# Patient Record
Sex: Female | Born: 1955
Health system: Southern US, Community
[De-identification: ages and names within clinical notes are randomized; demographics above are authoritative.]

## PROBLEM LIST (undated history)

## (undated) DIAGNOSIS — I5042 Chronic combined systolic (congestive) and diastolic (congestive) heart failure: Secondary | ICD-10-CM

## (undated) DIAGNOSIS — Z9109 Other allergy status, other than to drugs and biological substances: Secondary | ICD-10-CM

## (undated) DIAGNOSIS — J309 Allergic rhinitis, unspecified: Secondary | ICD-10-CM

## (undated) DIAGNOSIS — F419 Anxiety disorder, unspecified: Secondary | ICD-10-CM

## (undated) DIAGNOSIS — E785 Hyperlipidemia, unspecified: Secondary | ICD-10-CM

## (undated) DIAGNOSIS — Z95810 Presence of automatic (implantable) cardiac defibrillator: Secondary | ICD-10-CM

## (undated) DIAGNOSIS — F32A Depression, unspecified: Secondary | ICD-10-CM

## (undated) DIAGNOSIS — I447 Left bundle-branch block, unspecified: Secondary | ICD-10-CM

## (undated) DIAGNOSIS — F338 Other recurrent depressive disorders: Secondary | ICD-10-CM

## (undated) DIAGNOSIS — I428 Other cardiomyopathies: Secondary | ICD-10-CM

## (undated) DIAGNOSIS — G4733 Obstructive sleep apnea (adult) (pediatric): Secondary | ICD-10-CM

## (undated) DIAGNOSIS — F329 Major depressive disorder, single episode, unspecified: Secondary | ICD-10-CM

## (undated) DIAGNOSIS — B019 Varicella without complication: Secondary | ICD-10-CM

## (undated) DIAGNOSIS — F988 Other specified behavioral and emotional disorders with onset usually occurring in childhood and adolescence: Secondary | ICD-10-CM

## (undated) DIAGNOSIS — I509 Heart failure, unspecified: Secondary | ICD-10-CM

## (undated) DIAGNOSIS — Z8639 Personal history of other endocrine, nutritional and metabolic disease: Secondary | ICD-10-CM

## (undated) HISTORY — DX: Major depressive disorder, single episode, unspecified: F32.9

## (undated) HISTORY — DX: Other cardiomyopathies: I42.8

## (undated) HISTORY — DX: Obstructive sleep apnea (adult) (pediatric): G47.33

## (undated) HISTORY — DX: Varicella without complication: B01.9

## (undated) HISTORY — DX: Other allergy status, other than to drugs and biological substances: Z91.09

## (undated) HISTORY — DX: Allergic rhinitis, unspecified: J30.9

## (undated) HISTORY — PX: CARDIAC CATHETERIZATION: SHX172

## (undated) HISTORY — DX: Left bundle-branch block, unspecified: I44.7

## (undated) HISTORY — PX: TUBAL LIGATION: SHX77

## (undated) HISTORY — DX: Other recurrent depressive disorders: F33.8

## (undated) HISTORY — DX: Chronic combined systolic (congestive) and diastolic (congestive) heart failure: I50.42

## (undated) HISTORY — DX: Personal history of other endocrine, nutritional and metabolic disease: Z86.39

## (undated) HISTORY — DX: Other specified behavioral and emotional disorders with onset usually occurring in childhood and adolescence: F98.8

## (undated) HISTORY — DX: Depression, unspecified: F32.A

## (undated) HISTORY — DX: Hyperlipidemia, unspecified: E78.5

---

## 1984-10-02 HISTORY — PX: WISDOM TOOTH EXTRACTION: SHX21

## 1998-07-26 ENCOUNTER — Emergency Department (HOSPITAL_COMMUNITY): Admission: EM | Admit: 1998-07-26 | Discharge: 1998-07-26 | Payer: Self-pay

## 1998-07-27 ENCOUNTER — Inpatient Hospital Stay (HOSPITAL_COMMUNITY): Admission: AD | Admit: 1998-07-27 | Discharge: 1998-08-02 | Payer: Self-pay | Admitting: *Deleted

## 1998-08-03 ENCOUNTER — Encounter (HOSPITAL_COMMUNITY): Admission: RE | Admit: 1998-08-03 | Discharge: 1998-11-01 | Payer: Self-pay | Admitting: Internal Medicine

## 2001-10-02 HISTORY — PX: DILATION AND CURETTAGE OF UTERUS: SHX78

## 2002-10-02 HISTORY — PX: TOTAL ABDOMINAL HYSTERECTOMY: SHX209

## 2013-09-05 ENCOUNTER — Encounter: Payer: Self-pay | Admitting: Physician Assistant

## 2013-09-05 ENCOUNTER — Ambulatory Visit (INDEPENDENT_AMBULATORY_CARE_PROVIDER_SITE_OTHER): Payer: Self-pay | Admitting: Physician Assistant

## 2013-09-05 VITALS — BP 108/78 | HR 79 | Temp 98.5°F | Resp 16 | Ht 65.5 in | Wt 206.8 lb

## 2013-09-05 DIAGNOSIS — Z0000 Encounter for general adult medical examination without abnormal findings: Secondary | ICD-10-CM

## 2013-09-05 DIAGNOSIS — F988 Other specified behavioral and emotional disorders with onset usually occurring in childhood and adolescence: Secondary | ICD-10-CM

## 2013-09-05 DIAGNOSIS — Z9109 Other allergy status, other than to drugs and biological substances: Secondary | ICD-10-CM

## 2013-09-05 MED ORDER — LISDEXAMFETAMINE DIMESYLATE 40 MG PO CAPS: 40.0000 mg | ORAL_CAPSULE | ORAL | Status: DC

## 2013-09-05 MED ORDER — MOMETASONE FUROATE 50 MCG/ACT NA SUSP: 2.0000 | Freq: Every day | NASAL | Status: DC

## 2013-09-05 MED ORDER — AMPHETAMINE-DEXTROAMPHETAMINE 10 MG PO TABS: 10.0000 mg | ORAL_TABLET | Freq: Every day | ORAL | Status: DC

## 2013-09-05 NOTE — Progress Notes (Signed)
Pre visit review using our clinic review tool, if applicable. No additional management support is needed unless otherwise documented below in the visit note/SLS  

## 2013-09-05 NOTE — Patient Instructions (Signed)
Please return for lab work.  I will call you with all of your results.  You will be contacted to set up a mammogram.  I would encourage you to reconsider having a colonoscopy.  It is a painless procedure and is a great screening tool to help detect precancerous lesions in the colon.  Colonoscopy A colonoscopy is an exam to evaluate your entire colon. In this exam, your colon is cleansed. A long fiberoptic tube is inserted through your rectum and into your colon. The fiberoptic scope (endoscope) is a long bundle of enclosed and very flexible fibers. These fibers transmit light to the area examined and send images from that area to your caregiver. Discomfort is usually minimal. You may be given a drug to help you sleep (sedative) during or prior to the procedure. This exam helps to detect lumps (tumors), polyps, inflammation, and areas of bleeding. Your caregiver may also take a small piece of tissue (biopsy) that will be examined under a microscope. LET YOUR CAREGIVER KNOW ABOUT:   Allergies to food or medicine.  Medicines taken, including vitamins, herbs, eyedrops, over-the-counter medicines, and creams.  Use of steroids (by mouth or creams).  Previous problems with anesthetics or numbing medicines.  History of bleeding problems or blood clots.  Previous surgery.  Other health problems, including diabetes and kidney problems.  Possibility of pregnancy, if this applies. BEFORE THE PROCEDURE   A clear liquid diet may be required for 2 days before the exam.  Ask your caregiver about changing or stopping your regular medications.  Liquid injections (enemas) or laxatives may be required.  A large amount of electrolyte solution may be given to you to drink over a short period of time. This solution is used to clean out your colon.  You should be present 60 minutes prior to your procedure or as directed by your caregiver. AFTER THE PROCEDURE   If you received a sedative or pain relieving  medication, you will need to arrange for someone to drive you home.  Occasionally, there is a little blood passed with the first bowel movement. Do not be concerned. FINDING OUT THE RESULTS OF YOUR TEST Not all test results are available during your visit. If your test results are not back during the visit, make an appointment with your caregiver to find out the results. Do not assume everything is normal if you have not heard from your caregiver or the medical facility. It is important for you to follow up on all of your test results. HOME CARE INSTRUCTIONS   It is not unusual to pass moderate amounts of gas and experience mild abdominal cramping following the procedure. This is due to air being used to inflate your colon during the exam. Walking or a warm pack on your belly (abdomen) may help.  You may resume all normal meals and activities after sedatives and medicines have worn off.  Only take over-the-counter or prescription medicines for pain, discomfort, or fever as directed by your caregiver. Do not use aspirin or blood thinners if a biopsy was taken. Consult your caregiver for medicine usage if biopsies were taken. SEEK IMMEDIATE MEDICAL CARE IF:   You have a fever.  You pass large blood clots or fill a toilet with blood following the procedure. This may also occur 10 to 14 days following the procedure. This is more likely if a biopsy was taken.  You develop abdominal pain that keeps getting worse and cannot be relieved with medicine. Document Released: 09/15/2000 Document  Revised: 12/11/2011 Document Reviewed: 04/21/2013 [facility] Patient Information 2014 [place], [place].

## 2013-09-05 NOTE — Progress Notes (Signed)
Patient ID: Debra Holloway, female   DOB: 10/01/1957, 57 y.o.   MRN: [id number]  Patient presents to clinic today to establish care.  Acute Concerns: Patient requesting medication refill.  Chronic Issues: (1) ADD --  patient endorses symptoms are controlled with Diovan 40 mg QAM and Adderall 10 mg QPM.  States she only takes medication during the work week. Takes holiday on weekends. Patient denies palpitations, chest pain or shortness of breath. Denies decrease in appetite while medications.  (2) Environmental Allergies -- patient also endorses history of environmental allergies for which she takes Nasonex and Singulair daily.  Endorses good relief of symptoms.  Health Maintenance: Dental -- UTD Vision -- UTD Immunizations -- Declines flu shot.  UTD tetanus.  Mammogram -- overdue.  Needs mammogram scheduled PAP -- TAH. No history of abnormal PAP smears Colonoscopy -- overdue.  Discussed flexible sigmoidoscopy.  Past Medical History  Diagnosis Date  . ADD (attention deficit disorder)   . Environmental allergies   . Chicken pox     No current outpatient prescriptions on file prior to visit.   No current facility-administered medications on file prior to visit.    Allergies  Allergen Reactions  . Bee Venom Anaphylaxis  . Erythromycin Itching and Rash  . Penicillins Hives, Itching and Rash    Family History  Problem Relation Age of Onset  . COPD Mother     Living  . Pancreatic cancer Father 23    Deceased  . Lung cancer Paternal Grandfather   . Heart disease Paternal Grandmother   . Hypertension Paternal Grandmother   . Alzheimer's disease Maternal Grandfather   . Arthritis/Rheumatoid Maternal Grandmother   . Heart disease Maternal Grandmother     Tachycardia  . Cancer Paternal Uncle   . Breast cancer Sister 41  . Healthy Brother     x3  . Healthy Daughter     x3    History   Social History  . Marital Status: Legally Separated    Spouse Name: N/A    Number  of Children: N/A  . Years of Education: N/A   Social History Main Topics  . Smoking status: Never Smoker   . Smokeless tobacco: Never Used  . Alcohol Use: 2.4 oz/week    4 Glasses of wine per week  . Drug Use: No  . Sexual Activity: None   Other Topics Concern  . None   Social History Narrative  . None   Review of Systems  Constitutional: Negative for fever and weight loss.  HENT: Negative for ear discharge, ear pain, hearing loss and tinnitus.   Eyes: Negative for blurred vision, double vision, photophobia and pain.  Respiratory: Negative for cough and shortness of breath.   Cardiovascular: Negative for chest pain.  Gastrointestinal: Negative for heartburn, nausea, vomiting, abdominal pain, diarrhea, constipation, blood in stool and melena.  Genitourinary: Negative for dysuria, urgency, frequency, hematuria and flank pain.  Musculoskeletal: Negative for joint pain and myalgias.  Neurological: Negative for dizziness, seizures, loss of consciousness and headaches.  Endo/Heme/Allergies: Negative for environmental allergies.  Psychiatric/Behavioral: Negative for depression, suicidal ideas, hallucinations and substance abuse. The patient is not nervous/anxious and does not have insomnia.        Situational depression -- 1998   Filed Vitals:   09/05/13 0835  BP: 108/78  Pulse: 79  Temp: 98.5 F (36.9 C)  Resp: 16    Physical Exam  Vitals reviewed. Constitutional: She is oriented to person, place, and time and well-developed, well-nourished, and  in no distress.  HENT:  Head: Normocephalic and atraumatic.  Right Ear: External ear normal.  Left Ear: External ear normal.  Nose: Nose normal.  Mouth/Throat: Oropharynx is clear and moist. No oropharyngeal exudate.  Tympanic membranes within normal limits bilaterally.  Eyes: Conjunctivae and EOM are normal. Pupils are equal, round, and reactive to light.  Neck: Normal range of motion. Neck supple. No thyromegaly present.   Cardiovascular: Normal rate, regular rhythm, normal heart sounds and intact distal pulses.   Pulmonary/Chest: Effort normal and breath sounds normal. No respiratory distress. She has no wheezes. She has no rales. She exhibits no tenderness.  Abdominal: Soft. Bowel sounds are normal. She exhibits no distension and no mass. There is no tenderness. There is no rebound and no guarding.  Lymphadenopathy:    She has no cervical adenopathy.  Neurological: She is alert and oriented to person, place, and time. No cranial nerve deficit.  Skin: Skin is warm and dry. No rash noted.  Psychiatric: Affect normal.   Assessment/Plan: Visit for preventive health examination Medical history reviewed personally. Patient declines flu shot. Up-to-date on tetanus.  Discussed need for colonoscopy. Patient refuses. Patient given handout on colonoscopy to review.  We'll readdress need for screening at next visit. Patient due for a screening mammogram. Positive family history of breast cancer.  Patient with history of abdominal hysterectomy including cervix. No history of cervical cancer. No need for Pap smear at today's visit.  Patient to return for fasting labs.  Environmental allergies Refill medications.  ADD (attention deficit disorder) Medications refill. Patient signed controlled substance contract.

## 2013-09-06 DIAGNOSIS — Z9109 Other allergy status, other than to drugs and biological substances: Secondary | ICD-10-CM | POA: Insufficient documentation

## 2013-09-06 DIAGNOSIS — F988 Other specified behavioral and emotional disorders with onset usually occurring in childhood and adolescence: Secondary | ICD-10-CM | POA: Insufficient documentation

## 2013-09-06 DIAGNOSIS — F909 Attention-deficit hyperactivity disorder, unspecified type: Secondary | ICD-10-CM | POA: Insufficient documentation

## 2013-09-06 DIAGNOSIS — Z0000 Encounter for general adult medical examination without abnormal findings: Secondary | ICD-10-CM | POA: Insufficient documentation

## 2013-09-06 NOTE — Assessment & Plan Note (Signed)
Refill medications

## 2013-09-06 NOTE — Assessment & Plan Note (Signed)
Medications refill. Patient signed controlled substance contract.

## 2013-09-06 NOTE — Assessment & Plan Note (Addendum)
Medical history reviewed personally. Patient declines flu shot. Up-to-date on tetanus.  Discussed need for colonoscopy. Patient refuses. Patient given handout on colonoscopy to review.  We'll readdress need for screening at next visit. Patient due for a screening mammogram. Positive family history of breast cancer.  Patient with history of abdominal hysterectomy including cervix. No history of cervical cancer. No need for Pap smear at today's visit.  Patient to return for fasting labs.

## 2013-09-10 ENCOUNTER — Ambulatory Visit (HOSPITAL_BASED_OUTPATIENT_CLINIC_OR_DEPARTMENT_OTHER): Payer: Self-pay

## 2013-09-22 ENCOUNTER — Telehealth: Payer: Self-pay | Admitting: Physician Assistant

## 2013-09-22 NOTE — Telephone Encounter (Signed)
Received medical records from [facility]. [place]  P: [phone] F: [phone]

## 2013-10-06 ENCOUNTER — Telehealth: Payer: Self-pay | Admitting: Physician Assistant

## 2013-10-06 DIAGNOSIS — Z0000 Encounter for general adult medical examination without abnormal findings: Secondary | ICD-10-CM

## 2013-10-06 MED ORDER — AMPHETAMINE-DEXTROAMPHETAMINE 10 MG PO TABS: 10.0000 mg | ORAL_TABLET | Freq: Every day | ORAL | Status: DC

## 2013-10-06 NOTE — Telephone Encounter (Signed)
Please advise Adderall refill?  Last RX was done on 09-05-13 quantity 30 with 0 refills

## 2013-10-06 NOTE — Telephone Encounter (Signed)
LMOM with contact name and number RE: Requested Rx ready for pick-up/SLS

## 2013-10-22 ENCOUNTER — Telehealth: Payer: Self-pay | Admitting: Physician Assistant

## 2013-10-22 NOTE — Telephone Encounter (Signed)
PA form received for Vyvanse 40mg , form forward to nurse

## 2013-10-27 NOTE — Telephone Encounter (Signed)
Patient called in wanting to know about this.

## 2013-10-27 NOTE — Telephone Encounter (Signed)
Per provider this form was completed and faxed for approval last week/SLS

## 2013-10-30 NOTE — Telephone Encounter (Signed)
Received Approval for pt's Vyvanse; forward fax to [facility] [phone]; Patient Unavailable, LM with mother/SLS

## 2013-10-30 NOTE — Telephone Encounter (Signed)
Pharmacy called for status on PA-No response from Insurance on PA request from 01.21.15; resubmitted 'Second Request' fax for PA approval ; LMOM to inform pt/pharmacy of status/SLS

## 2013-11-06 ENCOUNTER — Other Ambulatory Visit: Payer: Self-pay | Admitting: Physician Assistant

## 2013-11-06 DIAGNOSIS — Z0000 Encounter for general adult medical examination without abnormal findings: Secondary | ICD-10-CM

## 2013-11-06 MED ORDER — AMPHETAMINE-DEXTROAMPHETAMINE 10 MG PO TABS: 10.0000 mg | ORAL_TABLET | Freq: Every day | ORAL | Status: DC

## 2013-11-13 ENCOUNTER — Telehealth: Payer: Self-pay | Admitting: Physician Assistant

## 2013-11-13 NOTE — Telephone Encounter (Signed)
Received PA for D-Amphetamine Salt Combo, form forward to nurse

## 2013-11-21 NOTE — Telephone Encounter (Signed)
Received Approval from [employer]; fax to [employer]

## 2013-11-21 NOTE — Telephone Encounter (Signed)
PA Form faxed to [facility] for Approval/SLS

## 2013-12-01 ENCOUNTER — Telehealth: Payer: Self-pay | Admitting: Physician Assistant

## 2013-12-01 ENCOUNTER — Telehealth: Payer: Self-pay

## 2013-12-01 DIAGNOSIS — Z0000 Encounter for general adult medical examination without abnormal findings: Secondary | ICD-10-CM

## 2013-12-01 MED ORDER — LISDEXAMFETAMINE DIMESYLATE 40 MG PO CAPS: 40.0000 mg | ORAL_CAPSULE | ORAL | Status: DC

## 2013-12-01 NOTE — Telephone Encounter (Signed)
Last Vyvanse refill in EMR 12.05.14, #30x0/SLS Please Advise.

## 2013-12-01 NOTE — Telephone Encounter (Signed)
Medication refill granted.  Rx printed, signed and given to nursing staff.  Ivin Booty and I will not be at [facility] until Wednesday.

## 2013-12-01 NOTE — Telephone Encounter (Signed)
LMOM with contact name and number [for return call, if needed] RE: Rx requested ready for p/u on Tuesday, 03.03.15 at [facility] office between 7a-7p/SLS

## 2013-12-01 NOTE — Telephone Encounter (Signed)
Patient left a message on my vm stating that she needs a refill on her Vyvanse. Pt stated you can reach her at work at [phone]

## 2013-12-18 ENCOUNTER — Telehealth: Payer: Self-pay | Admitting: Physician Assistant

## 2013-12-18 DIAGNOSIS — Z0000 Encounter for general adult medical examination without abnormal findings: Secondary | ICD-10-CM

## 2013-12-19 ENCOUNTER — Telehealth: Payer: Self-pay

## 2013-12-19 NOTE — Telephone Encounter (Signed)
Medication Detail      Disp Refills Start End     lisdexamfetamine (VYVANSE) 40 MG capsule 30 capsule 0 12/01/2013     Sig - Route: Take 1 capsule (40 mg total) by mouth every morning. - Oral    Class: Print       Patient picked up & signed for Vyvanse 40 mg Rx at front desk on 03.03.15; lmom with contact name & number to inform patient of this information/SLS

## 2013-12-19 NOTE — Telephone Encounter (Signed)
Pt left a message stating that she needs a refill on her vyvanse. Pt stated that she doesn't know why the mychart says march that she has an rx because she doesn't. Pt stated that another medication was mixed up awhile ago and maybe this is were the confusion is?  Please advise?

## 2013-12-19 NOTE — Telephone Encounter (Signed)
Pt left another (not nice) message on my vm stating that she doesn't want her vyvanse refilled she wants her Adderall refilled? Please advise?

## 2013-12-22 NOTE — Telephone Encounter (Signed)
Pt left another message stating that this is the 3rd time she has called and would appreciate a call back.  Per Sharon's phone call earlier to me, she is going to call pt back this afternoon.

## 2013-12-22 NOTE — Telephone Encounter (Signed)
LMOM with contact name and number for return call RE: Adderall scripts for February & March given together in February 05.2015; these were placed in one envelope and our Medication Log shows that you did sign for & p/u this envelope. We cannot give controlled medications early, and this is stated in the Controlled Substance Contract. Pt was informed to call back with any further information regarding this matter that we are unaware of; also, informed that I am out of office this week, but will be checking my notes and provider is out of office until Wed, 03.25.15/SLS  

## 2013-12-22 NOTE — Telephone Encounter (Signed)
I will call patient and explain that she was given Feb & March Rx scripts on her Adderall previously at same time & they were signed for/SLS

## 2013-12-22 NOTE — Telephone Encounter (Signed)
LMOM with contact name and number for return call RE: Adderall scripts for February & March given together in February 05.2015; these were placed in one envelope and our Medication Log shows that you did sign for & p/u this envelope. We cannot give controlled medications early, and this is stated in the Controlled Substance Contract. Pt was informed to call back with any further information regarding this matter that we are unaware of; also, informed that I am out of office this week, but will be checking my notes and provider is out of office until Wed, 03.25.15/SLS

## 2014-01-01 ENCOUNTER — Telehealth: Payer: Self-pay

## 2014-01-01 NOTE — Telephone Encounter (Signed)
Pt would like a refill on her vyvanse?

## 2014-01-05 ENCOUNTER — Other Ambulatory Visit: Payer: Self-pay | Admitting: Physician Assistant

## 2014-01-05 DIAGNOSIS — Z0000 Encounter for general adult medical examination without abnormal findings: Secondary | ICD-10-CM

## 2014-01-05 MED ORDER — LISDEXAMFETAMINE DIMESYLATE 40 MG PO CAPS: 40.0000 mg | ORAL_CAPSULE | ORAL | Status: DC

## 2014-01-05 NOTE — Telephone Encounter (Signed)
Last rx printed 12/01/13, #30.  Rx printed and forwarded to Provider for signature.

## 2014-01-05 NOTE — Telephone Encounter (Signed)
Rx sent to front desk for pick up. Left detailed message on pt's cell# that rx is ready for pick up and to call if any questions.

## 2014-01-08 NOTE — Telephone Encounter (Signed)
Medication Detail      Disp Refills Start End     lisdexamfetamine (VYVANSE) 40 MG capsule 30 capsule 0 01/05/2014     Sig - Route: Take 1 capsule (40 mg total) by mouth every morning. - Oral    Class: Print

## 2014-01-19 ENCOUNTER — Telehealth: Payer: Self-pay | Admitting: Physician Assistant

## 2014-01-19 MED ORDER — AMPHETAMINE-DEXTROAMPHETAMINE 10 MG PO TABS: 10.0000 mg | ORAL_TABLET | Freq: Every day | ORAL | Status: DC

## 2014-01-19 NOTE — Telephone Encounter (Signed)
Patient request for refill on Adderall 10 mg Last filled - 03.05.15, #30x0 Last AEX - 12.05.15 Next AEX - Needs OV prior to future refills per provider Refill printed  For 30-day only/sls Sent reply message to pt via My Chart to inform pt Rx ready for pick-up/SLS.

## 2014-01-21 ENCOUNTER — Ambulatory Visit (INDEPENDENT_AMBULATORY_CARE_PROVIDER_SITE_OTHER): Payer: 59 | Admitting: Physician Assistant

## 2014-01-21 ENCOUNTER — Telehealth: Payer: Self-pay | Admitting: Physician Assistant

## 2014-01-21 ENCOUNTER — Ambulatory Visit (HOSPITAL_BASED_OUTPATIENT_CLINIC_OR_DEPARTMENT_OTHER)
Admission: RE | Admit: 2014-01-21 | Discharge: 2014-01-21 | Disposition: A | Discharge status: Home / Self Care | Payer: 59 | Source: Ambulatory Visit | Attending: Physician Assistant | Admitting: Physician Assistant

## 2014-01-21 ENCOUNTER — Encounter: Payer: Self-pay | Admitting: Physician Assistant

## 2014-01-21 VITALS — BP 110/86 | HR 76 | Temp 98.5°F | Resp 16 | Ht 65.5 in | Wt 203.2 lb

## 2014-01-21 DIAGNOSIS — J069 Acute upper respiratory infection, unspecified: Secondary | ICD-10-CM | POA: Insufficient documentation

## 2014-01-21 DIAGNOSIS — B9789 Other viral agents as the cause of diseases classified elsewhere: Secondary | ICD-10-CM

## 2014-01-21 DIAGNOSIS — R0781 Pleurodynia: Secondary | ICD-10-CM | POA: Insufficient documentation

## 2014-01-21 DIAGNOSIS — R079 Chest pain, unspecified: Secondary | ICD-10-CM | POA: Insufficient documentation

## 2014-01-21 DIAGNOSIS — W19XXXA Unspecified fall, initial encounter: Secondary | ICD-10-CM | POA: Insufficient documentation

## 2014-01-21 DIAGNOSIS — S2239XA Fracture of one rib, unspecified side, initial encounter for closed fracture: Secondary | ICD-10-CM | POA: Insufficient documentation

## 2014-01-21 DIAGNOSIS — Z9109 Other allergy status, other than to drugs and biological substances: Secondary | ICD-10-CM

## 2014-01-21 MED ORDER — BENZONATATE 100 MG PO CAPS: 100.0000 mg | ORAL_CAPSULE | Freq: Two times a day (BID) | ORAL | Status: DC | PRN

## 2014-01-21 MED ORDER — MOMETASONE FUROATE 50 MCG/ACT NA SUSP: 2.0000 | Freq: Every day | NASAL | Status: DC

## 2014-01-21 MED ORDER — MONTELUKAST SODIUM 10 MG PO TABS: 10.0000 mg | ORAL_TABLET | Freq: Every day | ORAL | Status: DC

## 2014-01-21 NOTE — Assessment & Plan Note (Signed)
Medications refilled.  Take as directed.

## 2014-01-21 NOTE — Telephone Encounter (Signed)
PA Form completed & faxed for Approval to [facility] at [phone]/SLS

## 2014-01-21 NOTE — Progress Notes (Signed)
Patient presents to clinic today c/o sinus pressure and nasal congestion with nonproductive cough x 2 days.  Endorses some fatigue.  Denies sinus pain, ear pain, tooth pain, fever.  Has history of significant seasonal allergies and has been out of Nasonex and Singulair.  Patient also endorses having a fall in [employer] ~ 2 weeks ago, hitting her left side onto the leg of a turned-over chair. Denies trauma to head or other portion of body.  Denies LOC.  Denies LH, dizziness prior to fall.  Denies hx of seizures.  Has had residual left-sided rib pain that has worsened over the past 2 days due to her non-productive cough.  Past Medical History  Diagnosis Date  . ADD (attention deficit disorder)   . Environmental allergies   . Chicken pox     Current Outpatient Prescriptions on File Prior to Visit  Medication Sig Dispense Refill  . amphetamine-dextroamphetamine (ADDERALL) 10 MG tablet Take 1 tablet (10 mg total) by mouth daily. OFFICE VISIT NEEDED PRIOR TO FUTURE REFILLS  30 tablet  0  . lisdexamfetamine (VYVANSE) 40 MG capsule Take 1 capsule (40 mg total) by mouth every morning.  30 capsule  0   No current facility-administered medications on file prior to visit.    Allergies  Allergen Reactions  . Bee Venom Anaphylaxis  . Erythromycin Itching and Rash  . Penicillins Hives, Itching and Rash    Family History  Problem Relation Age of Onset  . COPD Mother     Living  . Pancreatic cancer Father 59    Deceased  . Lung cancer Paternal Grandfather   . Heart disease Paternal Grandmother   . Hypertension Paternal Grandmother   . Alzheimer's disease Maternal Grandfather   . Arthritis/Rheumatoid Maternal Grandmother   . Heart disease Maternal Grandmother     Tachycardia  . Cancer Paternal Uncle   . Breast cancer Sister 25  . Healthy Brother     x3  . Healthy Daughter     x3    History   Social History  . Marital Status: Legally Separated    Spouse Name: N/A    Number of  Children: N/A  . Years of Education: N/A   Social History Main Topics  . Smoking status: Never Smoker   . Smokeless tobacco: Never Used  . Alcohol Use: 2.4 oz/week    4 Glasses of wine per week  . Drug Use: No  . Sexual Activity: None   Other Topics Concern  . None   Social History Narrative  . None   Review of Systems - See HPI.  All other ROS are negative.  BP 110/86  Pulse 76  Temp(Src) 98.5 F (36.9 C) (Oral)  Resp 16  Ht 5' 5.5" (1.664 m)  Wt 203 lb 4 oz (92.194 kg)  BMI 33.30 kg/m2  SpO2 97%  Physical Exam  Vitals reviewed. Constitutional: She is oriented to person, place, and time and well-developed, well-nourished, and in no distress.  HENT:  Head: Normocephalic and atraumatic.  Right Ear: External ear normal.  Left Ear: External ear normal.  Nose: Nose normal.  Mouth/Throat: Oropharynx is clear and moist. No oropharyngeal exudate.  TM within normal limits bilaterally.  No TTP of sinuses noted on examination.  Eyes: Conjunctivae are normal. Pupils are equal, round, and reactive to light.  Neck: Neck supple.  Cardiovascular: Normal rate, regular rhythm, normal heart sounds and intact distal pulses.   Pulmonary/Chest: Effort normal and breath sounds normal. No respiratory distress.  She has no wheezes. She has no rales. She exhibits tenderness.  Lymphadenopathy:    She has no cervical adenopathy.  Neurological: She is alert and oriented to person, place, and time.  Skin: Skin is warm and dry. No rash noted.  Psychiatric: Affect normal.   No results found for this or any previous visit (from the past 2160 hour(s)).  Assessment/Plan: Environmental allergies Medications refilled.  Take as directed.  Viral URI with cough   Increase fluid intake.  Rest.  Saline nasal spray. Mucinex. Humidifier in bedroom. Resume allergy medications.  Rx Tessalon perles for cough  Please call or return to clinic if symptoms are not improving.   Rib pain on left side Will  obtain x-ray due to hx of fall and chest wall tenderness.  No palpable gross abnormality on exam.  Alternate tylenol and ibuprofen.  Topical Aspercreme.  Heating pad to affected area.

## 2014-01-21 NOTE — Telephone Encounter (Signed)
Patient seen in clinic today for viral URI and left-sided rib pain after sustaining fall 2 weeks ago.  X-ray reveals no cardiopulmonary disease, but noted mild fractures in 2 of her lower left ribs.  She should continue care as directed at today's visit.  See is she thinks she needs a stronger pain medications --I believed she mentioned codeine makes her nauseas when we were discussing cough syrups.  If she thinks she can tolerate oral pain pills, I can wirte her an Rx.  IF not, I can try to send in some lidocaine patches for her to apply topically.

## 2014-01-21 NOTE — Progress Notes (Signed)
Pre visit review using our clinic review tool, if applicable. No additional management support is needed unless otherwise documented below in the visit note/SLS  

## 2014-01-21 NOTE — Assessment & Plan Note (Signed)
Increase fluid intake.  Rest.  Saline nasal spray. Mucinex. Humidifier in bedroom. Resume allergy medications.  Rx Tessalon perles for cough  Please call or return to clinic if symptoms are not improving.

## 2014-01-21 NOTE — Patient Instructions (Signed)
For Rib Pain -- Please go downstairs to the 1st floor for Imaging.  I will call you with your results.  Alternate tylenol and ibuprofen if needed for pain.  Apply heat to area for 15 minutes, a few times per day.  You can also apply a topical Aspercreme or Icy Hot to the affected area  For Viral Upper Respiratory symptoms -- Increase fluid intake.  Resume your allergy medications as directed.  Take Tessalon Perles for cough.  Place a humidifier in the bedroom.  Symptoms should continue to improve daily.

## 2014-01-21 NOTE — Telephone Encounter (Signed)
Received form for PA of Nasonex, form forward to nurse

## 2014-01-21 NOTE — Telephone Encounter (Signed)
Requesting results of xray and needs refill on adderall

## 2014-01-21 NOTE — Assessment & Plan Note (Signed)
Will obtain x-ray due to hx of fall and chest wall tenderness.  No palpable gross abnormality on exam.  Alternate tylenol and ibuprofen.  Topical Aspercreme.  Heating pad to affected area.

## 2014-01-21 NOTE — Telephone Encounter (Signed)
Patient received Rx at OV this morning/SLS

## 2014-01-22 NOTE — Telephone Encounter (Signed)
Patient informed, understood & agreed, informed Ok to use Mucinex for expectorant; pt declined pain medication, provider informed/SLS

## 2014-01-22 NOTE — Telephone Encounter (Signed)
LMOM with contact name and number for return call RE: results & medication refill and further provider instructions/SLS

## 2014-01-22 NOTE — Telephone Encounter (Signed)
X-ray reveals fractures of lower left ribs.  These are nondisplaced and do not have a risk of harming her lungs.  This will heal over time.  Continue care as discussed at visit.  I know she mentioned a possible intolerance to codeine when we were discussing her cough.  My options for pain medication are limited.  I can prescribe her a lidocaine patch to help with the pain.  Or we can attempt something like Tramadol.

## 2014-01-22 NOTE — Telephone Encounter (Signed)
Please advise on Xray results/SLS

## 2014-02-02 ENCOUNTER — Telehealth: Payer: Self-pay

## 2014-02-02 NOTE — Telephone Encounter (Signed)
Rx was completed on January 19, 2014 & Office Visit was completed on January 21, 2014.

## 2014-02-02 NOTE — Telephone Encounter (Signed)
Patient left a message stating that she would like to have her RX for her Vyvanse and when she picked up her adderall RX it stated that she would need an appt for additional refills? Pt stated in message that she was told at her April appt that she wouldn't need another appt before the adderall could be refilled?  Please advise?

## 2014-02-04 ENCOUNTER — Telehealth: Payer: Self-pay | Admitting: Family Medicine

## 2014-02-04 DIAGNOSIS — Z0000 Encounter for general adult medical examination without abnormal findings: Secondary | ICD-10-CM

## 2014-02-04 MED ORDER — LISDEXAMFETAMINE DIMESYLATE 40 MG PO CAPS: 40.0000 mg | ORAL_CAPSULE | ORAL | Status: DC

## 2014-02-04 NOTE — Telephone Encounter (Signed)
Last Vyvanse rx printed 01/05/14. Printed Rx and forwarded to Provider for signature as pt would like to pick up rx today.

## 2014-02-09 ENCOUNTER — Telehealth: Payer: Self-pay | Admitting: Physician Assistant

## 2014-02-09 NOTE — Telephone Encounter (Signed)
This Rx was px on 04.22.2015/SLS Please Advise.

## 2014-02-09 NOTE — Telephone Encounter (Signed)
Refill.  No additional refill without follow-up visit.

## 2014-02-09 NOTE — Telephone Encounter (Signed)
Refill- benzonatate  [facility] at [address], [address] from pharmacy: This is currently on backorder. Patient received the first 10 tablets. Please call or fax in alternative.

## 2014-02-09 NOTE — Telephone Encounter (Signed)
LMOM with contact name and number for return call RE: Appointment needed per provider instructions/SLS

## 2014-02-09 NOTE — Telephone Encounter (Signed)
Patient was given 44-months Rx scripts for May, June & July 2015/SLS

## 2014-02-10 NOTE — Telephone Encounter (Signed)
Received message from pt that she has not requested refill on any medications. States the pharmacy was only able to give her a partial fill of the benzonatate from 01/21/14 as it was on backorder. Pt states she paid for the full Rx and had contacted pharmacy to get the remainder of it as she feels she has already paid for the full Rx.  Received message from Sam @ [facility] at 11:33am stating we should disregard previous fax re: benzonatate substitute. He states pt is not asking for refill or alternative at this time.

## 2014-02-13 ENCOUNTER — Encounter: Payer: Self-pay | Admitting: Physician Assistant

## 2014-02-18 ENCOUNTER — Telehealth: Payer: Self-pay | Admitting: *Deleted

## 2014-02-18 NOTE — Telephone Encounter (Signed)
Pt left message requesting refill of adderall.  Please advise.

## 2014-02-19 MED ORDER — AMPHETAMINE-DEXTROAMPHETAMINE 10 MG PO TABS: 10.0000 mg | ORAL_TABLET | Freq: Every day | ORAL | Status: DC

## 2014-02-19 NOTE — Telephone Encounter (Signed)
Three months of Rx for Adderall print, signed, & ready for p/u at front desk; patient informed, understood & agreed; reiterated office hours/SLS

## 2014-03-05 ENCOUNTER — Encounter: Payer: Self-pay | Admitting: Physician Assistant

## 2014-05-03 ENCOUNTER — Other Ambulatory Visit: Payer: Self-pay | Admitting: Physician Assistant

## 2014-05-03 DIAGNOSIS — Z0000 Encounter for general adult medical examination without abnormal findings: Secondary | ICD-10-CM

## 2014-05-04 MED ORDER — LISDEXAMFETAMINE DIMESYLATE 40 MG PO CAPS: 40.0000 mg | ORAL_CAPSULE | ORAL | Status: DC

## 2014-05-04 NOTE — Telephone Encounter (Signed)
lisdexamfetamine (VYVANSE) 40 MG capsule [[id number]]    Order Details     Dose: 40 mg Route: Oral Frequency: BH-each morning    Order Comments:       FILL ON OR AFTER April 06, 2014       Dispense Quantity: 30 capsule Refills: 0 Fills Remaining: 0            Sig: Take 1 capsule (40 mg total) by mouth every morning.           Written Date: 02/04/14 Expiration Date: 04/05/14      Start Date: 02/04/14 End Date: --      Ordering Provider: Leeanne Rio, [place] Authorizing Provider: Leeanne Rio, PA-C Ordering User: Rockwell Germany, CMA            Diagnosis Association: Visit for preventive health examination (V70.0)    Per VO provider, Ok to print Rx for patient; can fill On or After 08.08.15, per pharmacy patient last filled script on 07.09.15/SLS

## 2014-05-08 ENCOUNTER — Other Ambulatory Visit: Payer: Self-pay | Admitting: Physician Assistant

## 2014-05-08 DIAGNOSIS — Z0000 Encounter for general adult medical examination without abnormal findings: Secondary | ICD-10-CM

## 2014-05-08 MED ORDER — LISDEXAMFETAMINE DIMESYLATE 40 MG PO CAPS: 40.0000 mg | ORAL_CAPSULE | ORAL | Status: DC

## 2014-05-08 MED ORDER — LISDEXAMFETAMINE DIMESYLATE 40 MG PO CAPS
40.0000 mg | ORAL_CAPSULE | ORAL | Status: DC
Start: 2014-05-08 — End: 2014-05-08

## 2014-06-10 ENCOUNTER — Telehealth: Payer: Self-pay | Admitting: Physician Assistant

## 2014-06-10 NOTE — Telephone Encounter (Signed)
Patient called in stating that her brother was going to pick up her handwritten rx.

## 2014-06-11 ENCOUNTER — Other Ambulatory Visit: Payer: Self-pay | Admitting: Physician Assistant

## 2014-06-12 MED ORDER — AMPHETAMINE-DEXTROAMPHETAMINE 10 MG PO TABS: 10.0000 mg | ORAL_TABLET | Freq: Every day | ORAL | Status: DC

## 2014-06-19 ENCOUNTER — Encounter: Payer: Self-pay | Admitting: Physician Assistant

## 2014-06-19 ENCOUNTER — Ambulatory Visit (INDEPENDENT_AMBULATORY_CARE_PROVIDER_SITE_OTHER): Payer: 59 | Admitting: Physician Assistant

## 2014-06-19 VITALS — BP 120/84 | HR 64 | Temp 98.6°F | Ht 65.5 in | Wt 203.0 lb

## 2014-06-19 DIAGNOSIS — F988 Other specified behavioral and emotional disorders with onset usually occurring in childhood and adolescence: Secondary | ICD-10-CM

## 2014-06-19 NOTE — Patient Instructions (Signed)
Please continue your medications as directed.  You are due for a annual exam and fasting labs in December of this year.  Please read information below on mammograms and colonoscopies.  I would highly encourage you to let us set you up for these.  Colonoscopy A colonoscopy is an exam to look at the entire large intestine (colon). This exam can help find problems such as tumors, polyps, inflammation, and areas of bleeding. The exam takes about 1 hour.  LET [facility] CARE PROVIDER KNOW ABOUT:   Any allergies you have.  All medicines you are taking, including vitamins, herbs, eye drops, creams, and over-the-counter medicines.  Previous problems you or members of your family have had with the use of anesthetics.  Any blood disorders you have.  Previous surgeries you have had.  Medical conditions you have. RISKS AND COMPLICATIONS  Generally, this is a safe procedure. However, as with any procedure, complications can occur. Possible complications include:  Bleeding.  Tearing or rupture of the colon wall.  Reaction to medicines given during the exam.  Infection (rare). BEFORE THE PROCEDURE   Ask your health care provider about changing or stopping your regular medicines.  You may be prescribed an oral bowel prep. This involves drinking a large amount of medicated liquid, starting the day before your procedure. The liquid will cause you to have multiple loose stools until your stool is almost clear or light green. This cleans out your colon in preparation for the procedure.  Do not eat or drink anything else once you have started the bowel prep, unless your health care provider tells you it is safe to do so.  Arrange for someone to drive you home after the procedure. PROCEDURE   You will be given medicine to help you relax (sedative).  You will lie on your side with your knees bent.  A long, flexible tube with a light and camera on the end (colonoscope) will be inserted through the  rectum and into the colon. The camera sends video back to a computer screen as it moves through the colon. The colonoscope also releases carbon dioxide gas to inflate the colon. This helps your health care provider see the area better.  During the exam, your health care provider may take a small tissue sample (biopsy) to be examined under a microscope if any abnormalities are found.  The exam is finished when the entire colon has been viewed. AFTER THE PROCEDURE   Do not drive for 24 hours after the exam.  You may have a small amount of blood in your stool.  You may pass moderate amounts of gas and have mild abdominal cramping or bloating. This is caused by the gas used to inflate your colon during the exam.  Ask when your test results will be ready and how you will get your results. Make sure you get your test results. Document Released: 09/15/2000 Document Revised: 07/09/2013 Document Reviewed: 05/26/2013 [facility] Patient Information 2015 [place], [place]. This information is not intended to replace advice given to you by your health care provider. Make sure you discuss any questions you have with your health care provider.  Mammography  Mammography is an X-ray of the breasts. The X-ray image of the breast is called a mammogram. This test can:  Find changes in the breast that are not normal.  Look for early signs of cancer.  Find cancer.  Diagnose cancer. BEFORE THE PROCEDURE  Make your test about 7 days after your period (menses).  If you have a new doctor or clinic, send any past mammogram images to your new doctor's office.  Wash your breasts and under your arms the day of the test.  Do not wear deodorant, perfume, or powder on your body.  Wear clothes that you can change in and out of easily. PROCEDURE  Try to relax during the test.   You will get undressed from the waist up. You will put on a gown.  You will stand in front of the X-ray machine.  Each breast will be  placed between 2 plastic or glass plates. The plates will press down on your breast.  X-rays will be taken of the breast from different angles. The test should take less than 30 minutes. AFTER THE PROCEDURE  The X-ray image will be looked at carefully.  You may need to do certain parts of the test again if the images are unclear.  Ask when your test results will be ready. Get your test results.  You may go back to your normal activities. Document Released: 12/11/2011 Document Reviewed: 12/11/2011 [facility] Patient Information 2015 [place]. This information is not intended to replace advice given to you by your health care provider. Make sure you discuss any questions you have with your health care provider.

## 2014-06-19 NOTE — Progress Notes (Signed)
Patient presents to clinic today for medication management.  Patient currently on Adderall and Vyvanse for ADD.  Patient has been well controlled on current regimen.  Denies palpitations, chest pain, insomnia or anorexia with medication.  Has no other concerns today.  Patient also due for mammogram, colonoscopy and flu shot.  Patient declines all three. Also declines taking IFOB home in lieu of colonoscopy.  She is aware this is against medical advice  Past Medical History  Diagnosis Date  . ADD (attention deficit disorder)   . Environmental allergies   . Chicken pox   . H/O vitamin D deficiency   . Seasonal affective disorder   . LBBB (left bundle branch block)     W/1st degree AV Block, Avoid Beta Blockers  . AR (allergic rhinitis)   . Depression   . Dyslipidemia     Current Outpatient Prescriptions on File Prior to Visit  Medication Sig Dispense Refill  . amphetamine-dextroamphetamine (ADDERALL) 10 MG tablet Take 1 tablet (10 mg total) by mouth daily. Fill on or after 06/12/2014  30 tablet  0  . fexofenadine-pseudoephedrine (ALLEGRA-D 24) 180-240 MG per 24 hr tablet Take 1 tablet by mouth daily.      Marland Kitchen lisdexamfetamine (VYVANSE) 40 MG capsule Take 1 capsule (40 mg total) by mouth every morning.  30 capsule  0  . mometasone (NASONEX) 50 MCG/ACT nasal spray Place 2 sprays into the nose daily.  17 g  3  . montelukast (SINGULAIR) 10 MG tablet Take 1 tablet (10 mg total) by mouth at bedtime.  90 tablet  1   No current facility-administered medications on file prior to visit.    Allergies  Allergen Reactions  . Bee Venom Anaphylaxis  . Erythromycin Itching and Rash  . Penicillins Hives, Itching and Rash    Family History  Problem Relation Age of Onset  . COPD Mother     Living  . Pancreatic cancer Father 65    Deceased  . Lung cancer Paternal Grandfather   . Heart disease Paternal Grandmother   . Hypertension Paternal Grandmother   . Alzheimer's disease Maternal  Grandfather   . Arthritis/Rheumatoid Maternal Grandmother   . Heart disease Maternal Grandmother     Tachycardia  . Cancer Paternal Uncle   . Breast cancer Sister 51  . Healthy Brother     x3  . Healthy Daughter     x3    History   Social History  . Marital Status: Legally Separated    Spouse Name: N/A    Number of Children: N/A  . Years of Education: N/A   Social History Main Topics  . Smoking status: Never Smoker   . Smokeless tobacco: Never Used  . Alcohol Use: 2.4 oz/week    4 Glasses of wine per week  . Drug Use: No  . Sexual Activity: None   Other Topics Concern  . None   Social History Narrative  . None   Review of Systems - See HPI.  All other ROS are negative.  BP 120/84  Pulse 64  Temp(Src) 98.6 F (37 C) (Oral)  Ht 5' 5.5" (1.664 m)  Wt 203 lb (92.08 kg)  BMI 33.26 kg/m2  SpO2 100%  Physical Exam  Vitals reviewed. Constitutional: She is oriented to person, place, and time and well-developed, well-nourished, and in no distress.  HENT:  Head: Normocephalic and atraumatic.  Eyes: Conjunctivae are normal.  Neck: Neck supple. No thyromegaly present.  Cardiovascular: Normal rate, regular rhythm, normal  heart sounds and intact distal pulses.   Pulmonary/Chest: Effort normal. No respiratory distress. She has no wheezes. She has no rales. She exhibits no tenderness.  Lymphadenopathy:    She has no cervical adenopathy.  Neurological: She is alert and oriented to person, place, and time.  Skin: Skin is warm and dry. No rash noted.  Psychiatric: Affect normal.   No results found for this or any previous visit (from the past 2160 hour(s)).  Assessment/Plan: ADD (attention deficit disorder) Doing well.  Physical exam unremarkable.  Vital signs look good.  Continue current regimen.  Patient to return in 3 months for Annual Examination and fasting labs.  Will readdress Colonoscopy and Mammography at that time.

## 2014-06-19 NOTE — Assessment & Plan Note (Signed)
Doing well.  Physical exam unremarkable.  Vital signs look good.  Continue current regimen.  Patient to return in 3 months for Annual Examination and fasting labs.  Will readdress Colonoscopy and Mammography at that time.

## 2014-07-23 ENCOUNTER — Ambulatory Visit (INDEPENDENT_AMBULATORY_CARE_PROVIDER_SITE_OTHER): Payer: 59 | Admitting: Medical

## 2014-07-23 ENCOUNTER — Encounter: Payer: Self-pay | Admitting: Medical

## 2014-07-23 VITALS — BP 130/75 | HR 80 | Temp 98.6°F | Ht 65.5 in | Wt 198.0 lb

## 2014-07-23 DIAGNOSIS — H10023 Other mucopurulent conjunctivitis, bilateral: Secondary | ICD-10-CM

## 2014-07-23 DIAGNOSIS — L089 Local infection of the skin and subcutaneous tissue, unspecified: Secondary | ICD-10-CM

## 2014-07-23 DIAGNOSIS — H1033 Unspecified acute conjunctivitis, bilateral: Secondary | ICD-10-CM | POA: Insufficient documentation

## 2014-07-23 MED ORDER — DOXYCYCLINE HYCLATE 100 MG PO TABS: 100.0000 mg | ORAL_TABLET | Freq: Two times a day (BID) | ORAL | Status: DC

## 2014-07-23 NOTE — Progress Notes (Signed)
Pre visit review using our clinic review tool, if applicable. No additional management support is needed unless otherwise documented below in the visit note. 

## 2014-07-23 NOTE — Progress Notes (Signed)
Subjective:    Patient ID: Debra Holloway, female    DOB: 1956-07-10, 58 y.o.   MRN: [id number]  HPI  Pt in with redness around both of her eyes. She states eye lids feel swollen. Mild irritation but not itching. No eye drops of any sorts used do to reported severe reaction to variuos components in eye drops. Pt states in past she had eye surgery and had staph infection to both eye regions. Pt states around that time she had allergic reaction to eye drops preservative in drops. Eye irritation yesterday. This morning she had yellowish crusting to both eye lids and lids were sticking together.  Pt is on nasonex, allegra, and montelukast. Pt is chronically nasal congested.  Past Medical History  Diagnosis Date  . ADD (attention deficit disorder)   . Environmental allergies   . Chicken pox   . H/O vitamin D deficiency   . Seasonal affective disorder   . LBBB (left bundle branch block)     W/1st degree AV Block, Avoid Beta Blockers  . AR (allergic rhinitis)   . Depression   . Dyslipidemia     History   Social History  . Marital Status: Legally Separated    Spouse Name: N/A    Number of Children: N/A  . Years of Education: N/A   Occupational History  . Not on file.   Social History Main Topics  . Smoking status: Never Smoker   . Smokeless tobacco: Never Used  . Alcohol Use: 2.4 oz/week    4 Glasses of wine per week  . Drug Use: No  . Sexual Activity: Not on file   Other Topics Concern  . Not on file   Social History Narrative  . No narrative on file    Past Surgical History  Procedure Laterality Date  . Dilation and curettage of uterus  2003  . Abdominal hysterectomy  2004  . Wisdom tooth extraction  1986    Family History  Problem Relation Age of Onset  . COPD Mother     Living  . Pancreatic cancer Father 11    Deceased  . Lung cancer Paternal Grandfather   . Heart disease Paternal Grandmother   . Hypertension Paternal Grandmother   . Alzheimer's disease  Maternal Grandfather   . Arthritis/Rheumatoid Maternal Grandmother   . Heart disease Maternal Grandmother     Tachycardia  . Cancer Paternal Uncle   . Breast cancer Sister 53  . Healthy Brother     x3  . Healthy Daughter     x3    Allergies  Allergen Reactions  . Bee Venom Anaphylaxis  . Erythromycin Itching and Rash  . Penicillins Hives, Itching and Rash    Current Outpatient Prescriptions on File Prior to Visit  Medication Sig Dispense Refill  . fexofenadine-pseudoephedrine (ALLEGRA-D 24) 180-240 MG per 24 hr tablet Take 1 tablet by mouth daily.      Marland Kitchen lisdexamfetamine (VYVANSE) 40 MG capsule Take 1 capsule (40 mg total) by mouth every morning.  30 capsule  0  . mometasone (NASONEX) 50 MCG/ACT nasal spray Place 2 sprays into the nose daily.  17 g  3  . montelukast (SINGULAIR) 10 MG tablet Take 1 tablet (10 mg total) by mouth at bedtime.  90 tablet  1  . amphetamine-dextroamphetamine (ADDERALL) 10 MG tablet Take 1 tablet (10 mg total) by mouth daily. Fill on or after 06/12/2014  30 tablet  0   No current facility-administered medications on file prior  to visit.    BP 130/75  Pulse 80  Temp(Src) 98.6 F (37 C) (Oral)  Ht 5' 5.5" (1.664 m)  Wt 198 lb (89.812 kg)  BMI 32.44 kg/m2  SpO2 97%      Review of Systems  Constitutional: Negative for fever, chills and fatigue.  HENT: Negative for congestion, ear pain, nosebleeds, postnasal drip, rhinorrhea, sinus pressure, sneezing, sore throat and tinnitus.   Eyes: Positive for redness and itching. Negative for photophobia, pain and visual disturbance.       Faint itching but mostly redness around eys. Mild redness to eyes and matting discharge with both eye lids stuck together this am.  Respiratory: Negative for cough, chest tightness, shortness of breath and wheezing.   Musculoskeletal: Negative.   Neurological: Negative for dizziness, syncope, facial asymmetry, speech difficulty, weakness, light-headedness, numbness and  headaches.  Hematological: Negative for adenopathy. Does not bruise/bleed easily.  Psychiatric/Behavioral: Negative.        Objective:   Physical Exam Genral - No actute distress. Lungs- CTA Heart- RRR Neurologic- CNIII- XII grossly intact Eyes-Redness around both eyes. Both lids and approaching temporal areas Not tender to touch. Mild injected conjuntiva. No vesicles around her eyes. No dc presently.No vesicles seen      Assessment & Plan:

## 2014-07-23 NOTE — Assessment & Plan Note (Signed)
Possible periorbital and she describes staph infection in the past to orbital regions so decided on doxycycline.

## 2014-07-23 NOTE — Patient Instructions (Signed)
By your recent description of matting in am and exam today, I will prescribe oral antibiotic for bacterial conjunctivitis. You have the  history of severe staph infection and reaction to eye drops in past. I want to give oral antibiotics in order not to delay treatment. Since the prior compound pharmacy was out of town and if we use local compounding pharmacy to make special drops this could delay treatment.  If your symptoms worsen over then weekend then please go to UC.  Update [place] on Monday am. If your eye appears the same would consider giving low dose course prednisone as you may have allergic component presently. Continue your allergy meds.  Follow up Monday or as needed.

## 2014-07-23 NOTE — Assessment & Plan Note (Signed)
By her matting description this likey the case but sever reactions to eye drops in the past. Do not want to delay treatment trying to get drops compounded. Unsure what component of drops she had reaction to. I feel compounding route would delay tx so went with oral antibiotic.

## 2014-07-27 ENCOUNTER — Encounter: Payer: Self-pay | Admitting: Physician Assistant

## 2014-07-29 NOTE — Telephone Encounter (Signed)
Please Advise

## 2014-09-07 ENCOUNTER — Ambulatory Visit (INDEPENDENT_AMBULATORY_CARE_PROVIDER_SITE_OTHER): Payer: 59 | Admitting: Physician Assistant

## 2014-09-07 ENCOUNTER — Ambulatory Visit: Payer: 59 | Admitting: Physician Assistant

## 2014-09-07 ENCOUNTER — Encounter: Payer: Self-pay | Admitting: Physician Assistant

## 2014-09-07 ENCOUNTER — Telehealth: Payer: Self-pay | Admitting: Physician Assistant

## 2014-09-07 ENCOUNTER — Ambulatory Visit (INDEPENDENT_AMBULATORY_CARE_PROVIDER_SITE_OTHER): Payer: 59 | Admitting: *Deleted

## 2014-09-07 VITALS — BP 133/83 | HR 79 | Temp 98.2°F | Ht 66.0 in | Wt 201.0 lb

## 2014-09-07 DIAGNOSIS — Z23 Encounter for immunization: Secondary | ICD-10-CM

## 2014-09-07 DIAGNOSIS — E559 Vitamin D deficiency, unspecified: Secondary | ICD-10-CM

## 2014-09-07 DIAGNOSIS — Z13820 Encounter for screening for osteoporosis: Secondary | ICD-10-CM

## 2014-09-07 DIAGNOSIS — F988 Other specified behavioral and emotional disorders with onset usually occurring in childhood and adolescence: Secondary | ICD-10-CM

## 2014-09-07 DIAGNOSIS — Z136 Encounter for screening for cardiovascular disorders: Secondary | ICD-10-CM

## 2014-09-07 DIAGNOSIS — Z0000 Encounter for general adult medical examination without abnormal findings: Secondary | ICD-10-CM

## 2014-09-07 DIAGNOSIS — F909 Attention-deficit hyperactivity disorder, unspecified type: Secondary | ICD-10-CM

## 2014-09-07 LAB — CBC
HCT: 38.3 % (ref 36.0–46.0)
Hemoglobin: 12.6 g/dL (ref 12.0–15.0)
MCHC: 32.9 g/dL (ref 30.0–36.0)
MCV: 92.2 fl (ref 78.0–100.0)
Platelets: 229 10*3/uL (ref 150.0–400.0)
RBC: 4.16 Mil/uL (ref 3.87–5.11)
RDW: 14.6 % (ref 11.5–15.5)
WBC: 7.3 10*3/uL (ref 4.0–10.5)

## 2014-09-07 LAB — HEPATIC FUNCTION PANEL
ALT: 14 U/L (ref 0–35)
AST: 17 U/L (ref 0–37)
Albumin: 4.1 g/dL (ref 3.5–5.2)
Alkaline Phosphatase: 50 U/L (ref 39–117)
BILIRUBIN DIRECT: 0.1 mg/dL (ref 0.0–0.3)
BILIRUBIN TOTAL: 0.8 mg/dL (ref 0.2–1.2)
Total Protein: 7.1 g/dL (ref 6.0–8.3)

## 2014-09-07 LAB — BASIC METABOLIC PANEL
BUN: 14 mg/dL (ref 6–23)
CO2: 26 mEq/L (ref 19–32)
Calcium: 9.3 mg/dL (ref 8.4–10.5)
Chloride: 110 mEq/L (ref 96–112)
Creatinine, Ser: 0.8 mg/dL (ref 0.4–1.2)
GFR: 74.29 mL/min (ref >60.00)
Glucose, Bld: 100 mg/dL — ABNORMAL HIGH (ref 70–99)
POTASSIUM: 5.1 meq/L (ref 3.5–5.1)
SODIUM: 142 meq/L (ref 135–145)

## 2014-09-07 LAB — URINALYSIS, ROUTINE W REFLEX MICROSCOPIC
Bilirubin Urine: NEGATIVE
Hgb urine dipstick: NEGATIVE
Ketones, ur: NEGATIVE
Leukocytes, UA: NEGATIVE
Nitrite: NEGATIVE
PH: 5 (ref 5.0–8.0)
RBC / HPF: NONE SEEN (ref 0–?)
TOTAL PROTEIN, URINE-UPE24: NEGATIVE
URINE GLUCOSE: NEGATIVE
UROBILINOGEN UA: 0.2 (ref 0.0–1.0)

## 2014-09-07 LAB — TSH: TSH: 3.75 u[IU]/mL (ref 0.35–4.50)

## 2014-09-07 LAB — LIPID PANEL
CHOLESTEROL: 225 mg/dL — AB (ref 0–200)
HDL: 69.6 mg/dL (ref >39.00)
LDL Cholesterol: 136 mg/dL — ABNORMAL HIGH (ref 0–99)
NonHDL: 155.4
TRIGLYCERIDES: 95 mg/dL (ref 0.0–149.0)
Total CHOL/HDL Ratio: 3
VLDL: 19 mg/dL (ref 0.0–40.0)

## 2014-09-07 LAB — VITAMIN D 25 HYDROXY (VIT D DEFICIENCY, FRACTURES): VITD: 11.97 ng/mL — ABNORMAL LOW (ref 30.00–100.00)

## 2014-09-07 LAB — HEMOGLOBIN A1C: Hgb A1c MFr Bld: 5.6 % (ref 4.6–6.5)

## 2014-09-07 MED ORDER — AMPHETAMINE-DEXTROAMPHETAMINE 10 MG PO TABS
10.0000 mg | ORAL_TABLET | Freq: Every day | ORAL | Status: DC
Start: 2014-09-07 — End: 2014-09-07

## 2014-09-07 MED ORDER — LISDEXAMFETAMINE DIMESYLATE 40 MG PO CAPS: 40.0000 mg | ORAL_CAPSULE | ORAL | Status: DC

## 2014-09-07 MED ORDER — VITAMIN D (ERGOCALCIFEROL) 1.25 MG (50000 UNIT) PO CAPS: 50000.0000 [IU] | ORAL_CAPSULE | ORAL | Status: DC

## 2014-09-07 MED ORDER — AMPHETAMINE-DEXTROAMPHETAMINE 10 MG PO TABS: 10.0000 mg | ORAL_TABLET | Freq: Every day | ORAL | Status: DC

## 2014-09-07 NOTE — Progress Notes (Signed)
Pre visit review using our clinic review tool, if applicable. No additional management support is needed unless otherwise documented below in the visit note. 

## 2014-09-07 NOTE — Progress Notes (Signed)
Patient presents to clinic today for annual exam.  Patient is fasting for labs.  Acute Concerns: No acute concerns at today's visit.  Chronic Issues: Patient continues her Vyvanse and Adderall as directed. Denies adverse reaction. Endorses good focus still with current regimen.  Health Maintenance: Dental -- up-to-date Vision -- up-to-date Immunizations -- Up-to-date on Tetanus.  Will be getting flu shot. Colonoscopy -- Patient refuses screening AMA. Mammogram -- Patient refuses screening Mammogram. PAP -- s/p TAH. No history of abnormal PAP. Bone Density -- Overdue. Patient amenable to this.  Will place order.   Past Medical History  Diagnosis Date  . ADD (attention deficit disorder)   . Environmental allergies   . Chicken pox   . H/O vitamin D deficiency   . Seasonal affective disorder   . LBBB (left bundle branch block)     W/1st degree AV Block, Avoid Beta Blockers  . AR (allergic rhinitis)   . Depression   . Dyslipidemia     Past Surgical History  Procedure Laterality Date  . Dilation and curettage of uterus  2003  . Abdominal hysterectomy  2004  . Wisdom tooth extraction  1986    Current Outpatient Prescriptions on File Prior to Visit  Medication Sig Dispense Refill  . fexofenadine-pseudoephedrine (ALLEGRA-D 24) 180-240 MG per 24 hr tablet Take 1 tablet by mouth daily.    . montelukast (SINGULAIR) 10 MG tablet Take 1 tablet (10 mg total) by mouth at bedtime. 90 tablet 1   No current facility-administered medications on file prior to visit.    Allergies  Allergen Reactions  . Bee Venom Anaphylaxis  . Erythromycin Itching and Rash  . Penicillins Hives, Itching and Rash    Family History  Problem Relation Age of Onset  . COPD Mother     Living  . Pancreatic cancer Father 15    Deceased  . Lung cancer Paternal Grandfather   . Heart disease Paternal Grandmother   . Hypertension Paternal Grandmother   . Alzheimer's disease Maternal Grandfather   .  Arthritis/Rheumatoid Maternal Grandmother   . Heart disease Maternal Grandmother     Tachycardia  . Cancer Paternal Uncle   . Breast cancer Sister 61  . Healthy Brother     x3  . Healthy Daughter     x3    History   Social History  . Marital Status: Legally Separated    Spouse Name: N/A    Number of Children: N/A  . Years of Education: N/A   Occupational History  . Not on file.   Social History Main Topics  . Smoking status: Never Smoker   . Smokeless tobacco: Never Used  . Alcohol Use: 2.4 oz/week    4 Glasses of wine per week  . Drug Use: No  . Sexual Activity: Not on file   Other Topics Concern  . Not on file   Social History Narrative   Review of Systems  Constitutional: Negative for fever and weight loss.  HENT: Negative for ear discharge, ear pain, hearing loss and tinnitus.   Eyes: Negative for blurred vision, double vision, photophobia, pain, discharge and redness.  Respiratory: Negative for cough, shortness of breath and wheezing.   Cardiovascular: Negative for chest pain and palpitations.  Gastrointestinal: Negative for heartburn, nausea, vomiting, abdominal pain, diarrhea, constipation, blood in stool and melena.  Genitourinary: Negative for dysuria, urgency, frequency, hematuria and flank pain.  Neurological: Negative for dizziness, loss of consciousness and headaches.  Endo/Heme/Allergies: Negative for environmental  allergies.  Psychiatric/Behavioral: Negative for depression, suicidal ideas, hallucinations and substance abuse. The patient is not nervous/anxious and does not have insomnia.    BP 133/83 mmHg  Pulse 79  Temp(Src) 98.2 F (36.8 C)  Ht 5\' 6"  (1.676 m)  Wt 201 lb (91.173 kg)  BMI 32.46 kg/m2  SpO2 99%  Physical Exam  Constitutional: She is oriented to person, place, and time and well-developed, well-nourished, and in no distress.  HENT:  Head: Normocephalic and atraumatic.  Right Ear: External ear normal.  Left Ear: External ear  normal.  Nose: Nose normal.  Mouth/Throat: Oropharynx is clear and moist. No oropharyngeal exudate.  TM within normal limits bilaterally.  Eyes: Conjunctivae are normal. Pupils are equal, round, and reactive to light.  Neck: Neck supple. No thyromegaly present.  Cardiovascular: Normal rate, regular rhythm, normal heart sounds and intact distal pulses.   Pulmonary/Chest: Effort normal and breath sounds normal. No respiratory distress. She has no wheezes. She has no rales. She exhibits no tenderness.  Abdominal: Soft. Bowel sounds are normal. She exhibits no distension and no mass. There is no tenderness. There is no rebound and no guarding.  Lymphadenopathy:    She has no cervical adenopathy.  Neurological: She is alert and oriented to person, place, and time. No cranial nerve deficit.  Skin: Skin is warm and dry. No rash noted.  Psychiatric: Affect normal.  Vitals reviewed.  Assessment/Plan: Visit for preventive health examination I have reviewed the patient's medical history in detail and updated the computerized patient record. Patient declines Tetanus.  Flu shot given by nursing staff.  Patient refuses Mammogram or Colonoscopy despite lengthy discussed about benefits of screening.  Patient also refuses IFOB.  Patient is agreeable to bone density scan so this will be ordered today.  Will obtain fasting labs at today's visit.   ADD (attention deficit disorder) Well-controlled. Continue current regimen.  Screening for ischemic heart disease EKG reveals chronic LBBB but no new abnormalities.  Patient with previous extensive Cardiology workup. Asymptomatic. No further intervention necessary at present.  81 mg ASA daily.

## 2014-09-07 NOTE — Patient Instructions (Signed)
Please stop by the lab for blood work.  I will call you with your results.  Continue current medication regimen. Please reconsider mammogram and colonoscopy.  You will be contacted for a scan to screen for osteoporosis.  Preventive Care for Adults A healthy lifestyle and preventive care can promote health and wellness. Preventive health guidelines for women include the following key practices.  A routine yearly physical is a good way to check with your health care provider about your health and preventive screening. It is a chance to share any concerns and updates on your health and to receive a thorough exam.  Visit your dentist for a routine exam and preventive care every 6 months. Brush your teeth twice a day and floss once a day. Good oral hygiene prevents tooth decay and gum disease.  The frequency of eye exams is based on your age, health, family medical history, use of contact lenses, and other factors. Follow your health care provider's recommendations for frequency of eye exams.  Eat a healthy diet. Foods like vegetables, fruits, whole grains, low-fat dairy products, and lean protein foods contain the nutrients you need without too many calories. Decrease your intake of foods high in solid fats, added sugars, and salt. Eat the right amount of calories for you.Get information about a proper diet from your health care provider, if necessary.  Regular physical exercise is one of the most important things you can do for your health. Most adults should get at least 150 minutes of moderate-intensity exercise (any activity that increases your heart rate and causes you to sweat) each week. In addition, most adults need muscle-strengthening exercises on 2 or more days a week.  Maintain a healthy weight. The body mass index (BMI) is a screening tool to identify possible weight problems. It provides an estimate of body fat based on height and weight. Your health care provider can find your BMI and  can help you achieve or maintain a healthy weight.For adults 20 years and older:  A BMI below 18.5 is considered underweight.  A BMI of 18.5 to 24.9 is normal.  A BMI of 25 to 29.9 is considered overweight.  A BMI of 30 and above is considered obese.  Maintain normal blood lipids and cholesterol levels by exercising and minimizing your intake of saturated fat. Eat a balanced diet with plenty of fruit and vegetables. Blood tests for lipids and cholesterol should begin at age 67 and be repeated every 5 years. If your lipid or cholesterol levels are high, you are over 50, or you are at high risk for heart disease, you may need your cholesterol levels checked more frequently.Ongoing high lipid and cholesterol levels should be treated with medicines if diet and exercise are not working.  If you smoke, find out from your health care provider how to quit. If you do not use tobacco, do not start.  Lung cancer screening is recommended for adults aged 75-80 years who are at high risk for developing lung cancer because of a history of smoking. A yearly low-dose CT scan of the lungs is recommended for people who have at least a 30-pack-year history of smoking and are a current smoker or have quit within the past 15 years. A pack year of smoking is smoking an average of 1 pack of cigarettes a day for 1 year (for example: 1 pack a day for 30 years or 2 packs a day for 15 years). Yearly screening should continue until the smoker has stopped smoking  for at least 15 years. Yearly screening should be stopped for people who develop a health problem that would prevent them from having lung cancer treatment.  If you are pregnant, do not drink alcohol. If you are breastfeeding, be very cautious about drinking alcohol. If you are not pregnant and choose to drink alcohol, do not have more than 1 drink per day. One drink is considered to be 12 ounces (355 mL) of beer, 5 ounces (148 mL) of wine, or 1.5 ounces (44 mL) of  liquor.  Avoid use of street drugs. Do not share needles with anyone. Ask for help if you need support or instructions about stopping the use of drugs.  High blood pressure causes heart disease and increases the risk of stroke. Your blood pressure should be checked at least every 1 to 2 years. Ongoing high blood pressure should be treated with medicines if weight loss and exercise do not work.  If you are 6-11 years old, ask your health care provider if you should take aspirin to prevent strokes.  Diabetes screening involves taking a blood sample to check your fasting blood sugar level. This should be done once every 3 years, after age 8, if you are within normal weight and without risk factors for diabetes. Testing should be considered at a younger age or be carried out more frequently if you are overweight and have at least 1 risk factor for diabetes.  Breast cancer screening is essential preventive care for women. You should practice "breast self-awareness." This means understanding the normal appearance and feel of your breasts and may include breast self-examination. Any changes detected, no matter how small, should be reported to a health care provider. Women in their 79s and 30s should have a clinical breast exam (CBE) by a health care provider as part of a regular health exam every 1 to 3 years. After age 78, women should have a CBE every year. Starting at age 83, women should consider having a mammogram (breast X-ray test) every year. Women who have a family history of breast cancer should talk to their health care provider about genetic screening. Women at a high risk of breast cancer should talk to their health care providers about having an MRI and a mammogram every year.  Breast cancer gene (BRCA)-related cancer risk assessment is recommended for women who have family members with BRCA-related cancers. BRCA-related cancers include breast, ovarian, tubal, and peritoneal cancers. Having  family members with these cancers may be associated with an increased risk for harmful changes (mutations) in the breast cancer genes BRCA1 and BRCA2. Results of the assessment will determine the need for genetic counseling and BRCA1 and BRCA2 testing.  Routine pelvic exams to screen for cancer are no longer recommended for nonpregnant women who are considered low risk for cancer of the pelvic organs (ovaries, uterus, and vagina) and who do not have symptoms. Ask your health care provider if a screening pelvic exam is right for you.  If you have had past treatment for cervical cancer or a condition that could lead to cancer, you need Pap tests and screening for cancer for at least 20 years after your treatment. If Pap tests have been discontinued, your risk factors (such as having a new sexual partner) need to be reassessed to determine if screening should be resumed. Some women have medical problems that increase the chance of getting cervical cancer. In these cases, your health care provider may recommend more frequent screening and Pap tests.  The  HPV test is an additional test that may be used for cervical cancer screening. The HPV test looks for the virus that can cause the cell changes on the cervix. The cells collected during the Pap test can be tested for HPV. The HPV test could be used to screen women aged 51 years and older, and should be used in women of any age who have unclear Pap test results. After the age of 45, women should have HPV testing at the same frequency as a Pap test.  Colorectal cancer can be detected and often prevented. Most routine colorectal cancer screening begins at the age of 50 years and continues through age 9 years. However, your health care provider may recommend screening at an earlier age if you have risk factors for colon cancer. On a yearly basis, your health care provider may provide home test kits to check for hidden blood in the stool. Use of a small camera at  the end of a tube, to directly examine the colon (sigmoidoscopy or colonoscopy), can detect the earliest forms of colorectal cancer. Talk to your health care provider about this at age 58, when routine screening begins. Direct exam of the colon should be repeated every 5-10 years through age 6 years, unless early forms of pre-cancerous polyps or small growths are found.  People who are at an increased risk for hepatitis B should be screened for this virus. You are considered at high risk for hepatitis B if:  You were born in a country where hepatitis B occurs often. Talk with your health care provider about which countries are considered high risk.  Your parents were born in a high-risk country and you have not received a shot to protect against hepatitis B (hepatitis B vaccine).  You have HIV or AIDS.  You use needles to inject street drugs.  You live with, or have sex with, someone who has hepatitis B.  You get hemodialysis treatment.  You take certain medicines for conditions like cancer, organ transplantation, and autoimmune conditions.  Hepatitis C blood testing is recommended for all people born from [birth year] through 1964 and any individual with known risks for hepatitis C.  Practice safe sex. Use condoms and avoid high-risk sexual practices to reduce the spread of sexually transmitted infections (STIs). STIs include gonorrhea, chlamydia, syphilis, trichomonas, herpes, HPV, and human immunodeficiency virus (HIV). Herpes, HIV, and HPV are viral illnesses that have no cure. They can result in disability, cancer, and death.  You should be screened for sexually transmitted illnesses (STIs) including gonorrhea and chlamydia if:  You are sexually active and are younger than 24 years.  You are older than 24 years and your health care provider tells you that you are at risk for this type of infection.  Your sexual activity has changed since you were last screened and you are at an increased  risk for chlamydia or gonorrhea. Ask your health care provider if you are at risk.  If you are at risk of being infected with HIV, it is recommended that you take a prescription medicine daily to prevent HIV infection. This is called preexposure prophylaxis (PrEP). You are considered at risk if:  You are a heterosexual woman, are sexually active, and are at increased risk for HIV infection.  You take drugs by injection.  You are sexually active with a partner who has HIV.  Talk with your health care provider about whether you are at high risk of being infected with HIV. If you choose  to begin PrEP, you should first be tested for HIV. You should then be tested every 3 months for as long as you are taking PrEP.  Osteoporosis is a disease in which the bones lose minerals and strength with aging. This can result in serious bone fractures or breaks. The risk of osteoporosis can be identified using a bone density scan. Women ages 24 years and over and women at risk for fractures or osteoporosis should discuss screening with their health care providers. Ask your health care provider whether you should take a calcium supplement or vitamin D to reduce the rate of osteoporosis.  Menopause can be associated with physical symptoms and risks. Hormone replacement therapy is available to decrease symptoms and risks. You should talk to your health care provider about whether hormone replacement therapy is right for you.  Use sunscreen. Apply sunscreen liberally and repeatedly throughout the day. You should seek shade when your shadow is shorter than you. Protect yourself by wearing long sleeves, pants, a wide-brimmed hat, and sunglasses year round, whenever you are outdoors.  Once a month, do a whole body skin exam, using a mirror to look at the skin on your back. Tell your health care provider of new moles, moles that have irregular borders, moles that are larger than a pencil eraser, or moles that have changed  in shape or color.  Stay current with required vaccines (immunizations).  Influenza vaccine. All adults should be immunized every year.  Tetanus, diphtheria, and acellular pertussis (Td, Tdap) vaccine. Pregnant women should receive 1 dose of Tdap vaccine during each pregnancy. The dose should be obtained regardless of the length of time since the last dose. Immunization is preferred during the 27th-36th week of gestation. An adult who has not previously received Tdap or who does not know her vaccine status should receive 1 dose of Tdap. This initial dose should be followed by tetanus and diphtheria toxoids (Td) booster doses every 10 years. Adults with an unknown or incomplete history of completing a 3-dose immunization series with Td-containing vaccines should begin or complete a primary immunization series including a Tdap dose. Adults should receive a Td booster every 10 years.  Varicella vaccine. An adult without evidence of immunity to varicella should receive 2 doses or a second dose if she has previously received 1 dose. Pregnant females who do not have evidence of immunity should receive the first dose after pregnancy. This first dose should be obtained before leaving the health care facility. The second dose should be obtained 4-8 weeks after the first dose.  Human papillomavirus (HPV) vaccine. Females aged 13-26 years who have not received the vaccine previously should obtain the 3-dose series. The vaccine is not recommended for use in pregnant females. However, pregnancy testing is not needed before receiving a dose. If a female is found to be pregnant after receiving a dose, no treatment is needed. In that case, the remaining doses should be delayed until after the pregnancy. Immunization is recommended for any person with an immunocompromised condition through the age of 61 years if she did not get any or all doses earlier. During the 3-dose series, the second dose should be obtained 4-8 weeks  after the first dose. The third dose should be obtained 24 weeks after the first dose and 16 weeks after the second dose.  Zoster vaccine. One dose is recommended for adults aged 26 years or older unless certain conditions are present.  Measles, mumps, and rubella (MMR) vaccine. Adults born before [birth year]  generally are considered immune to measles and mumps. Adults born in [birth year] or later should have 1 or more doses of MMR vaccine unless there is a contraindication to the vaccine or there is laboratory evidence of immunity to each of the three diseases. A routine second dose of MMR vaccine should be obtained at least 28 days after the first dose for students attending postsecondary schools, health care workers, or international travelers. People who received inactivated measles vaccine or an unknown type of measles vaccine during 1963-1967 should receive 2 doses of MMR vaccine. People who received inactivated mumps vaccine or an unknown type of mumps vaccine before 1979 and are at high risk for mumps infection should consider immunization with 2 doses of MMR vaccine. For females of childbearing age, rubella immunity should be determined. If there is no evidence of immunity, females who are not pregnant should be vaccinated. If there is no evidence of immunity, females who are pregnant should delay immunization until after pregnancy. Unvaccinated health care workers born before [birth year] who lack laboratory evidence of measles, mumps, or rubella immunity or laboratory confirmation of disease should consider measles and mumps immunization with 2 doses of MMR vaccine or rubella immunization with 1 dose of MMR vaccine.  Pneumococcal 13-valent conjugate (PCV13) vaccine. When indicated, a person who is uncertain of her immunization history and has no record of immunization should receive the PCV13 vaccine. An adult aged 32 years or older who has certain medical conditions and has not been previously immunized should receive 1  dose of PCV13 vaccine. This PCV13 should be followed with a dose of pneumococcal polysaccharide (PPSV23) vaccine. The PPSV23 vaccine dose should be obtained at least 8 weeks after the dose of PCV13 vaccine. An adult aged 58 years or older who has certain medical conditions and previously received 1 or more doses of PPSV23 vaccine should receive 1 dose of PCV13. The PCV13 vaccine dose should be obtained 1 or more years after the last PPSV23 vaccine dose.  Pneumococcal polysaccharide (PPSV23) vaccine. When PCV13 is also indicated, PCV13 should be obtained first. All adults aged 46 years and older should be immunized. An adult younger than age 65 years who has certain medical conditions should be immunized. Any person who resides in a nursing home or long-term care facility should be immunized. An adult smoker should be immunized. People with an immunocompromised condition and certain other conditions should receive both PCV13 and PPSV23 vaccines. People with human immunodeficiency virus (HIV) infection should be immunized as soon as possible after diagnosis. Immunization during chemotherapy or radiation therapy should be avoided. Routine use of PPSV23 vaccine is not recommended for American Indians, [place] Natives, or people younger than 65 years unless there are medical conditions that require PPSV23 vaccine. When indicated, people who have unknown immunization and have no record of immunization should receive PPSV23 vaccine. One-time revaccination 5 years after the first dose of PPSV23 is recommended for people aged 19-64 years who have chronic kidney failure, nephrotic syndrome, asplenia, or immunocompromised conditions. People who received 1-2 doses of PPSV23 before age 53 years should receive another dose of PPSV23 vaccine at age 37 years or later if at least 5 years have passed since the previous dose. Doses of PPSV23 are not needed for people immunized with PPSV23 at or after age 17 years.  Meningococcal  vaccine. Adults with asplenia or persistent complement component deficiencies should receive 2 doses of quadrivalent meningococcal conjugate (MenACWY-D) vaccine. The doses should be obtained at least 2 months apart.  Microbiologists working with certain meningococcal bacteria, [employer] recruits, people at risk during an outbreak, and people who travel to or live in countries with a high rate of meningitis should be immunized. A first-year college student up through age 51 years who is living in a residence hall should receive a dose if she did not receive a dose on or after her 16th birthday. Adults who have certain high-risk conditions should receive one or more doses of vaccine.  Hepatitis A vaccine. Adults who wish to be protected from this disease, have certain high-risk conditions, work with hepatitis A-infected animals, work in hepatitis A research labs, or travel to or work in countries with a high rate of hepatitis A should be immunized. Adults who were previously unvaccinated and who anticipate close contact with an international adoptee during the first 60 days after arrival in the [place] from a country with a high rate of hepatitis A should be immunized.  Hepatitis B vaccine. Adults who wish to be protected from this disease, have certain high-risk conditions, may be exposed to blood or other infectious body fluids, are household contacts or sex partners of hepatitis B positive people, are clients or workers in certain care facilities, or travel to or work in countries with a high rate of hepatitis B should be immunized.  Haemophilus influenzae type b (Hib) vaccine. A previously unvaccinated person with asplenia or sickle cell disease or having a scheduled splenectomy should receive 1 dose of Hib vaccine. Regardless of previous immunization, a recipient of a hematopoietic stem cell transplant should receive a 3-dose series 6-12 months after her successful transplant. Hib vaccine is not  recommended for adults with HIV infection. Preventive Services / Frequency Ages 15 to 102 years  Blood pressure check.** / Every 1 to 2 years.  Lipid and cholesterol check.** / Every 5 years beginning at age 81.  Clinical breast exam.** / Every 3 years for women in their 41s and 65s.  BRCA-related cancer risk assessment.** / For women who have family members with a BRCA-related cancer (breast, ovarian, tubal, or peritoneal cancers).  Pap test.** / Every 2 years from ages 11 through 35. Every 3 years starting at age 73 through age 28 or 28 with a history of 3 consecutive normal Pap tests.  HPV screening.** / Every 3 years from ages 71 through ages 74 to 59 with a history of 3 consecutive normal Pap tests.  Hepatitis C blood test.** / For any individual with known risks for hepatitis C.  Skin self-exam. / Monthly.  Influenza vaccine. / Every year.  Tetanus, diphtheria, and acellular pertussis (Tdap, Td) vaccine.** / Consult your health care provider. Pregnant women should receive 1 dose of Tdap vaccine during each pregnancy. 1 dose of Td every 10 years.  Varicella vaccine.** / Consult your health care provider. Pregnant females who do not have evidence of immunity should receive the first dose after pregnancy.  HPV vaccine. / 3 doses over 6 months, if 55 and younger. The vaccine is not recommended for use in pregnant females. However, pregnancy testing is not needed before receiving a dose.  Measles, mumps, rubella (MMR) vaccine.** / You need at least 1 dose of MMR if you were born in 1957 or later. You may also need a 2nd dose. For females of childbearing age, rubella immunity should be determined. If there is no evidence of immunity, females who are not pregnant should be vaccinated. If there is no evidence of immunity, females who are pregnant should delay  immunization until after pregnancy.  Pneumococcal 13-valent conjugate (PCV13) vaccine.** / Consult your health care  provider.  Pneumococcal polysaccharide (PPSV23) vaccine.** / 1 to 2 doses if you smoke cigarettes or if you have certain conditions.  Meningococcal vaccine.** / 1 dose if you are age 49 to 18 years and a Market researcher living in a residence hall, or have one of several medical conditions, you need to get vaccinated against meningococcal disease. You may also need additional booster doses.  Hepatitis A vaccine.** / Consult your health care provider.  Hepatitis B vaccine.** / Consult your health care provider.  Haemophilus influenzae type b (Hib) vaccine.** / Consult your health care provider. Ages 54 to 61 years  Blood pressure check.** / Every 1 to 2 years.  Lipid and cholesterol check.** / Every 5 years beginning at age 1 years.  Lung cancer screening. / Every year if you are aged 66-80 years and have a 30-pack-year history of smoking and currently smoke or have quit within the past 15 years. Yearly screening is stopped once you have quit smoking for at least 15 years or develop a health problem that would prevent you from having lung cancer treatment.  Clinical breast exam.** / Every year after age 60 years.  BRCA-related cancer risk assessment.** / For women who have family members with a BRCA-related cancer (breast, ovarian, tubal, or peritoneal cancers).  Mammogram.** / Every year beginning at age 55 years and continuing for as long as you are in good health. Consult with your health care provider.  Pap test.** / Every 3 years starting at age 73 years through age 58 or 67 years with a history of 3 consecutive normal Pap tests.  HPV screening.** / Every 3 years from ages 15 years through ages 72 to 69 years with a history of 3 consecutive normal Pap tests.  Fecal occult blood test (FOBT) of stool. / Every year beginning at age 77 years and continuing until age 81 years. You may not need to do this test if you get a colonoscopy every 10 years.  Flexible sigmoidoscopy  or colonoscopy.** / Every 5 years for a flexible sigmoidoscopy or every 10 years for a colonoscopy beginning at age 69 years and continuing until age 102 years.  Hepatitis C blood test.** / For all people born from [birth year] through 1964 and any individual with known risks for hepatitis C.  Skin self-exam. / Monthly.  Influenza vaccine. / Every year.  Tetanus, diphtheria, and acellular pertussis (Tdap/Td) vaccine.** / Consult your health care provider. Pregnant women should receive 1 dose of Tdap vaccine during each pregnancy. 1 dose of Td every 10 years.  Varicella vaccine.** / Consult your health care provider. Pregnant females who do not have evidence of immunity should receive the first dose after pregnancy.  Zoster vaccine.** / 1 dose for adults aged 20 years or older.  Measles, mumps, rubella (MMR) vaccine.** / You need at least 1 dose of MMR if you were born in 1956 or later. You may also need a 2nd dose. For females of childbearing age, rubella immunity should be determined. If there is no evidence of immunity, females who are not pregnant should be vaccinated. If there is no evidence of immunity, females who are pregnant should delay immunization until after pregnancy.  Pneumococcal 13-valent conjugate (PCV13) vaccine.** / Consult your health care provider.  Pneumococcal polysaccharide (PPSV23) vaccine.** / 1 to 2 doses if you smoke cigarettes or if you have certain conditions.  Meningococcal vaccine.** /  Consult your health care provider.  Hepatitis A vaccine.** / Consult your health care provider.  Hepatitis B vaccine.** / Consult your health care provider.  Haemophilus influenzae type b (Hib) vaccine.** / Consult your health care provider. Ages 40 years and over  Blood pressure check.** / Every 1 to 2 years.  Lipid and cholesterol check.** / Every 5 years beginning at age 17 years.  Lung cancer screening. / Every year if you are aged 83-80 years and have a 30-pack-year history  of smoking and currently smoke or have quit within the past 15 years. Yearly screening is stopped once you have quit smoking for at least 15 years or develop a health problem that would prevent you from having lung cancer treatment.  Clinical breast exam.** / Every year after age 62 years.  BRCA-related cancer risk assessment.** / For women who have family members with a BRCA-related cancer (breast, ovarian, tubal, or peritoneal cancers).  Mammogram.** / Every year beginning at age 31 years and continuing for as long as you are in good health. Consult with your health care provider.  Pap test.** / Every 3 years starting at age 53 years through age 27 or 59 years with 3 consecutive normal Pap tests. Testing can be stopped between 65 and 70 years with 3 consecutive normal Pap tests and no abnormal Pap or HPV tests in the past 10 years.  HPV screening.** / Every 3 years from ages 61 years through ages 101 or 9 years with a history of 3 consecutive normal Pap tests. Testing can be stopped between 65 and 70 years with 3 consecutive normal Pap tests and no abnormal Pap or HPV tests in the past 10 years.  Fecal occult blood test (FOBT) of stool. / Every year beginning at age 32 years and continuing until age 28 years. You may not need to do this test if you get a colonoscopy every 10 years.  Flexible sigmoidoscopy or colonoscopy.** / Every 5 years for a flexible sigmoidoscopy or every 10 years for a colonoscopy beginning at age 71 years and continuing until age 84 years.  Hepatitis C blood test.** / For all people born from [birth year] through 1964 and any individual with known risks for hepatitis C.  Osteoporosis screening.** / A one-time screening for women ages 33 years and over and women at risk for fractures or osteoporosis.  Skin self-exam. / Monthly.  Influenza vaccine. / Every year.  Tetanus, diphtheria, and acellular pertussis (Tdap/Td) vaccine.** / 1 dose of Td every 10 years.  Varicella  vaccine.** / Consult your health care provider.  Zoster vaccine.** / 1 dose for adults aged 73 years or older.  Pneumococcal 13-valent conjugate (PCV13) vaccine.** / Consult your health care provider.  Pneumococcal polysaccharide (PPSV23) vaccine.** / 1 dose for all adults aged 45 years and older.  Meningococcal vaccine.** / Consult your health care provider.  Hepatitis A vaccine.** / Consult your health care provider.  Hepatitis B vaccine.** / Consult your health care provider.  Haemophilus influenzae type b (Hib) vaccine.** / Consult your health care provider. ** Family history and personal history of risk and conditions may change your health care provider's recommendations. Document Released: 11/14/2001 Document Revised: 02/02/2014 Document Reviewed: 02/13/2011 [facility] Patient Information 2015 [place], [place]. This information is not intended to replace advice given to you by your health care provider. Make sure you discuss any questions you have with your health care provider.

## 2014-09-07 NOTE — Telephone Encounter (Signed)
Labs look great overall.  Patient's cholesterol mildly elevated.  She needs to limit intake of foods high in saturated fats and cholesterol.  Her Vitamin D level is pretty low.  I have sent in an Rx for Ergocalciferol 50,000 Units per capsule.  Patient is to take 1 pill every 7 days for the next 2 months.  Follow-up in 2 months.

## 2014-09-08 NOTE — Telephone Encounter (Signed)
Pt notified, pt verbalized understanding. Pt to call back to schedule her f/u appt .

## 2014-09-09 DIAGNOSIS — Z136 Encounter for screening for cardiovascular disorders: Secondary | ICD-10-CM | POA: Insufficient documentation

## 2014-09-09 NOTE — Assessment & Plan Note (Signed)
I have reviewed the patient's medical history in detail and updated the computerized patient record. Patient declines Tetanus.  Flu shot given by nursing staff.  Patient refuses Mammogram or Colonoscopy despite lengthy discussed about benefits of screening.  Patient also refuses IFOB.  Patient is agreeable to bone density scan so this will be ordered today.  Will obtain fasting labs at today's visit.

## 2014-09-09 NOTE — Assessment & Plan Note (Signed)
EKG reveals chronic LBBB but no new abnormalities.  Patient with previous extensive Cardiology workup. Asymptomatic. No further intervention necessary at present.  81 mg ASA daily.

## 2014-09-09 NOTE — Assessment & Plan Note (Signed)
Well-controlled.  Continue current regimen. 

## 2014-09-18 ENCOUNTER — Ambulatory Visit (INDEPENDENT_AMBULATORY_CARE_PROVIDER_SITE_OTHER): Payer: 59 | Admitting: Physician Assistant

## 2014-09-18 ENCOUNTER — Encounter: Payer: Self-pay | Admitting: Physician Assistant

## 2014-09-18 VITALS — BP 127/74 | HR 85 | Temp 97.7°F | Wt 204.2 lb

## 2014-09-18 DIAGNOSIS — N951 Menopausal and female climacteric states: Secondary | ICD-10-CM

## 2014-09-18 MED ORDER — ESTRADIOL 0.025 MG/24HR TD PTTW: 1.0000 | MEDICATED_PATCH | TRANSDERMAL | Status: DC

## 2014-09-18 NOTE — Patient Instructions (Signed)
Please use patch as directed.  Do not begin smoking while on the patch.  Follow-up with me in 1 month. I will call you with your lab results.  We will only continue therapy while it is absolutely needed.  We can try to wean off the patch in 6 months or so.  Hormone Therapy At menopause, your body begins making less estrogen and progesterone hormones. This causes the body to stop having menstrual periods. This is because estrogen and progesterone hormones control your periods and menstrual cycle. A lack of estrogen may cause symptoms such as:  Hot flushes (or hot flashes).  Vaginal dryness.  Dry skin.  Loss of sex drive.  Risk of bone loss (osteoporosis). When this happens, you may choose to take hormone therapy to get back the estrogen lost during menopause. When the hormone estrogen is given alone, it is usually referred to as ET (Estrogen Therapy). When the hormone progestin is combined with estrogen, it is generally called HT (Hormone Therapy). This was formerly known as hormone replacement therapy (HRT). Your caregiver can help you make a decision on what will be best for you. The decision to use HT seems to change often as new studies are done. Many studies do not agree on the benefits of hormone replacement therapy. LIKELY BENEFITS OF HT INCLUDE PROTECTION FROM:  Hot Flushes (also called hot flashes) - A hot flush is a sudden feeling of heat that spreads over the face and body. The skin may redden like a blush. It is connected with sweats and sleep disturbance. Women going through menopause may have hot flushes a few times a month or several times per day depending on the woman.  Osteoporosis (bone loss)- Estrogen helps guard against bone loss. After menopause, a woman's bones slowly lose calcium and become weak and brittle. As a result, bones are more likely to break. The hip, wrist, and spine are affected most often. Hormone therapy can help slow bone loss after menopause. Weight bearing  exercise and taking calcium with vitamin D also can help prevent bone loss. There are also medications that your caregiver can prescribe that can help prevent osteoporosis.  Vaginal Dryness - Loss of estrogen causes changes in the vagina. Its lining may become thin and dry. These changes can cause pain and bleeding during sexual intercourse. Dryness can also lead to infections. This can cause burning and itching. (Vaginal estrogen treatment can help relieve pain, itching, and dryness.)  Urinary Tract Infections are more common after menopause because of lack of estrogen. Some women also develop urinary incontinence because of low estrogen levels in the vagina and bladder.  Possible other benefits of estrogen include a positive effect on mood and short-term memory in women. RISKS AND COMPLICATIONS  Using estrogen alone without progesterone causes the lining of the uterus to grow. This increases the risk of lining of the uterus (endometrial) cancer. Your caregiver should give another hormone called progestin if you have a uterus.  Women who take combined (estrogen and progestin) HT appear to have an increased risk of breast cancer. The risk appears to be small, but increases throughout the time that HT is taken.  Combined therapy also makes the breast tissue slightly denser which makes it harder to read mammograms (breast X-rays).  Combined, estrogen and progesterone therapy can be taken together every day, in which case there may be spotting of blood. HT therapy can be taken cyclically in which case you will have menstrual periods. Cyclically means HT is taken  for a set amount of days, then not taken, then this process is repeated.  HT may increase the risk of stroke, heart attack, breast cancer and forming blood clots in your leg.  Transdermal estrogen (estrogen that is absorbed through the skin with a patch or a cream) may have more positive results with:  Cholesterol.  Blood  pressure.  Blood clots. Having the following conditions may indicate you should not have HT:  Endometrial cancer.  Liver disease.  Breast cancer.  Heart disease.  History of blood clots.  Stroke. TREATMENT   If you choose to take HT and have a uterus, usually estrogen and progestin are prescribed.  Your caregiver will help you decide the best way to take the medications.  Possible ways to take estrogen include:  Pills.  Patches.  Gels.  Sprays.  Vaginal estrogen cream, rings and tablets.  It is best to take the lowest dose possible that will help your symptoms and take them for the shortest period of time that you can.  Hormone therapy can help relieve some of the problems (symptoms) that affect women at menopause. Before making a decision about HT, talk to your caregiver about what is best for you. Be well informed and comfortable with your decisions. HOME CARE INSTRUCTIONS   Follow your caregivers advice when taking the medications.  A Pap test is done to screen for cervical cancer.  The first Pap test should be done at age 58.  Between ages 36 and 33, Pap tests are repeated every 2 years.  Beginning at age 31, you are advised to have a Pap test every 3 years as long as your past 3 Pap tests have been normal.  Some women have medical problems that increase the chance of getting cervical cancer. Talk to your caregiver about these problems. It is especially important to talk to your caregiver if a new problem develops soon after your last Pap test. In these cases, your caregiver may recommend more frequent screening and Pap tests.  The above recommendations are the same for women who have or have not gotten the vaccine for HPV (Human Papillomavirus).  If you had a hysterectomy for a problem that was not a cancer or a condition that could lead to cancer, then you no longer need Pap tests. However, even if you no longer need a Pap test, a regular exam is a good idea  to make sure no other problems are starting.   If you are between ages 60 and 97, and you have had normal Pap tests going back 10 years, you no longer need Pap tests. However, even if you no longer need a Pap test, a regular exam is a good idea to make sure no other problems are starting.   If you have had past treatment for cervical cancer or a condition that could lead to cancer, you need Pap tests and screening for cancer for at least 20 years after your treatment.  If Pap tests have been discontinued, risk factors (such as a new sexual partner) need to be re-assessed to determine if screening should be resumed.  Some women may need screenings more often if they are at high risk for cervical cancer.  Get mammograms done as per the advice of your caregiver. SEEK IMMEDIATE MEDICAL CARE IF:  You develop abnormal vaginal bleeding.  You have pain or swelling in your legs, shortness of breath, or chest pain.  You develop dizziness or headaches.  You have lumps or changes  in your breasts or armpits.  You have slurred speech.  You develop weakness or numbness of your arms or legs.  You have pain, burning, or bleeding when urinating.  You develop abdominal pain. Document Released: 06/17/2003 Document Revised: 12/11/2011 Document Reviewed: 10/05/2010 [facility] Patient Information 2015 [place], [place]. This information is not intended to replace advice given to you by your health care provider. Make sure you discuss any questions you have with your health care provider.

## 2014-09-18 NOTE — Progress Notes (Signed)
Patient presents to clinic today c/o hot flashes, more severe over the past few weeks.  Patient s/p hysterectomy in 2004. Has tried soy isoflavones before without improvement in symptoms.  Patient endorses being treated with HRT for a few months due to surgical menopausal symptoms at time of hysterectomy.  Denies change in diet or medications recently.  Patient is not a smoker.  Patient with family history of breast cancer, but no personal history of breast cancer.  Patient has previously refused mammogram previously. No FSH on file.  Past Medical History  Diagnosis Date  . ADD (attention deficit disorder)   . Environmental allergies   . Chicken pox   . H/O vitamin D deficiency   . Seasonal affective disorder   . LBBB (left bundle branch block)     W/1st degree AV Block, Avoid Beta Blockers  . AR (allergic rhinitis)   . Depression   . Dyslipidemia     Current Outpatient Prescriptions on File Prior to Visit  Medication Sig Dispense Refill  . amphetamine-dextroamphetamine (ADDERALL) 10 MG tablet Take 1 tablet (10 mg total) by mouth daily. Fill on or after 06/12/2014 30 tablet 0  . fexofenadine-pseudoephedrine (ALLEGRA-D 24) 180-240 MG per 24 hr tablet Take 1 tablet by mouth daily.    . fluticasone (FLONASE) 50 MCG/ACT nasal spray Place 1 spray into both nostrils daily.    Marland Kitchen lisdexamfetamine (VYVANSE) 40 MG capsule Take 1 capsule (40 mg total) by mouth every morning. 30 capsule 0  . montelukast (SINGULAIR) 10 MG tablet Take 1 tablet (10 mg total) by mouth at bedtime. 90 tablet 1  . Vitamin D, Ergocalciferol, (DRISDOL) 50000 UNITS CAPS capsule Take 1 capsule (50,000 Units total) by mouth every 7 (seven) days. 8 capsule 0   No current facility-administered medications on file prior to visit.    Allergies  Allergen Reactions  . Bee Venom Anaphylaxis  . Erythromycin Itching and Rash  . Penicillins Hives, Itching and Rash    Family History  Problem Relation Age of Onset  . COPD Mother      Living  . Pancreatic cancer Father 45    Deceased  . Lung cancer Paternal Grandfather   . Heart disease Paternal Grandmother   . Hypertension Paternal Grandmother   . Alzheimer's disease Maternal Grandfather   . Arthritis/Rheumatoid Maternal Grandmother   . Heart disease Maternal Grandmother     Tachycardia  . Cancer Paternal Uncle   . Breast cancer Sister 75  . Healthy Brother     x3  . Healthy Daughter     x3    History   Social History  . Marital Status: Legally Separated    Spouse Name: N/A    Number of Children: N/A  . Years of Education: N/A   Social History Main Topics  . Smoking status: Never Smoker   . Smokeless tobacco: Never Used  . Alcohol Use: 2.4 oz/week    4 Glasses of wine per week  . Drug Use: No  . Sexual Activity: None   Other Topics Concern  . None   Social History Narrative   Review of Systems - See HPI.  All other ROS are negative.  BP 127/74 mmHg  Pulse 85  Temp(Src) 97.7 F (36.5 C) (Oral)  Wt 204 lb 3.2 oz (92.625 kg)  SpO2 98%  Physical Exam  Constitutional: She is oriented to person, place, and time and well-developed, well-nourished, and in no distress.  HENT:  Head: Normocephalic and atraumatic.  Eyes:  Conjunctivae are normal.  Neck: Neck supple.  Cardiovascular: Normal rate, regular rhythm, normal heart sounds and intact distal pulses.   Pulmonary/Chest: Effort normal and breath sounds normal. No respiratory distress. She has no wheezes. She has no rales. She exhibits no tenderness.  Lymphadenopathy:    She has no cervical adenopathy.  Neurological: She is alert and oriented to person, place, and time.  Skin: Skin is warm and dry. No rash noted.  Psychiatric: Affect normal.  Vitals reviewed.  Recent Results (from the past 2160 hour(s))  CBC     Status: None   Collection Time: 09/07/14  8:53 AM  Result Value Ref Range   WBC 7.3 4.0 - 10.5 K/uL   RBC 4.16 3.87 - 5.11 Mil/uL   Platelets 229.0 150.0 - 400.0 K/uL    Hemoglobin 12.6 12.0 - 15.0 g/dL   HCT 38.3 36.0 - 46.0 %   MCV 92.2 78.0 - 100.0 fl   MCHC 32.9 30.0 - 36.0 g/dL   RDW 14.6 11.5 - 32.3 %  Basic Metabolic Panel (BMET)     Status: Abnormal   Collection Time: 09/07/14  8:53 AM  Result Value Ref Range   Sodium 142 135 - 145 mEq/L   Potassium 5.1 3.5 - 5.1 mEq/L   Chloride 110 96 - 112 mEq/L   CO2 26 19 - 32 mEq/L   Glucose, Bld 100 (H) 70 - 99 mg/dL   BUN 14 6 - 23 mg/dL   Creatinine, Ser 0.8 0.4 - 1.2 mg/dL   Calcium 9.3 8.4 - 10.5 mg/dL   GFR 74.29 >60.00 mL/min  Hepatic function panel     Status: None   Collection Time: 09/07/14  8:53 AM  Result Value Ref Range   Total Bilirubin 0.8 0.2 - 1.2 mg/dL   Bilirubin, Direct 0.1 0.0 - 0.3 mg/dL   Alkaline Phosphatase 50 39 - 117 U/L   AST 17 0 - 37 U/L   ALT 14 0 - 35 U/L   Total Protein 7.1 6.0 - 8.3 g/dL   Albumin 4.1 3.5 - 5.2 g/dL  TSH     Status: None   Collection Time: 09/07/14  8:53 AM  Result Value Ref Range   TSH 3.75 0.35 - 4.50 uIU/mL  Hemoglobin A1c     Status: None   Collection Time: 09/07/14  8:53 AM  Result Value Ref Range   Hgb A1c MFr Bld 5.6 4.6 - 6.5 %    Comment: Glycemic Control Guidelines for People with Diabetes:Non Diabetic:  <6%Goal of Therapy: <7%Additional Action Suggested:  >8%   Urinalysis, Routine w reflex microscopic     Status: Abnormal   Collection Time: 09/07/14  8:53 AM  Result Value Ref Range   Color, Urine YELLOW Yellow;Lt. Yellow   APPearance CLEAR Clear   Specific Gravity, Urine >=1.030 (A) 1.000 - 1.030   pH 5.0 5.0 - 8.0   Total Protein, Urine NEGATIVE Negative   Urine Glucose NEGATIVE Negative   Ketones, ur NEGATIVE Negative   Bilirubin Urine NEGATIVE Negative   Hgb urine dipstick NEGATIVE Negative   Urobilinogen, UA 0.2 0.0 - 1.0   Leukocytes, UA NEGATIVE Negative   Nitrite NEGATIVE Negative   WBC, UA 0-2/hpf 0-2/hpf   RBC / HPF none seen 0-2/hpf   Squamous Epithelial / LPF Rare(0-4/hpf) Rare(0-4/hpf)  Lipid Profile      Status: Abnormal   Collection Time: 09/07/14  8:53 AM  Result Value Ref Range   Cholesterol 225 (H) 0 - 200 mg/dL  Comment: ATP III Classification       Desirable:  < 200 mg/dL               Borderline High:  200 - 239 mg/dL          High:  > = 240 mg/dL   Triglycerides 95.0 0.0 - 149.0 mg/dL    Comment: Normal:  <150 mg/dLBorderline High:  150 - 199 mg/dL   HDL 69.60 >39.00 mg/dL   VLDL 19.0 0.0 - 40.0 mg/dL   LDL Cholesterol 136 (H) 0 - 99 mg/dL   Total CHOL/HDL Ratio 3     Comment:                Men          Women1/2 Average Risk     3.4          3.3Average Risk          5.0          4.42X Average Risk          9.6          7.13X Average Risk          15.0          11.0                       NonHDL 155.40     Comment: NOTE:  Non-HDL goal should be 30 mg/dL higher than patient's LDL goal (i.e. LDL goal of < 70 mg/dL, would have non-HDL goal of < 100 mg/dL)  Vitamin D (25 hydroxy)     Status: Abnormal   Collection Time: 09/07/14  8:53 AM  Result Value Ref Range   VITD 11.97 (L) 30.00 - 100.00 ng/mL    Assessment/Plan: Hot flashes, menopausal Will obtain FSH levels but due to surgical history, menopause is the most likely reason for symptoms. After discussing risks and benefits or HRT with patient, will begin lowest dose of [place].  Will follow-up in 1 month.  Will only continue HRT for shortest duration needed.

## 2014-09-18 NOTE — Assessment & Plan Note (Signed)
Will obtain [place] levels but due to surgical history, menopause is the most likely reason for symptoms. After discussing risks and benefits or HRT with patient, will begin lowest dose of [place].  Will follow-up in 1 month.  Will only continue HRT for shortest duration needed.

## 2014-09-18 NOTE — Progress Notes (Signed)
Pre visit review using our clinic review tool, if applicable. No additional management support is needed unless otherwise documented below in the visit note. 

## 2014-09-21 ENCOUNTER — Other Ambulatory Visit: Payer: 59

## 2014-09-21 DIAGNOSIS — E349 Endocrine disorder, unspecified: Secondary | ICD-10-CM

## 2014-09-22 LAB — FOLLICLE STIMULATING HORMONE: FSH: 43.3 m[IU]/mL

## 2014-10-05 ENCOUNTER — Telehealth: Payer: Self-pay | Admitting: Physician Assistant

## 2014-10-05 NOTE — Telephone Encounter (Signed)
Caller name: Johnsie Relation to pt: self Call back number: [phone] Pharmacy: [facility] [address] [place]  Reason for call:   Patient states that she had an eye infection in left eye and was given doxycicline. Now, the infection is in right eye and would like to know if more medication could be called in. Saw Percell Miller

## 2014-10-06 NOTE — Telephone Encounter (Signed)
Left message on patients answering machine that she will need to be seen in office for further medication.

## 2014-10-21 ENCOUNTER — Encounter: Payer: Self-pay | Admitting: Physician Assistant

## 2014-10-28 ENCOUNTER — Ambulatory Visit: Payer: 59 | Admitting: Physician Assistant

## 2014-11-02 ENCOUNTER — Telehealth: Payer: Self-pay | Admitting: *Deleted

## 2014-11-02 ENCOUNTER — Ambulatory Visit: Payer: 59 | Admitting: Physician Assistant

## 2014-11-02 NOTE — Telephone Encounter (Signed)
Pt did not show for appointment 11/02/14 at 7:00am for "1 month f/u--tf,cma/ss--lm to confirm on 10/30/14".  Charge no show fee?

## 2014-11-02 NOTE — Telephone Encounter (Signed)
Do not charge patient.

## 2014-11-02 NOTE — Telephone Encounter (Signed)
See note below

## 2014-11-13 ENCOUNTER — Emergency Department (HOSPITAL_BASED_OUTPATIENT_CLINIC_OR_DEPARTMENT_OTHER): Payer: 59

## 2014-11-13 ENCOUNTER — Encounter (HOSPITAL_BASED_OUTPATIENT_CLINIC_OR_DEPARTMENT_OTHER): Payer: Self-pay | Admitting: *Deleted

## 2014-11-13 ENCOUNTER — Inpatient Hospital Stay (HOSPITAL_BASED_OUTPATIENT_CLINIC_OR_DEPARTMENT_OTHER)
Admission: EM | Admit: 2014-11-13 | Discharge: 2014-11-19 | DRG: 287 | Disposition: A | Discharge status: Home / Self Care | Payer: 59 | Attending: Internal Medicine | Admitting: Internal Medicine

## 2014-11-13 DIAGNOSIS — R55 Syncope and collapse: Secondary | ICD-10-CM | POA: Diagnosis present

## 2014-11-13 DIAGNOSIS — R0602 Shortness of breath: Secondary | ICD-10-CM | POA: Insufficient documentation

## 2014-11-13 DIAGNOSIS — J90 Pleural effusion, not elsewhere classified: Secondary | ICD-10-CM | POA: Diagnosis present

## 2014-11-13 DIAGNOSIS — I420 Dilated cardiomyopathy: Secondary | ICD-10-CM | POA: Diagnosis present

## 2014-11-13 DIAGNOSIS — F329 Major depressive disorder, single episode, unspecified: Secondary | ICD-10-CM | POA: Diagnosis present

## 2014-11-13 DIAGNOSIS — Z79899 Other long term (current) drug therapy: Secondary | ICD-10-CM | POA: Diagnosis not present

## 2014-11-13 DIAGNOSIS — Z880 Allergy status to penicillin: Secondary | ICD-10-CM

## 2014-11-13 DIAGNOSIS — F909 Attention-deficit hyperactivity disorder, unspecified type: Secondary | ICD-10-CM | POA: Diagnosis present

## 2014-11-13 DIAGNOSIS — I447 Left bundle-branch block, unspecified: Secondary | ICD-10-CM | POA: Diagnosis present

## 2014-11-13 DIAGNOSIS — I471 Supraventricular tachycardia: Secondary | ICD-10-CM | POA: Diagnosis not present

## 2014-11-13 DIAGNOSIS — I440 Atrioventricular block, first degree: Secondary | ICD-10-CM | POA: Diagnosis present

## 2014-11-13 DIAGNOSIS — Z91030 Bee allergy status: Secondary | ICD-10-CM

## 2014-11-13 DIAGNOSIS — R0789 Other chest pain: Secondary | ICD-10-CM | POA: Diagnosis present

## 2014-11-13 DIAGNOSIS — Z881 Allergy status to other antibiotic agents status: Secondary | ICD-10-CM

## 2014-11-13 DIAGNOSIS — F410 Panic disorder [episodic paroxysmal anxiety] without agoraphobia: Secondary | ICD-10-CM | POA: Diagnosis present

## 2014-11-13 DIAGNOSIS — J811 Chronic pulmonary edema: Secondary | ICD-10-CM | POA: Diagnosis present

## 2014-11-13 DIAGNOSIS — Z90710 Acquired absence of both cervix and uterus: Secondary | ICD-10-CM | POA: Diagnosis not present

## 2014-11-13 DIAGNOSIS — Z8249 Family history of ischemic heart disease and other diseases of the circulatory system: Secondary | ICD-10-CM

## 2014-11-13 DIAGNOSIS — F988 Other specified behavioral and emotional disorders with onset usually occurring in childhood and adolescence: Secondary | ICD-10-CM | POA: Diagnosis present

## 2014-11-13 DIAGNOSIS — R209 Unspecified disturbances of skin sensation: Secondary | ICD-10-CM | POA: Insufficient documentation

## 2014-11-13 DIAGNOSIS — I517 Cardiomegaly: Secondary | ICD-10-CM | POA: Diagnosis present

## 2014-11-13 DIAGNOSIS — I5021 Acute systolic (congestive) heart failure: Principal | ICD-10-CM | POA: Diagnosis present

## 2014-11-13 DIAGNOSIS — I959 Hypotension, unspecified: Secondary | ICD-10-CM | POA: Diagnosis present

## 2014-11-13 DIAGNOSIS — N951 Menopausal and female climacteric states: Secondary | ICD-10-CM | POA: Diagnosis present

## 2014-11-13 DIAGNOSIS — E785 Hyperlipidemia, unspecified: Secondary | ICD-10-CM | POA: Diagnosis present

## 2014-11-13 DIAGNOSIS — I272 Other secondary pulmonary hypertension: Secondary | ICD-10-CM | POA: Diagnosis present

## 2014-11-13 DIAGNOSIS — I429 Cardiomyopathy, unspecified: Secondary | ICD-10-CM

## 2014-11-13 DIAGNOSIS — I428 Other cardiomyopathies: Secondary | ICD-10-CM | POA: Insufficient documentation

## 2014-11-13 DIAGNOSIS — I509 Heart failure, unspecified: Secondary | ICD-10-CM | POA: Insufficient documentation

## 2014-11-13 DIAGNOSIS — J069 Acute upper respiratory infection, unspecified: Secondary | ICD-10-CM | POA: Diagnosis present

## 2014-11-13 LAB — TROPONIN I

## 2014-11-13 LAB — BASIC METABOLIC PANEL
ANION GAP: 4 — AB (ref 5–15)
BUN: 19 mg/dL (ref 6–23)
CALCIUM: 8.7 mg/dL (ref 8.4–10.5)
CO2: 21 mmol/L (ref 19–32)
CREATININE: 0.93 mg/dL (ref 0.50–1.10)
Chloride: 112 mmol/L (ref 96–112)
GFR calc Af Amer: 78 mL/min — ABNORMAL LOW (ref >90)
GFR, EST NON AFRICAN AMERICAN: 67 mL/min — AB (ref >90)
GLUCOSE: 101 mg/dL — AB (ref 70–99)
POTASSIUM: 3.9 mmol/L (ref 3.5–5.1)
Sodium: 137 mmol/L (ref 135–145)

## 2014-11-13 LAB — CBC WITH DIFFERENTIAL/PLATELET
Basophils Absolute: 0.1 10*3/uL (ref 0.0–0.1)
Basophils Relative: 1 % (ref 0–1)
EOS PCT: 2 % (ref 0–5)
Eosinophils Absolute: 0.1 10*3/uL (ref 0.0–0.7)
HCT: 35.5 % — ABNORMAL LOW (ref 36.0–46.0)
HEMOGLOBIN: 11.6 g/dL — AB (ref 12.0–15.0)
Lymphocytes Relative: 27 % (ref 12–46)
Lymphs Abs: 1.9 10*3/uL (ref 0.7–4.0)
MCH: 30.3 pg (ref 26.0–34.0)
MCHC: 32.7 g/dL (ref 30.0–36.0)
MCV: 92.7 fL (ref 78.0–100.0)
MONO ABS: 0.4 10*3/uL (ref 0.1–1.0)
MONOS PCT: 6 % (ref 3–12)
NEUTROS ABS: 4.5 10*3/uL (ref 1.7–7.7)
Neutrophils Relative %: 64 % (ref 43–77)
Platelets: 235 10*3/uL (ref 150–400)
RBC: 3.83 MIL/uL — AB (ref 3.87–5.11)
RDW: 13 % (ref 11.5–15.5)
WBC: 7 10*3/uL (ref 4.0–10.5)

## 2014-11-13 LAB — D-DIMER, QUANTITATIVE: D-Dimer, Quant: 0.61 ug/mL-FEU — ABNORMAL HIGH (ref 0.00–0.48)

## 2014-11-13 LAB — BRAIN NATRIURETIC PEPTIDE: B NATRIURETIC PEPTIDE 5: 655.6 pg/mL — AB (ref 0.0–100.0)

## 2014-11-13 MED ORDER — IOHEXOL 350 MG/ML SOLN
100.0000 mL | Freq: Once | INTRAVENOUS | Status: AC | PRN
  Administered 2014-11-13: 100 mL via INTRAVENOUS

## 2014-11-13 MED ORDER — ALBUTEROL SULFATE HFA 108 (90 BASE) MCG/ACT IN AERS
1.0000 | INHALATION_SPRAY | Freq: Once | RESPIRATORY_TRACT | Status: AC
  Administered 2014-11-13: 1 via RESPIRATORY_TRACT
  Filled 2014-11-13: qty 6.7

## 2014-11-13 MED ORDER — FUROSEMIDE 10 MG/ML IJ SOLN
40.0000 mg | Freq: Once | INTRAMUSCULAR | Status: AC
  Administered 2014-11-13: 40 mg via INTRAVENOUS
  Filled 2014-11-13: qty 4

## 2014-11-13 NOTE — ED Notes (Signed)
States she feels like she can breath better since the inhaler was given.

## 2014-11-13 NOTE — ED Notes (Signed)
I took a new ECG with portable monitor. I verified that Muse shows correct patient and ECG. I gave corrected ECG to Dr. Maryan Rued.

## 2014-11-13 NOTE — ED Provider Notes (Addendum)
CSN: [id number]     Arrival date & time 11/13/14  1801 History  This chart was scribed for Debra Dessert, MD by [name], ED Scribe. This patient was seen in room MH10/MH10 and the patient's care was started at 6:04 PM.    No chief complaint on file.  Patient is a 59 y.o. female presenting with chest pain. The history is provided by the patient. No language interpreter was used.  Chest Pain    HPI Comments: Debra Holloway is a 59 y.o. female with past medical history of LBBB who presents to the Emergency Department complaining of SOB, dizziness, lightheadedness beginning this afternoon. She describes the sensation as a "tightness" in her chest. Patient report she had URI symptoms last week (otalgia, sore throat, slight nonproductive cough) which have "moved down into her chest earlier this week" and acutely worsened today. Patient explains she is seated most of the day at work and that her symptoms are manageable when seated and resting, but worsen with activity or exertion. She has been eating and drinking without issue. She denies nausea, recent travel, pain or swelling in one leg. She denies history of asthma.   She is a nonsmoker. She is not on any hormone therapy at this time. Last cardiac cath 10 years PTA; no blockages at that time.   Past Medical History  Diagnosis Date  . ADD (attention deficit disorder)   . Environmental allergies   . Chicken pox   . H/O vitamin D deficiency   . Seasonal affective disorder   . LBBB (left bundle branch block)     W/1st degree AV Block, Avoid Beta Blockers  . AR (allergic rhinitis)   . Depression   . Dyslipidemia    Past Surgical History  Procedure Laterality Date  . Dilation and curettage of uterus  2003  . Abdominal hysterectomy  2004  . Wisdom tooth extraction  1986   Family History  Problem Relation Age of Onset  . COPD Mother     Living  . Pancreatic cancer Father 5    Deceased  . Lung cancer Paternal Grandfather   . Heart  disease Paternal Grandmother   . Hypertension Paternal Grandmother   . Alzheimer's disease Maternal Grandfather   . Arthritis/Rheumatoid Maternal Grandmother   . Heart disease Maternal Grandmother     Tachycardia  . Cancer Paternal Uncle   . Breast cancer Sister 4  . Healthy Brother     x3  . Healthy Daughter     x3   History  Substance Use Topics  . Smoking status: Never Smoker   . Smokeless tobacco: Never Used  . Alcohol Use: 2.4 oz/week    4 Glasses of wine per week   OB History    No data available     Review of Systems  All other systems reviewed and are negative.  A complete 10 system review of systems was obtained and all systems are negative except as noted in the HPI and PMH.   Allergies  Bee venom; Erythromycin; and Penicillins  Home Medications   Prior to Admission medications   Medication Sig Start Date End Date Taking? Authorizing Provider  amphetamine-dextroamphetamine (ADDERALL) 10 MG tablet Take 1 tablet (10 mg total) by mouth daily. Fill on or after 06/12/2014 09/07/14 10/08/14  Brunetta Jeans, PA-C  estradiol (MINIVELLE) 0.025 MG/24HR Place 1 patch onto the skin 2 (two) times a week. 09/18/14   Brunetta Jeans, PA-C  fexofenadine-pseudoephedrine (ALLEGRA-D 24) 180-240 MG per  24 hr tablet Take 1 tablet by mouth daily.    Historical Provider, MD  fluticasone (FLONASE) 50 MCG/ACT nasal spray Place 1 spray into both nostrils daily.    Historical Provider, MD  lisdexamfetamine (VYVANSE) 40 MG capsule Take 1 capsule (40 mg total) by mouth every morning. 09/07/14   Brunetta Jeans, PA-C  montelukast (SINGULAIR) 10 MG tablet Take 1 tablet (10 mg total) by mouth at bedtime. 01/21/14   Brunetta Jeans, PA-C  Vitamin D, Ergocalciferol, (DRISDOL) 50000 UNITS CAPS capsule Take 1 capsule (50,000 Units total) by mouth every 7 (seven) days. 09/07/14   Brunetta Jeans, PA-C   There were no vitals taken for this visit. Physical Exam  Constitutional: She is oriented to  person, place, and time. She appears well-developed and well-nourished. She appears distressed.  Pale, looks uncomfortable   HENT:  Head: Normocephalic and atraumatic.  Mouth/Throat: Oropharynx is clear and moist. No oropharyngeal exudate.  Eyes: EOM are normal. Pupils are equal, round, and reactive to light.  Neck: Normal range of motion. Neck supple.  Cardiovascular: Regular rhythm and normal heart sounds.  Exam reveals no gallop and no friction rub.   No murmur heard. Tachycardic  Pulmonary/Chest: She has no wheezes. She has no rales.  Tachypneic  Abdominal: Soft. There is no tenderness. There is no rebound and no guarding.  Musculoskeletal: Normal range of motion. She exhibits no edema.  No calf pain or swelling  Neurological: She is alert and oriented to person, place, and time.  Skin: Skin is warm and dry. No rash noted.  Psychiatric: She has a normal mood and affect. Her behavior is normal.  Nursing note and vitals reviewed.   ED Course  Procedures   DIAGNOSTIC STUDIES: Oxygen Saturation is 100% on RA, normal by my interpretation.    COORDINATION OF CARE: 6:10 PM Discussed treatment plan with pt at bedside and pt agreed to plan.   Labs Review Labs Reviewed  CBC WITH DIFFERENTIAL/PLATELET - Abnormal; Notable for the following:    RBC 3.83 (*)    Hemoglobin 11.6 (*)    HCT 35.5 (*)    All other components within normal limits  BASIC METABOLIC PANEL - Abnormal; Notable for the following:    Glucose, Bld 101 (*)    GFR calc non Af Amer 67 (*)    GFR calc Af Amer 78 (*)    Anion gap 4 (*)    All other components within normal limits  D-DIMER, QUANTITATIVE - Abnormal; Notable for the following:    D-Dimer, Quant 0.61 (*)    All other components within normal limits  TROPONIN I    Imaging Review Dg Chest 2 View  11/13/2014   CLINICAL DATA:  Chest pain and pressure since this morning. Shortness of breath with ambulation. Cough last week. Low-grade fever.  EXAM:  CHEST  2 VIEW  COMPARISON:  Rib series 4 11/21/2013  FINDINGS: Heart enlarged. There is prominence of interstitial markings. Kerley B-lines are present consistent with mild edema. There are no focal consolidations. No pleural effusions. Visualized osseous structures have a normal appearance.  IMPRESSION: Cardiomegaly and mild interstitial edema.   Electronically Signed   By: Nolon Nations M.D.   On: 11/13/2014 19:11   Ct Angio Chest Pe W/cm &/or Wo Cm  11/13/2014   CLINICAL DATA:  Shortness of breath. Chest discomfort. Clinical suspicion for pulmonary embolism.  EXAM: CT ANGIOGRAPHY CHEST WITH CONTRAST  TECHNIQUE: Multidetector CT imaging of the chest was performed using  the standard protocol during bolus administration of intravenous contrast. Multiplanar CT image reconstructions and MIPs were obtained to evaluate the vascular anatomy.  CONTRAST:  181mL OMNIPAQUE IOHEXOL 350 MG/ML SOLN  COMPARISON:  None.  FINDINGS: Mediastinum/Lymph Nodes: Satisfactory opacification of pulmonary arteries is noted, and no pulmonary embolism identified. Moderate cardiomegaly noted. No masses or pathologically enlarged lymph nodes identified.  Lungs/Pleura: Tiny pleural effusions present bilaterally. Diffuse thickening of pulmonary interstitial markings is seen which is most pronounced at the lung bases and most likely due to mild interstitial edema. No evidence of pulmonary consolidation or mass.  Musculoskeletal/Soft Tissues: No suspicious bone lesions or other significant chest wall abnormality.  Upper Abdomen:  Unremarkable.  Review of the MIP images confirms the above findings.  IMPRESSION: No evidence of pulmonary embolism.  Cardiomegaly, diffuse interstitial edema pattern, and tiny bilateral pleural effusions, consistent with mild congestive heart failure.   Electronically Signed   By: Earle Gell M.D.   On: 11/13/2014 20:39     Date: 11/13/2014  Rate: 111  Rhythm: sinus tachycardia  QRS Axis: right  Intervals:  normal  ST/T Wave abnormalities: nonspecific ST/T changes  Conduction Disutrbances:nonspecific intraventricular conduction delay  Narrative Interpretation:   Old EKG Reviewed: unchanged    MDM   Final diagnoses:  SOB (shortness of breath)  Acute congestive heart failure, unspecified congestive heart failure type  Cardiomegaly   Patient presents with a complaint of shortness of breath, near-syncope and tightness in her chest with any exertion that started today. She's had URI symptoms over the last 2 weeks but everything worsened today. She has a significant history for a left bundle branch block with a complete cardiac workup approximately 10 years ago with cardiac cath that was normal. She denies any lung disease or cardiac disease. She was on estrogen for hot flashes in [place] the patch a few weeks ago.  EKG today is unchanged from an EKG in December.  Concern for cardiac cause of her symptoms versus PE versus pneumonia.  Low suspicion for dissection or abdominal pathology.  CBC, BMP, troponin, d-dimer, chest x-ray pending  8:18 PM Labs are normal except for an elevated d-dimer. Chest x-ray with unchanged cardiomegaly. CTA to rule out PE pending. Patient given an inhaler due to her shortness of breath and on reevaluation feel that there is mild decreased breath sounds on the left side.  9:19 PM CT shows evidence of pleural effusion and pulmonary edema consistent with new CHF which is most likely a result of patient's cardiomegaly. BMP 655.  Will treat patient with IV Lasix and admit for CHF workup.  Cardiology requesting hospitalist admission and they will follow  I personally performed the services described in this documentation, which was scribed in my presence.  The recorded information has been reviewed and considered.      Debra Dessert, MD 11/13/14 2120  Debra Dessert, MD 11/13/14 2144

## 2014-11-13 NOTE — ED Notes (Signed)
Patient transported to X-ray 

## 2014-11-13 NOTE — ED Notes (Signed)
Placed on oxygen 2l/m [place].

## 2014-11-13 NOTE — Progress Notes (Signed)
Patient states that she is able to take a deeper . breath after taking albuterol MDI.

## 2014-11-13 NOTE — ED Notes (Signed)
Report called to [phone]. [facility] is on their way to transport pt to [facility].

## 2014-11-13 NOTE — ED Notes (Signed)
Chest pain. 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 Since this am she has had pressure in her chest. She feels SOB with ambulation. Pale on arrival. Clutching her chest. Drove herself from work. She lives in [place]. Hx of a productive cough last week with a low grade fever. Hx of left BBB. Normal heart cath 10 years ago.

## 2014-11-13 NOTE — ED Notes (Signed)
Her brother brought her food

## 2014-11-14 DIAGNOSIS — R0789 Other chest pain: Secondary | ICD-10-CM | POA: Diagnosis present

## 2014-11-14 DIAGNOSIS — I509 Heart failure, unspecified: Secondary | ICD-10-CM

## 2014-11-14 DIAGNOSIS — F909 Attention-deficit hyperactivity disorder, unspecified type: Secondary | ICD-10-CM

## 2014-11-14 DIAGNOSIS — N951 Menopausal and female climacteric states: Secondary | ICD-10-CM

## 2014-11-14 DIAGNOSIS — R0602 Shortness of breath: Secondary | ICD-10-CM

## 2014-11-14 DIAGNOSIS — I447 Left bundle-branch block, unspecified: Secondary | ICD-10-CM | POA: Diagnosis present

## 2014-11-14 DIAGNOSIS — I5021 Acute systolic (congestive) heart failure: Secondary | ICD-10-CM | POA: Diagnosis present

## 2014-11-14 DIAGNOSIS — E785 Hyperlipidemia, unspecified: Secondary | ICD-10-CM | POA: Diagnosis present

## 2014-11-14 LAB — CBC WITH DIFFERENTIAL/PLATELET
BASOS ABS: 0.1 10*3/uL (ref 0.0–0.1)
Basophils Relative: 1 % (ref 0–1)
EOS ABS: 0.1 10*3/uL (ref 0.0–0.7)
Eosinophils Relative: 3 % (ref 0–5)
HCT: 33.2 % — ABNORMAL LOW (ref 36.0–46.0)
HEMOGLOBIN: 10.9 g/dL — AB (ref 12.0–15.0)
LYMPHS ABS: 1.5 10*3/uL (ref 0.7–4.0)
LYMPHS PCT: 30 % (ref 12–46)
MCH: 29.9 pg (ref 26.0–34.0)
MCHC: 32.8 g/dL (ref 30.0–36.0)
MCV: 91.2 fL (ref 78.0–100.0)
Monocytes Absolute: 0.4 10*3/uL (ref 0.1–1.0)
Monocytes Relative: 8 % (ref 3–12)
NEUTROS PCT: 58 % (ref 43–77)
Neutro Abs: 3 10*3/uL (ref 1.7–7.7)
Platelets: 241 10*3/uL (ref 150–400)
RBC: 3.64 MIL/uL — ABNORMAL LOW (ref 3.87–5.11)
RDW: 13.4 % (ref 11.5–15.5)
WBC: 5 10*3/uL (ref 4.0–10.5)

## 2014-11-14 LAB — INFLUENZA PANEL BY PCR (TYPE A & B)
H1N1 flu by pcr: NOT DETECTED
INFLBPCR: NEGATIVE
Influenza A By PCR: NEGATIVE

## 2014-11-14 LAB — COMPREHENSIVE METABOLIC PANEL
ALK PHOS: 57 U/L (ref 39–117)
ALT: 31 U/L (ref 0–35)
ANION GAP: 7 (ref 5–15)
AST: 30 U/L (ref 0–37)
Albumin: 3.7 g/dL (ref 3.5–5.2)
BUN: 12 mg/dL (ref 6–23)
CHLORIDE: 107 mmol/L (ref 96–112)
CO2: 26 mmol/L (ref 19–32)
CREATININE: 0.98 mg/dL (ref 0.50–1.10)
Calcium: 8.8 mg/dL (ref 8.4–10.5)
GFR calc non Af Amer: 63 mL/min — ABNORMAL LOW (ref >90)
GFR, EST AFRICAN AMERICAN: 73 mL/min — AB (ref >90)
GLUCOSE: 101 mg/dL — AB (ref 70–99)
Potassium: 3.8 mmol/L (ref 3.5–5.1)
Sodium: 140 mmol/L (ref 135–145)
Total Bilirubin: 1.2 mg/dL (ref 0.3–1.2)
Total Protein: 6.5 g/dL (ref 6.0–8.3)

## 2014-11-14 LAB — TROPONIN I
Troponin I: <0.03 ng/mL (ref <0.031)
Troponin I: <0.03 ng/mL (ref <0.031)

## 2014-11-14 LAB — RAPID STREP SCREEN (MED CTR MEBANE ONLY): Streptococcus, Group A Screen (Direct): NEGATIVE

## 2014-11-14 LAB — TSH: TSH: 2.861 u[IU]/mL (ref 0.350–4.500)

## 2014-11-14 LAB — STREP PNEUMONIAE URINARY ANTIGEN: STREP PNEUMO URINARY ANTIGEN: NEGATIVE

## 2014-11-14 MED ORDER — METOPROLOL SUCCINATE 12.5 MG HALF TABLET
12.5000 mg | ORAL_TABLET | Freq: Every day | ORAL | Status: DC
  Administered 2014-11-14 – 2014-11-19 (×4): 12.5 mg via ORAL
  Filled 2014-11-14 (×6): qty 1

## 2014-11-14 MED ORDER — ASPIRIN EC 325 MG PO TBEC
325.0000 mg | DELAYED_RELEASE_TABLET | Freq: Every day | ORAL | Status: DC
  Administered 2014-11-14 – 2014-11-16 (×3): 325 mg via ORAL
  Filled 2014-11-14 (×4): qty 1

## 2014-11-14 MED ORDER — ONDANSETRON HCL 4 MG/2ML IJ SOLN: 4.0000 mg | Freq: Four times a day (QID) | INTRAMUSCULAR | Status: DC | PRN

## 2014-11-14 MED ORDER — AMPHETAMINE-DEXTROAMPHETAMINE 10 MG PO TABS
10.0000 mg | ORAL_TABLET | Freq: Every day | ORAL | Status: DC
  Administered 2014-11-14: 10 mg via ORAL
  Filled 2014-11-14 (×3): qty 1

## 2014-11-14 MED ORDER — ACETAMINOPHEN 325 MG PO TABS
650.0000 mg | ORAL_TABLET | ORAL | Status: DC | PRN
  Filled 2014-11-14: qty 2

## 2014-11-14 MED ORDER — SODIUM CHLORIDE 0.9 % IJ SOLN
3.0000 mL | Freq: Two times a day (BID) | INTRAMUSCULAR | Status: DC
  Administered 2014-11-14 – 2014-11-19 (×9): 3 mL via INTRAVENOUS

## 2014-11-14 MED ORDER — LORATADINE 10 MG PO TABS
10.0000 mg | ORAL_TABLET | Freq: Every day | ORAL | Status: DC
  Administered 2014-11-14 – 2014-11-19 (×6): 10 mg via ORAL
  Filled 2014-11-14 (×6): qty 1

## 2014-11-14 MED ORDER — ALBUTEROL SULFATE (2.5 MG/3ML) 0.083% IN NEBU: 3.0000 mL | INHALATION_SOLUTION | RESPIRATORY_TRACT | Status: DC | PRN

## 2014-11-14 MED ORDER — FUROSEMIDE 40 MG PO TABS
40.0000 mg | ORAL_TABLET | Freq: Every day | ORAL | Status: DC
  Administered 2014-11-14 – 2014-11-19 (×6): 40 mg via ORAL
  Filled 2014-11-14 (×6): qty 1

## 2014-11-14 MED ORDER — SODIUM CHLORIDE 0.9 % IV SOLN
250.0000 mL | INTRAVENOUS | Status: DC | PRN
Start: 2014-11-14 — End: 2014-11-19

## 2014-11-14 MED ORDER — MONTELUKAST SODIUM 10 MG PO TABS
10.0000 mg | ORAL_TABLET | Freq: Every day | ORAL | Status: DC
  Administered 2014-11-14 – 2014-11-18 (×6): 10 mg via ORAL
  Filled 2014-11-14 (×7): qty 1

## 2014-11-14 MED ORDER — DM-GUAIFENESIN ER 30-600 MG PO TB12
1.0000 | ORAL_TABLET | Freq: Two times a day (BID) | ORAL | Status: DC
Start: 2014-11-14 — End: 2014-11-19
  Administered 2014-11-14 – 2014-11-19 (×11): 1 via ORAL
  Filled 2014-11-14 (×14): qty 1

## 2014-11-14 MED ORDER — ALPRAZOLAM 0.25 MG PO TABS: 0.2500 mg | ORAL_TABLET | Freq: Two times a day (BID) | ORAL | Status: DC | PRN

## 2014-11-14 MED ORDER — LISDEXAMFETAMINE DIMESYLATE 20 MG PO CAPS
40.0000 mg | ORAL_CAPSULE | Freq: Every day | ORAL | Status: DC
  Administered 2014-11-14: 40 mg via ORAL
  Filled 2014-11-14 (×2): qty 2

## 2014-11-14 MED ORDER — MENTHOL 3 MG MT LOZG
1.0000 | LOZENGE | OROMUCOSAL | Status: DC | PRN
  Administered 2014-11-14: 3 mg via ORAL
  Filled 2014-11-14: qty 9

## 2014-11-14 MED ORDER — ENOXAPARIN SODIUM 40 MG/0.4ML ~~LOC~~ SOLN
40.0000 mg | SUBCUTANEOUS | Status: DC
  Administered 2014-11-14 – 2014-11-16 (×3): 40 mg via SUBCUTANEOUS
  Filled 2014-11-14 (×3): qty 0.4

## 2014-11-14 MED ORDER — SODIUM CHLORIDE 0.9 % IJ SOLN: 3.0000 mL | INTRAMUSCULAR | Status: DC | PRN

## 2014-11-14 MED ORDER — OXYMETAZOLINE HCL 0.05 % NA SOLN
1.0000 | Freq: Two times a day (BID) | NASAL | Status: DC | PRN
  Filled 2014-11-14: qty 15

## 2014-11-14 MED ORDER — FLUTICASONE PROPIONATE 50 MCG/ACT NA SUSP
1.0000 | Freq: Every day | NASAL | Status: DC
  Administered 2014-11-14 – 2014-11-17 (×4): 1 via NASAL
  Filled 2014-11-14: qty 16

## 2014-11-14 NOTE — H&P (Signed)
[facility] History and Physical  Patient: Debra Holloway  MRN: [id number]  DOB: Jun 27, 1956  DOS: the patient was seen and examined on 11/14/2014 PCP: Leeanne Rio, PA-C  Chief Complaint: Shortness of breath  HPI: Debra Holloway is a 59 y.o. female with Past medical history of ADD, anxiety disorder, left bundle branch block with normal cath, depression. The patient presented with complaints of shortness of breath. She mentions since last one week she has been having upper respiratory symptoms. She initially started with headache with sinusitis later on had a runny nose and cough with changes in her voice. Later on the chest started hurting in the lower chest when she was trying to take a deep breath along with some cough. She also started having complains of orthopnea and has been sleeping with a problem of bed. She started having PND over last 2 days. Yesterday when she woke up she was having significantly hard time walking around with shortness of breath and doing her regular activity. And therefore she went to the ER. She has also noted some swelling of her legs. Denies any nausea or vomiting denies any sick contact denies any travel outside of the state's. She mentions she had history of panic attacks and was seen at [facility] where she was found to have left bundle branch block with chest tightness and had undergone a cath which was normal. She is not taking any aspirin on a daily basis.   The patient is coming from home. And at her baseline independent for most of her ADL.  Review of Systems: as mentioned in the history of present illness.  A Comprehensive review of the other systems is negative.  Past Medical History  Diagnosis Date  . ADD (attention deficit disorder)   . Environmental allergies   . Chicken pox   . H/O vitamin D deficiency   . Seasonal affective disorder   . LBBB (left bundle branch block)     W/1st degree AV Block, Avoid Beta Blockers  . AR  (allergic rhinitis)   . Depression   . Dyslipidemia    Past Surgical History  Procedure Laterality Date  . Dilation and curettage of uterus  2003  . Abdominal hysterectomy  2004  . Wisdom tooth extraction  1986   Social History:  reports that she has never smoked. She has never used smokeless tobacco. She reports that she drinks about 2.4 oz of alcohol per week. She reports that she does not use illicit drugs.  Allergies  Allergen Reactions  . Bee Venom Anaphylaxis  . Erythromycin Itching and Rash  . Penicillins Hives, Itching and Rash    Family History  Problem Relation Age of Onset  . COPD Mother     Living  . Pancreatic cancer Father 21    Deceased  . Lung cancer Paternal Grandfather   . Heart disease Paternal Grandmother   . Hypertension Paternal Grandmother   . Alzheimer's disease Maternal Grandfather   . Arthritis/Rheumatoid Maternal Grandmother   . Heart disease Maternal Grandmother     Tachycardia  . Cancer Paternal Uncle   . Breast cancer Sister 29  . Healthy Brother     x3  . Healthy Daughter     x3    Prior to Admission medications   Medication Sig Start Date End Date Taking? Authorizing Provider  amphetamine-dextroamphetamine (ADDERALL) 10 MG tablet Take 1 tablet (10 mg total) by mouth daily. Fill on or after 06/12/2014 09/07/14 10/08/14  Brunetta Jeans, PA-C  estradiol (MINIVELLE) 0.025 MG/24HR Place 1 patch onto the skin 2 (two) times a week. 09/18/14   Brunetta Jeans, PA-C  fexofenadine-pseudoephedrine (ALLEGRA-D 24) 180-240 MG per 24 hr tablet Take 1 tablet by mouth daily.    Historical Provider, MD  fluticasone (FLONASE) 50 MCG/ACT nasal spray Place 1 spray into both nostrils daily.    Historical Provider, MD  lisdexamfetamine (VYVANSE) 40 MG capsule Take 1 capsule (40 mg total) by mouth every morning. 09/07/14   Brunetta Jeans, PA-C  montelukast (SINGULAIR) 10 MG tablet Take 1 tablet (10 mg total) by mouth at bedtime. 01/21/14   Brunetta Jeans, PA-C   Vitamin D, Ergocalciferol, (DRISDOL) 50000 UNITS CAPS capsule Take 1 capsule (50,000 Units total) by mouth every 7 (seven) days. 09/07/14   Brunetta Jeans, PA-C    Physical Exam: Filed Vitals:   11/13/14 2100 11/13/14 2204 11/13/14 2235 11/13/14 2326  BP: 123/75 136/72 132/81   Pulse: 98 99 103   Temp:  98.9 F (37.2 C)    TempSrc:  Oral    Resp: 21 20 21    Height:    5\' 6"  (1.676 m)  Weight:    90.7 kg (199 lb 15.3 oz)  SpO2: 100% 98% 99%     General: Alert, Awake and Oriented to Time, Place and Person. Appear in mild distress Eyes: PERRL ENT: Oral Mucosa clear moist. Neck: no JVD Cardiovascular: S1 and S2 Present, no Murmur, Peripheral Pulses Present Respiratory: Bilateral Air entry equal and Decreased, Clear to Auscultation, noCrackles, no wheezes Abdomen: Bowel Sound present, Soft and non tender Skin: no Rash Extremities: no Pedal edema, no calf tenderness Neurologic: Grossly no focal neuro deficit.  Labs on Admission:  CBC:  Recent Labs Lab 11/13/14 1805  WBC 7.0  NEUTROABS 4.5  HGB 11.6*  HCT 35.5*  MCV 92.7  PLT 235    CMP     Component Value Date/Time   NA 137 11/13/2014 1805   K 3.9 11/13/2014 1805   CL 112 11/13/2014 1805   CO2 21 11/13/2014 1805   GLUCOSE 101* 11/13/2014 1805   BUN 19 11/13/2014 1805   CREATININE 0.93 11/13/2014 1805   CALCIUM 8.7 11/13/2014 1805   PROT 7.1 09/07/2014 0853   ALBUMIN 4.1 09/07/2014 0853   AST 17 09/07/2014 0853   ALT 14 09/07/2014 0853   ALKPHOS 50 09/07/2014 0853   BILITOT 0.8 09/07/2014 0853   GFRNONAA 67* 11/13/2014 1805   GFRAA 78* 11/13/2014 1805    No results for input(s): LIPASE, AMYLASE in the last 168 hours.   Recent Labs Lab 11/13/14 1805  TROPONINI <0.03   BNP (last 3 results)  Recent Labs  11/13/14 1810  BNP 655.6*    ProBNP (last 3 results) No results for input(s): PROBNP in the last 8760 hours.   Radiological Exams on Admission: Dg Chest 2 View  11/13/2014   CLINICAL DATA:   Chest pain and pressure since this morning. Shortness of breath with ambulation. Cough last week. Low-grade fever.  EXAM: CHEST  2 VIEW  COMPARISON:  Rib series 4 11/21/2013  FINDINGS: Heart enlarged. There is prominence of interstitial markings. Kerley B-lines are present consistent with mild edema. There are no focal consolidations. No pleural effusions. Visualized osseous structures have a normal appearance.  IMPRESSION: Cardiomegaly and mild interstitial edema.   Electronically Signed   By: Nolon Nations M.D.   On: 11/13/2014 19:11   Ct Angio Chest Pe W/cm &/or Wo Cm  11/13/2014  CLINICAL DATA:  Shortness of breath. Chest discomfort. Clinical suspicion for pulmonary embolism.  EXAM: CT ANGIOGRAPHY CHEST WITH CONTRAST  TECHNIQUE: Multidetector CT imaging of the chest was performed using the standard protocol during bolus administration of intravenous contrast. Multiplanar CT image reconstructions and MIPs were obtained to evaluate the vascular anatomy.  CONTRAST:  186mL OMNIPAQUE IOHEXOL 350 MG/ML SOLN  COMPARISON:  None.  FINDINGS: Mediastinum/Lymph Nodes: Satisfactory opacification of pulmonary arteries is noted, and no pulmonary embolism identified. Moderate cardiomegaly noted. No masses or pathologically enlarged lymph nodes identified.  Lungs/Pleura: Tiny pleural effusions present bilaterally. Diffuse thickening of pulmonary interstitial markings is seen which is most pronounced at the lung bases and most likely due to mild interstitial edema. No evidence of pulmonary consolidation or mass.  Musculoskeletal/Soft Tissues: No suspicious bone lesions or other significant chest wall abnormality.  Upper Abdomen:  Unremarkable.  Review of the MIP images confirms the above findings.  IMPRESSION: No evidence of pulmonary embolism.  Cardiomegaly, diffuse interstitial edema pattern, and tiny bilateral pleural effusions, consistent with mild congestive heart failure.   Electronically Signed   By: Earle Gell  M.D.   On: 11/13/2014 20:39   EKG: Independently reviewed. normal sinus rhythm, LBBB.  Assessment/Plan Principal Problem:   Acute CHF (congestive heart failure) Active Problems:   ADD (attention deficit disorder)   Hot flashes, menopausal   LBBB (left bundle branch block)   Dyslipidemia   Chest tightness   1. Acute CHF (congestive heart failure) The patient is presenting with complaints of cough progressively worsening shortness of breath orthopnea and PND. Her BNP is elevated chest x-ray and a CT scan shows vascular congestion. Her d-dimer is elevated but the CT scan is negative for PE. She has symptoms of infectious etiology although her white count is not elevated and she does not have any crackles which is focal or does not have any wheezing on exam. With this the patient will be admitted in the hospital as a possible acute unspecified CHF. We will monitor her ins and outs and obtain echo program in the morning. Monitor on telemetry monitor serial troponin. Start her on Toprol-XL. If her blood pressure is stable and tolerating it and it can also add ACE inhibitor.  2. ADD. Patient is taking medications for ADD can also cause of ischemic symptoms. We'll continue to closely monitor.  3. Upper respiratory symptoms. We will add Flonase and albuterol inhalers. Continue with Singulair. Add Cepacol lozenges. Follow urine antigens and influenza culture. Check rapid strep.  Advance goals of care discussion: Full code   DVT Prophylaxis: subcutaneous Heparin Nutrition: Nothing by mouth except medication  Disposition: Admitted to inpatient in telemetry unit.  Author: Berle Mull, MD [facility] Pager: [phone] 11/14/2014, 12:18 AM    If 7PM-7AM, please contact night-coverage [email or web address] Password [email or web address]

## 2014-11-14 NOTE — Progress Notes (Signed)
Pt arrived via via [facility] from [facility]. Alert and oriented x4. Pt placed on tele. Admitting physician paged on arrival of pt. Oriented to room and equipment. VSS. Will continue to monitor.

## 2014-11-14 NOTE — Consult Note (Cosign Needed)
CARDIOLOGY CONSULT NOTE   Patient ID: Debra Holloway MRN: [id number], DOB/AGE: November 04, 1955   Admit date: 11/13/2014 Date of Consult: 11/14/2014   Primary Physician: Leeanne Rio, PA-C Primary Cardiologist: None  CC: Shortness of breath  Problem List  Past Medical History  Diagnosis Date  . ADD (attention deficit disorder)   . Environmental allergies   . Chicken pox   . H/O vitamin D deficiency   . Seasonal affective disorder   . LBBB (left bundle branch block)     W/1st degree AV Block, Avoid Beta Blockers  . AR (allergic rhinitis)   . Depression   . Dyslipidemia     Past Surgical History  Procedure Laterality Date  . Dilation and curettage of uterus  2003  . Abdominal hysterectomy  2004  . Wisdom tooth extraction  1986     Allergies  Allergies  Allergen Reactions  . Bee Venom Anaphylaxis  . Erythromycin Itching and Rash  . Penicillins Hives, Itching and Rash    HPI  The patient is a 37F with a history of HLD who was in her usual state of health 1-2 weeks ago when she developed a URI. She reported that her symptoms initially improved over 3-4 days but then deteriorated to the point where she became increasingly dyspneic on exertion. She described orthopnea and PND, which she had initially attributed to her nasal congestion. Earlier today, she reported that she became markedly SOB walking down the stairs from her home. She works as a Radio broadcast assistant and became dyspneic when walking to her car. As a result, she drove to [place] for additional evaluation.   There, she was reported to be volume overloaded and was diuresed with Lasix IV. CXR showed pulmonary congestion, and CT-PA was negative for PE. She is admitted to [facility] for new onset CHF. We are consulted for additional evaluation and management.   Her only prior cardiac history is a longstanding presence of LBBB, first diagnosed around 2004. She underwent cath at [facility] that was reportedly  negative. She has not undergone TTE recently. She does not have any family history of cardiomyopathy or SCD.  Inpatient Medications  . amphetamine-dextroamphetamine  10 mg Oral Q1200  . aspirin EC  325 mg Oral Daily  . dextromethorphan-guaiFENesin  1 tablet Oral BID  . enoxaparin (LOVENOX) injection  40 mg Subcutaneous Q24H  . fluticasone  1 spray Each Nare Daily  . lisdexamfetamine  40 mg Oral Daily  . metoprolol succinate  12.5 mg Oral Daily  . montelukast  10 mg Oral QHS  . sodium chloride  3 mL Intravenous Q12H    Family History Family History  Problem Relation Age of Onset  . COPD Mother     Living  . Pancreatic cancer Father 14    Deceased  . Lung cancer Paternal Grandfather   . Heart disease Paternal Grandmother   . Hypertension Paternal Grandmother   . Alzheimer's disease Maternal Grandfather   . Arthritis/Rheumatoid Maternal Grandmother   . Heart disease Maternal Grandmother     Tachycardia  . Cancer Paternal Uncle   . Breast cancer Sister 34  . Healthy Brother     x3  . Healthy Daughter     x3     Social History History   Social History  . Marital Status: Legally Separated    Spouse Name: N/A  . Number of Children: N/A  . Years of Education: N/A   Occupational History  . Not on file.  Social History Main Topics  . Smoking status: Never Smoker   . Smokeless tobacco: Never Used  . Alcohol Use: 2.4 oz/week    4 Glasses of wine per week  . Drug Use: No  . Sexual Activity: Not on file   Other Topics Concern  . Not on file   Social History Narrative     Review of Systems  General:  No chills, fever, night sweats or weight changes.  Cardiovascular:  +chest pain, +dyspnea on exertion, -edema, +orthopnea, +palpitations, +paroxysmal nocturnal dyspnea. Dermatological: No rash, lesions/masses Respiratory: +cough, +dyspnea Urologic: No hematuria, dysuria Abdominal:   No nausea, vomiting, diarrhea, bright red blood per rectum, melena, or  hematemesis Neurologic:  No visual changes, wkns, changes in mental status. All other systems reviewed and are otherwise negative except as noted above.  Physical Exam  Blood pressure 132/81, pulse 103, temperature 98.9 F (37.2 C), temperature source Oral, resp. rate 21, height 5\' 6"  (1.676 m), weight 199 lb 15.3 oz (90.7 kg), SpO2 99 %.  General: Pleasant, NAD Psych: Normal affect. Neuro: Alert and oriented X 3. Moves all extremities spontaneously. HEENT: Normal  Neck: Supple without bruits or JVD. (She had already received lasix by the time I examined her) Lungs:  Resp regular and unlabored, CTA. Heart: RRR no s3, s4, or murmurs. Abdomen: Soft, non-tender, non-distended, BS + x 4.  Extremities: No clubbing, cyanosis or edema. DP/PT/Radials 2+ and equal bilaterally.  Labs   Recent Labs  11/13/14 1805 11/14/14 0055  TROPONINI <0.03 <0.03   Lab Results  Component Value Date   WBC 7.0 11/13/2014   HGB 11.6* 11/13/2014   HCT 35.5* 11/13/2014   MCV 92.7 11/13/2014   PLT 235 11/13/2014    Recent Labs Lab 11/13/14 1805  NA 137  K 3.9  CL 112  CO2 21  BUN 19  CREATININE 0.93  CALCIUM 8.7  GLUCOSE 101*   Lab Results  Component Value Date   CHOL 225* 09/07/2014   HDL 69.60 09/07/2014   LDLCALC 136* 09/07/2014   TRIG 95.0 09/07/2014   Lab Results  Component Value Date   DDIMER 0.61* 11/13/2014    Radiology/Studies  Dg Chest 2 View  11/13/2014   CLINICAL DATA:  Chest pain and pressure since this morning. Shortness of breath with ambulation. Cough last week. Low-grade fever.  EXAM: CHEST  2 VIEW  COMPARISON:  Rib series 4 11/21/2013  FINDINGS: Heart enlarged. There is prominence of interstitial markings. Kerley B-lines are present consistent with mild edema. There are no focal consolidations. No pleural effusions. Visualized osseous structures have a normal appearance.  IMPRESSION: Cardiomegaly and mild interstitial edema.   Electronically Signed   By: Nolon Nations M.D.   On: 11/13/2014 19:11   Ct Angio Chest Pe W/cm &/or Wo Cm  11/13/2014   CLINICAL DATA:  Shortness of breath. Chest discomfort. Clinical suspicion for pulmonary embolism.  EXAM: CT ANGIOGRAPHY CHEST WITH CONTRAST  TECHNIQUE: Multidetector CT imaging of the chest was performed using the standard protocol during bolus administration of intravenous contrast. Multiplanar CT image reconstructions and MIPs were obtained to evaluate the vascular anatomy.  CONTRAST:  160mL OMNIPAQUE IOHEXOL 350 MG/ML SOLN  COMPARISON:  None.  FINDINGS: Mediastinum/Lymph Nodes: Satisfactory opacification of pulmonary arteries is noted, and no pulmonary embolism identified. Moderate cardiomegaly noted. No masses or pathologically enlarged lymph nodes identified.  Lungs/Pleura: Tiny pleural effusions present bilaterally. Diffuse thickening of pulmonary interstitial markings is seen which is most pronounced at the lung bases  and most likely due to mild interstitial edema. No evidence of pulmonary consolidation or mass.  Musculoskeletal/Soft Tissues: No suspicious bone lesions or other significant chest wall abnormality.  Upper Abdomen:  Unremarkable.  Review of the MIP images confirms the above findings.  IMPRESSION: No evidence of pulmonary embolism.  Cardiomegaly, diffuse interstitial edema pattern, and tiny bilateral pleural effusions, consistent with mild congestive heart failure.   Electronically Signed   By: Earle Gell M.D.   On: 11/13/2014 20:39    ECG  [id number]@97bpm , LBBB with occasional PVCs  ASSESSMENT AND PLAN 1. Shortness of breath concerning for acute congestive heart failure 2. Chronic LBBB 3. Dyslipidemia  59F with a history of HLD with recent URI symptoms and reported volume overload earlier today. Her history of concerning for new congestive heart failure. She has evidence of cardiomegaly on CXR and CT and evidence of volume overload on her CXR. She doesn't have any traditional risk factors for  obstructive CAD and had a prior cath about 10 years ago that did not demonstrate disease. If her TTE in fact confirms cardiomyopathy with low EF would explore etiologies of nonischemic CMP, though she likely will need either noninvasive or invasive assessment to exclude CAD. At this point will defer further workup until TTE to delineate further course -Please obtain TTE in the AM - this will guide further management -At this point she is euvolemic. Would not pursue additional diuresis at this time but will continue to monitor. -Recent TSH 3.75; recent lipids TC 225, HDL 70, LDL 136. NICMP confirmed would broaden workup to other infectious/autoimmune conditions -Can consider BB/ACEi based on TTE findings -please measure I/Os, daily weights  Signed, Raliegh Ip, MD MPH 11/14/2014, 2:09 AM

## 2014-11-14 NOTE — Progress Notes (Signed)
PATIENT DETAILS Name: Debra Holloway Age: 59 y.o. Sex: female Date of Birth: November 04, 1955 Admit Date: 11/13/2014 Admitting Physician Berle Mull, MD GLO:VFIEPP, Sandria Manly, PA-C  Subjective: No major complaints-breathing much better  Assessment/Plan: Principal Problem:   Acute CHF (congestive heart failure): Not sure whether this is systolic versus diastolic. Clinically decompensated after getting IV Lasix in the emergency room. Await 2-D echocardiogram, and further recommendations from cardiology.  Active Problems:   URI: Complains of some nasal and sinus congestion. Continue Flonase and loratadine and as needed Afrin. Influenza PCR and strep throat negative.    ADD/anxiety disorder: Stable, continue with Adderall and Xanax.   Disposition: Remain inpatient  Antibiotics:  None   Anti-infectives    None      DVT Prophylaxis: Prophylactic Lovenox   Code Status: Full code   Family Communication None  Procedures:  None  CONSULTS:  cardiology  Time spent 40 minutes-which includes 50% of the time with face-to-face with patient/ family and coordinating care related to the above assessment and plan.  MEDICATIONS: Scheduled Meds: . amphetamine-dextroamphetamine  10 mg Oral Q1200  . aspirin EC  325 mg Oral Daily  . dextromethorphan-guaiFENesin  1 tablet Oral BID  . enoxaparin (LOVENOX) injection  40 mg Subcutaneous Q24H  . fluticasone  1 spray Each Nare Daily  . furosemide  40 mg Oral Daily  . lisdexamfetamine  40 mg Oral Daily  . loratadine  10 mg Oral Daily  . metoprolol succinate  12.5 mg Oral Daily  . montelukast  10 mg Oral QHS  . sodium chloride  3 mL Intravenous Q12H   Continuous Infusions:  PRN Meds:.sodium chloride, acetaminophen, albuterol, ALPRAZolam, menthol-cetylpyridinium, ondansetron (ZOFRAN) IV, oxymetazoline, sodium chloride    PHYSICAL EXAM: Vital signs in last 24 hours: Filed Vitals:   11/14/14 0231 11/14/14 0630 11/14/14  1152 11/14/14 1318  BP: 124/74 115/69 101/59 121/65  Pulse: 97 80  97  Temp: 98.1 F (36.7 C) 98 F (36.7 C)  98.7 F (37.1 C)  TempSrc: Oral Oral  Oral  Resp: 20 18 18 16   Height:      Weight:  89.631 kg (197 lb 9.6 oz)    SpO2: 98% 100% 97% 100%    Weight change:  Filed Weights   11/13/14 1810 11/13/14 2326 11/14/14 0630  Weight: 90.719 kg (200 lb) 90.7 kg (199 lb 15.3 oz) 89.631 kg (197 lb 9.6 oz)   Body mass index is 31.91 kg/(m^2).   Gen Exam: Awake and alert with clear speech. Neck: Supple, No JVD.   Chest: B/L Clear.   CVS: S1 S2 Regular, no murmurs.  Abdomen: soft, BS +, non tender, non distended.  Extremities: no edema, lower extremities warm to touch. Neurologic: Non Focal.   Skin: No Rash.   Wounds: N/A.   Intake/Output from previous day:  Intake/Output Summary (Last 24 hours) at 11/14/14 1421 Last data filed at 11/14/14 1318  Gross per 24 hour  Intake    223 ml  Output   1850 ml  Net  -1627 ml     LAB RESULTS: CBC  Recent Labs Lab 11/13/14 1805 11/14/14 0720  WBC 7.0 5.0  HGB 11.6* 10.9*  HCT 35.5* 33.2*  PLT 235 241  MCV 92.7 91.2  MCH 30.3 29.9  MCHC 32.7 32.8  RDW 13.0 13.4  LYMPHSABS 1.9 1.5  MONOABS 0.4 0.4  EOSABS 0.1 0.1  BASOSABS 0.1 0.1    Chemistries   Recent Labs Lab  11/13/14 1805 11/14/14 0720  NA 137 140  K 3.9 3.8  CL 112 107  CO2 21 26  GLUCOSE 101* 101*  BUN 19 12  CREATININE 0.93 0.98  CALCIUM 8.7 8.8    CBG: No results for input(s): GLUCAP in the last 168 hours.  GFR Estimated Creatinine Clearance: 71.4 mL/min (by C-G formula based on Cr of 0.98).  Coagulation profile No results for input(s): INR, PROTIME in the last 168 hours.  Cardiac Enzymes  Recent Labs Lab 11/14/14 0055 11/14/14 0720 11/14/14 1130  TROPONINI <0.03 <0.03 <0.03    Invalid input(s): POCBNP  Recent Labs  11/13/14 1805  DDIMER 0.61*   No results for input(s): HGBA1C in the last 72 hours. No results for input(s):  CHOL, HDL, LDLCALC, TRIG, CHOLHDL, LDLDIRECT in the last 72 hours.  Recent Labs  11/13/14 2140  TSH 2.861   No results for input(s): VITAMINB12, FOLATE, FERRITIN, TIBC, IRON, RETICCTPCT in the last 72 hours. No results for input(s): LIPASE, AMYLASE in the last 72 hours.  Urine Studies No results for input(s): UHGB, CRYS in the last 72 hours.  Invalid input(s): UACOL, UAPR, USPG, UPH, UTP, UGL, UKET, UBIL, UNIT, UROB, ULEU, UEPI, UWBC, URBC, UBAC, CAST, UCOM, BILUA  MICROBIOLOGY: Recent Results (from the past 240 hour(s))  Rapid strep screen     Status: None   Collection Time: 11/14/14  2:28 AM  Result Value Ref Range Status   Streptococcus, Group A Screen (Direct) NEGATIVE NEGATIVE Final    Comment: (NOTE) A Rapid Antigen test may result negative if the antigen level in the sample is below the detection level of this test. The FDA has not cleared this test as a stand-alone test therefore the rapid antigen negative result has reflexed to a Group A Strep culture.     RADIOLOGY STUDIES/RESULTS: Dg Chest 2 View  11/13/2014   CLINICAL DATA:  Chest pain and pressure since this morning. Shortness of breath with ambulation. Cough last week. Low-grade fever.  EXAM: CHEST  2 VIEW  COMPARISON:  Rib series 4 11/21/2013  FINDINGS: Heart enlarged. There is prominence of interstitial markings. Kerley B-lines are present consistent with mild edema. There are no focal consolidations. No pleural effusions. Visualized osseous structures have a normal appearance.  IMPRESSION: Cardiomegaly and mild interstitial edema.   Electronically Signed   By: Nolon Nations M.D.   On: 11/13/2014 19:11   Ct Angio Chest Pe W/cm &/or Wo Cm  11/13/2014   CLINICAL DATA:  Shortness of breath. Chest discomfort. Clinical suspicion for pulmonary embolism.  EXAM: CT ANGIOGRAPHY CHEST WITH CONTRAST  TECHNIQUE: Multidetector CT imaging of the chest was performed using the standard protocol during bolus administration of  intravenous contrast. Multiplanar CT image reconstructions and MIPs were obtained to evaluate the vascular anatomy.  CONTRAST:  168mL OMNIPAQUE IOHEXOL 350 MG/ML SOLN  COMPARISON:  None.  FINDINGS: Mediastinum/Lymph Nodes: Satisfactory opacification of pulmonary arteries is noted, and no pulmonary embolism identified. Moderate cardiomegaly noted. No masses or pathologically enlarged lymph nodes identified.  Lungs/Pleura: Tiny pleural effusions present bilaterally. Diffuse thickening of pulmonary interstitial markings is seen which is most pronounced at the lung bases and most likely due to mild interstitial edema. No evidence of pulmonary consolidation or mass.  Musculoskeletal/Soft Tissues: No suspicious bone lesions or other significant chest wall abnormality.  Upper Abdomen:  Unremarkable.  Review of the MIP images confirms the above findings.  IMPRESSION: No evidence of pulmonary embolism.  Cardiomegaly, diffuse interstitial edema pattern, and tiny bilateral  pleural effusions, consistent with mild congestive heart failure.   Electronically Signed   By: Earle Gell M.D.   On: 11/13/2014 20:39    Oren Binet, MD  [facility] Pager:[phone]  If 7PM-7AM, please contact night-coverage [email or web address] Password [email or web address] 11/14/2014, 2:21 PM   LOS: 1 day

## 2014-11-14 NOTE — Progress Notes (Signed)
Subjective:  Feeling better today after having been admitted last evening with progressive cough and dyspnea after she had been at work.  Still coughing some.  She thought that she had an upper respiratory infection and stopped by the urgent care in the emergency room on the way home from work yesterday to get an antibiotic.  Prior history of left bundle branch block and no coronary artery disease at catheterization a number of years ago.  Objective:  Vital Signs in the last 24 hours: BP 115/69 mmHg  Pulse 80  Temp(Src) 98 F (36.7 C) (Oral)  Resp 18  Ht 5\' 6"  (1.676 m)  Wt 89.631 kg (197 lb 9.6 oz)  BMI 31.91 kg/m2  SpO2 100%  Physical Exam: Pleasant obese female in no acute distress Lungs:  Clear  Cardiac:  Regular rhythm, normal S1 and S2, no S3 Extremities:  No edema present  Intake/Output from previous day: 02/12 0701 - 02/13 0700 In: -  Out: 1750 [Urine:1750] Weight Filed Weights   11/13/14 1810 11/13/14 2326 11/14/14 0630  Weight: 90.719 kg (200 lb) 90.7 kg (199 lb 15.3 oz) 89.631 kg (197 lb 9.6 oz)    Lab Results: Basic Metabolic Panel:  Recent Labs  11/13/14 1805 11/14/14 0720  NA 137 140  K 3.9 3.8  CL 112 107  CO2 21 26  GLUCOSE 101* 101*  BUN 19 12  CREATININE 0.93 0.98    CBC:  Recent Labs  11/13/14 1805 11/14/14 0720  WBC 7.0 5.0  NEUTROABS 4.5 3.0  HGB 11.6* 10.9*  HCT 35.5* 33.2*  MCV 92.7 91.2  PLT 235 241    BNP    Component Value Date/Time   BNP 655.6* 11/13/2014 1810   Cardiac Panel (last 3 results)  Recent Labs  11/13/14 1805 11/14/14 0055 11/14/14 0720  TROPONINI <0.03 <0.03 <0.03   Telemetry: Sinus rhythm  Assessment/Plan:  1.  Dyspnea with mildly elevated BNP level last night-prior history of left bundle branch block and awaiting echocardiogram to determine whether she has cardiac dysfunction either systolic or diastolic 2.  History of left bundle branch block 3.  Dyspnea and cough unclear whether this is  cardiac or respiratory  Recommendations:  I think she can go ahead and eat today.  Await echocardiogram with further results to follow.  Continue Lasix.     Kerry Hough  MD [facility]  11/14/2014, 9:48 AM

## 2014-11-14 NOTE — Progress Notes (Signed)
Strep screen and Influenza PCR obtained and sent down to lab

## 2014-11-15 DIAGNOSIS — I429 Cardiomyopathy, unspecified: Secondary | ICD-10-CM

## 2014-11-15 DIAGNOSIS — I509 Heart failure, unspecified: Secondary | ICD-10-CM

## 2014-11-15 DIAGNOSIS — I428 Other cardiomyopathies: Secondary | ICD-10-CM

## 2014-11-15 LAB — BASIC METABOLIC PANEL
Anion gap: 8 (ref 5–15)
BUN: 15 mg/dL (ref 6–23)
CO2: 24 mmol/L (ref 19–32)
Calcium: 9.1 mg/dL (ref 8.4–10.5)
Chloride: 107 mmol/L (ref 96–112)
Creatinine, Ser: 0.94 mg/dL (ref 0.50–1.10)
GFR calc non Af Amer: 66 mL/min — ABNORMAL LOW (ref >90)
GFR, EST AFRICAN AMERICAN: 77 mL/min — AB (ref >90)
GLUCOSE: 94 mg/dL (ref 70–99)
Potassium: 4.1 mmol/L (ref 3.5–5.1)
Sodium: 139 mmol/L (ref 135–145)

## 2014-11-15 MED ORDER — LISINOPRIL 5 MG PO TABS
5.0000 mg | ORAL_TABLET | Freq: Every day | ORAL | Status: DC
  Administered 2014-11-15 – 2014-11-18 (×2): 5 mg via ORAL
  Filled 2014-11-15 (×4): qty 1

## 2014-11-15 NOTE — Progress Notes (Signed)
PATIENT DETAILS Name: Debra Holloway Age: 59 y.o. Sex: female Date of Birth: 02/23/1956 Admit Date: 11/13/2014 Admitting Physician Berle Mull, MD [id number], Sandria Manly, PA-C  Subjective: No major complaints-breathing much better  Assessment/Plan: Principal Problem:   Acute systolic CHF (congestive heart failure): Admitted with exertional dyspnea, significantly compensated after getting furosemide. Per cardiology note, 2-D echocardiogram demonstrates significantly reduced ejection fraction of around 20-25%.Plans are for cardiac catheterization in a.m. Currently on lisinopril and Lasix.  Active Problems:   URI: Complains of some nasal and sinus congestion. Continue Flonase and loratadine and as needed Afrin. Influenza PCR and strep throat negative.    ADD/anxiety disorder: Stable, continue with Adderall and Xanax.   Disposition: Remain inpatient  Antibiotics:  None   Anti-infectives    None      DVT Prophylaxis: Prophylactic Lovenox   Code Status: Full code   Family Communication None  Procedures:  None  CONSULTS:  cardiology  MEDICATIONS: Scheduled Meds: . amphetamine-dextroamphetamine  10 mg Oral Q1200  . aspirin EC  325 mg Oral Daily  . dextromethorphan-guaiFENesin  1 tablet Oral BID  . enoxaparin (LOVENOX) injection  40 mg Subcutaneous Q24H  . fluticasone  1 spray Each Nare Daily  . furosemide  40 mg Oral Daily  . lisdexamfetamine  40 mg Oral Daily  . lisinopril  5 mg Oral Daily  . loratadine  10 mg Oral Daily  . metoprolol succinate  12.5 mg Oral Daily  . montelukast  10 mg Oral QHS  . sodium chloride  3 mL Intravenous Q12H   Continuous Infusions:  PRN Meds:.sodium chloride, acetaminophen, albuterol, ALPRAZolam, menthol-cetylpyridinium, ondansetron (ZOFRAN) IV, oxymetazoline, sodium chloride    PHYSICAL EXAM: Vital signs in last 24 hours: Filed Vitals:   11/14/14 1152 11/14/14 1318 11/14/14 1957 11/15/14 0549  BP: 101/59 121/65  107/59 103/56  Pulse:  97 78 74  Temp:  98.7 F (37.1 C) 98.7 F (37.1 C) 97.9 F (36.6 C)  TempSrc:  Oral Oral Oral  Resp: 18 16 16 18   Height:      Weight:    88.542 kg (195 lb 3.2 oz)  SpO2: 97% 100% 99% 99%    Weight change: -2.177 kg (-4 lb 12.8 oz) Filed Weights   11/13/14 2326 11/14/14 0630 11/15/14 0549  Weight: 90.7 kg (199 lb 15.3 oz) 89.631 kg (197 lb 9.6 oz) 88.542 kg (195 lb 3.2 oz)   Body mass index is 31.52 kg/(m^2).   Gen Exam: Awake and alert with clear speech. Neck: Supple, No JVD.   Chest: B/L Clear.  No rales or rhonchi CVS: S1 S2 Regular, no murmurs.  Abdomen: soft, BS +, non tender, non distended.  Extremities: no edema, lower extremities warm to touch. Neurologic: Non Focal.   Skin: No Rash.   Wounds: N/A.   Intake/Output from previous day:  Intake/Output Summary (Last 24 hours) at 11/15/14 1122 Last data filed at 11/15/14 1013  Gross per 24 hour  Intake    923 ml  Output   1451 ml  Net   -528 ml     LAB RESULTS: CBC  Recent Labs Lab 11/13/14 1805 11/14/14 0720  WBC 7.0 5.0  HGB 11.6* 10.9*  HCT 35.5* 33.2*  PLT 235 241  MCV 92.7 91.2  MCH 30.3 29.9  MCHC 32.7 32.8  RDW 13.0 13.4  LYMPHSABS 1.9 1.5  MONOABS 0.4 0.4  EOSABS 0.1 0.1  BASOSABS 0.1 0.1    Chemistries   Recent  Labs Lab 11/13/14 1805 11/14/14 0720 11/15/14 0506  NA 137 140 139  K 3.9 3.8 4.1  CL 112 107 107  CO2 21 26 24   GLUCOSE 101* 101* 94  BUN 19 12 15   CREATININE 0.93 0.98 0.94  CALCIUM 8.7 8.8 9.1    CBG: No results for input(s): GLUCAP in the last 168 hours.  GFR Estimated Creatinine Clearance: 74 mL/min (by C-G formula based on Cr of 0.94).  Coagulation profile No results for input(s): INR, PROTIME in the last 168 hours.  Cardiac Enzymes  Recent Labs Lab 11/14/14 0055 11/14/14 0720 11/14/14 1130  TROPONINI <0.03 <0.03 <0.03    Invalid input(s): POCBNP  Recent Labs  11/13/14 1805  DDIMER 0.61*   No results for input(s):  HGBA1C in the last 72 hours. No results for input(s): CHOL, HDL, LDLCALC, TRIG, CHOLHDL, LDLDIRECT in the last 72 hours.  Recent Labs  11/13/14 2140  TSH 2.861   No results for input(s): VITAMINB12, FOLATE, FERRITIN, TIBC, IRON, RETICCTPCT in the last 72 hours. No results for input(s): LIPASE, AMYLASE in the last 72 hours.  Urine Studies No results for input(s): UHGB, CRYS in the last 72 hours.  Invalid input(s): UACOL, UAPR, USPG, UPH, UTP, UGL, UKET, UBIL, UNIT, UROB, ULEU, UEPI, UWBC, URBC, UBAC, CAST, UCOM, BILUA  MICROBIOLOGY: Recent Results (from the past 240 hour(s))  Rapid strep screen     Status: None   Collection Time: 11/14/14  2:28 AM  Result Value Ref Range Status   Streptococcus, Group A Screen (Direct) NEGATIVE NEGATIVE Final    Comment: (NOTE) A Rapid Antigen test may result negative if the antigen level in the sample is below the detection level of this test. The FDA has not cleared this test as a stand-alone test therefore the rapid antigen negative result has reflexed to a Group A Strep culture.     RADIOLOGY STUDIES/RESULTS: Dg Chest 2 View  11/13/2014   CLINICAL DATA:  Chest pain and pressure since this morning. Shortness of breath with ambulation. Cough last week. Low-grade fever.  EXAM: CHEST  2 VIEW  COMPARISON:  Rib series 4 11/21/2013  FINDINGS: Heart enlarged. There is prominence of interstitial markings. Kerley B-lines are present consistent with mild edema. There are no focal consolidations. No pleural effusions. Visualized osseous structures have a normal appearance.  IMPRESSION: Cardiomegaly and mild interstitial edema.   Electronically Signed   By: Nolon Nations M.D.   On: 11/13/2014 19:11   Ct Angio Chest Pe W/cm &/or Wo Cm  11/13/2014   CLINICAL DATA:  Shortness of breath. Chest discomfort. Clinical suspicion for pulmonary embolism.  EXAM: CT ANGIOGRAPHY CHEST WITH CONTRAST  TECHNIQUE: Multidetector CT imaging of the chest was performed using  the standard protocol during bolus administration of intravenous contrast. Multiplanar CT image reconstructions and MIPs were obtained to evaluate the vascular anatomy.  CONTRAST:  127mL OMNIPAQUE IOHEXOL 350 MG/ML SOLN  COMPARISON:  None.  FINDINGS: Mediastinum/Lymph Nodes: Satisfactory opacification of pulmonary arteries is noted, and no pulmonary embolism identified. Moderate cardiomegaly noted. No masses or pathologically enlarged lymph nodes identified.  Lungs/Pleura: Tiny pleural effusions present bilaterally. Diffuse thickening of pulmonary interstitial markings is seen which is most pronounced at the lung bases and most likely due to mild interstitial edema. No evidence of pulmonary consolidation or mass.  Musculoskeletal/Soft Tissues: No suspicious bone lesions or other significant chest wall abnormality.  Upper Abdomen:  Unremarkable.  Review of the MIP images confirms the above findings.  IMPRESSION: No evidence  of pulmonary embolism.  Cardiomegaly, diffuse interstitial edema pattern, and tiny bilateral pleural effusions, consistent with mild congestive heart failure.   Electronically Signed   By: Earle Gell M.D.   On: 11/13/2014 20:39    Oren Binet, MD  [facility] Pager:[phone]  If 7PM-7AM, please contact night-coverage [email or web address] Password [email or web address] 11/15/2014, 11:22 AM   LOS: 2 days

## 2014-11-15 NOTE — Progress Notes (Signed)
  Echocardiogram 2D Echocardiogram has been performed.  Debra Holloway M 11/15/2014, 10:01 AM

## 2014-11-15 NOTE — Progress Notes (Addendum)
Subjective:  Feeling better today but still dyspneic when she gets up and walks around.  Her echo shows a dilated cardiomyopathy with a markedly reduced EF of 25-30%.  Evidently had a catheterization 10 years ago through the femoral approach at [facility] that reportedly showed no significant disease.  Objective:  Vital Signs in the last 24 hours: BP 103/56 mmHg  Pulse 74  Temp(Src) 97.9 F (36.6 C) (Oral)  Resp 18  Ht 5\' 6"  (1.676 m)  Wt 88.542 kg (195 lb 3.2 oz)  BMI 31.52 kg/m2  SpO2 99%  Physical Exam: Pleasant obese female in no acute distress Lungs:  Clear  Cardiac:  Regular rhythm, normal S1 and S2, no S3 Extremities:  No edema present  Intake/Output from previous day: 02/13 0701 - 02/14 0700 In: 563 [P.O.:560; I.V.:3] Out: 1151 [Urine:1150; Stool:1] Weight Filed Weights   11/13/14 2326 11/14/14 0630 11/15/14 0549  Weight: 90.7 kg (199 lb 15.3 oz) 89.631 kg (197 lb 9.6 oz) 88.542 kg (195 lb 3.2 oz)    Lab Results: Basic Metabolic Panel:  Recent Labs  11/14/14 0720 11/15/14 0506  NA 140 139  K 3.8 4.1  CL 107 107  CO2 26 24  GLUCOSE 101* 94  BUN 12 15  CREATININE 0.98 0.94   CBC:  Recent Labs  11/13/14 1805 11/14/14 0720  WBC 7.0 5.0  NEUTROABS 4.5 3.0  HGB 11.6* 10.9*  HCT 35.5* 33.2*  MCV 92.7 91.2  PLT 235 241    BNP    Component Value Date/Time   BNP 655.6* 11/13/2014 1810   Cardiac Panel (last 3 results)  Recent Labs  11/14/14 0055 11/14/14 0720 11/14/14 1130  TROPONINI <0.03 <0.03 <0.03   Telemetry: Sinus rhythm  Assessment/Plan:  1.  Acute systolic congestive heart failure due to cardiomyopathy.  At this point the etiology of the cardiomyopathy is unclear.  She reportedly had normal coronary arteries 10 years ago but will need repeat catheterization to help evaluate this.  Following that can determine whether this is idiopathic, viral, ischemic, and then planned therapies from there.  One would wonder whether  the amphetamines that she has been on over the years could contribute to this and it may be worth tapering them.  2.  History of left bundle branch block 3.  Dyspnea and cough unclear whether this is cardiac or respiratory  Recommendations:  Plan cardiac catheterization tomorrow.  Cardiac catheterization was discussed with the patient fully including risks of myocardial infarction, death, stroke, bleeding, arrhythmia, dye allergy, renal insufficiency or bleeding.  The patient understands and is willing to proceed.   Go ahead and initiate ace inhibitors.      Kerry Hough  MD [facility]  11/15/2014, 11:14 AM

## 2014-11-16 DIAGNOSIS — I509 Heart failure, unspecified: Secondary | ICD-10-CM | POA: Insufficient documentation

## 2014-11-16 DIAGNOSIS — I517 Cardiomegaly: Secondary | ICD-10-CM | POA: Insufficient documentation

## 2014-11-16 DIAGNOSIS — R0602 Shortness of breath: Secondary | ICD-10-CM

## 2014-11-16 LAB — BASIC METABOLIC PANEL
ANION GAP: 10 (ref 5–15)
BUN: 17 mg/dL (ref 6–23)
CALCIUM: 9 mg/dL (ref 8.4–10.5)
CO2: 27 mmol/L (ref 19–32)
CREATININE: 0.99 mg/dL (ref 0.50–1.10)
Chloride: 103 mmol/L (ref 96–112)
GFR, EST AFRICAN AMERICAN: 72 mL/min — AB (ref >90)
GFR, EST NON AFRICAN AMERICAN: 62 mL/min — AB (ref >90)
Glucose, Bld: 94 mg/dL (ref 70–99)
Potassium: 3.7 mmol/L (ref 3.5–5.1)
SODIUM: 140 mmol/L (ref 135–145)

## 2014-11-16 LAB — LEGIONELLA ANTIGEN, URINE

## 2014-11-16 LAB — CULTURE, GROUP A STREP

## 2014-11-16 LAB — PROTIME-INR
INR: 1.05 (ref 0.00–1.49)
Prothrombin Time: 13.8 seconds (ref 11.6–15.2)

## 2014-11-16 MED ORDER — SODIUM CHLORIDE 0.9 % IJ SOLN
3.0000 mL | Freq: Two times a day (BID) | INTRAMUSCULAR | Status: DC
Start: 2014-11-16 — End: 2014-11-17
  Administered 2014-11-16 (×3): 3 mL via INTRAVENOUS

## 2014-11-16 MED ORDER — SODIUM CHLORIDE 0.9 % IJ SOLN: 3.0000 mL | INTRAMUSCULAR | Status: DC | PRN

## 2014-11-16 MED ORDER — SODIUM CHLORIDE 0.9 % IV SOLN: 250.0000 mL | INTRAVENOUS | Status: DC | PRN

## 2014-11-16 MED ORDER — SODIUM CHLORIDE 0.9 % IV SOLN: INTRAVENOUS | Status: DC

## 2014-11-16 MED ORDER — ASPIRIN 81 MG PO CHEW
81.0000 mg | CHEWABLE_TABLET | ORAL | Status: AC
  Administered 2014-11-17: 81 mg via ORAL
  Filled 2014-11-16: qty 1

## 2014-11-16 NOTE — Progress Notes (Signed)
Patient Name: Debra Holloway Date of Encounter: 11/16/2014     Principal Problem:   Acute systolic congestive heart failure Active Problems:   ADD (attention deficit disorder)   Hot flashes, menopausal   LBBB (left bundle branch block)   Dyslipidemia   Chest tightness   Idiopathic cardiomyopathy    SUBJECTIVE  Denies ever having any CP. SOB improved.   CURRENT MEDS . amphetamine-dextroamphetamine  10 mg Oral Q1200  . [START ON 11/17/2014] aspirin  81 mg Oral Pre-Cath  . aspirin EC  325 mg Oral Daily  . dextromethorphan-guaiFENesin  1 tablet Oral BID  . enoxaparin (LOVENOX) injection  40 mg Subcutaneous Q24H  . fluticasone  1 spray Each Nare Daily  . furosemide  40 mg Oral Daily  . lisdexamfetamine  40 mg Oral Daily  . lisinopril  5 mg Oral Daily  . loratadine  10 mg Oral Daily  . metoprolol succinate  12.5 mg Oral Daily  . montelukast  10 mg Oral QHS  . sodium chloride  3 mL Intravenous Q12H  . sodium chloride  3 mL Intravenous Q12H    OBJECTIVE  Filed Vitals:   11/15/14 1449 11/15/14 1536 11/15/14 2100 11/16/14 0702  BP: 93/57 102/62 90/48 85/49   Pulse: 77  69 61  Temp: 98.4 F (36.9 C)  97.6 F (36.4 C) 98.8 F (37.1 C)  TempSrc: Oral  Oral Oral  Resp: 16  18 17   Height:      Weight:    195 lb 15.8 oz (88.9 kg)  SpO2: 100%  98% 98%    Intake/Output Summary (Last 24 hours) at 11/16/14 0904 Last data filed at 11/16/14 0200  Gross per 24 hour  Intake    580 ml  Output    751 ml  Net   -171 ml   Filed Weights   11/14/14 0630 11/15/14 0549 11/16/14 0702  Weight: 197 lb 9.6 oz (89.631 kg) 195 lb 3.2 oz (88.542 kg) 195 lb 15.8 oz (88.9 kg)    PHYSICAL EXAM  General: Pleasant, NAD. Neuro: Alert and oriented X 3. Moves all extremities spontaneously. Psych: Normal affect. HEENT:  Normal  Neck: Supple without bruits or JVD. Lungs:  Resp regular and unlabored, CTA. Heart: RRR no s3, s4, or murmurs. Abdomen: Soft, non-tender, non-distended, BS + x 4.    Extremities: No clubbing, cyanosis or edema. DP/PT/Radials 2+ and equal bilaterally.  Accessory Clinical Findings  CBC  Recent Labs  11/13/14 1805 11/14/14 0720  WBC 7.0 5.0  NEUTROABS 4.5 3.0  HGB 11.6* 10.9*  HCT 35.5* 33.2*  MCV 92.7 91.2  PLT 235 809   Basic Metabolic Panel  Recent Labs  11/15/14 0506 11/16/14 0410  NA 139 140  K 4.1 3.7  CL 107 103  CO2 24 27  GLUCOSE 94 94  BUN 15 17  CREATININE 0.94 0.99  CALCIUM 9.1 9.0   Liver Function Tests  Recent Labs  11/14/14 0720  AST 30  ALT 31  ALKPHOS 57  BILITOT 1.2  PROT 6.5  ALBUMIN 3.7   Cardiac Enzymes  Recent Labs  11/14/14 0055 11/14/14 0720 11/14/14 1130  TROPONINI <0.03 <0.03 <0.03   BNP Invalid input(s): POCBNP D-Dimer  Recent Labs  11/13/14 1805  DDIMER 0.61*   Thyroid Function Tests  Recent Labs  11/13/14 2140  TSH 2.861    TELE Sinus brady with HR 50-60s, episode of sinus tach vs SVT    ECG  No new EKG  Echocardiogram 11/15/2014  -  Left ventricle: EF 15-20% high sphericity index no obvious mural apical thrombus. The cavity size was severely dilated. Wall thickness was normal. - Mitral valve: There was mild to moderate regurgitation. - Left atrium: The atrium was mildly dilated. - Right atrium: The atrium was mildly dilated. - Atrial septum: No defect or patent foramen ovale was identified. - Tricuspid valve: There was mild-moderate regurgitation. - Pulmonary arteries: PA peak pressure: 65 mm Hg (S).     Radiology/Studies  Dg Chest 2 View  11/13/2014   CLINICAL DATA:  Chest pain and pressure since this morning. Shortness of breath with ambulation. Cough last week. Low-grade fever.  EXAM: CHEST  2 VIEW  COMPARISON:  Rib series 4 11/21/2013  FINDINGS: Heart enlarged. There is prominence of interstitial markings. Kerley B-lines are present consistent with mild edema. There are no focal consolidations. No pleural effusions. Visualized osseous structures have  a normal appearance.  IMPRESSION: Cardiomegaly and mild interstitial edema.   Electronically Signed   By: Nolon Nations M.D.   On: 11/13/2014 19:11   Ct Angio Chest Pe W/cm &/or Wo Cm  11/13/2014   CLINICAL DATA:  Shortness of breath. Chest discomfort. Clinical suspicion for pulmonary embolism.  EXAM: CT ANGIOGRAPHY CHEST WITH CONTRAST  TECHNIQUE: Multidetector CT imaging of the chest was performed using the standard protocol during bolus administration of intravenous contrast. Multiplanar CT image reconstructions and MIPs were obtained to evaluate the vascular anatomy.  CONTRAST:  174mL OMNIPAQUE IOHEXOL 350 MG/ML SOLN  COMPARISON:  None.  FINDINGS: Mediastinum/Lymph Nodes: Satisfactory opacification of pulmonary arteries is noted, and no pulmonary embolism identified. Moderate cardiomegaly noted. No masses or pathologically enlarged lymph nodes identified.  Lungs/Pleura: Tiny pleural effusions present bilaterally. Diffuse thickening of pulmonary interstitial markings is seen which is most pronounced at the lung bases and most likely due to mild interstitial edema. No evidence of pulmonary consolidation or mass.  Musculoskeletal/Soft Tissues: No suspicious bone lesions or other significant chest wall abnormality.  Upper Abdomen:  Unremarkable.  Review of the MIP images confirms the above findings.  IMPRESSION: No evidence of pulmonary embolism.  Cardiomegaly, diffuse interstitial edema pattern, and tiny bilateral pleural effusions, consistent with mild congestive heart failure.   Electronically Signed   By: Earle Gell M.D.   On: 11/13/2014 20:39    ASSESSMENT AND PLAN  1. Acute heart failure: newly diagnosed  - reported previous cath 10 yrs ago at [facility] in [place] negative for CAD. Chronic LBBB  - recent URI, on Adderall for many years.   - futher workup with cath today to determine ICM vs NICM etiology  - continue BB and ACEI. Continue current dose of lasix. Unable to uptitrate due to hypotension.  May need to cut back lisinopril to 2.5mg  daily  - likely benefit from LifeVest, if no improvement in LV function on repeat echo in 3 month, may consider ICD  2. ADD 3. Chronic LBBB 4. SVT vs sinus tach: 2 episodes last night. Continue BB  Signed, Woodward Ku Pager: [phone]    The patient was seen, examined and discussed with Almyra Deforest, PA-C and I agree with the above.   A very pleasant 59 year old female with newly diagnosed acute systolic CHF, severely dilated LV, started on metoprolol and lisinopril, soft BP, will decrease lisinopril to 2.5 mg po daily. Continue the same dose of lasix, minimal fluid overload on exam. Awaiting left and right sided cath. Life vest oredred.  Dorothy Spark 11/16/2014

## 2014-11-16 NOTE — Progress Notes (Signed)
UR Completed.  [phone]  

## 2014-11-16 NOTE — Progress Notes (Signed)
1800 rersting comfortablty in bed

## 2014-11-16 NOTE — Progress Notes (Signed)
PATIENT DETAILS Name: Debra Holloway Age: 59 y.o. Sex: female Date of Birth: Feb 19, 1956 Admit Date: 11/13/2014 Admitting Physician Berle Mull, MD [id number], Sandria Manly, PA-C  Subjective: No major complaints  Assessment/Plan: Principal Problem:   Acute systolic CHF (congestive heart failure): Admitted with exertional dyspnea, significantly compensated after getting furosemide. Per cardiology note, 2-D echocardiogram demonstrates significantly reduced ejection fraction of around 15-20%.Plans are for cardiac catheterization on 2/15. Currently on lisinopril and Lasix-holding parameters placed as borderline hypotension this morning.  Active Problems:   Pulmonary hypertension: Probably secondary to severe systolic dysfunction. Defer further workup to cardiology     URI: Complains of some nasal and sinus congestion. Continue Flonase and loratadine and as needed Afrin. Influenza PCR and strep throat negative.    ADD/anxiety disorder: Stable, continue with Adderall and Xanax.   Disposition: Remain inpatient  Antibiotics:  None   Anti-infectives    None      DVT Prophylaxis: Prophylactic Lovenox   Code Status: Full code   Family Communication None  Procedures:  None  CONSULTS:  cardiology  MEDICATIONS: Scheduled Meds: . amphetamine-dextroamphetamine  10 mg Oral Q1200  . [START ON 11/17/2014] aspirin  81 mg Oral Pre-Cath  . aspirin EC  325 mg Oral Daily  . dextromethorphan-guaiFENesin  1 tablet Oral BID  . enoxaparin (LOVENOX) injection  40 mg Subcutaneous Q24H  . fluticasone  1 spray Each Nare Daily  . furosemide  40 mg Oral Daily  . lisdexamfetamine  40 mg Oral Daily  . lisinopril  5 mg Oral Daily  . loratadine  10 mg Oral Daily  . metoprolol succinate  12.5 mg Oral Daily  . montelukast  10 mg Oral QHS  . sodium chloride  3 mL Intravenous Q12H  . sodium chloride  3 mL Intravenous Q12H   Continuous Infusions: . [START ON 11/17/2014] sodium  chloride     PRN Meds:.sodium chloride, sodium chloride, acetaminophen, albuterol, ALPRAZolam, menthol-cetylpyridinium, ondansetron (ZOFRAN) IV, oxymetazoline, sodium chloride, sodium chloride    PHYSICAL EXAM: Vital signs in last 24 hours: Filed Vitals:   11/15/14 1536 11/15/14 2100 11/16/14 0702 11/16/14 1003  BP: 102/62 90/48 85/49  93/52  Pulse:  69 61 65  Temp:  97.6 F (36.4 C) 98.8 F (37.1 C)   TempSrc:  Oral Oral   Resp:  18 17   Height:      Weight:   88.9 kg (195 lb 15.8 oz)   SpO2:  98% 98%     Weight change:  Filed Weights   11/14/14 0630 11/15/14 0549 11/16/14 0702  Weight: 89.631 kg (197 lb 9.6 oz) 88.542 kg (195 lb 3.2 oz) 88.9 kg (195 lb 15.8 oz)   Body mass index is 31.65 kg/(m^2).   Gen Exam: Awake and alert with clear speech. Neck: Supple, No JVD.   Chest: B/L Clear.  No rales or rhonchi CVS: S1 S2 Regular, no murmurs.  Abdomen: soft, BS +, non tender, non distended.  Extremities: no edema, lower extremities warm to touch. Neurologic: Non Focal.   Skin: No Rash.   Wounds: N/A.   Intake/Output from previous day:  Intake/Output Summary (Last 24 hours) at 11/16/14 1107 Last data filed at 11/16/14 0900  Gross per 24 hour  Intake    220 ml  Output    451 ml  Net   -231 ml     LAB RESULTS: CBC  Recent Labs Lab 11/13/14 1805 11/14/14 0720  WBC 7.0 5.0  HGB 11.6*  10.9*  HCT 35.5* 33.2*  PLT 235 241  MCV 92.7 91.2  MCH 30.3 29.9  MCHC 32.7 32.8  RDW 13.0 13.4  LYMPHSABS 1.9 1.5  MONOABS 0.4 0.4  EOSABS 0.1 0.1  BASOSABS 0.1 0.1    Chemistries   Recent Labs Lab 11/13/14 1805 11/14/14 0720 11/15/14 0506 11/16/14 0410  NA 137 140 139 140  K 3.9 3.8 4.1 3.7  CL 112 107 107 103  CO2 21 26 24 27   GLUCOSE 101* 101* 94 94  BUN 19 12 15 17   CREATININE 0.93 0.98 0.94 0.99  CALCIUM 8.7 8.8 9.1 9.0    CBG: No results for input(s): GLUCAP in the last 168 hours.  GFR Estimated Creatinine Clearance: 70.4 mL/min (by C-G formula  based on Cr of 0.99).  Coagulation profile  Recent Labs Lab 11/16/14 0410  INR 1.05    Cardiac Enzymes  Recent Labs Lab 11/14/14 0055 11/14/14 0720 11/14/14 1130  TROPONINI <0.03 <0.03 <0.03    Invalid input(s): POCBNP  Recent Labs  11/13/14 1805  DDIMER 0.61*   No results for input(s): HGBA1C in the last 72 hours. No results for input(s): CHOL, HDL, LDLCALC, TRIG, CHOLHDL, LDLDIRECT in the last 72 hours.  Recent Labs  11/13/14 2140  TSH 2.861   No results for input(s): VITAMINB12, FOLATE, FERRITIN, TIBC, IRON, RETICCTPCT in the last 72 hours. No results for input(s): LIPASE, AMYLASE in the last 72 hours.  Urine Studies No results for input(s): UHGB, CRYS in the last 72 hours.  Invalid input(s): UACOL, UAPR, USPG, UPH, UTP, UGL, UKET, UBIL, UNIT, UROB, ULEU, UEPI, UWBC, URBC, UBAC, CAST, UCOM, BILUA  MICROBIOLOGY: Recent Results (from the past 240 hour(s))  Rapid strep screen     Status: None   Collection Time: 11/14/14  2:28 AM  Result Value Ref Range Status   Streptococcus, Group A Screen (Direct) NEGATIVE NEGATIVE Final    Comment: (NOTE) A Rapid Antigen test may result negative if the antigen level in the sample is below the detection level of this test. The FDA has not cleared this test as a stand-alone test therefore the rapid antigen negative result has reflexed to a Group A Strep culture.   Culture, Group A Strep     Status: None (Preliminary result)   Collection Time: 11/14/14  2:28 AM  Result Value Ref Range Status   Specimen Description THROAT  Final   Special Requests NONE  Final   Culture   Final    NO SUSPICIOUS COLONIES, CONTINUING TO HOLD Performed at [facility]    Report Status PENDING  Incomplete    RADIOLOGY STUDIES/RESULTS: Dg Chest 2 View  11/13/2014   CLINICAL DATA:  Chest pain and pressure since this morning. Shortness of breath with ambulation. Cough last week. Low-grade fever.  EXAM: CHEST  2 VIEW  COMPARISON:   Rib series 4 11/21/2013  FINDINGS: Heart enlarged. There is prominence of interstitial markings. Kerley B-lines are present consistent with mild edema. There are no focal consolidations. No pleural effusions. Visualized osseous structures have a normal appearance.  IMPRESSION: Cardiomegaly and mild interstitial edema.   Electronically Signed   By: Nolon Nations M.D.   On: 11/13/2014 19:11   Ct Angio Chest Pe W/cm &/or Wo Cm  11/13/2014   CLINICAL DATA:  Shortness of breath. Chest discomfort. Clinical suspicion for pulmonary embolism.  EXAM: CT ANGIOGRAPHY CHEST WITH CONTRAST  TECHNIQUE: Multidetector CT imaging of the chest was performed using the standard protocol during bolus  administration of intravenous contrast. Multiplanar CT image reconstructions and MIPs were obtained to evaluate the vascular anatomy.  CONTRAST:  119mL OMNIPAQUE IOHEXOL 350 MG/ML SOLN  COMPARISON:  None.  FINDINGS: Mediastinum/Lymph Nodes: Satisfactory opacification of pulmonary arteries is noted, and no pulmonary embolism identified. Moderate cardiomegaly noted. No masses or pathologically enlarged lymph nodes identified.  Lungs/Pleura: Tiny pleural effusions present bilaterally. Diffuse thickening of pulmonary interstitial markings is seen which is most pronounced at the lung bases and most likely due to mild interstitial edema. No evidence of pulmonary consolidation or mass.  Musculoskeletal/Soft Tissues: No suspicious bone lesions or other significant chest wall abnormality.  Upper Abdomen:  Unremarkable.  Review of the MIP images confirms the above findings.  IMPRESSION: No evidence of pulmonary embolism.  Cardiomegaly, diffuse interstitial edema pattern, and tiny bilateral pleural effusions, consistent with mild congestive heart failure.   Electronically Signed   By: Earle Gell M.D.   On: 11/13/2014 20:39    Oren Binet, MD  [facility] Pager:[phone]  If 7PM-7AM, please contact  night-coverage [email or web address] Password [email or web address] 11/16/2014, 11:07 AM   LOS: 3 days

## 2014-11-16 NOTE — Care Management Note (Signed)
    Page 1 of 1   11/18/2014     2:54:36 PM CARE MANAGEMENT NOTE 11/18/2014  Patient:  Debra Holloway, Debra Holloway   Account Number:  [id number]  Date Initiated:  11/16/2014  Documentation initiated by:  Aviya Jarvie  Subjective/Objective Assessment:   Pt adm on 11/13/14 with acute CHF.  PTA, pt independent of ADLS.     Action/Plan:   Will follow for dc needs at pt progresses.  Pt will need Lifevest prior to dc.  Faxed clinical info to [facility] at [phone], per request.   Anticipated DC Date:  11/17/2014   Anticipated DC Plan:  [place]  CM consult      Choice offered to / List presented to:     DME arranged  OTHER - SEE COMMENT      DME agency  OTHER - SEE NOTE        Status of service:  Completed, signed off Medicare Important Message given?  NO (If response is "NO", the following Medicare IM given date fields will be blank) Date Medicare IM given:   Medicare IM given by:   Date Additional Medicare IM given:   Additional Medicare IM given by:    Discharge Disposition:    Per UR Regulation:  Reviewed for med. necessity/level of care/duration of stay  If discussed at [place] of Stay Meetings, dates discussed:    Comments:  11/18/14 Ellan Lambert, RN, BSN [phone] Pt fitted with Lifevest last evening...plan dc home later today.  11/17/14 Ellan Lambert, RN, BSN [phone] Lifevest to be fitted this evening around 6:45pm, per [employer] rep Kerrin Mo.

## 2014-11-16 NOTE — Progress Notes (Signed)
Heart Failure Navigator Consult Note  Presentation: Debra Holloway is a 59 y.o. female with Past medical history of ADD, anxiety disorder, left bundle branch block with normal cath, depression. The patient presented with complaints of shortness of breath. She mentions since last one week she has been having upper respiratory symptoms. She initially started with headache with sinusitis later on had a runny nose and cough with changes in her voice. Later on the chest started hurting in the lower chest when she was trying to take a deep breath along with some cough. She also started having complains of orthopnea and has been sleeping with a problem of bed. She started having PND over last 2 days. Yesterday when she woke up she was having significantly hard time walking around with shortness of breath and doing her regular activity. And therefore she went to the ER. She has also noted some swelling of her legs. Denies any nausea or vomiting denies any sick contact denies any travel outside of the state's. She mentions she had history of panic attacks and was seen at [facility] where she was found to have left bundle branch block with chest tightness and had undergone a cath which was normal. She is not taking any aspirin on a daily basis.  Past Medical History  Diagnosis Date  . ADD (attention deficit disorder)   . Environmental allergies   . Chicken pox   . H/O vitamin D deficiency   . Seasonal affective disorder   . LBBB (left bundle branch block)     W/1st degree AV Block, Avoid Beta Blockers  . AR (allergic rhinitis)   . Depression   . Dyslipidemia     History   Social History  . Marital Status: Legally Separated    Spouse Name: N/A  . Number of Children: N/A  . Years of Education: N/A   Social History Main Topics  . Smoking status: Never Smoker   . Smokeless tobacco: Never Used  . Alcohol Use: 2.4 oz/week    4 Glasses of wine per week  . Drug Use: No  . Sexual Activity: Not  on file   Other Topics Concern  . None   Social History Narrative    ECHO:Study Conclusions  - Left ventricle: EF 15-20% high sphericity index no obvious mural apical thrombus. The cavity size was severely dilated. Wall thickness was normal. - Mitral valve: There was mild to moderate regurgitation. - Left atrium: The atrium was mildly dilated. - Right atrium: The atrium was mildly dilated. - Atrial septum: No defect or patent foramen ovale was identified. - Tricuspid valve: There was mild-moderate regurgitation. - Pulmonary arteries: PA peak pressure: 65 mm Hg (S).  Transthoracic echocardiography. M-mode, complete 2D, spectral Doppler, and color Doppler. Birthdate: Patient birthdate: [birth year]. Age: Patient is 59 yr old. Sex: Gender: female. BMI: 31.5 kg/m^2. Blood pressure:   103/56 Patient status: Inpatient. Study date: Study date: 11/15/2014. Study time: 09:23 AM. Location: Bedside.  BNP    Component Value Date/Time   BNP 655.6* 11/13/2014 1810    ProBNP No results found for: PROBNP   Education Assessment and Provision:  Detailed education and instructions provided on heart failure disease management including the following:  Signs and symptoms of Heart Failure When to call the physician Importance of daily weights Low sodium diet Fluid restriction Medication management Anticipated future follow-up appointments  Patient education given on each of the above topics.  Patient acknowledges understanding and acceptance of all instructions.  I spoke for a  while with Debra Holloway regarding her new diagnosis of HF.  She seems to have a good basic understanding of the above topics.  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  She did have some concerns about decreased BP and how it relates to the "HF meds".  She has a cardiac catheterization planned for today and I will see her after that to reinforce education.   Education Materials:  "Living Better With  Heart Failure" Booklet, Daily Weight Tracker Tool   High Risk Criteria for Readmission and/or Poor Patient Outcomes:  (Recommend Follow-up with Advanced Heart Failure Clinic)--Yes-she would benefit from outpatient follow-up in the AHF clinic for transition and to continue education.   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

## 2014-11-17 ENCOUNTER — Encounter (HOSPITAL_COMMUNITY): Payer: Self-pay | Admitting: Cardiovascular Disease

## 2014-11-17 ENCOUNTER — Encounter (HOSPITAL_COMMUNITY)
Admission: EM | Disposition: A | Discharge status: Home / Self Care | Payer: Self-pay | Source: Home / Self Care | Attending: Internal Medicine

## 2014-11-17 DIAGNOSIS — I428 Other cardiomyopathies: Secondary | ICD-10-CM | POA: Insufficient documentation

## 2014-11-17 DIAGNOSIS — I517 Cardiomegaly: Secondary | ICD-10-CM

## 2014-11-17 DIAGNOSIS — I429 Cardiomyopathy, unspecified: Secondary | ICD-10-CM

## 2014-11-17 DIAGNOSIS — I5021 Acute systolic (congestive) heart failure: Principal | ICD-10-CM

## 2014-11-17 DIAGNOSIS — I420 Dilated cardiomyopathy: Secondary | ICD-10-CM | POA: Insufficient documentation

## 2014-11-17 HISTORY — PX: LEFT AND RIGHT HEART CATHETERIZATION WITH CORONARY ANGIOGRAM: SHX5449

## 2014-11-17 LAB — BASIC METABOLIC PANEL
Anion gap: 13 (ref 5–15)
BUN: 21 mg/dL (ref 6–23)
CALCIUM: 9.2 mg/dL (ref 8.4–10.5)
CHLORIDE: 106 mmol/L (ref 96–112)
CO2: 21 mmol/L (ref 19–32)
Creatinine, Ser: 0.99 mg/dL (ref 0.50–1.10)
GFR, EST AFRICAN AMERICAN: 72 mL/min — AB (ref >90)
GFR, EST NON AFRICAN AMERICAN: 62 mL/min — AB (ref >90)
Glucose, Bld: 92 mg/dL (ref 70–99)
POTASSIUM: 3.6 mmol/L (ref 3.5–5.1)
Sodium: 140 mmol/L (ref 135–145)

## 2014-11-17 LAB — POCT I-STAT 3, VENOUS BLOOD GAS (G3P V)
ACID-BASE DEFICIT: 1 mmol/L (ref 0.0–2.0)
Bicarbonate: 25 mEq/L — ABNORMAL HIGH (ref 20.0–24.0)
O2 SAT: 56 %
TCO2: 26 mmol/L (ref 0–100)
pCO2, Ven: 45.3 mmHg (ref 45.0–50.0)
pH, Ven: 7.35 — ABNORMAL HIGH (ref 7.250–7.300)
pO2, Ven: 31 mmHg (ref 30.0–45.0)

## 2014-11-17 LAB — POCT I-STAT 3, ART BLOOD GAS (G3+)
Acid-base deficit: 2 mmol/L (ref 0.0–2.0)
Bicarbonate: 23.2 mEq/L (ref 20.0–24.0)
O2 Saturation: 93 %
PCO2 ART: 38.6 mmHg (ref 35.0–45.0)
PH ART: 7.387 (ref 7.350–7.450)
TCO2: 24 mmol/L (ref 0–100)
pO2, Arterial: 67 mmHg — ABNORMAL LOW (ref 80.0–100.0)

## 2014-11-17 SURGERY — LEFT AND RIGHT HEART CATHETERIZATION WITH CORONARY ANGIOGRAM
Anesthesia: LOCAL

## 2014-11-17 MED ORDER — ACETAMINOPHEN 325 MG PO TABS: 650.0000 mg | ORAL_TABLET | ORAL | Status: DC | PRN

## 2014-11-17 MED ORDER — MIDAZOLAM HCL 2 MG/2ML IJ SOLN
INTRAMUSCULAR | Status: AC
  Filled 2014-11-17: qty 2

## 2014-11-17 MED ORDER — METOPROLOL SUCCINATE ER 25 MG PO TB24: 12.5000 mg | ORAL_TABLET | Freq: Every day | ORAL | Status: DC

## 2014-11-17 MED ORDER — FUROSEMIDE 40 MG PO TABS: 40.0000 mg | ORAL_TABLET | Freq: Every day | ORAL | Status: DC

## 2014-11-17 MED ORDER — LISINOPRIL 5 MG PO TABS: 5.0000 mg | ORAL_TABLET | Freq: Every day | ORAL | Status: DC

## 2014-11-17 MED ORDER — HEPARIN SODIUM (PORCINE) 5000 UNIT/ML IJ SOLN
5000.0000 [IU] | Freq: Three times a day (TID) | INTRAMUSCULAR | Status: DC
  Administered 2014-11-18: 5000 [IU] via SUBCUTANEOUS
  Filled 2014-11-17 (×6): qty 1

## 2014-11-17 MED ORDER — FENTANYL CITRATE 0.05 MG/ML IJ SOLN
INTRAMUSCULAR | Status: AC
  Filled 2014-11-17: qty 2

## 2014-11-17 MED ORDER — ASPIRIN EC 81 MG PO TBEC
81.0000 mg | DELAYED_RELEASE_TABLET | Freq: Every day | ORAL | Status: DC
  Administered 2014-11-18 – 2014-11-19 (×2): 81 mg via ORAL
  Filled 2014-11-17 (×3): qty 1

## 2014-11-17 MED ORDER — LIDOCAINE HCL (PF) 1 % IJ SOLN
INTRAMUSCULAR | Status: AC
  Filled 2014-11-17: qty 30

## 2014-11-17 MED ORDER — SODIUM CHLORIDE 0.9 % IV SOLN
INTRAVENOUS | Status: DC
  Administered 2014-11-17: 22:00:00 via INTRAVENOUS

## 2014-11-17 MED ORDER — HEPARIN (PORCINE) IN NACL 2-0.9 UNIT/ML-% IJ SOLN
INTRAMUSCULAR | Status: AC
  Filled 2014-11-17: qty 1000

## 2014-11-17 MED ORDER — ONDANSETRON HCL 4 MG/2ML IJ SOLN: 4.0000 mg | Freq: Four times a day (QID) | INTRAMUSCULAR | Status: DC | PRN

## 2014-11-17 NOTE — Progress Notes (Signed)
Site area: RFA Site Prior to Removal:  Level 0 Pressure Applied For:67min Manual:   yes Patient Status During Pull:  stable Post Pull Site:  Level Post Pull Instructions Given:   Post Pull Pulses Present: palpable Dressing Applied:  clear Bedrest begins @ 1300 Comments:

## 2014-11-17 NOTE — Progress Notes (Signed)
PATIENT DETAILS Name: Debra Holloway Age: 59 y.o. Sex: female Date of Birth: 1956-05-12 Admit Date: 11/13/2014 Admitting Physician Berle Mull, MD [id number], Sandria Manly, PA-C  Brief narrative: 59 year old Caucasian female without any significant medical problems admitted with exertional dyspnea. 2-D echocardiogram demonstrated severely reduced ejection fraction and pulmonary hypertension. Cardiology consulted, awaiting cardiac catheterization.   Subjective: No major complaints  Assessment/Plan: Principal Problem:   Acute systolic CHF (congestive heart failure): Admitted with exertional dyspnea, significantly compensated after getting furosemide.  2-D echocardiogram demonstrates significantly reduced ejection fraction of around 15-20%.Plans are for right and left cardiac catheterization on 2/16. Currently low doses of lisinopril, metoprolol and Lasix. Per cardiology will need to go home on life vest.   Active Problems:   Pulmonary hypertension: Probably secondary to severe systolic dysfunction. Defer further workup to cardiology     URI: Complains of some nasal and sinus congestion. Continue Flonase and loratadine and as needed Afrin. Influenza PCR and strep throat negative.    ADD/anxiety disorder: Stable, continue with Adderall and Xanax.   Disposition: Remain inpatient-home in the next 1 day once workup complete.  Antibiotics:  None   Anti-infectives    None      DVT Prophylaxis: SCDs.  Code Status: Full code   Family Communication None  Procedures:  None  CONSULTS:  cardiology  MEDICATIONS: Scheduled Meds: . amphetamine-dextroamphetamine  10 mg Oral Q1200  . aspirin EC  325 mg Oral Daily  . dextromethorphan-guaiFENesin  1 tablet Oral BID  . fluticasone  1 spray Each Nare Daily  . furosemide  40 mg Oral Daily  . lisdexamfetamine  40 mg Oral Daily  . lisinopril  5 mg Oral Daily  . loratadine  10 mg Oral Daily  . metoprolol succinate  12.5  mg Oral Daily  . montelukast  10 mg Oral QHS  . sodium chloride  3 mL Intravenous Q12H  . sodium chloride  3 mL Intravenous Q12H   Continuous Infusions: . sodium chloride     PRN Meds:.sodium chloride, sodium chloride, acetaminophen, albuterol, ALPRAZolam, menthol-cetylpyridinium, ondansetron (ZOFRAN) IV, oxymetazoline, sodium chloride, sodium chloride    PHYSICAL EXAM: Vital signs in last 24 hours: Filed Vitals:   11/16/14 2042 11/17/14 0540 11/17/14 1004 11/17/14 1005  BP: 96/51 100/64 95/59 95/59   Pulse: 69 62 70   Temp: 98.1 F (36.7 C) 97.9 F (36.6 C)    TempSrc: Oral Oral    Resp: 18 20    Height:      Weight:  88.4 kg (194 lb 14.2 oz)    SpO2: 99% 97%      Weight change:  Filed Weights   11/15/14 0549 11/16/14 0702 11/17/14 0540  Weight: 88.542 kg (195 lb 3.2 oz) 88.9 kg (195 lb 15.8 oz) 88.4 kg (194 lb 14.2 oz)   Body mass index is 31.47 kg/(m^2).   Gen Exam: Awake and alert with clear speech. Not in any acute distress Neck: Supple, No JVD.   Chest: B/L Clear.  No rales or rhonchi CVS: S1 S2 Regular, no murmurs.  Abdomen: soft, BS +, non tender, non distended.  Extremities: no edema, lower extremities warm to touch. Neurologic: Non Focal.   Skin: No Rash.   Wounds: N/A.   Intake/Output from previous day:  Intake/Output Summary (Last 24 hours) at 11/17/14 1030 Last data filed at 11/17/14 0600  Gross per 24 hour  Intake    360 ml  Output   1725 ml  Net  -  1365 ml     LAB RESULTS: CBC  Recent Labs Lab 11/13/14 1805 11/14/14 0720  WBC 7.0 5.0  HGB 11.6* 10.9*  HCT 35.5* 33.2*  PLT 235 241  MCV 92.7 91.2  MCH 30.3 29.9  MCHC 32.7 32.8  RDW 13.0 13.4  LYMPHSABS 1.9 1.5  MONOABS 0.4 0.4  EOSABS 0.1 0.1  BASOSABS 0.1 0.1    Chemistries   Recent Labs Lab 11/13/14 1805 11/14/14 0720 11/15/14 0506 11/16/14 0410 11/17/14 0256  NA 137 140 139 140 140  K 3.9 3.8 4.1 3.7 3.6  CL 112 107 107 103 106  CO2 21 26 24 27 21   GLUCOSE 101*  101* 94 94 92  BUN 19 12 15 17 21   CREATININE 0.93 0.98 0.94 0.99 0.99  CALCIUM 8.7 8.8 9.1 9.0 9.2    CBG: No results for input(s): GLUCAP in the last 168 hours.  GFR Estimated Creatinine Clearance: 70.2 mL/min (by C-G formula based on Cr of 0.99).  Coagulation profile  Recent Labs Lab 11/16/14 0410  INR 1.05    Cardiac Enzymes  Recent Labs Lab 11/14/14 0055 11/14/14 0720 11/14/14 1130  TROPONINI <0.03 <0.03 <0.03    Invalid input(s): POCBNP No results for input(s): DDIMER in the last 72 hours. No results for input(s): HGBA1C in the last 72 hours. No results for input(s): CHOL, HDL, LDLCALC, TRIG, CHOLHDL, LDLDIRECT in the last 72 hours. No results for input(s): TSH, T4TOTAL, T3FREE, THYROIDAB in the last 72 hours.  Invalid input(s): FREET3 No results for input(s): VITAMINB12, FOLATE, FERRITIN, TIBC, IRON, RETICCTPCT in the last 72 hours. No results for input(s): LIPASE, AMYLASE in the last 72 hours.  Urine Studies No results for input(s): UHGB, CRYS in the last 72 hours.  Invalid input(s): UACOL, UAPR, USPG, UPH, UTP, UGL, UKET, UBIL, UNIT, UROB, ULEU, UEPI, UWBC, URBC, UBAC, CAST, UCOM, BILUA  MICROBIOLOGY: Recent Results (from the past 240 hour(s))  Rapid strep screen     Status: None   Collection Time: 11/14/14  2:28 AM  Result Value Ref Range Status   Streptococcus, Group A Screen (Direct) NEGATIVE NEGATIVE Final    Comment: (NOTE) A Rapid Antigen test may result negative if the antigen level in the sample is below the detection level of this test. The FDA has not cleared this test as a stand-alone test therefore the rapid antigen negative result has reflexed to a Group A Strep culture.   Culture, Group A Strep     Status: None   Collection Time: 11/14/14  2:28 AM  Result Value Ref Range Status   Specimen Description THROAT  Final   Special Requests NONE  Final   Culture   Final    No Beta Hemolytic Streptococci Isolated Performed at [facility]    Report Status 11/16/2014 FINAL  Final    RADIOLOGY STUDIES/RESULTS: Dg Chest 2 View  11/13/2014   CLINICAL DATA:  Chest pain and pressure since this morning. Shortness of breath with ambulation. Cough last week. Low-grade fever.  EXAM: CHEST  2 VIEW  COMPARISON:  Rib series 4 11/21/2013  FINDINGS: Heart enlarged. There is prominence of interstitial markings. Kerley B-lines are present consistent with mild edema. There are no focal consolidations. No pleural effusions. Visualized osseous structures have a normal appearance.  IMPRESSION: Cardiomegaly and mild interstitial edema.   Electronically Signed   By: Nolon Nations M.D.   On: 11/13/2014 19:11   Ct Angio Chest Pe W/cm &/or Wo Cm  11/13/2014  CLINICAL DATA:  Shortness of breath. Chest discomfort. Clinical suspicion for pulmonary embolism.  EXAM: CT ANGIOGRAPHY CHEST WITH CONTRAST  TECHNIQUE: Multidetector CT imaging of the chest was performed using the standard protocol during bolus administration of intravenous contrast. Multiplanar CT image reconstructions and MIPs were obtained to evaluate the vascular anatomy.  CONTRAST:  131mL OMNIPAQUE IOHEXOL 350 MG/ML SOLN  COMPARISON:  None.  FINDINGS: Mediastinum/Lymph Nodes: Satisfactory opacification of pulmonary arteries is noted, and no pulmonary embolism identified. Moderate cardiomegaly noted. No masses or pathologically enlarged lymph nodes identified.  Lungs/Pleura: Tiny pleural effusions present bilaterally. Diffuse thickening of pulmonary interstitial markings is seen which is most pronounced at the lung bases and most likely due to mild interstitial edema. No evidence of pulmonary consolidation or mass.  Musculoskeletal/Soft Tissues: No suspicious bone lesions or other significant chest wall abnormality.  Upper Abdomen:  Unremarkable.  Review of the MIP images confirms the above findings.  IMPRESSION: No evidence of pulmonary embolism.  Cardiomegaly, diffuse interstitial edema  pattern, and tiny bilateral pleural effusions, consistent with mild congestive heart failure.   Electronically Signed   By: Earle Gell M.D.   On: 11/13/2014 20:39    Oren Binet, MD  [facility] Pager:[phone]  If 7PM-7AM, please contact night-coverage [email or web address] Password [email or web address] 11/17/2014, 10:30 AM   LOS: 4 days

## 2014-11-17 NOTE — Interval H&P Note (Signed)
Cath Lab Visit (complete for each Cath Lab visit)  Clinical Evaluation Leading to the Procedure:   ACS: No.  Non-ACS:    Anginal Classification: CCS III  Anti-ischemic medical therapy: Maximal Therapy (2 or more classes of medications)  Non-Invasive Test Results: No non-invasive testing performed  Prior CABG: No previous CABG      History and Physical Interval Note:  11/17/2014 11:07 AM  Consuello Bossier  has presented today for surgery, with the diagnosis of heart failure  The various methods of treatment have been discussed with the patient and family. After consideration of risks, benefits and other options for treatment, the patient has consented to  Procedure(s): LEFT AND RIGHT HEART CATHETERIZATION WITH CORONARY ANGIOGRAM (N/A) as a surgical intervention .  The patient's history has been reviewed, patient examined, no change in status, stable for surgery.  I have reviewed the patient's chart and labs.  Questions were answered to the patient's satisfaction.     KELLY,THOMAS A

## 2014-11-17 NOTE — CV Procedure (Signed)
  Debra Holloway is a 59 y.o. female   [id number]  [id number] LOCATION:  FACILITY: [place]  PHYSICIAN: Troy Sine, MD, [facility] [birth year]   DATE OF PROCEDURE:  11/17/2014      RIGHT AND LEFT HEART CARDIAC CATHETERIZATION   HISTORY:  Debra Holloway is a 59 y.o. female  Reportedly had a history of normal coronary arteries by catheterization over 10 years ago.  She has recently developed increasing symptoms of shortness of breath /CHF.  An echo has suggested an ejection fraction of 15%.  She is now referred for definitive right and left heart cardiac catheterization.   PROCEDURE:   Right and left heart catheterization: Swan-Ganz catheterization , cardiac output determination by the thermodilution and Fick methods, coronary angiography, left ventriculography.  The patient was brought to the second floor Na[facility] Cardiac cath lab in the postabsorptive state. Versed 2 mg and fentanyl 50 mcg were administered for conscious sedation. The right groin was prepped and draped in sterile fashion and a 5 [place] arterial sheath and 7 French venous sheath were inserted without difficulty. A Swan-Ganz catheter was advanced into the venous sheath and pressures were obtained in the right atrium, right ventricle, pulmonary artery, and pulmonary capillary wedge position. Cardiac outputs were obtained by the thermodilution and assumed Fick methods. Oxygen saturation was obtained in the pulmonary artery and aorta. A pigtail catheter was inserted and simultaneous AO/PA pressures were recorded. The pigtail catheter was advanced into the left ventricle and simultaneous left ventricular and PCW pressures were recorded. Left ventriculography was performed in the RAO projection.  A left ventricle to aorta pullback was performed. The pigtail catheter was then removed and diagnostic catheterization to delineate the coronary anatomy was performed utilizing 5 French Judkins 4 left and right diagnostic catheters. All catheters were  removed and the patient. Hemostasis was obtained by direct manual pressure. The patient tolerated the procedure well and returned to his room in satisfactory condition.   HEMODYNAMICS:   RA: a 8  V 4; Mean 5 RV: 45/6 PA: 45/15 PC: 22 AO: 117/62  LV: 110/21  PC: 22  LV: 110/20 AO: 110/73   Oxygen saturation in the aorta 93% and the pulmonary artery 56%  Cardiac output: 5.2 l/min (Thermo); 4.8 (Fick)  Cardiac index: 2.6 l/m/m2                2.4  ANGIOGRAPHY:   Left main:  Angiographically normal short vessel which bifurcated into the LAD and left circumflex coronary arteries.  LAD:  Angiographically normal vessel which gave rise to several septal perforating arteries and one major diagonal vessel which extended to the apex.  Left circumflex:  Angiographically normal vessel which gave rise to one marginal branch.   Right coronary artery:  Angiographically normal, dominant vessel which gave rise to a moderate size PDA and PLA vessel.  Left ventriculography  revealed a severely dilated left ventricle  With severe diffuse global hypokinesis and an ejection fraction less than or equal to 15%.   IMPRESSION:  Nonischemic cardiomyopathy with a severely dilated left ventricle with an ejection fraction of equal to or less than 15%.  Normal coronary arteries.  RECOMMENDATION:  Aggressive medical therapy with hope for improvement in LV function. Patient may require a temporary LifeVest prior to discharge and if LV function does not improved with  Therapy will need prophylactic ICD implantation.    Troy Sine, MD, [facility] 11/17/2014 12:12 PM

## 2014-11-17 NOTE — H&P (View-Only) (Signed)
Patient Name: Debra Holloway Date of Encounter: 11/16/2014     Principal Problem:   Acute systolic congestive heart failure Active Problems:   ADD (attention deficit disorder)   Hot flashes, menopausal   LBBB (left bundle branch block)   Dyslipidemia   Chest tightness   Idiopathic cardiomyopathy    SUBJECTIVE  Denies ever having any CP. SOB improved.   CURRENT MEDS . amphetamine-dextroamphetamine  10 mg Oral Q1200  . [START ON 11/17/2014] aspirin  81 mg Oral Pre-Cath  . aspirin EC  325 mg Oral Daily  . dextromethorphan-guaiFENesin  1 tablet Oral BID  . enoxaparin (LOVENOX) injection  40 mg Subcutaneous Q24H  . fluticasone  1 spray Each Nare Daily  . furosemide  40 mg Oral Daily  . lisdexamfetamine  40 mg Oral Daily  . lisinopril  5 mg Oral Daily  . loratadine  10 mg Oral Daily  . metoprolol succinate  12.5 mg Oral Daily  . montelukast  10 mg Oral QHS  . sodium chloride  3 mL Intravenous Q12H  . sodium chloride  3 mL Intravenous Q12H    OBJECTIVE  Filed Vitals:   11/15/14 1449 11/15/14 1536 11/15/14 2100 11/16/14 0702  BP: 93/57 102/62 90/48 85/49   Pulse: 77  69 61  Temp: 98.4 F (36.9 C)  97.6 F (36.4 C) 98.8 F (37.1 C)  TempSrc: Oral  Oral Oral  Resp: 16  18 17   Height:      Weight:    195 lb 15.8 oz (88.9 kg)  SpO2: 100%  98% 98%    Intake/Output Summary (Last 24 hours) at 11/16/14 0904 Last data filed at 11/16/14 0200  Gross per 24 hour  Intake    580 ml  Output    751 ml  Net   -171 ml   Filed Weights   11/14/14 0630 11/15/14 0549 11/16/14 0702  Weight: 197 lb 9.6 oz (89.631 kg) 195 lb 3.2 oz (88.542 kg) 195 lb 15.8 oz (88.9 kg)    PHYSICAL EXAM  General: Pleasant, NAD. Neuro: Alert and oriented X 3. Moves all extremities spontaneously. Psych: Normal affect. HEENT:  Normal  Neck: Supple without bruits or JVD. Lungs:  Resp regular and unlabored, CTA. Heart: RRR no s3, s4, or murmurs. Abdomen: Soft, non-tender, non-distended, BS + x 4.    Extremities: No clubbing, cyanosis or edema. DP/PT/Radials 2+ and equal bilaterally.  Accessory Clinical Findings  CBC  Recent Labs  11/13/14 1805 11/14/14 0720  WBC 7.0 5.0  NEUTROABS 4.5 3.0  HGB 11.6* 10.9*  HCT 35.5* 33.2*  MCV 92.7 91.2  PLT 235 295   Basic Metabolic Panel  Recent Labs  11/15/14 0506 11/16/14 0410  NA 139 140  K 4.1 3.7  CL 107 103  CO2 24 27  GLUCOSE 94 94  BUN 15 17  CREATININE 0.94 0.99  CALCIUM 9.1 9.0   Liver Function Tests  Recent Labs  11/14/14 0720  AST 30  ALT 31  ALKPHOS 57  BILITOT 1.2  PROT 6.5  ALBUMIN 3.7   Cardiac Enzymes  Recent Labs  11/14/14 0055 11/14/14 0720 11/14/14 1130  TROPONINI <0.03 <0.03 <0.03   BNP Invalid input(s): POCBNP D-Dimer  Recent Labs  11/13/14 1805  DDIMER 0.61*   Thyroid Function Tests  Recent Labs  11/13/14 2140  TSH 2.861    TELE Sinus brady with HR 50-60s, episode of sinus tach vs SVT    ECG  No new EKG  Echocardiogram 11/15/2014  -  Left ventricle: EF 15-20% high sphericity index no obvious mural apical thrombus. The cavity size was severely dilated. Wall thickness was normal. - Mitral valve: There was mild to moderate regurgitation. - Left atrium: The atrium was mildly dilated. - Right atrium: The atrium was mildly dilated. - Atrial septum: No defect or patent foramen ovale was identified. - Tricuspid valve: There was mild-moderate regurgitation. - Pulmonary arteries: PA peak pressure: 65 mm Hg (S).     Radiology/Studies  Dg Chest 2 View  11/13/2014   CLINICAL DATA:  Chest pain and pressure since this morning. Shortness of breath with ambulation. Cough last week. Low-grade fever.  EXAM: CHEST  2 VIEW  COMPARISON:  Rib series 4 11/21/2013  FINDINGS: Heart enlarged. There is prominence of interstitial markings. Kerley B-lines are present consistent with mild edema. There are no focal consolidations. No pleural effusions. Visualized osseous structures have  a normal appearance.  IMPRESSION: Cardiomegaly and mild interstitial edema.   Electronically Signed   By: Nolon Nations M.D.   On: 11/13/2014 19:11   Ct Angio Chest Pe W/cm &/or Wo Cm  11/13/2014   CLINICAL DATA:  Shortness of breath. Chest discomfort. Clinical suspicion for pulmonary embolism.  EXAM: CT ANGIOGRAPHY CHEST WITH CONTRAST  TECHNIQUE: Multidetector CT imaging of the chest was performed using the standard protocol during bolus administration of intravenous contrast. Multiplanar CT image reconstructions and MIPs were obtained to evaluate the vascular anatomy.  CONTRAST:  131mL OMNIPAQUE IOHEXOL 350 MG/ML SOLN  COMPARISON:  None.  FINDINGS: Mediastinum/Lymph Nodes: Satisfactory opacification of pulmonary arteries is noted, and no pulmonary embolism identified. Moderate cardiomegaly noted. No masses or pathologically enlarged lymph nodes identified.  Lungs/Pleura: Tiny pleural effusions present bilaterally. Diffuse thickening of pulmonary interstitial markings is seen which is most pronounced at the lung bases and most likely due to mild interstitial edema. No evidence of pulmonary consolidation or mass.  Musculoskeletal/Soft Tissues: No suspicious bone lesions or other significant chest wall abnormality.  Upper Abdomen:  Unremarkable.  Review of the MIP images confirms the above findings.  IMPRESSION: No evidence of pulmonary embolism.  Cardiomegaly, diffuse interstitial edema pattern, and tiny bilateral pleural effusions, consistent with mild congestive heart failure.   Electronically Signed   By: Earle Gell M.D.   On: 11/13/2014 20:39    ASSESSMENT AND PLAN  1. Acute heart failure: newly diagnosed  - reported previous cath 10 yrs ago at [facility] in [place] negative for CAD. Chronic LBBB  - recent URI, on Adderall for many years.   - futher workup with cath today to determine ICM vs NICM etiology  - continue BB and ACEI. Continue current dose of lasix. Unable to uptitrate due to hypotension.  May need to cut back lisinopril to 2.5mg  daily  - likely benefit from LifeVest, if no improvement in LV function on repeat echo in 3 month, may consider ICD  2. ADD 3. Chronic LBBB 4. SVT vs sinus tach: 2 episodes last night. Continue BB  Signed, Woodward Ku Pager: [phone]    The patient was seen, examined and discussed with Almyra Deforest, PA-C and I agree with the above.   A very pleasant 59 year old female with newly diagnosed acute systolic CHF, severely dilated LV, started on metoprolol and lisinopril, soft BP, will decrease lisinopril to 2.5 mg po daily. Continue the same dose of lasix, minimal fluid overload on exam. Awaiting left and right sided cath. Life vest oredred.  Dorothy Spark 11/16/2014

## 2014-11-17 NOTE — Plan of Care (Signed)
Problem: Phase I Progression Outcomes Goal: EF % per last Echo/documented,Core Reminder form on chart Outcome: Completed/Met Date Met:  11/17/14 EF performed on 11/15/2014 EF% result was 15-20%

## 2014-11-17 NOTE — Progress Notes (Signed)
Patient Name: Debra Holloway Date of Encounter: 11/17/2014   Principal Problem:   Acute systolic congestive heart failure Active Problems:   ADD (attention deficit disorder)   Hot flashes, menopausal   LBBB (left bundle branch block)   Dyslipidemia   Chest tightness   Idiopathic cardiomyopathy   Congestive heart disease   Cardiomegaly   SOB (shortness of breath)   SUBJECTIVE  Denies ever having any CP. SOB improved.   CURRENT MEDS . amphetamine-dextroamphetamine  10 mg Oral Q1200  . aspirin EC  325 mg Oral Daily  . dextromethorphan-guaiFENesin  1 tablet Oral BID  . fluticasone  1 spray Each Nare Daily  . furosemide  40 mg Oral Daily  . lisdexamfetamine  40 mg Oral Daily  . lisinopril  5 mg Oral Daily  . loratadine  10 mg Oral Daily  . metoprolol succinate  12.5 mg Oral Daily  . montelukast  10 mg Oral QHS  . sodium chloride  3 mL Intravenous Q12H  . sodium chloride  3 mL Intravenous Q12H   OBJECTIVE  Filed Vitals:   11/16/14 2042 11/17/14 0540 11/17/14 1004 11/17/14 1005  BP: 96/51 100/64 95/59 95/59   Pulse: 69 62 70   Temp: 98.1 F (36.7 C) 97.9 F (36.6 C)    TempSrc: Oral Oral    Resp: 18 20    Height:      Weight:  194 lb 14.2 oz (88.4 kg)    SpO2: 99% 97%      Intake/Output Summary (Last 24 hours) at 11/17/14 1054 Last data filed at 11/17/14 1000  Gross per 24 hour  Intake    410 ml  Output   1725 ml  Net  -1315 ml   Filed Weights   11/15/14 0549 11/16/14 0702 11/17/14 0540  Weight: 195 lb 3.2 oz (88.542 kg) 195 lb 15.8 oz (88.9 kg) 194 lb 14.2 oz (88.4 kg)    PHYSICAL EXAM  General: Pleasant, NAD. Neuro: Alert and oriented X 3. Moves all extremities spontaneously. Psych: Normal affect. HEENT:  Normal  Neck: Supple without bruits or JVD. Lungs:  Resp regular and unlabored, CTA. Heart: RRR no s3, s4, or murmurs. Abdomen: Soft, non-tender, non-distended, BS + x 4.  Extremities: No clubbing, cyanosis or edema. DP/PT/Radials 2+ and equal  bilaterally.  Accessory Clinical Findings  Basic Metabolic Panel  Recent Labs  11/16/14 0410 11/17/14 0256  NA 140 140  K 3.7 3.6  CL 103 106  CO2 27 21  GLUCOSE 94 92  BUN 17 21  CREATININE 0.99 0.99  CALCIUM 9.0 9.2   Liver Function Tests No results for input(s): AST, ALT, ALKPHOS, BILITOT, PROT, ALBUMIN in the last 72 hours. Cardiac Enzymes  Recent Labs  11/14/14 1130  TROPONINI <0.03   TELE Sinus brady with HR 60-70   ECG  No new EKG  Echocardiogram 11/15/2014  - Left ventricle: EF 15-20% high sphericity index no obvious mural apical thrombus. The cavity size was severely dilated. Wall thickness was normal. - Mitral valve: There was mild to moderate regurgitation. - Left atrium: The atrium was mildly dilated. - Right atrium: The atrium was mildly dilated. - Atrial septum: No defect or patent foramen ovale was identified. - Tricuspid valve: There was mild-moderate regurgitation. - Pulmonary arteries: PA peak pressure: 65 mm Hg (S).     Radiology/Studies  Dg Chest 2 View  11/13/2014   CLINICAL DATA:  Chest pain and pressure since this morning. Shortness of breath with ambulation. Cough last week.  Low-grade fever.  EXAM: CHEST  2 VIEW  COMPARISON:  Rib series 4 11/21/2013  FINDINGS: Heart enlarged. There is prominence of interstitial markings. Kerley B-lines are present consistent with mild edema. There are no focal consolidations. No pleural effusions. Visualized osseous structures have a normal appearance.  IMPRESSION: Cardiomegaly and mild interstitial edema.   Electronically Signed   By: Nolon Nations M.D.   On: 11/13/2014 19:11   Ct Angio Chest Pe W/cm &/or Wo Cm  11/13/2014   CLINICAL DATA:  Shortness of breath. Chest discomfort. IMPRESSION: No evidence of pulmonary embolism.  Cardiomegaly, diffuse interstitial edema pattern, and tiny bilateral pleural effusions, consistent with mild congestive heart failure.   Electronically Signed   By: Earle Gell M.D.   On: 11/13/2014 20:39    ASSESSMENT AND PLAN  1. Acute heart failure: newly diagnosed  - reported previous cath 10 yrs ago at [facility] in [place] negative for CAD. Chronic LBBB  - normal cath today consistent with non-ischemic dilated CMP  - continue the same dose of lasix, she appears euvolemic, Crea is stable   - on Adderall for many years - we will discuss possible weaning or decreasing dosage.  2. non-ischemic dilated CMP, LVEF 15%, severely dilated LV  - continue BB and ACEI, unable to uptitrate as low BP  - Continue current dose of lasix. Unable to uptitrate due to hypotension. We will try to add spironolactone at the outpatient visit  - awaiting Lifevest  - can be discharged today with follow up in the clinic within 2 weeks and repeat echo in 3 months  3. ADD as above   Dorothy Spark 11/17/2014

## 2014-11-17 NOTE — Discharge Summary (Addendum)
PATIENT DETAILS Name: Debra Holloway Age: 59 y.o. Sex: female Date of Birth: April 22, 1956 MRN: [id number]. Admitting Physician: Berle Mull, MD XIP:[id number], Sandria Manly, PA-C  Admit Date: 11/13/2014 Discharge date: 11/18/2014  Recommendations for Outpatient Follow-up:  1. Check Electrolytes at next follow up 2. Optimize ACE/Beta blocker/Lasix regimen  PRIMARY DISCHARGE DIAGNOSIS:  Principal Problem:   Acute systolic congestive heart failure Active Problems:   ADD (attention deficit disorder)   Hot flashes, menopausal   LBBB (left bundle branch block)   Dyslipidemia   Chest tightness   Idiopathic cardiomyopathy   Congestive heart disease   Cardiomegaly   SOB (shortness of breath)   NICM (nonischemic cardiomyopathy)   Nonischemic dilated cardiomyopathy      PAST MEDICAL HISTORY: Past Medical History  Diagnosis Date  . ADD (attention deficit disorder)   . Environmental allergies   . Chicken pox   . H/O vitamin D deficiency   . Seasonal affective disorder   . LBBB (left bundle branch block)     W/1st degree AV Block, Avoid Beta Blockers  . AR (allergic rhinitis)   . Depression   . Dyslipidemia     DISCHARGE MEDICATIONS: Current Discharge Medication List    START taking these medications   Details  furosemide (LASIX) 40 MG tablet Take 1 tablet (40 mg total) by mouth daily. Qty: 30 tablet, Refills: 0    lisinopril (PRINIVIL,ZESTRIL) 5 MG tablet Take 1 tablet (5 mg total) by mouth daily. Qty: 30 tablet, Refills: 0    metoprolol succinate (TOPROL-XL) 25 MG 24 hr tablet Take 0.5 tablets (12.5 mg total) by mouth daily. Qty: 60 tablet, Refills: 0      CONTINUE these medications which have NOT CHANGED   Details  amphetamine-dextroamphetamine (ADDERALL) 10 MG tablet Take 10 mg by mouth every evening.    fexofenadine-pseudoephedrine (ALLEGRA-D 24) 180-240 MG per 24 hr tablet Take 1 tablet by mouth daily.    fluticasone (FLONASE) 50 MCG/ACT nasal spray Place 1  spray into both nostrils daily.    lisdexamfetamine (VYVANSE) 40 MG capsule Take 1 capsule (40 mg total) by mouth every morning. Qty: 30 capsule, Refills: 0   Associated Diagnoses: Visit for preventive health examination    montelukast (SINGULAIR) 10 MG tablet Take 1 tablet (10 mg total) by mouth at bedtime. Qty: 90 tablet, Refills: 1   Associated Diagnoses: Environmental allergies    Vitamin D, Ergocalciferol, (DRISDOL) 50000 UNITS CAPS capsule Take 1 capsule (50,000 Units total) by mouth every 7 (seven) days. Qty: 8 capsule, Refills: 0   Associated Diagnoses: Vitamin D deficiency        ALLERGIES:   Allergies  Allergen Reactions  . Bee Venom Anaphylaxis  . Erythromycin Itching and Rash  . Penicillins Hives, Itching and Rash    BRIEF HPI:  See H&P, Labs, Consult and Test reports for all details in brief, patient was admitted for evaluation of exertional dyspnea.  CONSULTATIONS:   cardiology  PERTINENT RADIOLOGIC STUDIES: Dg Chest 2 View  11/13/2014   CLINICAL DATA:  Chest pain and pressure since this morning. Shortness of breath with ambulation. Cough last week. Low-grade fever.  EXAM: CHEST  2 VIEW  COMPARISON:  Rib series 4 11/21/2013  FINDINGS: Heart enlarged. There is prominence of interstitial markings. Kerley B-lines are present consistent with mild edema. There are no focal consolidations. No pleural effusions. Visualized osseous structures have a normal appearance.  IMPRESSION: Cardiomegaly and mild interstitial edema.   Electronically Signed   By: Nolon Nations M.D.  On: 11/13/2014 19:11   Ct Angio Chest Pe W/cm &/or Wo Cm  11/13/2014   CLINICAL DATA:  Shortness of breath. Chest discomfort. Clinical suspicion for pulmonary embolism.  EXAM: CT ANGIOGRAPHY CHEST WITH CONTRAST  TECHNIQUE: Multidetector CT imaging of the chest was performed using the standard protocol during bolus administration of intravenous contrast. Multiplanar CT image reconstructions and MIPs were  obtained to evaluate the vascular anatomy.  CONTRAST:  149mL OMNIPAQUE IOHEXOL 350 MG/ML SOLN  COMPARISON:  None.  FINDINGS: Mediastinum/Lymph Nodes: Satisfactory opacification of pulmonary arteries is noted, and no pulmonary embolism identified. Moderate cardiomegaly noted. No masses or pathologically enlarged lymph nodes identified.  Lungs/Pleura: Tiny pleural effusions present bilaterally. Diffuse thickening of pulmonary interstitial markings is seen which is most pronounced at the lung bases and most likely due to mild interstitial edema. No evidence of pulmonary consolidation or mass.  Musculoskeletal/Soft Tissues: No suspicious bone lesions or other significant chest wall abnormality.  Upper Abdomen:  Unremarkable.  Review of the MIP images confirms the above findings.  IMPRESSION: No evidence of pulmonary embolism.  Cardiomegaly, diffuse interstitial edema pattern, and tiny bilateral pleural effusions, consistent with mild congestive heart failure.   Electronically Signed   By: Earle Gell M.D.   On: 11/13/2014 20:39     PERTINENT LAB RESULTS: CBC: No results for input(s): WBC, HGB, HCT, PLT in the last 72 hours. CMET CMP     Component Value Date/Time   NA 140 11/17/2014 0256   K 3.6 11/17/2014 0256   CL 106 11/17/2014 0256   CO2 21 11/17/2014 0256   GLUCOSE 92 11/17/2014 0256   BUN 21 11/17/2014 0256   CREATININE 0.99 11/17/2014 0256   CALCIUM 9.2 11/17/2014 0256   PROT 6.5 11/14/2014 0720   ALBUMIN 3.7 11/14/2014 0720   AST 30 11/14/2014 0720   ALT 31 11/14/2014 0720   ALKPHOS 57 11/14/2014 0720   BILITOT 1.2 11/14/2014 0720   GFRNONAA 62* 11/17/2014 0256   GFRAA 72* 11/17/2014 0256    GFR Estimated Creatinine Clearance: 70.2 mL/min (by C-G formula based on Cr of 0.99). No results for input(s): LIPASE, AMYLASE in the last 72 hours. No results for input(s): CKTOTAL, CKMB, CKMBINDEX, TROPONINI in the last 72 hours. Invalid input(s): POCBNP No results for input(s): DDIMER in  the last 72 hours. No results for input(s): HGBA1C in the last 72 hours. No results for input(s): CHOL, HDL, LDLCALC, TRIG, CHOLHDL, LDLDIRECT in the last 72 hours. No results for input(s): TSH, T4TOTAL, T3FREE, THYROIDAB in the last 72 hours.  Invalid input(s): FREET3 No results for input(s): VITAMINB12, FOLATE, FERRITIN, TIBC, IRON, RETICCTPCT in the last 72 hours. Coags:  Recent Labs  11/16/14 0410  INR 1.05   Microbiology: Recent Results (from the past 240 hour(s))  Rapid strep screen     Status: None   Collection Time: 11/14/14  2:28 AM  Result Value Ref Range Status   Streptococcus, Group A Screen (Direct) NEGATIVE NEGATIVE Final    Comment: (NOTE) A Rapid Antigen test may result negative if the antigen level in the sample is below the detection level of this test. The FDA has not cleared this test as a stand-alone test therefore the rapid antigen negative result has reflexed to a Group A Strep culture.   Culture, Group A Strep     Status: None   Collection Time: 11/14/14  2:28 AM  Result Value Ref Range Status   Specimen Description THROAT  Final   Special Requests NONE  Final   Culture   Final    No Beta Hemolytic Streptococci Isolated Performed at [facility]    Report Status 11/16/2014 FINAL  Final     BRIEF HOSPITAL COURSE:  Acute systolic CHF (congestive heart failure): Admitted with exertional dyspnea, significantly compensated after getting furosemide. 2-D echocardiogram demonstrates significantly reduced ejection fraction of around 15-20%.Underwent LHC which showed normal coronaries.  Currently tolerating low doses of lisinopril, metoprolol and Lasix.Cardiology recommending a life vest which will be fitted later today, following which patient will be discharged home with close outpatient cardiology follow up.   Active Problems:  Pulmonary hypertension: Probably secondary to severe systolic dysfunction. Defer further workup to cardiology   URI:  Complains of some nasal and sinus congestion. Continue Flonase and loratadine and as needed Afrin. Influenza PCR and strep throat negative.   ADD/anxiety disorder: Stable, continue with Adderall and Xanax.    TODAY-DAY OF DISCHARGE:  Subjective:   Debra Holloway today has no headache,no chest abdominal pain,no new weakness tingling or numbness, feels much better wants to go home today.   Objective:   Blood pressure 123/85, pulse 75, temperature 97.9 F (36.6 C), temperature source Oral, resp. rate 16, height 5\' 6"  (1.676 m), weight 88.4 kg (194 lb 14.2 oz), SpO2 97 %.  Intake/Output Summary (Last 24 hours) at 11/17/14 1420 Last data filed at 11/17/14 1000  Gross per 24 hour  Intake    410 ml  Output   1725 ml  Net  -1315 ml   Filed Weights   11/15/14 0549 11/16/14 0702 11/17/14 0540  Weight: 88.542 kg (195 lb 3.2 oz) 88.9 kg (195 lb 15.8 oz) 88.4 kg (194 lb 14.2 oz)    Exam Awake Alert, Oriented *3, No new F.N deficits, Normal affect [place].AT,PERRAL Supple Neck,No JVD, No cervical lymphadenopathy appriciated.  Symmetrical Chest wall movement, Good air movement bilaterally, CTAB RRR,No Gallops,Rubs or new Murmurs, No Parasternal Heave +ve B.Sounds, Abd Soft, Non tender, No organomegaly appriciated, No rebound -guarding or rigidity. No Cyanosis, Clubbing or edema, No new Rash or bruise  DISCHARGE CONDITION: Stable  DISPOSITION: Home  DISCHARGE INSTRUCTIONS:    Activity:  As tolerated   Diet recommendation: Heart Healthy diet  Discharge Instructions    ACE Inhibitor / ARB already ordered    Complete by:  As directed      Avoid straining    Complete by:  As directed      Beta Blocker already ordered    Complete by:  As directed      Diet - low sodium heart healthy    Complete by:  As directed      Heart Failure patients record your daily weight using the same scale at the same time of day    Complete by:  As directed   If you have more than 3 pound weight gain in a  day or more than 5 pounds in a week, please call your cardiologist's office.     Increase activity slowly    Complete by:  As directed      STOP any activity that causes chest pain, shortness of breath, dizziness, sweating, or exessive weakness    Complete by:  As directed            Follow-up Information    Follow up with Dorothy Spark, MD On 11/20/2014.   Specialty:  Cardiology   Why:  3:45pm   Contact information:   [place]  [id number] [phone]       Total Time spent on discharge equals 45 minutes.  SignedOren Binet 11/17/2014 2:20 PM

## 2014-11-17 NOTE — Progress Notes (Addendum)
Discussed with Monica Martinez, LifeVest Rep, likely will be fitted with Lifevest around 6:45pm tonight.   Hilbert Corrigan PA Pager: [phone]  Addendum:  Cath report noted, per Dr. Meda Coffee, patient will be fitted with LifeVest. Discussed with IM, will observe overnight and discharge tomorrow. I will arrange outpatient followup.  Hilbert Corrigan PA Pager: [phone]

## 2014-11-17 NOTE — Progress Notes (Signed)
Discussed patient with Dr. Angie Fava has follow-up with Dr. Meda Coffee on 11/20/14 at 3:45pm therefore I will not make an AHF outpatient follow-up appt at this time.

## 2014-11-18 ENCOUNTER — Inpatient Hospital Stay (HOSPITAL_COMMUNITY): Payer: 59

## 2014-11-18 DIAGNOSIS — I428 Other cardiomyopathies: Secondary | ICD-10-CM

## 2014-11-18 DIAGNOSIS — R55 Syncope and collapse: Secondary | ICD-10-CM | POA: Diagnosis present

## 2014-11-18 DIAGNOSIS — R209 Unspecified disturbances of skin sensation: Secondary | ICD-10-CM | POA: Insufficient documentation

## 2014-11-18 LAB — BASIC METABOLIC PANEL
ANION GAP: 4 — AB (ref 5–15)
BUN: 16 mg/dL (ref 6–23)
CO2: 26 mmol/L (ref 19–32)
Calcium: 8.9 mg/dL (ref 8.4–10.5)
Chloride: 107 mmol/L (ref 96–112)
Creatinine, Ser: 0.93 mg/dL (ref 0.50–1.10)
GFR calc Af Amer: 78 mL/min — ABNORMAL LOW (ref >90)
GFR calc non Af Amer: 67 mL/min — ABNORMAL LOW (ref >90)
Glucose, Bld: 96 mg/dL (ref 70–99)
Potassium: 3.6 mmol/L (ref 3.5–5.1)
SODIUM: 137 mmol/L (ref 135–145)

## 2014-11-18 LAB — TROPONIN I
Troponin I: <0.03 ng/mL (ref <0.031)
Troponin I: <0.03 ng/mL (ref <0.031)

## 2014-11-18 LAB — CBC
HEMATOCRIT: 37.6 % (ref 36.0–46.0)
Hemoglobin: 12.4 g/dL (ref 12.0–15.0)
MCH: 30 pg (ref 26.0–34.0)
MCHC: 33 g/dL (ref 30.0–36.0)
MCV: 91 fL (ref 78.0–100.0)
Platelets: 246 10*3/uL (ref 150–400)
RBC: 4.13 MIL/uL (ref 3.87–5.11)
RDW: 13.3 % (ref 11.5–15.5)
WBC: 6.5 10*3/uL (ref 4.0–10.5)

## 2014-11-18 MED ORDER — LISINOPRIL 2.5 MG PO TABS
2.5000 mg | ORAL_TABLET | Freq: Every day | ORAL | Status: DC
  Administered 2014-11-19: 2.5 mg via ORAL
  Filled 2014-11-18: qty 1

## 2014-11-18 NOTE — Progress Notes (Signed)
Rounding on patient discussing family support and stated she all of a sudden stated everything when black pt talking through the episode lasted about 2 minutes. Tele and IV was removed for discharge home. Pt sitting in the chair and returned to bed cardiology and primary MD made aware. RRT also notified and at the bedside.

## 2014-11-18 NOTE — Progress Notes (Signed)
I spoke briefly with Ms. Moffa regarding her HF.  After doing some reinforcement --I ended my teaching because she became upset as the nurse gave her morning medications.  She seemed overwhelmed and was somewhat tearful regarding the hospital stay.  She even mentioned that you all "are trying to make me old".  I encouraged to take all of this information one day at a time and encouraged her to call me with any concerns/questions after discharge if needed.  She is scheduled for follow-up on Monday 11/23/14 at 11am with Truitt Merle.  She cancelled previous appt due to transportation issues.

## 2014-11-18 NOTE — Progress Notes (Signed)
Per staff patient with near syncopal event just prior to being discharged.  Heart monitor had just been removed and they were about to apply life vest.  No arrhthymias note when placed on heart monitor.  After patient assisted to the bed she began to feel better, but still weak and anxious.  Bp 98/50  SR 76  O2 sat 96% on RA.  Lung sounds clear, heart tones regular.  12 lead EKG done per orders.  Dr Tat at bedside to assess patient.  RN to call if assistance is needed.

## 2014-11-18 NOTE — Progress Notes (Signed)
Patient Name: Debra Holloway Date of Encounter: 11/18/2014    Principal Problem:  Acute systolic congestive heart failure Active Problems:  ADD (attention deficit disorder)  Hot flashes, menopausal  LBBB (left bundle branch block)  Dyslipidemia  Chest tightness  Idiopathic cardiomyopathy  Congestive heart disease  Cardiomegaly  SOB (shortness of breath)  NICM (nonischemic cardiomyopathy)  Nonischemic dilated cardiomyopathy    SUBJECTIVE  Didn't rest well overnight due to staff having to come in and out. No CP. SOB improved, she states it is much easier to breathe at night when she elevated the HOB. Anxious about discharge later today, multiple questions.  CURRENT MEDS . amphetamine-dextroamphetamine 10 mg Oral Q1200  . aspirin EC 81 mg Oral Daily  . dextromethorphan-guaiFENesin 1 tablet Oral BID  . fluticasone 1 spray Each Nare Daily  . furosemide 40 mg Oral Daily  . heparin 5,000 Units Subcutaneous 3 times per day  . lisdexamfetamine 40 mg Oral Daily  . lisinopril 5 mg Oral Daily  . loratadine 10 mg Oral Daily  . metoprolol succinate 12.5 mg Oral Daily  . montelukast 10 mg Oral QHS  . sodium chloride 3 mL Intravenous Q12H    OBJECTIVE  Filed Vitals:   11/17/14 2116 11/18/14 0104 11/18/14 0523 11/18/14 0729  BP: 98/49 98/54 105/64   Pulse: 66 64 76   Temp: 98.4 F (36.9 C) 97.9 F (36.6 C) 98.2 F (36.8 C)   TempSrc: Oral Oral Oral   Resp: 18 18 18    Height:      Weight:    194 lb 7.1 oz (88.2 kg)  SpO2: 96% 98% 98%     Intake/Output Summary (Last 24 hours) at 11/18/14 0857 Last data filed at 11/18/14 3976  Gross per 24 hour  Intake 1314.17 ml  Output  2000 ml  Net -685.83 ml   Filed Weights   11/16/14 0702 11/17/14 0540 11/18/14 0729  Weight: 195 lb 15.8 oz (88.9 kg) 194 lb 14.2 oz (88.4 kg) 194 lb 7.1  oz (88.2 kg)    PHYSICAL EXAM  General: Pleasant, NAD. Neuro: Alert and oriented X 3. Moves all extremities spontaneously. Psych: Anxious, normal affect. HEENT: Normal Neck: Supple without bruits or JVD. Lungs: Resp regular and unlabored, CTA. Heart: RRR no s3, s4, or murmurs. Abdomen: Soft, non-tender, non-distended, BS + x 4.  Extremities: No clubbing, cyanosis or edema. DP/PT/Radials 2+ and equal bilaterally.  Accessory Clinical Findings  CBC  Recent Labs (last 2 labs)      Recent Labs  11/18/14 0404  WBC 6.5  HGB 12.4  HCT 37.6  MCV 91.0  PLT 246     Basic Metabolic Panel  Recent Labs (last 2 labs)      Recent Labs  11/17/14 0256 11/18/14 0404  NA 140 137  K 3.6 3.6  CL 106 107  CO2 21 26  GLUCOSE 92 96  BUN 21 16  CREATININE 0.99 0.93  CALCIUM 9.2 8.9     TELE  SR, 83, brief runs to a tach in setting of lbbb.  Radiology/Studies   Imaging Results    Dg Chest 2 View  11/13/2014 CLINICAL DATA: Chest pain and pressure since this morning. Shortness of breath with ambulation. Cough last week. Low-grade fever. EXAM: CHEST 2 VIEW COMPARISON: Rib series 4 11/21/2013 FINDINGS: Heart enlarged. There is prominence of interstitial markings. Kerley B-lines are present consistent with mild edema. There are no focal consolidations. No pleural effusions. Visualized osseous structures have a normal appearance. IMPRESSION: Cardiomegaly  and mild interstitial edema. Electronically Signed By: Nolon Nations M.D. On: 11/13/2014 19:11   Ct Angio Chest Pe W/cm &/or Wo Cm  11/13/2014 CLINICAL DATA: Shortness of breath. Chest discomfort. Clinical suspicion for pulmonary embolism. EXAM: CT ANGIOGRAPHY CHEST WITH CONTRAST TECHNIQUE: Multidetector CT imaging of the chest was performed using the standard protocol during bolus administration of intravenous contrast. Multiplanar CT image reconstructions and MIPs  were obtained to evaluate the vascular anatomy. CONTRAST: 170mL OMNIPAQUE IOHEXOL 350 MG/ML SOLN COMPARISON: None. FINDINGS: Mediastinum/Lymph Nodes: Satisfactory opacification of pulmonary arteries is noted, and no pulmonary embolism identified. Moderate cardiomegaly noted. No masses or pathologically enlarged lymph nodes identified. Lungs/Pleura: Tiny pleural effusions present bilaterally. Diffuse thickening of pulmonary interstitial markings is seen which is most pronounced at the lung bases and most likely due to mild interstitial edema. No evidence of pulmonary consolidation or mass. Musculoskeletal/Soft Tissues: No suspicious bone lesions or other significant chest wall abnormality. Upper Abdomen: Unremarkable. Review of the MIP images confirms the above findings. IMPRESSION: No evidence of pulmonary embolism. Cardiomegaly, diffuse interstitial edema pattern, and tiny bilateral pleural effusions, consistent with mild congestive heart failure. Electronically Signed By: Earle Gell M.D. On: 11/13/2014 20:39     ASSESSMENT AND PLAN  1. Acute systolic heart failure/NICM: Echo on 2/14 with an EF of 15 to 20%. Cardiac cath completed on 2/16 with nl cors and severely dilated LV; EF of equal to or less than 15%. Euvolemic on exam. No CP, SOB has improved, still + for mild orthopnea. Continue lasix, bb, and acei.  No room for titration given relative hypotn.  Lifevest placed with plan for outpt echo in 3 mos.  2.  WCT:  Pt with several episodes of WCT on tele associated with subjective palpitations.  Review of tele shows same axis as regular rhythm, more suggestive of PAT/SVT in setting of LBBB as opposed to VT.  Cont low dose bb as tolerated.  3. ADD: Has been refusing Adderall while here in the hospital, concerned about stopping due to the inability to fulfill job duties without it - but understands that we'd prefer that she remain off of it.  Signed, Debra Hodgkins NP      The  patient was seen, examined and discussed with Debra Rima, NP and agree as above.  Normal cath yesterday, obtained Lifevest the last night, CHF education provided, follow up appointment the next Monday 11/23/2014. Discharge home today, appears euvolemic.  Debra Holloway 11/18/2014

## 2014-11-18 NOTE — Consult Note (Signed)
NEURO HOSPITALIST CONSULT NOTE    Reason for Consult: episode of chest pain, dizziness with sensation of darkness and bilateral hand tingling.   Marland Kitchen   HPI:                                                                                                                                          Debra Holloway is an 59 y.o. female with Past medical history of ADD, anxiety disorder, left bundle branch block.  She was brought to hospital due to SOB and viral like symptoms. While hospitalized she has a Heart catheterization showed nonischemic cardiomyopathy with sever dilated left ventricle and EF of <15%. Patient is being treated for CHF and URI while in hospital. Today patient was sitting in a chair when developed an episode of dizziness and chest discomfort and a sensation as though she was light headed. She then described a sensation of darkness and loss of vision which was  associated nausea with some numbness and tingling of her bilateral hands. She was placed back in bed and laid flat where symptoms gradually resolved. Patients BP has remained in the high 90's low 100 systaltically.  Currently she is asymptomatic. Of note: patient was going to be discharged just prior to this happening and was not on cardiac telemetry.  Past Medical History  Diagnosis Date  . ADD (attention deficit disorder)   . Environmental allergies   . Chicken pox   . H/O vitamin D deficiency   . Seasonal affective disorder   . LBBB (left bundle branch block)     W/1st degree AV Block, Avoid Beta Blockers  . AR (allergic rhinitis)   . Depression   . Dyslipidemia     Past Surgical History  Procedure Laterality Date  . Dilation and curettage of uterus  2003  . Abdominal hysterectomy  2004  . Wisdom tooth extraction  1986  . Left and right heart catheterization with coronary angiogram N/A 11/17/2014    Procedure: LEFT AND RIGHT HEART CATHETERIZATION WITH CORONARY ANGIOGRAM;  Surgeon: Troy Sine,  MD;  Location: [facility] CATH LAB;  Service: Cardiovascular;  Laterality: N/A;    Family History  Problem Relation Age of Onset  . COPD Mother     Living  . Pancreatic cancer Father 5    Deceased  . Lung cancer Paternal Grandfather   . Heart disease Paternal Grandmother   . Hypertension Paternal Grandmother   . Alzheimer's disease Maternal Grandfather   . Arthritis/Rheumatoid Maternal Grandmother   . Heart disease Maternal Grandmother     Tachycardia  . Cancer Paternal Uncle   . Breast cancer Sister 31  . Healthy Brother     x3  . Healthy Daughter     x3      Social History:  reports that she has never smoked. She has never used smokeless tobacco. She reports that she drinks about 2.4 oz of alcohol per week. She reports that she does not use illicit drugs.  Allergies  Allergen Reactions  . Bee Venom Anaphylaxis  . Erythromycin Itching and Rash  . Penicillins Hives, Itching and Rash    MEDICATIONS:                                                                                                                     Prior to Admission:  Prescriptions prior to admission  Medication Sig Dispense Refill Last Dose  . amphetamine-dextroamphetamine (ADDERALL) 10 MG tablet Take 10 mg by mouth every evening.   11/13/2014 at Unknown time  . fexofenadine-pseudoephedrine (ALLEGRA-D 24) 180-240 MG per 24 hr tablet Take 1 tablet by mouth daily.   Past Week at Unknown time  . fluticasone (FLONASE) 50 MCG/ACT nasal spray Place 1 spray into both nostrils daily.   11/13/2014 at Unknown time  . lisdexamfetamine (VYVANSE) 40 MG capsule Take 1 capsule (40 mg total) by mouth every morning. 30 capsule 0 11/13/2014 at Unknown time  . montelukast (SINGULAIR) 10 MG tablet Take 1 tablet (10 mg total) by mouth at bedtime. 90 tablet 1 Past Month at Unknown time  . Vitamin D, Ergocalciferol, (DRISDOL) 50000 UNITS CAPS capsule Take 1 capsule (50,000 Units total) by mouth every 7 (seven) days. (Patient taking  differently: Take 50,000 Units by mouth every 7 (seven) days. No specific day) 8 capsule 0 Past Week at Unknown time   Scheduled: . amphetamine-dextroamphetamine  10 mg Oral Q1200  . aspirin EC  81 mg Oral Daily  . dextromethorphan-guaiFENesin  1 tablet Oral BID  . fluticasone  1 spray Each Nare Daily  . furosemide  40 mg Oral Daily  . heparin  5,000 Units Subcutaneous 3 times per day  . lisdexamfetamine  40 mg Oral Daily  . [START ON 11/19/2014] lisinopril  2.5 mg Oral Daily  . loratadine  10 mg Oral Daily  . metoprolol succinate  12.5 mg Oral Daily  . montelukast  10 mg Oral QHS  . sodium chloride  3 mL Intravenous Q12H     ROS:                                                                                                                                       History obtained  from the patient  General ROS: negative for - chills, fatigue, fever, night sweats, weight gain or weight loss Psychological ROS: negative for - behavioral disorder, hallucinations, memory difficulties, mood swings or suicidal ideation Ophthalmic ROS: negative for - blurry vision, double vision, eye pain or loss of vision ENT ROS: negative for - epistaxis, nasal discharge, oral lesions, sore throat, tinnitus or vertigo Allergy and Immunology ROS: negative for - hives or itchy/watery eyes Hematological and Lymphatic ROS: negative for - bleeding problems, bruising or swollen lymph nodes Endocrine ROS: negative for - galactorrhea, hair pattern changes, polydipsia/polyuria or temperature intolerance Respiratory ROS: negative for - cough, hemoptysis, shortness of breath or wheezing Cardiovascular ROS: negative for - chest pain, dyspnea on exertion, edema or irregular heartbeat Gastrointestinal ROS: negative for - abdominal pain, diarrhea, hematemesis, nausea/vomiting or stool incontinence Genito-Urinary ROS: negative for - dysuria, hematuria, incontinence or urinary frequency/urgency Musculoskeletal ROS: negative  for - joint swelling or muscular weakness Neurological ROS: as noted in HPI Dermatological ROS: negative for rash and skin lesion changes   Blood pressure 98/56, pulse 71, temperature 98.2 F (36.8 C), temperature source Oral, resp. rate 18, height 5\' 6"  (1.676 m), weight 88.2 kg (194 lb 7.1 oz), SpO2 100 %.   Examination:                                                                                                      HEENT-  Normocephalic, no lesions, without obvious abnormality.  Normal external eye and conjunctiva.  Normal TM's bilaterally.  Normal auditory canals and external ears. Normal external nose, mucus membranes and septum.  Normal pharynx. Cardiovascular- S1, S2 normal, pulses palpable throughout   Lungs- chest clear, no wheezing, rales, normal symmetric air entry Abdomen- normal findings: bowel sounds normal Extremities- no edema Lymph-no adenopathy palpable Musculoskeletal-no joint tenderness, deformity or swelling Skin-warm and dry, no hyperpigmentation, vitiligo, or suspicious lesions  Neurological Examination Mental Status: Alert, oriented, thought content appropriate.  Speech fluent without evidence of aphasia.  Able to follow 3 step commands without difficulty. Cranial Nerves: II: Discs flat bilaterally; Visual fields grossly normal, pupils equal, round, reactive to light and accommodation III,IV, VI: ptosis not present, extra-ocular motions intact bilaterally V,VII: smile symmetric, facial light touch sensation normal bilaterally VIII: hearing normal bilaterally IX,X: gag reflex present XI: bilateral shoulder shrug XII: midline tongue extension Motor: Right : Upper extremity   5/5    Left:     Upper extremity   5/5  Lower extremity   5/5     Lower extremity   5/5 Tone and bulk:normal tone throughout; no atrophy noted Sensory: Pinprick and light touch intact throughout, bilaterally Deep Tendon Reflexes: 2+ and symmetric throughout Plantars: Right:  downgoing   Left: downgoing Cerebellar: normal finger-to-nose, normal rapid alternating movements and normal heel-to-shin test Gait: not assessed due to multiple leads.     Lab Results: Basic Metabolic Panel:  Recent Labs Lab 11/14/14 0720 11/15/14 0506 11/16/14 0410 11/17/14 0256 11/18/14 0404  NA 140 139 140 140 137  K 3.8 4.1 3.7 3.6 3.6  CL 107 107  103 106 107  CO2 26 24 27 21 26   GLUCOSE 101* 94 94 92 96  BUN 12 15 17 21 16   CREATININE 0.98 0.94 0.99 0.99 0.93  CALCIUM 8.8 9.1 9.0 9.2 8.9    Liver Function Tests:  Recent Labs Lab 11/14/14 0720  AST 30  ALT 31  ALKPHOS 57  BILITOT 1.2  PROT 6.5  ALBUMIN 3.7   No results for input(s): LIPASE, AMYLASE in the last 168 hours. No results for input(s): AMMONIA in the last 168 hours.  CBC:  Recent Labs Lab 11/13/14 1805 11/14/14 0720 11/18/14 0404  WBC 7.0 5.0 6.5  NEUTROABS 4.5 3.0  --   HGB 11.6* 10.9* 12.4  HCT 35.5* 33.2* 37.6  MCV 92.7 91.2 91.0  PLT 235 241 246    Cardiac Enzymes:  Recent Labs Lab 11/13/14 1805 11/14/14 0055 11/14/14 0720 11/14/14 1130  TROPONINI <0.03 <0.03 <0.03 <0.03    Lipid Panel: No results for input(s): CHOL, TRIG, HDL, CHOLHDL, VLDL, LDLCALC in the last 168 hours.  CBG: No results for input(s): GLUCAP in the last 168 hours.  Microbiology: Results for orders placed or performed during the hospital encounter of 11/13/14  Rapid strep screen     Status: None   Collection Time: 11/14/14  2:28 AM  Result Value Ref Range Status   Streptococcus, Group A Screen (Direct) NEGATIVE NEGATIVE Final    Comment: (NOTE) A Rapid Antigen test may result negative if the antigen level in the sample is below the detection level of this test. The FDA has not cleared this test as a stand-alone test therefore the rapid antigen negative result has reflexed to a Group A Strep culture.   Culture, Group A Strep     Status: None   Collection Time: 11/14/14  2:28 AM  Result Value Ref  Range Status   Specimen Description THROAT  Final   Special Requests NONE  Final   Culture   Final    No Beta Hemolytic Streptococci Isolated Performed at [facility]    Report Status 11/16/2014 FINAL  Final    Coagulation Studies:  Recent Labs  11/16/14 0410  LABPROT 13.8  INR 1.05    Imaging: No results found.   Etta Quill PA-C [facility] [phone]  11/18/2014, 3:29 PM   Assessment/Plan: 59 YO female with known HF and systolic EF of <60% likely experienced a near syncopal episode with probable transient hypotension on standing with reduced brain perfusion. Symptoms abated when patient was laid flat. Stroke workup TIA is unlikely with no unilateral focal deficits and no receptive or expressive aphasia.    Recommend: 1) place on telemetry 2) BP control 3) agree with obtaining a head CT--if negative no further neurological work up recommended.    I personally participated in this patient's evaluation and management, including formulating the above clinical assessment and management recommendations.  Rush Farmer M.D. [facility] [phone]

## 2014-11-18 NOTE — Progress Notes (Signed)
Called by RN to see pt I arrived around 1455 to bedside  While sitting in chair, pt developed an episode of dizziness and chest discomfort. She also described a sensation of darkness and loss of vision. She has associated nausea with some numbness and tingling of her bilateral hands. The patient was transferred over to her bed where she was laid down supine. The symptoms lasted approximately 5-10 minutes. Presently, she still feels some vague chest discomfort, but stated that her numbness in her hands have resolved, but she continues to feel some heaviness in her bilateral hands. She denies any focal extremity weakness, facial droop, or dysarthria.  HR 72--RR18--105/64--100% 2L CV--RRR, +S3 Lung--CTA Abd--soft/NT+BS Ext, no rash, edema  EKG--unchanged LBBB, sinus  Stat CT brain Cycle troponins Remain on tele Supplemental oxygen Consulted neurology to see patient--spoke with Dr. Fara Olden  DTat

## 2014-11-18 NOTE — Progress Notes (Addendum)
PROGRESS NOTE  Debra Holloway CHE:[id number] DOB: [birth year] DOA: 11/13/2014 PCP: Leeanne Rio, PA-C  Assessment/Plan:  Acute systolic CHF (congestive heart failure): Admitted with exertional dyspnea, significantly compensated after getting furosemide. 2-D echocardiogram demonstrates significantly reduced ejection fraction of around 15-20%.Plans are for right and left cardiac catheterization on 2/16. Currently low doses of lisinopril, metoprolol and Lasix. -11/17/2014 heart catheterization--normal coronaries  -Per cardiology will need to go home on life vest.  -Patient will go home with furosemide 40 mg daily, metoprolol succinate 12.5 mg daily, lisinopril 5 mg daily -She has follow-up with cardiology, Dr. Meda Coffee 11/20/2014, 3:45 PM -Patient is not to start any exercise program until cleared by cardiology--this was explained to the patient which she understood  Active Problems:  Pulmonary hypertension: Probably secondary to severe systolic dysfunction. Defer further workup to cardiology   URI: Complains of some nasal and sinus congestion. Continue Flonase and loratadine and as needed Afrin. Influenza PCR and strep throat negative.   ADD/anxiety disorder: Stable, continue with Adderall and Xanax.  -Patient has not taken Adderall since admission to the hospital -Per cardiology recommendations, she should not restart Adderall at this time -Follow up with primary care provider to determine alternatives for her ADD   Disposition Plan:   Home 2/17      Procedures/Studies: Dg Chest 2 View  11/13/2014   CLINICAL DATA:  Chest pain and pressure since this morning. Shortness of breath with ambulation. Cough last week. Low-grade fever.  EXAM: CHEST  2 VIEW  COMPARISON:  Rib series 4 11/21/2013  FINDINGS: Heart enlarged. There is prominence of interstitial markings. Kerley B-lines are present consistent with mild edema. There are no focal consolidations. No pleural  effusions. Visualized osseous structures have a normal appearance.  IMPRESSION: Cardiomegaly and mild interstitial edema.   Electronically Signed   By: Nolon Nations M.D.   On: 11/13/2014 19:11   Ct Angio Chest Pe W/cm &/or Wo Cm  11/13/2014   CLINICAL DATA:  Shortness of breath. Chest discomfort. Clinical suspicion for pulmonary embolism.  EXAM: CT ANGIOGRAPHY CHEST WITH CONTRAST  TECHNIQUE: Multidetector CT imaging of the chest was performed using the standard protocol during bolus administration of intravenous contrast. Multiplanar CT image reconstructions and MIPs were obtained to evaluate the vascular anatomy.  CONTRAST:  147mL OMNIPAQUE IOHEXOL 350 MG/ML SOLN  COMPARISON:  None.  FINDINGS: Mediastinum/Lymph Nodes: Satisfactory opacification of pulmonary arteries is noted, and no pulmonary embolism identified. Moderate cardiomegaly noted. No masses or pathologically enlarged lymph nodes identified.  Lungs/Pleura: Tiny pleural effusions present bilaterally. Diffuse thickening of pulmonary interstitial markings is seen which is most pronounced at the lung bases and most likely due to mild interstitial edema. No evidence of pulmonary consolidation or mass.  Musculoskeletal/Soft Tissues: No suspicious bone lesions or other significant chest wall abnormality.  Upper Abdomen:  Unremarkable.  Review of the MIP images confirms the above findings.  IMPRESSION: No evidence of pulmonary embolism.  Cardiomegaly, diffuse interstitial edema pattern, and tiny bilateral pleural effusions, consistent with mild congestive heart failure.   Electronically Signed   By: Earle Gell M.D.   On: 11/13/2014 20:39        Subjective: Patient denies fevers, chills, headache, chest pain, dyspnea, nausea, vomiting, diarrhea, abdominal pain, dysuria, hematuria   Objective: Filed Vitals:   11/18/14 0104 11/18/14 0523 11/18/14 0729 11/18/14 0938  BP: 98/54 105/64  103/50  Pulse: 64 76  77  Temp: 97.9 F (36.6 C) 98.2  F  (36.8 C)    TempSrc: Oral Oral    Resp: 18 18    Height:      Weight:   88.2 kg (194 lb 7.1 oz)   SpO2: 98% 98%      Intake/Output Summary (Last 24 hours) at 11/18/14 1422 Last data filed at 11/18/14 1401  Gross per 24 hour  Intake 2086.25 ml  Output   2000 ml  Net  86.25 ml   Weight change:  Exam:   General:  Pt is alert, follows commands appropriately, not in acute distress  HEENT: No icterus, No thrush, [place]/AT  Cardiovascular: RRR, S1/S2, no rubs, no gallops  Respiratory: CTA bilaterally, no wheezing, no crackles, no rhonchi  Abdomen: Soft/+BS, non tender, non distended, no guarding  Extremities: No edema, No lymphangitis, No petechiae, No rashes, no synovitis  Data Reviewed: Basic Metabolic Panel:  Recent Labs Lab 11/14/14 0720 11/15/14 0506 11/16/14 0410 11/17/14 0256 11/18/14 0404  NA 140 139 140 140 137  K 3.8 4.1 3.7 3.6 3.6  CL 107 107 103 106 107  CO2 26 24 27 21 26   GLUCOSE 101* 94 94 92 96  BUN 12 15 17 21 16   CREATININE 0.98 0.94 0.99 0.99 0.93  CALCIUM 8.8 9.1 9.0 9.2 8.9   Liver Function Tests:  Recent Labs Lab 11/14/14 0720  AST 30  ALT 31  ALKPHOS 57  BILITOT 1.2  PROT 6.5  ALBUMIN 3.7   No results for input(s): LIPASE, AMYLASE in the last 168 hours. No results for input(s): AMMONIA in the last 168 hours. CBC:  Recent Labs Lab 11/13/14 1805 11/14/14 0720 11/18/14 0404  WBC 7.0 5.0 6.5  NEUTROABS 4.5 3.0  --   HGB 11.6* 10.9* 12.4  HCT 35.5* 33.2* 37.6  MCV 92.7 91.2 91.0  PLT 235 241 246   Cardiac Enzymes:  Recent Labs Lab 11/13/14 1805 11/14/14 0055 11/14/14 0720 11/14/14 1130  TROPONINI <0.03 <0.03 <0.03 <0.03   BNP: Invalid input(s): POCBNP CBG: No results for input(s): GLUCAP in the last 168 hours.  Recent Results (from the past 240 hour(s))  Rapid strep screen     Status: None   Collection Time: 11/14/14  2:28 AM  Result Value Ref Range Status   Streptococcus, Group A Screen (Direct) NEGATIVE  NEGATIVE Final    Comment: (NOTE) A Rapid Antigen test may result negative if the antigen level in the sample is below the detection level of this test. The FDA has not cleared this test as a stand-alone test therefore the rapid antigen negative result has reflexed to a Group A Strep culture.   Culture, Group A Strep     Status: None   Collection Time: 11/14/14  2:28 AM  Result Value Ref Range Status   Specimen Description THROAT  Final   Special Requests NONE  Final   Culture   Final    No Beta Hemolytic Streptococci Isolated Performed at [facility]    Report Status 11/16/2014 FINAL  Final     Scheduled Meds: . amphetamine-dextroamphetamine  10 mg Oral Q1200  . aspirin EC  81 mg Oral Daily  . dextromethorphan-guaiFENesin  1 tablet Oral BID  . fluticasone  1 spray Each Nare Daily  . furosemide  40 mg Oral Daily  . heparin  5,000 Units Subcutaneous 3 times per day  . lisdexamfetamine  40 mg Oral Daily  . lisinopril  5 mg Oral Daily  . loratadine  10 mg Oral Daily  .  metoprolol succinate  12.5 mg Oral Daily  . montelukast  10 mg Oral QHS  . sodium chloride  3 mL Intravenous Q12H   Continuous Infusions: . sodium chloride 25 mL/hr at 11/17/14 2215     Kaya Klausing, DO  [facility] Pager [phone]  If 7PM-7AM, please contact night-coverage [email or web address] Password [email or web address] 11/18/2014, 2:22 PM   LOS: 5 days

## 2014-11-19 DIAGNOSIS — R55 Syncope and collapse: Secondary | ICD-10-CM

## 2014-11-19 DIAGNOSIS — R208 Other disturbances of skin sensation: Secondary | ICD-10-CM

## 2014-11-19 LAB — CBC
HEMATOCRIT: 37.9 % (ref 36.0–46.0)
HEMOGLOBIN: 12.3 g/dL (ref 12.0–15.0)
MCH: 30.1 pg (ref 26.0–34.0)
MCHC: 32.5 g/dL (ref 30.0–36.0)
MCV: 92.9 fL (ref 78.0–100.0)
Platelets: 250 10*3/uL (ref 150–400)
RBC: 4.08 MIL/uL (ref 3.87–5.11)
RDW: 13.3 % (ref 11.5–15.5)
WBC: 5 10*3/uL (ref 4.0–10.5)

## 2014-11-19 LAB — BASIC METABOLIC PANEL
ANION GAP: 7 (ref 5–15)
BUN: 21 mg/dL (ref 6–23)
CALCIUM: 9.3 mg/dL (ref 8.4–10.5)
CHLORIDE: 107 mmol/L (ref 96–112)
CO2: 25 mmol/L (ref 19–32)
Creatinine, Ser: 0.93 mg/dL (ref 0.50–1.10)
GFR calc non Af Amer: 67 mL/min — ABNORMAL LOW (ref >90)
GFR, EST AFRICAN AMERICAN: 78 mL/min — AB (ref >90)
Glucose, Bld: 86 mg/dL (ref 70–99)
POTASSIUM: 3.9 mmol/L (ref 3.5–5.1)
Sodium: 139 mmol/L (ref 135–145)

## 2014-11-19 LAB — TROPONIN I: Troponin I: <0.03 ng/mL (ref <0.031)

## 2014-11-19 MED ORDER — METOPROLOL TARTRATE 25 MG PO TABS: 12.5000 mg | ORAL_TABLET | Freq: Two times a day (BID) | ORAL | Status: DC

## 2014-11-19 MED ORDER — FUROSEMIDE 20 MG PO TABS: 20.0000 mg | ORAL_TABLET | Freq: Every day | ORAL | Status: DC

## 2014-11-19 MED ORDER — LISINOPRIL 2.5 MG PO TABS: 2.5000 mg | ORAL_TABLET | Freq: Every day | ORAL | Status: DC

## 2014-11-19 NOTE — Progress Notes (Signed)
Patient refuse to take discharge orders because she says the Dr.was changing her metoproiol 6.25 . Called MD for clarification of  Medication. Order.Tried to explain the order but patent seems to no understanding what I am trying to explained on medication order.

## 2014-11-19 NOTE — Progress Notes (Signed)
Patient home with life vest on

## 2014-11-19 NOTE — Progress Notes (Signed)
Patient Name: Archana Eckman Date of Encounter: 11/19/2014   Principal Problem:   Acute systolic congestive heart failure Active Problems:   ADD (attention deficit disorder)   Hot flashes, menopausal   LBBB (left bundle branch block)   Dyslipidemia   Chest tightness   Idiopathic cardiomyopathy   Congestive heart disease   Cardiomegaly   SOB (shortness of breath)   NICM (nonischemic cardiomyopathy)   Nonischemic dilated cardiomyopathy   Near syncope   Sensory disturbance   SUBJECTIVE  The patient was supposed to be discharged yesterday when she developed presyncopal episode with dizziness, blurry vision, and bilateral upper extremity numbness. Feeling well this am.    CURRENT MEDS . aspirin EC  81 mg Oral Daily  . dextromethorphan-guaiFENesin  1 tablet Oral BID  . fluticasone  1 spray Each Nare Daily  . furosemide  40 mg Oral Daily  . heparin  5,000 Units Subcutaneous 3 times per day  . lisinopril  2.5 mg Oral Daily  . loratadine  10 mg Oral Daily  . metoprolol succinate  12.5 mg Oral Daily  . montelukast  10 mg Oral QHS  . sodium chloride  3 mL Intravenous Q12H   OBJECTIVE  Filed Vitals:   11/18/14 2217 11/18/14 2220 11/19/14 0500 11/19/14 0900  BP: 85/51 90/51 96/54  104/63  Pulse: 59  59 68  Temp: 98.1 F (36.7 C)  98.4 F (36.9 C) 98.1 F (36.7 C)  TempSrc: Oral  Oral Oral  Resp: 18  19 20   Height:      Weight:   194 lb 0.1 oz (88 kg)   SpO2: 95%  98% 99%    Intake/Output Summary (Last 24 hours) at 11/19/14 1136 Last data filed at 11/19/14 0843  Gross per 24 hour  Intake    880 ml  Output    300 ml  Net    580 ml   Filed Weights   11/17/14 0540 11/18/14 0729 11/19/14 0500  Weight: 194 lb 14.2 oz (88.4 kg) 194 lb 7.1 oz (88.2 kg) 194 lb 0.1 oz (88 kg)    PHYSICAL EXAM  General: Pleasant, NAD. Neuro: Alert and oriented X 3. Moves all extremities spontaneously. Psych: Normal affect. HEENT:  Normal  Neck: Supple without bruits or JVD. Lungs:   Resp regular and unlabored, CTA. Heart: RRR no s3, s4, or murmurs. Abdomen: Soft, non-tender, non-distended, BS + x 4.  Extremities: No clubbing, cyanosis or edema. DP/PT/Radials 2+ and equal bilaterally.  Accessory Clinical Findings  Basic Metabolic Panel  Recent Labs  11/18/14 0404 11/19/14 0513  NA 137 139  K 3.6 3.9  CL 107 107  CO2 26 25  GLUCOSE 96 86  BUN 16 21  CREATININE 0.93 0.93  CALCIUM 8.9 9.3   Liver Function Tests No results for input(s): AST, ALT, ALKPHOS, BILITOT, PROT, ALBUMIN in the last 72 hours. Cardiac Enzymes  Recent Labs  11/18/14 1700 11/18/14 2103 11/19/14 0513  TROPONINI <0.03 <0.03 <0.03   TELE Sinus brady with HR 60-70   ECG  No new EKG  Echocardiogram 11/15/2014  - Left ventricle: EF 15-20% high sphericity index no obvious mural apical thrombus. The cavity size was severely dilated. Wall thickness was normal. - Mitral valve: There was mild to moderate regurgitation. - Left atrium: The atrium was mildly dilated. - Right atrium: The atrium was mildly dilated. - Atrial septum: No defect or patent foramen ovale was identified. - Tricuspid valve: There was mild-moderate regurgitation. - Pulmonary arteries: PA peak pressure:  65 mm Hg (S).     Radiology/Studies  Dg Chest 2 View  11/13/2014   CLINICAL DATA:  Chest pain and pressure since this morning. Shortness of breath with ambulation. Cough last week. Low-grade fever.  EXAM: CHEST  2 VIEW  COMPARISON:  Rib series 4 11/21/2013  FINDINGS: Heart enlarged. There is prominence of interstitial markings. Kerley B-lines are present consistent with mild edema. There are no focal consolidations. No pleural effusions. Visualized osseous structures have a normal appearance.  IMPRESSION: Cardiomegaly and mild interstitial edema.   Electronically Signed   By: Nolon Nations M.D.   On: 11/13/2014 19:11   Ct Angio Chest Pe W/cm &/or Wo Cm  11/13/2014   CLINICAL DATA:  Shortness of breath.  Chest discomfort. IMPRESSION: No evidence of pulmonary embolism.  Cardiomegaly, diffuse interstitial edema pattern, and tiny bilateral pleural effusions, consistent with mild congestive heart failure.   Electronically Signed   By: Earle Gell M.D.   On: 11/13/2014 20:39    ASSESSMENT AND PLAN  1. Acute heart failure: newly diagnosed  - reported previous cath 10 yrs ago at [facility] in [place] negative for CAD. Chronic LBBB  - normal cath today consistent with non-ischemic dilated CMP  - continue the same dose of lasix, she appears euvolemic, Crea is stable   - on Adderall for many years - we will discuss possible weaning or decreasing dosage.   - had presyncope while not on telemetry, most probably sec to hypotension, I agree with decreasing lisinopril to 2.5 mg po daily, I would further decrease lasix to 20 mg po daily and metoprolol to 6.25 mg po BID.   2. non-ischemic dilated CMP, LVEF 15%, severely dilated LV  - continue BB and ACEI, unable to uptitrate as low BP  - Continue current dose of lasix. Unable to uptitrate due to hypotension. We will try to add spironolactone at the outpatient visit  - obtained Lifevest  - can be discharged today with follow up in the clinic within 2 weeks and repeat echo in 3 months  3. ADD as above  She can be discharged, we will follow  Dorothy Spark 11/19/2014

## 2014-11-19 NOTE — Progress Notes (Addendum)
PROGRESS NOTE  Debra Holloway YDX:[id number] DOB: 20-Apr-1956 DOA: 11/13/2014 PCP: Leeanne Rio, PA-C  Assessment/Plan: Acute systolic CHF/NICM: Admitted with exertional dyspnea, significantly compensated after getting furosemide. 2-D echocardiogram demonstrates significantly reduced ejection fraction of around 15-20%.Plans are for right and left cardiac catheterization on 2/16.  -Currently low doses of lisinopril, metoprolol and Lasix. -11/17/2014 heart catheterization--normal coronaries  -Per cardiology will need to go home on life vest.  -Patient will go home with furosemide 40 mg daily, metoprolol succinate 12.5 mg daily, lisinopril 5 mg daily -She has follow-up with cardiology, Dr. Meda Coffee 11/20/2014, 3:45 PM -Patient is not to start any exercise program until cleared by cardiology--this was explained to the patient which she understood  -11/18/2014--patient developed presyncopal episode with dizziness, blurry vision, and bilateral upper extremity numbness--likely related to the patient's cardiomyopathy and soft blood pressures -Repeat troponins negative -Repeat EKG unchanged left bundle branch block, unchanged axis -Patient seen by neurology who did not feel that the patient had an acute neurologic issue and did not recommend any further workup at this time -CT brain neg -Plan to discharge 11/19/2014 after seen by cardiology-->DECREASED MEDS TO LASIX 20MG  QDAY, METOPROLOL TARTRATE 6.25MG  BID, LISINOPRIL 2.5MG  DAILY -At the time of d/c explained to the pt that the smallest fixed dose tab for metoprolol is 25mg  and she will have to cut the tab into 1/4 for 6.25mg .  She express understanding -Patient appears euvolemic clinically  Pulmonary hypertension: Probably secondary to severe systolic dysfunction. Defer further workup to cardiology   URI: Complains of some nasal and sinus congestion. Continue Flonase and loratadine and as needed Afrin. Influenza PCR and strep  throat negative.   ADD/anxiety disorder: Stable, continue with Adderall and Xanax.  -Patient has not taken Adderall since admission to the hospital -Per cardiology recommendations, she should not restart Adderall at this time -Follow up with primary care provider to determine alternatives for her ADD           Procedures/Studies: Dg Chest 2 View  11/13/2014   CLINICAL DATA:  Chest pain and pressure since this morning. Shortness of breath with ambulation. Cough last week. Low-grade fever.  EXAM: CHEST  2 VIEW  COMPARISON:  Rib series 4 11/21/2013  FINDINGS: Heart enlarged. There is prominence of interstitial markings. Kerley B-lines are present consistent with mild edema. There are no focal consolidations. No pleural effusions. Visualized osseous structures have a normal appearance.  IMPRESSION: Cardiomegaly and mild interstitial edema.   Electronically Signed   By: Nolon Nations M.D.   On: 11/13/2014 19:11   Ct Head Wo Contrast  11/18/2014   CLINICAL DATA:  Dizziness and syncope with nausea. Numbness and tingling of both hands, transient  EXAM: CT HEAD WITHOUT CONTRAST  TECHNIQUE: Contiguous axial images were obtained from the base of the skull through the vertex without intravenous contrast.  COMPARISON:  None.  FINDINGS: The ventricles are normal in size and configuration. There is no mass, hemorrhage, extra-axial fluid collection, or midline shift. The gray-white compartments appear normal. No acute infarct apparent. The bony calvarium appears intact. The mastoid air cells clear.  IMPRESSION: Study within normal limits. No intracranial mass, hemorrhage, or focal gray -white compartment lesions/acute appearing infarct.   Electronically Signed   By: Lowella Grip III M.D.   On: 11/18/2014 16:28   Ct Angio Chest Pe W/cm &/or Wo Cm  11/13/2014   CLINICAL DATA:  Shortness of breath. Chest discomfort. Clinical suspicion for pulmonary embolism.  EXAM: CT ANGIOGRAPHY CHEST WITH CONTRAST   TECHNIQUE: Multidetector CT imaging of the chest was performed using the standard protocol during bolus administration of intravenous contrast. Multiplanar CT image reconstructions and MIPs were obtained to evaluate the vascular anatomy.  CONTRAST:  148mL OMNIPAQUE IOHEXOL 350 MG/ML SOLN  COMPARISON:  None.  FINDINGS: Mediastinum/Lymph Nodes: Satisfactory opacification of pulmonary arteries is noted, and no pulmonary embolism identified. Moderate cardiomegaly noted. No masses or pathologically enlarged lymph nodes identified.  Lungs/Pleura: Tiny pleural effusions present bilaterally. Diffuse thickening of pulmonary interstitial markings is seen which is most pronounced at the lung bases and most likely due to mild interstitial edema. No evidence of pulmonary consolidation or mass.  Musculoskeletal/Soft Tissues: No suspicious bone lesions or other significant chest wall abnormality.  Upper Abdomen:  Unremarkable.  Review of the MIP images confirms the above findings.  IMPRESSION: No evidence of pulmonary embolism.  Cardiomegaly, diffuse interstitial edema pattern, and tiny bilateral pleural effusions, consistent with mild congestive heart failure.   Electronically Signed   By: Earle Gell M.D.   On: 11/13/2014 20:39         Subjective: No significant events overnight. No further presyncopal episodes. Denies any chest discomfort, shortness breath, dizziness, nausea, dysuria.  Objective: Filed Vitals:   11/18/14 2217 11/18/14 2220 11/19/14 0500 11/19/14 0900  BP: 85/51 90/51 96/54  104/63  Pulse: 59  59 68  Temp: 98.1 F (36.7 C)  98.4 F (36.9 C) 98.1 F (36.7 C)  TempSrc: Oral  Oral Oral  Resp: 18  19 20   Height:      Weight:   88 kg (194 lb 0.1 oz)   SpO2: 95%  98% 99%    Intake/Output Summary (Last 24 hours) at 11/19/14 0927 Last data filed at 11/19/14 0843  Gross per 24 hour  Intake 1482.08 ml  Output    300 ml  Net 1182.08 ml   Weight change:  Exam:   General:  Pt is alert,  follows commands appropriately, not in acute distress  HEENT: No icterus, No thrush, No neck mass, [place]/AT  Cardiovascular: RRR, S1/S2, no rubs, no gallops  Respiratory: CTA bilaterally, no wheezing, no crackles, no rhonchi  Abdomen: Soft/+BS, non tender, non distended, no guarding  Extremities: No edema, No lymphangitis, No petechiae, No rashes, no synovitis  Data Reviewed: Basic Metabolic Panel:  Recent Labs Lab 11/15/14 0506 11/16/14 0410 11/17/14 0256 11/18/14 0404 11/19/14 0513  NA 139 140 140 137 139  K 4.1 3.7 3.6 3.6 3.9  CL 107 103 106 107 107  CO2 24 27 21 26 25   GLUCOSE 94 94 92 96 86  BUN 15 17 21 16 21   CREATININE 0.94 0.99 0.99 0.93 0.93  CALCIUM 9.1 9.0 9.2 8.9 9.3   Liver Function Tests:  Recent Labs Lab 11/14/14 0720  AST 30  ALT 31  ALKPHOS 57  BILITOT 1.2  PROT 6.5  ALBUMIN 3.7   No results for input(s): LIPASE, AMYLASE in the last 168 hours. No results for input(s): AMMONIA in the last 168 hours. CBC:  Recent Labs Lab 11/13/14 1805 11/14/14 0720 11/18/14 0404 11/19/14 0513  WBC 7.0 5.0 6.5 5.0  NEUTROABS 4.5 3.0  --   --   HGB 11.6* 10.9* 12.4 12.3  HCT 35.5* 33.2* 37.6 37.9  MCV 92.7 91.2 91.0 92.9  PLT 235 241 246 250   Cardiac Enzymes:  Recent Labs Lab 11/14/14 0720 11/14/14 1130 11/18/14 1700 11/18/14 2103 11/19/14 0513  TROPONINI <0.03 <0.03 <0.03 <0.03 <  0.03   BNP: Invalid input(s): POCBNP CBG: No results for input(s): GLUCAP in the last 168 hours.  Recent Results (from the past 240 hour(s))  Rapid strep screen     Status: None   Collection Time: 11/14/14  2:28 AM  Result Value Ref Range Status   Streptococcus, Group A Screen (Direct) NEGATIVE NEGATIVE Final    Comment: (NOTE) A Rapid Antigen test may result negative if the antigen level in the sample is below the detection level of this test. The FDA has not cleared this test as a stand-alone test therefore the rapid antigen negative result has reflexed to a  Group A Strep culture.   Culture, Group A Strep     Status: None   Collection Time: 11/14/14  2:28 AM  Result Value Ref Range Status   Specimen Description THROAT  Final   Special Requests NONE  Final   Culture   Final    No Beta Hemolytic Streptococci Isolated Performed at [facility]    Report Status 11/16/2014 FINAL  Final     Scheduled Meds: . aspirin EC  81 mg Oral Daily  . dextromethorphan-guaiFENesin  1 tablet Oral BID  . fluticasone  1 spray Each Nare Daily  . furosemide  40 mg Oral Daily  . heparin  5,000 Units Subcutaneous 3 times per day  . lisinopril  2.5 mg Oral Daily  . loratadine  10 mg Oral Daily  . metoprolol succinate  12.5 mg Oral Daily  . montelukast  10 mg Oral QHS  . sodium chloride  3 mL Intravenous Q12H   Continuous Infusions: . sodium chloride 25 mL/hr at 11/17/14 2215     Nakeitha Milligan, DO  [facility] Pager [phone]  If 7PM-7AM, please contact night-coverage [email or web address] Password [facility] 11/19/2014, 9:27 AM   LOS: 6 days

## 2014-11-19 NOTE — Progress Notes (Signed)
Arrive at bedside at 3:08 to explain to pt metoprolol dosing again.  -At the time of d/c explained to the pt that the smallest fixed dose tab for metoprolol is 25mg  and she will have to cut the tab into 1/4 for 6.25mg  and take 6.25mg  twice a day. She express understanding  DTat

## 2014-11-20 ENCOUNTER — Ambulatory Visit: Payer: 59 | Admitting: Cardiology

## 2014-11-23 ENCOUNTER — Encounter: Payer: Self-pay | Admitting: Cardiology

## 2014-11-23 ENCOUNTER — Telehealth: Payer: Self-pay | Admitting: Cardiology

## 2014-11-23 ENCOUNTER — Encounter: Payer: 59 | Admitting: Nurse Practitioner

## 2014-11-23 NOTE — Telephone Encounter (Signed)
error 

## 2014-11-24 ENCOUNTER — Encounter: Payer: Self-pay | Admitting: Cardiology

## 2014-11-24 ENCOUNTER — Ambulatory Visit (INDEPENDENT_AMBULATORY_CARE_PROVIDER_SITE_OTHER): Payer: 59 | Admitting: Cardiology

## 2014-11-24 VITALS — BP 100/60 | HR 65 | Ht 66.0 in | Wt 198.0 lb

## 2014-11-24 DIAGNOSIS — I429 Cardiomyopathy, unspecified: Secondary | ICD-10-CM

## 2014-11-24 DIAGNOSIS — I420 Dilated cardiomyopathy: Secondary | ICD-10-CM

## 2014-11-24 DIAGNOSIS — I272 Pulmonary hypertension, unspecified: Secondary | ICD-10-CM

## 2014-11-24 DIAGNOSIS — I5021 Acute systolic (congestive) heart failure: Secondary | ICD-10-CM | POA: Insufficient documentation

## 2014-11-24 DIAGNOSIS — I270 Primary pulmonary hypertension: Secondary | ICD-10-CM

## 2014-11-24 DIAGNOSIS — I517 Cardiomegaly: Secondary | ICD-10-CM

## 2014-11-24 MED ORDER — IVABRADINE HCL 5 MG PO TABS
2.5000 mg | ORAL_TABLET | Freq: Two times a day (BID) | ORAL | Status: DC
Start: 2014-11-24 — End: 2014-11-24

## 2014-11-24 MED ORDER — IVABRADINE HCL 5 MG PO TABS: 5.0000 mg | ORAL_TABLET | Freq: Two times a day (BID) | ORAL | Status: DC

## 2014-11-24 NOTE — Patient Instructions (Addendum)
Your physician has recommended you make the following change in your medication:   STOP TAKING METOPROLOL NOW  START TAKING CORLANOR 5 MG TWICE DAILY WITH A MEAL     You have been referred to [place] YOU TO SET UP YOUR NEW PATIENT ORIENTATION     Your physician recommends that you schedule a follow-up appointment in: [place] DR NELSON--OK TO DOUBLE BOOK PER DR Meda Coffee

## 2014-11-24 NOTE — Addendum Note (Signed)
Addended by: Nuala Alpha on: 11/24/2014 03:11 PM   Modules accepted: Orders, Medications

## 2014-11-24 NOTE — Progress Notes (Signed)
Patient ID: Debra Holloway, female   DOB: January 26, 1956, 59 y.o.   MRN: [id number]     Patient Name: Debra Holloway Date of Encounter: 11/24/2014  Primary Care Provider:  Leeanne Rio, PA-C Primary Cardiologist:  Dorothy Spark   Problem List   Past Medical History  Diagnosis Date  . ADD (attention deficit disorder)   . Environmental allergies   . Chicken pox   . H/O vitamin D deficiency   . Seasonal affective disorder   . LBBB (left bundle branch block)     W/1st degree AV Block, Avoid Beta Blockers  . AR (allergic rhinitis)   . Depression   . Dyslipidemia    Past Surgical History  Procedure Laterality Date  . Dilation and curettage of uterus  2003  . Abdominal hysterectomy  2004  . Wisdom tooth extraction  1986  . Left and right heart catheterization with coronary angiogram N/A 11/17/2014    Procedure: LEFT AND RIGHT HEART CATHETERIZATION WITH CORONARY ANGIOGRAM;  Surgeon: Troy Sine, MD;  Location: [facility] CATH LAB;  Service: Cardiovascular;  Laterality: N/A;    Allergies  Allergies  Allergen Reactions  . Bee Venom Anaphylaxis  . Erythromycin Itching and Rash  . Penicillins Hives, Itching and Rash    HPI  59 year old female admitted to the hospital on 11/14/2014 with exertional dyspnea, significantly improved after getting furosemide. 2-D echocardiogram demonstrates significantly reduced ejection fraction of around 15-20%.Underwent LHC which showed normal coronaries. Currently tolerating low doses of lisinopril, metoprolol and Lasix. While in the hospital she was experiencing significant hypotension and dosages of her meds had to be decreased significantly to the lowest possible dosages. Before discharge and the hospital she experienced syncopal episode while being off telemetry and before she obtain LifeVest.  She is coming today after week she states that she has gained 3 pounds since the discharge, however based on home scale just 3 pounds at home 192 pounds  and here 195 pounds. She has two-pillow orthopnea and no paroxysmal nocturnal dyspnea. She feels dizzy all the time and in fact his orthostatic today in our clinic. She denies any further syncopal episodes but has significant presyncopal episodes.   Home Medications  Prior to Admission medications   Medication Sig Start Date End Date Taking? Authorizing Provider  fexofenadine-pseudoephedrine (ALLEGRA-D 24) 180-240 MG per 24 hr tablet Take 1 tablet by mouth daily.   Yes Historical Provider, MD  fluticasone (FLONASE) 50 MCG/ACT nasal spray Place 1 spray into both nostrils daily.   Yes Historical Provider, MD  furosemide (LASIX) 20 MG tablet Take 1 tablet (20 mg total) by mouth daily. 11/19/14  Yes Orson Eva, MD  lisinopril (PRINIVIL,ZESTRIL) 2.5 MG tablet Take 1 tablet (2.5 mg total) by mouth daily. 11/19/14  Yes Orson Eva, MD  montelukast (SINGULAIR) 10 MG tablet Take 1 tablet (10 mg total) by mouth at bedtime. 01/21/14  Yes Brunetta Jeans, PA-C  Vitamin D, Ergocalciferol, (DRISDOL) 50000 UNITS CAPS capsule Take 1 capsule (50,000 Units total) by mouth every 7 (seven) days. Patient taking differently: Take 50,000 Units by mouth every 7 (seven) days. No specific day 09/07/14  Yes Brunetta Jeans, PA-C    Family History  Family History  Problem Relation Age of Onset  . COPD Mother     Living  . Pancreatic cancer Father 69    Deceased  . Lung cancer Paternal Grandfather   . Heart disease Paternal Grandmother   . Hypertension Paternal Grandmother   . Alzheimer's disease Maternal  Grandfather   . Arthritis/Rheumatoid Maternal Grandmother   . Heart disease Maternal Grandmother     Tachycardia  . Cancer Paternal Uncle   . Breast cancer Sister 81  . Healthy Brother     x3  . Healthy Daughter     x3    Social History  History   Social History  . Marital Status: Legally Separated    Spouse Name: N/A  . Number of Children: N/A  . Years of Education: N/A   Occupational History  . Not  on file.   Social History Main Topics  . Smoking status: Never Smoker   . Smokeless tobacco: Never Used  . Alcohol Use: 2.4 oz/week    4 Glasses of wine per week  . Drug Use: No  . Sexual Activity: Not on file   Other Topics Concern  . Not on file   Social History Narrative     Review of Systems, as per HPI, otherwise negative General:  No chills, fever, night sweats or weight changes.  Cardiovascular:  No chest pain, dyspnea on exertion, edema, orthopnea, palpitations, paroxysmal nocturnal dyspnea. Dermatological: No rash, lesions/masses Respiratory: No cough, dyspnea Urologic: No hematuria, dysuria Abdominal:   No nausea, vomiting, diarrhea, bright red blood per rectum, melena, or hematemesis Neurologic:  No visual changes, wkns, changes in mental status. All other systems reviewed and are otherwise negative except as noted above.  Physical Exam  Blood pressure 100/60, pulse 65, height 5\' 6"  (1.676 m), weight 198 lb (89.812 kg).  General: Pleasant, NAD Psych: Normal affect. Neuro: Alert and oriented X 3. Moves all extremities spontaneously. HEENT: Normal  Neck: Supple without bruits, JVD 6 cm. Lungs:  Resp regular and unlabored, CTA. Heart: RRR no s3, s4, 2/6 systolic murmur. Abdomen: Soft, non-tender, non-distended, BS + x 4.  Extremities: No clubbing, cyanosis, trace LE edema. DP/PT/Radials 2+ and equal bilaterally.  Labs:  No results for input(s): CKTOTAL, CKMB, TROPONINI in the last 72 hours. Lab Results  Component Value Date   WBC 5.0 11/19/2014   HGB 12.3 11/19/2014   HCT 37.9 11/19/2014   MCV 92.9 11/19/2014   PLT 250 11/19/2014    Lab Results  Component Value Date   DDIMER 0.61* 11/13/2014   Invalid input(s): POCBNP    Component Value Date/Time   NA 139 11/19/2014 0513   K 3.9 11/19/2014 0513   CL 107 11/19/2014 0513   CO2 25 11/19/2014 0513   GLUCOSE 86 11/19/2014 0513   BUN 21 11/19/2014 0513   CREATININE 0.93 11/19/2014 0513   CALCIUM 9.3  11/19/2014 0513   PROT 6.5 11/14/2014 0720   ALBUMIN 3.7 11/14/2014 0720   AST 30 11/14/2014 0720   ALT 31 11/14/2014 0720   ALKPHOS 57 11/14/2014 0720   BILITOT 1.2 11/14/2014 0720   GFRNONAA 67* 11/19/2014 0513   GFRAA 78* 11/19/2014 0513   Lab Results  Component Value Date   CHOL 225* 09/07/2014   HDL 69.60 09/07/2014   LDLCALC 136* 09/07/2014   TRIG 95.0 09/07/2014    Accessory Clinical Findings  Echocardiogram - 11/15/2014 Left ventricle: EF 15-20% high sphericity index no obvious mural apical thrombus. The cavity size was severely dilated. Wall thickness was normal. - Mitral valve: There was mild to moderate regurgitation. - Left atrium: The atrium was mildly dilated. - Right atrium: The atrium was mildly dilated. - Atrial septum: No defect or patent foramen ovale was identified. - Tricuspid valve: There was mild-moderate regurgitation. - Pulmonary arteries: PA peak pressure:  65 mm Hg (S).  ECG - sinus rhythm, PVCs, left bundle branch block.    Assessment & Plan  1. Acute heart failure: newly diagnosed, NYHA III - The patient has gained at least 3 pounds since the last visit, however she is orthostatic and hypotensive today, blood pressure 100/60 with just sitting up her blood pressure dropped to 92/56 and she was significantly dizzy and nauseated.  - We will discontinue metoprolol and start Corlanor 2.5 mg twice a dayas she is not able to tolerate metoprolol and has presyncopal episodes on daily basis preventing her from doing any physical activity.  - We will refer patient to cardiac rehabilitation.  - For now we will continue the same dose of Lasix 20 mg daily and follow her in one week to check on her weight, symptoms -continue lisinopril 2.5 mg po daily  - There unable to add spironolactone as her blood pressures were low.  2. non-ischemic dilated CMP, LVEF 15%, severely dilated LV - Management as about with Ace  inhibitor, no beta blocker - repeat echo in 3 months  - The patient is inquiring about her possible prognosis, advanced heart failure therapies these are explained in case she doesn't improve in the next 6 months or if her condition deteriorates.  3.Pulmonary hypertension - most probably related to left-sided heart failure, we will monitor on an echocardiogram in 3 months.  Follow up in 1 week.  Dorothy Spark, MD, [facility] 11/24/2014, 2:37 PM

## 2014-11-26 ENCOUNTER — Telehealth: Payer: Self-pay | Admitting: *Deleted

## 2014-11-26 NOTE — Telephone Encounter (Signed)
Contacted [employer] thru pts pharmacy [facility] to initiate prior auth on Corlanor 5 mg po twice daily with meals.  Spoke with Talbert Nan to initiate the authorization.  Per [employer], they will send our office a fax at [phone] to inform if the pt is approved or denied coverage for this medication within 48-72 hours.

## 2014-11-29 ENCOUNTER — Telehealth: Payer: Self-pay | Admitting: Physician Assistant

## 2014-11-29 NOTE — Telephone Encounter (Signed)
Ms. Debra Holloway was recently placed on Corlanao after she had problems with beta blockers causing hypotension, even at very low doses. This medicine was started at her office visit on 2/23.  She had an episode where she felt a little lightheaded and was having some strange feelings. She looked at herself in the mirror and her face is bright pink. She has also had a lightheaded feeling. Her blood pressure has been running higher than usual, with a systolic up to the 02O. She denies visual changes.  Discussed the situation with Dr. Haroldine Laws. The recommendation is to discontinue the Corlanor. She is to continue to monitor heart rate and blood pressure and document symptoms. She is to continue to track her weight. Her weight has gone up somewhat in the last 5 days, will have the office contact her and offer her a flex clinic appointment for next week.  Rosaria Ferries, PA-C 11/29/2014 2:47 PM Beeper [phone]

## 2014-11-30 ENCOUNTER — Encounter: Payer: Self-pay | Admitting: Cardiology

## 2014-11-30 ENCOUNTER — Ambulatory Visit (INDEPENDENT_AMBULATORY_CARE_PROVIDER_SITE_OTHER): Payer: 59 | Admitting: Cardiology

## 2014-11-30 VITALS — BP 98/62 | HR 62 | Ht 66.0 in | Wt 198.4 lb

## 2014-11-30 DIAGNOSIS — I959 Hypotension, unspecified: Secondary | ICD-10-CM

## 2014-11-30 DIAGNOSIS — I420 Dilated cardiomyopathy: Secondary | ICD-10-CM

## 2014-11-30 DIAGNOSIS — I5021 Acute systolic (congestive) heart failure: Secondary | ICD-10-CM

## 2014-11-30 MED ORDER — CARVEDILOL 3.125 MG PO TABS: ORAL_TABLET | ORAL | Status: DC

## 2014-11-30 NOTE — Progress Notes (Signed)
Patient ID: Debra Holloway, female   DOB: [birth year], 59 y.o.   MRN: [id number]     Patient Name: Debra Holloway Date of Encounter: 11/30/2014  Primary Care Provider:  Leeanne Rio, PA-C Primary Cardiologist:  Dorothy Spark   Problem List   Past Medical History  Diagnosis Date  . ADD (attention deficit disorder)   . Environmental allergies   . Chicken pox   . H/O vitamin D deficiency   . Seasonal affective disorder   . LBBB (left bundle branch block)     W/1st degree AV Block, Avoid Beta Blockers  . AR (allergic rhinitis)   . Depression   . Dyslipidemia    Past Surgical History  Procedure Laterality Date  . Dilation and curettage of uterus  2003  . Abdominal hysterectomy  2004  . Wisdom tooth extraction  1986  . Left and right heart catheterization with coronary angiogram N/A 11/17/2014    Procedure: LEFT AND RIGHT HEART CATHETERIZATION WITH CORONARY ANGIOGRAM;  Surgeon: Troy Sine, MD;  Location: [facility] CATH LAB;  Service: Cardiovascular;  Laterality: N/A;    Allergies  Allergies  Allergen Reactions  . Bee Venom Anaphylaxis  . Erythromycin Itching and Rash  . Penicillins Hives, Itching and Rash    HPI  59 year old female admitted to the hospital on 11/14/2014 with exertional dyspnea, significantly improved after getting furosemide. 2-D echocardiogram demonstrates significantly reduced ejection fraction of around 15-20%.Underwent LHC which showed normal coronaries. Currently tolerating low doses of lisinopril, metoprolol and Lasix. While in the hospital she was experiencing significant hypotension and dosages of her meds had to be decreased significantly to the lowest possible dosages. Before discharge and the hospital she experienced syncopal episode while being off telemetry and before she obtain LifeVest.  She is coming today after week she states that she has gained 3 pounds since the discharge, however based on home scale just 3 pounds at home 192 pounds  and here 195 pounds. She has two-pillow orthopnea and no paroxysmal nocturnal dyspnea. She feels dizzy all the time and in fact his orthostatic today in our clinic. She denies any further syncopal episodes but has significant presyncopal episodes.   11/30/2014 - this is one week follow-up, the last week patient presented with significant dizziness and presyncopal episodes and therefore her beta blocker was discontinued and the dose of lisinopril was decreased to 2.5 mg daily. The patient was started on Corlanor 5 mg twice a day. The patient felt slightly better with absolutely minimal activity that includes going to the bathroom, fixing herself meals and walking to the mailbox. This Saturday patient tried to do some housework including very short vacuuming that lead to symptoms of shortness of breath, nausea and presyncope. The patient also experienced palpitations and and had alarm from her LifeVest. She called the hospital and Dr. Haroldine Laws recommended to discontinue Corlanor.  Home Medications  Prior to Admission medications   Medication Sig Start Date End Date Taking? Authorizing Provider  fexofenadine-pseudoephedrine (ALLEGRA-D 24) 180-240 MG per 24 hr tablet Take 1 tablet by mouth daily.   Yes Historical Provider, MD  fluticasone (FLONASE) 50 MCG/ACT nasal spray Place 1 spray into both nostrils daily.   Yes Historical Provider, MD  furosemide (LASIX) 20 MG tablet Take 1 tablet (20 mg total) by mouth daily. 11/19/14  Yes Orson Eva, MD  lisinopril (PRINIVIL,ZESTRIL) 2.5 MG tablet Take 1 tablet (2.5 mg total) by mouth daily. 11/19/14  Yes Orson Eva, MD  montelukast (SINGULAIR) 10 MG tablet Take  1 tablet (10 mg total) by mouth at bedtime. 01/21/14  Yes Brunetta Jeans, PA-C  Vitamin D, Ergocalciferol, (DRISDOL) 50000 UNITS CAPS capsule Take 1 capsule (50,000 Units total) by mouth every 7 (seven) days. Patient taking differently: Take 50,000 Units by mouth every 7 (seven) days. No specific day 09/07/14   Yes Brunetta Jeans, PA-C    Family History  Family History  Problem Relation Age of Onset  . COPD Mother     Living  . Pancreatic cancer Father 76    Deceased  . Lung cancer Paternal Grandfather   . Heart disease Paternal Grandmother   . Hypertension Paternal Grandmother   . Alzheimer's disease Maternal Grandfather   . Arthritis/Rheumatoid Maternal Grandmother   . Heart disease Maternal Grandmother     Tachycardia  . Cancer Paternal Uncle   . Breast cancer Sister 51  . Healthy Brother     x3  . Healthy Daughter     x3    Social History  History   Social History  . Marital Status: Legally Separated    Spouse Name: N/A  . Number of Children: N/A  . Years of Education: N/A   Occupational History  . Not on file.   Social History Main Topics  . Smoking status: Never Smoker   . Smokeless tobacco: Never Used  . Alcohol Use: 2.4 oz/week    4 Glasses of wine per week  . Drug Use: No  . Sexual Activity: Not on file   Other Topics Concern  . Not on file   Social History Narrative     Review of Systems, as per HPI, otherwise negative General:  No chills, fever, night sweats or weight changes.  Cardiovascular:  No chest pain, dyspnea on exertion, edema, orthopnea, palpitations, paroxysmal nocturnal dyspnea. Dermatological: No rash, lesions/masses Respiratory: No cough, dyspnea Urologic: No hematuria, dysuria Abdominal:   No nausea, vomiting, diarrhea, bright red blood per rectum, melena, or hematemesis Neurologic:  No visual changes, wkns, changes in mental status. All other systems reviewed and are otherwise negative except as noted above.  Physical Exam  Blood pressure 98/62, pulse 62, height 5\' 6"  (1.676 m), weight 198 lb 6.4 oz (89.994 kg).  General: Pleasant, NAD Psych: Normal affect. Neuro: Alert and oriented X 3. Moves all extremities spontaneously. HEENT: Normal  Neck: Supple without bruits, JVD 6 cm. Lungs:  Resp regular and unlabored, CTA. Heart:  RRR no s3, s4, 3/6 systolic murmur. Abdomen: Soft, non-tender, non-distended, BS + x 4.  Extremities: No clubbing, cyanosis, trace LE edema. DP/PT/Radials 2+ and equal bilaterally.  Labs:  No results for input(s): CKTOTAL, CKMB, TROPONINI in the last 72 hours. Lab Results  Component Value Date   WBC 5.0 11/19/2014   HGB 12.3 11/19/2014   HCT 37.9 11/19/2014   MCV 92.9 11/19/2014   PLT 250 11/19/2014    Lab Results  Component Value Date   DDIMER 0.61* 11/13/2014   Invalid input(s): POCBNP    Component Value Date/Time   NA 139 11/19/2014 0513   K 3.9 11/19/2014 0513   CL 107 11/19/2014 0513   CO2 25 11/19/2014 0513   GLUCOSE 86 11/19/2014 0513   BUN 21 11/19/2014 0513   CREATININE 0.93 11/19/2014 0513   CALCIUM 9.3 11/19/2014 0513   PROT 6.5 11/14/2014 0720   ALBUMIN 3.7 11/14/2014 0720   AST 30 11/14/2014 0720   ALT 31 11/14/2014 0720   ALKPHOS 57 11/14/2014 0720   BILITOT 1.2 11/14/2014 0720  GFRNONAA 67* 11/19/2014 0513   GFRAA 78* 11/19/2014 0513   Lab Results  Component Value Date   CHOL 225* 09/07/2014   HDL 69.60 09/07/2014   LDLCALC 136* 09/07/2014   TRIG 95.0 09/07/2014    Accessory Clinical Findings  Echocardiogram - 11/15/2014 Left ventricle: EF 15-20% high sphericity index no obvious mural apical thrombus. The cavity size was severely dilated. Wall thickness was normal. - Mitral valve: There was mild to moderate regurgitation. - Left atrium: The atrium was mildly dilated. - Right atrium: The atrium was mildly dilated. - Atrial septum: No defect or patent foramen ovale was identified. - Tricuspid valve: There was mild-moderate regurgitation. - Pulmonary arteries: PA peak pressure: 65 mm Hg (S).  ECG - sinus rhythm, PVCs, left bundle branch block.    Assessment & Plan  1. Acute heart failure: newly diagnosed, NYHA III -  The patient has dyspnea with absolutely minimal activity, in addition she has been experiencing significant hypotension  associated with frequent presyncopal episodes. That led to discontinuation of beta blockers and decreasing the dose of lisinopril. On Saturday, 11/28/2014, she experienced palpitations associated with nausea dizziness and arm from her LifeVest and its were probable that she had ventricular tachycardia that was most probably abrupted with tachycardia pacing. Her blood pressure has been slightly higher and will try to start her on carvedilol 3.125 mg twice a day that will be cut in halves.  Originally we were hoping for medical therapy for at least 6 months and follow-up for improvement, however the patient has significant symptoms of heart failure that instructed with activities of daily living and therefore we will refer her to advance heart for your clinic for further recommendations, possible initiation of inotropes and possibly transplant evaluation. She is scheduled to see Dr. Aundra Dubin on Monday, March 6.  Today she doesn't seem to be significantly fluid overloaded we'll continue the same dose of Lasix 20 mg daily. We will discontinue coroner. We are unable to add spironolactone as her blood pressure is low.  2. non-ischemic dilated CMP, LVEF 15%, severely dilated LV - Management as above - repeat echo in 3 months   3.Pulmonary hypertension - most probably related to left-sided heart failure, we will monitor on an echocardiogram in 3 months.  Follow up with CHF clinic in 1 week.  Dorothy Spark, MD, [facility] 11/30/2014, 8:51 AM

## 2014-11-30 NOTE — Patient Instructions (Addendum)
You have been referred to the CHF Clinic at the [facility] at [facility] (entrance located on [address]). Appointment scheduled on Monday 12/07/14 at 3:20 pm. The code to enter the parking garage for March is 7000. Contact # for the Heart Failure clinic is [phone].  Your physician has recommended you make the following change in your medication:  1. START Carvedilol 3.125mg  take one-half tablet by mouth twice a day 2. STOP Corlanor 3. STOP Allegra D, START plain Allegra (over the counter) 4. You can RESTART Vyvanse

## 2014-12-07 ENCOUNTER — Telehealth: Payer: Self-pay | Admitting: *Deleted

## 2014-12-07 ENCOUNTER — Ambulatory Visit (HOSPITAL_COMMUNITY)
Admission: RE | Admit: 2014-12-07 | Discharge: 2014-12-07 | Disposition: A | Discharge status: Home / Self Care | Payer: 59 | Source: Ambulatory Visit | Attending: Cardiology | Admitting: Cardiology

## 2014-12-07 ENCOUNTER — Encounter (HOSPITAL_COMMUNITY): Payer: Self-pay

## 2014-12-07 VITALS — BP 98/56 | HR 68 | Wt 198.0 lb

## 2014-12-07 DIAGNOSIS — Z79899 Other long term (current) drug therapy: Secondary | ICD-10-CM | POA: Insufficient documentation

## 2014-12-07 DIAGNOSIS — I447 Left bundle-branch block, unspecified: Secondary | ICD-10-CM

## 2014-12-07 DIAGNOSIS — E785 Hyperlipidemia, unspecified: Secondary | ICD-10-CM | POA: Diagnosis not present

## 2014-12-07 DIAGNOSIS — I5022 Chronic systolic (congestive) heart failure: Secondary | ICD-10-CM | POA: Diagnosis present

## 2014-12-07 DIAGNOSIS — I5020 Unspecified systolic (congestive) heart failure: Secondary | ICD-10-CM

## 2014-12-07 LAB — COMPREHENSIVE METABOLIC PANEL
ALT: 14 U/L (ref 0–35)
ANION GAP: 10 (ref 5–15)
AST: 19 U/L (ref 0–37)
Albumin: 4.2 g/dL (ref 3.5–5.2)
Alkaline Phosphatase: 49 U/L (ref 39–117)
BILIRUBIN TOTAL: 0.5 mg/dL (ref 0.3–1.2)
BUN: 14 mg/dL (ref 6–23)
CO2: 24 mmol/L (ref 19–32)
CREATININE: 0.88 mg/dL (ref 0.50–1.10)
Calcium: 9.7 mg/dL (ref 8.4–10.5)
Chloride: 104 mmol/L (ref 96–112)
GFR calc Af Amer: 83 mL/min — ABNORMAL LOW (ref >90)
GFR, EST NON AFRICAN AMERICAN: 72 mL/min — AB (ref >90)
Glucose, Bld: 98 mg/dL (ref 70–99)
Potassium: 4.4 mmol/L (ref 3.5–5.1)
Sodium: 138 mmol/L (ref 135–145)
Total Protein: 7.7 g/dL (ref 6.0–8.3)

## 2014-12-07 MED ORDER — DIGOXIN 125 MCG PO TABS: 0.1250 mg | ORAL_TABLET | Freq: Every day | ORAL | Status: DC

## 2014-12-07 MED ORDER — SPIRONOLACTONE 25 MG PO TABS: 12.5000 mg | ORAL_TABLET | Freq: Every day | ORAL | Status: DC

## 2014-12-07 MED ORDER — FUROSEMIDE 20 MG PO TABS: 20.0000 mg | ORAL_TABLET | Freq: Every day | ORAL | Status: DC | PRN

## 2014-12-07 NOTE — Progress Notes (Signed)
Patient ID: Debra Holloway, female   DOB: September 07, 1956, 59 y.o.   MRN: [id number] PCP: Primary Cardiologist: Meda Coffee  HPI:  Debra Holloway is a 59 year old woman with h/o ADD, LBBB and depression. She was admitted to the hospital on 11/14/2014 with ADHF. 2-D echocardiogram demonstrates significantly reduced ejection fraction of around 15-20%.Underwent LHC which showed normal coronaries. While in the hospital she was experiencing significant hypotension and dosages of her meds had to be decreased significantly to the lowest possible doses. Before discharge and the hospital she experienced syncopal episode while being off telemetry and LifeVest palced.   Recently saw Dr. Meda Coffee c/o significant dizziness and presyncopal episodes and therefore her beta blocker was discontinued and the dose of lisinopril was decreased to 2.5 mg daily. The patient was started on Corlanor 5 mg twice a day. She called the on-call pager last week with tachypalpitations and presyncopal episode and severe flushing. She felt it was her corlanor and it was stopped. There was concern for VT but LifeVest hasn't fired. Now taking carvedilol 1.56 mg bid, lasix 20mg  daily and lisinopril 2.5 mg daily. Referred to HF clinic for further evaluation.  Says over past 2 days she is starting to feel a little better. Less dizziness. Says she is pushing herself a little less. No edema. Weight stable at 198. No palpitations or syncope. Continues with LifeVest alarms which she deactivates.   + snoring says she had sleep study in 2009 and told she was borderline.  Drinks occasional red wine. Previously on Vyvanse and Adderall    ROS: All systems negative except as listed in HPI, PMH and Problem List.  Past Medical History  Diagnosis Date  . ADD (attention deficit disorder)   . Environmental allergies   . Chicken pox   . H/O vitamin D deficiency   . Seasonal affective disorder   . LBBB (left bundle branch block)     W/1st degree AV Block, Avoid Beta  Blockers  . AR (allergic rhinitis)   . Depression   . Dyslipidemia     SH:  History   Social History  . Marital Status: Legally Separated    Spouse Name: N/A  . Number of Children: N/A  . Years of Education: N/A   Occupational History  . Not on file.   Social History Main Topics  . Smoking status: Never Smoker   . Smokeless tobacco: Never Used  . Alcohol Use: 2.4 oz/week    4 Glasses of wine per week  . Drug Use: No  . Sexual Activity: Not on file   Other Topics Concern  . Not on file   Social History Narrative    FH:  Family History  Problem Relation Age of Onset  . COPD Mother     Living  . Pancreatic cancer Father 27    Deceased  . Lung cancer Paternal Grandfather   . Heart disease Paternal Grandmother   . Hypertension Paternal Grandmother   . Alzheimer's disease Maternal Grandfather   . Arthritis/Rheumatoid Maternal Grandmother   . Heart disease Maternal Grandmother     Tachycardia  . Cancer Paternal Uncle   . Breast cancer Sister 74  . Healthy Brother     x3  . Healthy Daughter     x3     Current Outpatient Prescriptions  Medication Sig Dispense Refill  . carvedilol (COREG) 3.125 MG tablet Take one-half tablet by mouth twice a day 30 tablet 2  . fexofenadine (ALLEGRA) 180 MG tablet Take 1 tablet (180 mg total)  by mouth daily.    . fluticasone (FLONASE) 50 MCG/ACT nasal spray Place 1 spray into both nostrils daily.    . furosemide (LASIX) 20 MG tablet Take 1 tablet (20 mg total) by mouth daily. 30 tablet 1  . lisinopril (PRINIVIL,ZESTRIL) 2.5 MG tablet Take 1 tablet (2.5 mg total) by mouth daily. 30 tablet 1  . montelukast (SINGULAIR) 10 MG tablet Take 1 tablet (10 mg total) by mouth at bedtime. 90 tablet 1  . Vitamin D, Ergocalciferol, (DRISDOL) 50000 UNITS CAPS capsule Take 1 capsule (50,000 Units total) by mouth every 7 (seven) days. (Patient taking differently: Take 50,000 Units by mouth every 7 (seven) days. No specific day) 8 capsule 0   No  current facility-administered medications for this encounter.    Filed Vitals:   12/07/14 1458  BP: 98/56  Pulse: 68  Weight: 198 lb (89.812 kg)  SpO2: 98%    PHYSICAL EXAM:  General:  Well appearing. No resp difficulty HEENT: normal Neck: supple. JVP flat. Carotids 2+ bilaterally; no bruits. No lymphadenopathy or thryomegaly appreciated. Cor: PMI normal. Regular rate & rhythm. 2/6 SEM at RSB Lungs: clear Abdomen: obese, soft, nontender, nondistended. No hepatosplenomegaly. No bruits or masses. Good bowel sounds. Extremities: no cyanosis, clubbing, rash, edema Neuro: alert & orientedx3, cranial nerves grossly intact. Moves all 4 extremities w/o difficulty. Affect pleasant.   ASSESSMENT & PLAN:  1) Chronic systolic HF due to NICM - EF 15-20% (ECHO 2/16). Normal coronaries on cath. Suspect viral CM. But could also be related to OSA or ADD meds --she is NYHA III. Volume status looks good. BP has been soft and has limited med titration - will add digoxin 0.125 daily - stop lasix. Start spiro 12.5 daily. Use lasix prn for weight gain.  - schedule cMRI - interrogate LifeVest - check CMET, HIV, hepatitis, iron, ANA - if EF remains low will need CRT-D - eventual sleep study - avoid ADD meds  2) LBBB  Total time spent 45 minutes. Over half that time spent discussing above.   RTC in 2weeks  Benay Spice 3:44 PM

## 2014-12-07 NOTE — Patient Instructions (Signed)
START Digoxin 0.125 mg (1 tablet) once daily.  START Spironolactone 12.5 mg (1/2 tablet) once daily.  Use Lasix ONLY as needed for weight gain of 3 lbs or greater overnight.  Will refer [facility] to the [facility].  We have ordered a Cardiac MRI. Radiology department will contact you to schedule this.  Follow up 2 weeks.  Do the following things EVERYDAY: 1) Weigh yourself in the morning before breakfast. Write it down and keep it in a log. 2) Take your medicines as prescribed 3) Eat low salt foods-Limit salt (sodium) to 2000 mg per day.  4) Stay as active as you can everyday 5) Limit all fluids for the day to less than 2 liters

## 2014-12-07 NOTE — Telephone Encounter (Signed)
Prior authorization for Vyvanse and Adderall initiated. Awaiting determination. JG//CMA

## 2014-12-08 LAB — IRON: Iron: 82 ug/dL (ref 42–145)

## 2014-12-08 LAB — HEPATITIS PANEL, ACUTE
HCV Ab: NEGATIVE
HEP A IGM: NONREACTIVE
Hep B C IgM: NONREACTIVE
Hepatitis B Surface Ag: NEGATIVE

## 2014-12-08 LAB — HIV ANTIBODY (ROUTINE TESTING W REFLEX): HIV SCREEN 4TH GENERATION: NONREACTIVE

## 2014-12-08 NOTE — Telephone Encounter (Signed)
PA for Adderall approved effective through 12/07/2015.  PA for Vyvanse approved effective through 12/07/2015.

## 2014-12-09 ENCOUNTER — Encounter: Payer: 59 | Admitting: Cardiology

## 2014-12-17 ENCOUNTER — Encounter: Payer: Self-pay | Admitting: Internal Medicine

## 2014-12-24 ENCOUNTER — Ambulatory Visit (HOSPITAL_COMMUNITY)
Admission: RE | Admit: 2014-12-24 | Discharge: 2014-12-24 | Disposition: A | Discharge status: Home / Self Care | Payer: 59 | Source: Ambulatory Visit | Attending: Internal Medicine | Admitting: Internal Medicine

## 2014-12-24 VITALS — BP 98/60 | HR 63 | Wt 200.8 lb

## 2014-12-24 DIAGNOSIS — I5021 Acute systolic (congestive) heart failure: Secondary | ICD-10-CM | POA: Diagnosis not present

## 2014-12-24 DIAGNOSIS — E785 Hyperlipidemia, unspecified: Secondary | ICD-10-CM | POA: Diagnosis not present

## 2014-12-24 DIAGNOSIS — I5022 Chronic systolic (congestive) heart failure: Secondary | ICD-10-CM | POA: Diagnosis not present

## 2014-12-24 DIAGNOSIS — I447 Left bundle-branch block, unspecified: Secondary | ICD-10-CM | POA: Diagnosis not present

## 2014-12-24 DIAGNOSIS — Z79899 Other long term (current) drug therapy: Secondary | ICD-10-CM | POA: Diagnosis not present

## 2014-12-24 DIAGNOSIS — I429 Cardiomyopathy, unspecified: Secondary | ICD-10-CM | POA: Insufficient documentation

## 2014-12-24 LAB — DIGOXIN LEVEL: Digoxin Level: 0.4 ng/mL — ABNORMAL LOW (ref 0.8–2.0)

## 2014-12-24 LAB — BASIC METABOLIC PANEL
Anion gap: 7 (ref 5–15)
BUN: 13 mg/dL (ref 6–23)
CO2: 27 mmol/L (ref 19–32)
CREATININE: 0.91 mg/dL (ref 0.50–1.10)
Calcium: 9.3 mg/dL (ref 8.4–10.5)
Chloride: 106 mmol/L (ref 96–112)
GFR calc non Af Amer: 69 mL/min — ABNORMAL LOW (ref >90)
GFR, EST AFRICAN AMERICAN: 80 mL/min — AB (ref >90)
Glucose, Bld: 98 mg/dL (ref 70–99)
Potassium: 4.2 mmol/L (ref 3.5–5.1)
Sodium: 140 mmol/L (ref 135–145)

## 2014-12-24 MED ORDER — SPIRONOLACTONE 25 MG PO TABS: 25.0000 mg | ORAL_TABLET | Freq: Every day | ORAL | Status: DC

## 2014-12-24 MED ORDER — CARVEDILOL 3.125 MG PO TABS: 3.1250 mg | ORAL_TABLET | Freq: Two times a day (BID) | ORAL | Status: DC

## 2014-12-24 MED ORDER — LISINOPRIL 2.5 MG PO TABS: 2.5000 mg | ORAL_TABLET | Freq: Every day | ORAL | Status: DC

## 2014-12-24 NOTE — Patient Instructions (Addendum)
Increase Carvedilol to 3.125 mg Twice daily   Increase Spironolactone to 25 mg (1 tab) daily  Labs today  Your physician recommends that you schedule a follow-up appointment in: 2 weeks

## 2014-12-24 NOTE — Progress Notes (Signed)
Patient ID: Debra Holloway, female   DOB: 03-23-1956, 59 y.o.   MRN: [id number] Primary Cardiologist: Meda Coffee  HPI: Debra Holloway is a 59 year old woman with h/o ADD, LBBB and depression. She was admitted to the hospital on 11/14/2014 with ADHF. 2-D echocardiogram demonstrates significantly reduced ejection fraction of around 15-20%.Underwent LHC which showed normal coronaries. While in the hospital she was experiencing significant hypotension and dosages of her meds had to be decreased significantly to the lowest possible doses. Before discharge and the hospital she experienced syncopal episode while being off telemetry and LifeVest palced.   Recently saw Dr. Meda Coffee c/o significant dizziness and presyncopal episodes and therefore her beta blocker was discontinued and the dose of lisinopril was decreased to 2.5 mg daily. The patient was started on Corlanor 5 mg twice a day. She called the on-call pager last week with tachypalpitations and presyncopal episode and severe flushing. She felt it was her corlanor and it was stopped. There was concern for VT but LifeVest hasn't fired. Now taking carvedilol 1.56 mg bid, lasix 20mg  daily and lisinopril 2.5 mg daily. Referred to HF clinic for further evaluation.  She returns for heart failure follow up. Last visit dig was added and spiro was started. Denies SOB/PND/Orthopnea. SOB with steps. Taking all medications. Taking lasix as needed. Has needed lasix on 2 occasions. Wearing lifevest. She had level 3 alarm on Lifevest while brushing her teeth. She is currently not working.   Labs 12/07/2014: K 4.4 Creatinine 0.88 HIV NR.   ROS: All systems negative except as listed in HPI, PMH and Problem List.  Past Medical History  Diagnosis Date  . ADD (attention deficit disorder)   . Environmental allergies   . Chicken pox   . H/O vitamin D deficiency   . Seasonal affective disorder   . LBBB (left bundle branch block)     W/1st degree AV Block, Avoid Beta Blockers  . AR  (allergic rhinitis)   . Depression   . Dyslipidemia     SH:  History   Social History  . Marital Status: Legally Separated    Spouse Name: N/A  . Number of Children: N/A  . Years of Education: N/A   Occupational History  . Not on file.   Social History Main Topics  . Smoking status: Never Smoker   . Smokeless tobacco: Never Used  . Alcohol Use: 2.4 oz/week    4 Glasses of wine per week  . Drug Use: No  . Sexual Activity: Not on file   Other Topics Concern  . Not on file   Social History Narrative    FH:  Family History  Problem Relation Age of Onset  . COPD Mother     Living  . Pancreatic cancer Father 40    Deceased  . Lung cancer Paternal Grandfather   . Heart disease Paternal Grandmother   . Hypertension Paternal Grandmother   . Alzheimer's disease Maternal Grandfather   . Arthritis/Rheumatoid Maternal Grandmother   . Heart disease Maternal Grandmother     Tachycardia  . Cancer Paternal Uncle   . Breast cancer Sister 19  . Healthy Brother     x3  . Healthy Daughter     x3     Current Outpatient Prescriptions  Medication Sig Dispense Refill  . carvedilol (COREG) 3.125 MG tablet Take one-half tablet by mouth twice a day 30 tablet 2  . digoxin (LANOXIN) 0.125 MG tablet Take 1 tablet (0.125 mg total) by mouth daily. 90 tablet 3  .  fexofenadine (ALLEGRA) 180 MG tablet Take 1 tablet (180 mg total) by mouth daily.    . fluticasone (FLONASE) 50 MCG/ACT nasal spray Place 1 spray into both nostrils daily.    . furosemide (LASIX) 20 MG tablet Take 1 tablet (20 mg total) by mouth daily as needed (Weight gain 3 lbs or more over night.). 15 tablet 3  . lisinopril (PRINIVIL,ZESTRIL) 2.5 MG tablet Take 1 tablet (2.5 mg total) by mouth daily. 30 tablet 1  . montelukast (SINGULAIR) 10 MG tablet Take 1 tablet (10 mg total) by mouth at bedtime. 90 tablet 1  . spironolactone (ALDACTONE) 25 MG tablet Take 0.5 tablets (12.5 mg total) by mouth daily. 45 tablet 3  . Vitamin  D, Ergocalciferol, (DRISDOL) 50000 UNITS CAPS capsule Take 1 capsule (50,000 Units total) by mouth every 7 (seven) days. (Patient taking differently: Take 50,000 Units by mouth every 7 (seven) days. No specific day) 8 capsule 0   No current facility-administered medications for this encounter.    Filed Vitals:   12/24/14 1520  BP: 98/60  Pulse: 63  Weight: 200 lb 12.8 oz (91.082 kg)  SpO2: 98%    PHYSICAL EXAM:  General:  Well appearing. No resp difficulty HEENT: normal Neck: supple. JVP flat. Carotids 2+ bilaterally; no bruits. No lymphadenopathy or thryomegaly appreciated. Cor: PMI normal. Regular rate & rhythm. 2/6 SEM at RSB Lungs: clear Abdomen: obese, soft, nontender, nondistended. No hepatosplenomegaly. No bruits or masses. Good bowel sounds. Extremities: no cyanosis, clubbing, rash, edema Neuro: alert & orientedx3, cranial nerves grossly intact. Moves all 4 extremities w/o difficulty. Affect pleasant.   ASSESSMENT & PLAN:  1) Chronic systolic HF due to NICM - EF 15-20% (ECHO 2/16). Normal coronaries on cath. Suspect viral CM. But could also be related to OSA or ADD meds --she is NYHA II- III. Volume status looks good. Continue lasix as needed.  - Increase spiro to 25 mg daily  - Increase carvedilol to 3.125 mg twice a day.  -Continue digoxin 0.125 daily -Continue lisinopril 2.5 mg daily.  - cMRI April 4th.  - Continue LifeVest - if EF remains low will need CRT-D.  - eventual sleep study - avoid ADD meds  2) LBBB  Follow up in 2 weeks with BMET and dig level today.  CLEGG,AMY, NP-C 3:26 PM  Patient seen and examined with Darrick Grinder, NP. We discussed all aspects of the encounter. I agree with the assessment and plan as stated above.   Symptomatically improved. Volume status looks good. Agree with med titration as above. Long talk about LifeVest. We interrogated the device and her level 3 alarm was artifact. We reassured her. Continue to avoid ADHD stimulants.    Benay Spice 4:38 PM

## 2014-12-31 ENCOUNTER — Other Ambulatory Visit (HOSPITAL_COMMUNITY): Payer: Self-pay | Admitting: *Deleted

## 2014-12-31 DIAGNOSIS — I5021 Acute systolic (congestive) heart failure: Secondary | ICD-10-CM

## 2014-12-31 MED ORDER — SPIRONOLACTONE 25 MG PO TABS: 25.0000 mg | ORAL_TABLET | Freq: Every day | ORAL | Status: DC

## 2014-12-31 MED ORDER — CARVEDILOL 3.125 MG PO TABS: 3.1250 mg | ORAL_TABLET | Freq: Two times a day (BID) | ORAL | Status: DC

## 2015-01-01 ENCOUNTER — Other Ambulatory Visit: Payer: Self-pay

## 2015-01-01 ENCOUNTER — Ambulatory Visit (HOSPITAL_COMMUNITY)
Admission: RE | Admit: 2015-01-01 | Discharge: 2015-01-01 | Disposition: A | Discharge status: Home / Self Care | Payer: 59 | Source: Ambulatory Visit | Attending: Internal Medicine | Admitting: Internal Medicine

## 2015-01-01 DIAGNOSIS — I5022 Chronic systolic (congestive) heart failure: Secondary | ICD-10-CM

## 2015-01-01 DIAGNOSIS — I5021 Acute systolic (congestive) heart failure: Secondary | ICD-10-CM

## 2015-01-01 MED ORDER — CARVEDILOL 3.125 MG PO TABS: 3.1250 mg | ORAL_TABLET | Freq: Two times a day (BID) | ORAL | Status: DC

## 2015-01-01 NOTE — Progress Notes (Signed)
Pt came in as an OP for Cardiac MRI.  Pt was calm until we put her in the scanner and immediately wanted out.  She was short of breath, crying, clutching her chest saying she was having chest pain.  I asked her if this was more than she had been experiencing and she said she was just scared.  She remained in the room crying for several moments, then we brought her out into the patient care area to get dressed and try to calm down.  Exam has been aborted at this point.  I called Dr Bensimhon's office but had to leave a voice mail as they were unavailable.   If pt recovers, she will be released on her own recognizance.

## 2015-01-04 ENCOUNTER — Telehealth (HOSPITAL_COMMUNITY): Payer: Self-pay | Admitting: Vascular Surgery

## 2015-01-04 NOTE — Telephone Encounter (Signed)
Pt left message she did not have her MRI on Friday, she is extremely claustrophobic ,she wants toe get an open MRI is possible.. Please advise

## 2015-01-04 NOTE — Telephone Encounter (Signed)
Spoke w/pt she states she went for cardiac MRI and when they put her into the machine she flipped out, she states she felt like she was in a coffin and she couldn't handle it.  She is tearful and upset on the phone that she couldn't have the test done.  Explained they can not do an open MRI for a cardiac mris.  Will send to MD for review of next step and call her back.

## 2015-01-05 ENCOUNTER — Telehealth: Payer: Self-pay | Admitting: *Deleted

## 2015-01-05 ENCOUNTER — Other Ambulatory Visit: Payer: Self-pay | Admitting: *Deleted

## 2015-01-05 MED ORDER — ALPRAZOLAM 2 MG PO TABS: 2.0000 mg | ORAL_TABLET | ORAL | Status: DC | PRN

## 2015-01-05 MED ORDER — ALPRAZOLAM 1 MG PO TABS: 1.0000 mg | ORAL_TABLET | ORAL | Status: DC | PRN

## 2015-01-05 NOTE — Telephone Encounter (Deleted)
Correction on Xanax.  Called into pts pharmacy and

## 2015-01-05 NOTE — Telephone Encounter (Signed)
-----   Message from Dorothy Spark, MD sent at 01/05/2015 12:32 PM EDT ----- Regarding: RE: Cardiac MRI 1 mg x 2 pills  ----- Message -----    From: Nuala Alpha, LPN    Sent: 10/07/1094  11:55 AM      To: Dorothy Spark, MD Subject: RE: Cardiac MRI                                How much of xanax would you like for the pt to take?  Need a dose and dispense amount.     Thanks,    Karlene Einstein  ----- Message -----    From: Dorothy Spark, MD    Sent: 01/04/2015  10:56 PM      To: Nuala Alpha, LPN Subject: RE: Cardiac MRI                                Karlene Einstein, Please call her to see if she would try again with some Xanax. Thank you, KN  ----- Message -----    From: Nuala Alpha, LPN    Sent: 0/01/5408   8:47 AM      To: Dorothy Spark, MD Subject: FW: Cardiac MRI                                fyi  ----- Message -----    From: Corinna Lines    Sent: 01/04/2015   8:44 AM      To: Nuala Alpha, LPN Subject: Cardiac MRI                                      Karlene Einstein  This patient was very claustrophobic and did not handle the MRI well, so it was cancelled.  Let me know if you want it rescheduled.  Thanks [facility]

## 2015-01-05 NOTE — Telephone Encounter (Addendum)
Phoned in the order for Xanax 1 mg take one by mouth as needed for her cardiac MRI.  Dispense amount was for 2 pills only. Pharmacist at [facility] took the verbal order over the phone and placed exact time to take this medication prior to her Cardiac MRI, and as needed through the MRI. Pt is aware that this was called in, and would like for her cardiac MRI to be rescheduled.  Informed the pt that I will send a message to our MRI scheduler to have this set-up and rescheduled.  Pt verbalized understanding and agrees with this plan.

## 2015-01-07 ENCOUNTER — Encounter: Payer: Self-pay | Admitting: Internal Medicine

## 2015-01-07 ENCOUNTER — Ambulatory Visit (HOSPITAL_COMMUNITY)
Admission: RE | Admit: 2015-01-07 | Discharge: 2015-01-07 | Disposition: A | Discharge status: Home / Self Care | Payer: 59 | Source: Ambulatory Visit | Attending: Cardiology | Admitting: Cardiology

## 2015-01-07 VITALS — BP 100/70 | HR 76 | Wt 200.8 lb

## 2015-01-07 DIAGNOSIS — I5021 Acute systolic (congestive) heart failure: Secondary | ICD-10-CM

## 2015-01-07 DIAGNOSIS — E785 Hyperlipidemia, unspecified: Secondary | ICD-10-CM | POA: Insufficient documentation

## 2015-01-07 DIAGNOSIS — I447 Left bundle-branch block, unspecified: Secondary | ICD-10-CM

## 2015-01-07 DIAGNOSIS — I5022 Chronic systolic (congestive) heart failure: Secondary | ICD-10-CM | POA: Diagnosis not present

## 2015-01-07 DIAGNOSIS — Z79899 Other long term (current) drug therapy: Secondary | ICD-10-CM | POA: Diagnosis not present

## 2015-01-07 DIAGNOSIS — F329 Major depressive disorder, single episode, unspecified: Secondary | ICD-10-CM | POA: Insufficient documentation

## 2015-01-07 LAB — BASIC METABOLIC PANEL
Anion gap: 6 (ref 5–15)
BUN: 19 mg/dL (ref 6–23)
CO2: 27 mmol/L (ref 19–32)
Calcium: 9.2 mg/dL (ref 8.4–10.5)
Chloride: 104 mmol/L (ref 96–112)
Creatinine, Ser: 1 mg/dL (ref 0.50–1.10)
GFR calc Af Amer: 71 mL/min — ABNORMAL LOW (ref >90)
GFR, EST NON AFRICAN AMERICAN: 61 mL/min — AB (ref >90)
Glucose, Bld: 98 mg/dL (ref 70–99)
POTASSIUM: 4.2 mmol/L (ref 3.5–5.1)
SODIUM: 137 mmol/L (ref 135–145)

## 2015-01-07 LAB — DIGOXIN LEVEL: Digoxin Level: 0.4 ng/mL — ABNORMAL LOW (ref 0.8–2.0)

## 2015-01-07 MED ORDER — CARVEDILOL 3.125 MG PO TABS: ORAL_TABLET | ORAL | Status: DC

## 2015-01-07 NOTE — Patient Instructions (Signed)
Increase Carvedilol to 3.125 mg (1 tab) in AM and 6.25 mg (2 tabs) in PM  Labs today  Your physician recommends that you schedule a follow-up appointment in: 3 weeks

## 2015-01-07 NOTE — Progress Notes (Signed)
Patient ID: Debra Holloway, female   DOB: [birth year], 59 y.o.   MRN: [id number] Primary Cardiologist: Meda Coffee  HPI: Debra Holloway is a 59 year old woman with h/o ADD, LBBB and depression. She was admitted to the hospital on 11/14/2014 with ADHF. 2-D echocardiogram demonstrates significantly reduced ejection fraction of around 15-20%.Underwent LHC which showed normal coronaries. While in the hospital she was experiencing significant hypotension and dosages of her meds had to be decreased significantly to the lowest possible doses. Before discharge and the hospital she experienced syncopal episode while being off telemetry and LifeVest palced.   Recently saw Dr. Meda Coffee c/o significant dizziness and presyncopal episodes and therefore her beta blocker was discontinued and the dose of lisinopril was decreased to 2.5 mg daily. The patient was started on Corlanor 5 mg twice a day. She called the on-call pager last week with tachypalpitations and presyncopal episode and severe flushing. She felt it was her corlanor and it was stopped. There was concern for VT but LifeVest hasn't fired. Now taking carvedilol 1.56 mg bid, lasix 20mg  daily and lisinopril 2.5 mg daily. Referred to HF clinic for further evaluation.  She returns for heart failure follow up. Last visit spiro and carvedilol increased. Wearing Life Vest. Denies SOB/PND/PND. CMRI rescheduled due to anxiety. Weight at home 198-200 pounds. Taking all medications.   Labs 12/07/2014: K 4.4 Creatinine 0.88 HIV NR.  Labs 12/24/2014: K 4.2 Creatinine 0.91   ROS: All systems negative except as listed in HPI, PMH and Problem List.  Past Medical History  Diagnosis Date  . ADD (attention deficit disorder)   . Environmental allergies   . Chicken pox   . H/O vitamin D deficiency   . Seasonal affective disorder   . LBBB (left bundle branch block)     W/1st degree AV Block, Avoid Beta Blockers  . AR (allergic rhinitis)   . Depression   . Dyslipidemia     SH:   History   Social History  . Marital Status: Legally Separated    Spouse Name: N/A  . Number of Children: N/A  . Years of Education: N/A   Occupational History  . Not on file.   Social History Main Topics  . Smoking status: Never Smoker   . Smokeless tobacco: Never Used  . Alcohol Use: 2.4 oz/week    4 Glasses of wine per week  . Drug Use: No  . Sexual Activity: Not on file   Other Topics Concern  . Not on file   Social History Narrative    FH:  Family History  Problem Relation Age of Onset  . COPD Mother     Living  . Pancreatic cancer Father 17    Deceased  . Lung cancer Paternal Grandfather   . Heart disease Paternal Grandmother   . Hypertension Paternal Grandmother   . Alzheimer's disease Maternal Grandfather   . Arthritis/Rheumatoid Maternal Grandmother   . Heart disease Maternal Grandmother     Tachycardia  . Cancer Paternal Uncle   . Breast cancer Sister 18  . Healthy Brother     x3  . Healthy Daughter     x3     Current Outpatient Prescriptions  Medication Sig Dispense Refill  . ALPRAZolam (XANAX) 1 MG tablet Take 1 tablet (1 mg total) by mouth as needed (for cardiac MRI and claustrophobia). 2 tablet 0  . carvedilol (COREG) 3.125 MG tablet Take 1 tablet (3.125 mg total) by mouth 2 (two) times daily with a meal. 60 tablet 2  .  digoxin (LANOXIN) 0.125 MG tablet Take 1 tablet (0.125 mg total) by mouth daily. 90 tablet 3  . fexofenadine (ALLEGRA) 180 MG tablet Take 1 tablet (180 mg total) by mouth daily.    . fluticasone (FLONASE) 50 MCG/ACT nasal spray Place 1 spray into both nostrils daily.    . furosemide (LASIX) 20 MG tablet Take 1 tablet (20 mg total) by mouth daily as needed (Weight gain 3 lbs or more over night.). 15 tablet 3  . lisinopril (PRINIVIL,ZESTRIL) 2.5 MG tablet Take 1 tablet (2.5 mg total) by mouth daily. 30 tablet 3  . montelukast (SINGULAIR) 10 MG tablet Take 1 tablet (10 mg total) by mouth at bedtime. 90 tablet 1  . spironolactone  (ALDACTONE) 25 MG tablet Take 1 tablet (25 mg total) by mouth daily. 30 tablet 3  . Vitamin D, Ergocalciferol, (DRISDOL) 50000 UNITS CAPS capsule Take 1 capsule (50,000 Units total) by mouth every 7 (seven) days. (Patient taking differently: Take 50,000 Units by mouth every 7 (seven) days. No specific day) 8 capsule 0   No current facility-administered medications for this encounter.    Filed Vitals:   01/07/15 1549  BP: 100/70  Pulse: 76  Weight: 200 lb 12.8 oz (91.082 kg)  SpO2: 98%    PHYSICAL EXAM:  General:  Well appearing. No resp difficulty HEENT: normal Neck: supple. JVP flat. Carotids 2+ bilaterally; no bruits. No lymphadenopathy or thryomegaly appreciated. Cor: PMI normal. Regular rate & rhythm. 2/6 SEM at RSB Lungs: clear Abdomen: obese, soft, nontender, nondistended. No hepatosplenomegaly. No bruits or masses. Good bowel sounds. Extremities: no cyanosis, clubbing, rash, edema Neuro: alert & orientedx3, cranial nerves grossly intact. Moves all 4 extremities w/o difficulty. Affect pleasant.   ASSESSMENT & PLAN:  1) Chronic systolic HF due to NICM - EF 15-20% (ECHO 2/16). Normal coronaries on cath. Suspect viral CM. But could also be related to OSA or ADD meds -- NYHA II. Volume status looks good. Continue lasix as needed.  - Continue spiro to 25 mg daily  - Continue carvedilol to 3.125 mg in am and increase pm carvedilol to 6.25 mg.  She is intolerant corlanor.  -Continue digoxin 0.125 daily -Continue lisinopril 2.5 mg daily.  - cMRI rescheduled April 20th.  - Continue LifeVest - if EF remains low will need CRT-D.  - eventual sleep study - avoid ADD meds  2) LBBB  Follow up in  3-4 weeks  Check BMET and dig level today.  CLEGG,AMY, NP-C 4:14 PM

## 2015-01-08 NOTE — Telephone Encounter (Signed)
This was discussed at [place] 4/7, pt has resch cardiac MRI and will try it again with xanax

## 2015-01-08 NOTE — Telephone Encounter (Signed)
Just cancel MRI. We will proceed without it. Tell her we are sorry she had such a hard time with it.

## 2015-01-15 ENCOUNTER — Telehealth (HOSPITAL_COMMUNITY): Payer: Self-pay | Admitting: *Deleted

## 2015-01-15 ENCOUNTER — Encounter (HOSPITAL_COMMUNITY): Payer: Self-pay | Admitting: *Deleted

## 2015-01-15 NOTE — Telephone Encounter (Signed)
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  Per Dr Aundra Dubin pt can return with no lifting >25 lb and rest breaks as needed, letter completed and emailed to patient at [email or web address]  per her request

## 2015-01-20 ENCOUNTER — Ambulatory Visit (HOSPITAL_COMMUNITY)
Admission: RE | Admit: 2015-01-20 | Discharge: 2015-01-20 | Disposition: A | Discharge status: Home / Self Care | Payer: 59 | Source: Ambulatory Visit | Attending: Internal Medicine | Admitting: Internal Medicine

## 2015-01-20 DIAGNOSIS — I420 Dilated cardiomyopathy: Secondary | ICD-10-CM | POA: Diagnosis not present

## 2015-01-20 DIAGNOSIS — I071 Rheumatic tricuspid insufficiency: Secondary | ICD-10-CM | POA: Diagnosis not present

## 2015-01-20 DIAGNOSIS — I5022 Chronic systolic (congestive) heart failure: Secondary | ICD-10-CM | POA: Insufficient documentation

## 2015-01-20 MED ORDER — GADOBENATE DIMEGLUMINE 529 MG/ML IV SOLN
30.0000 mL | Freq: Once | INTRAVENOUS | Status: AC | PRN
  Administered 2015-01-20: 30 mL via INTRAVENOUS

## 2015-01-21 ENCOUNTER — Other Ambulatory Visit (HOSPITAL_COMMUNITY): Payer: Self-pay | Admitting: Cardiology

## 2015-01-21 DIAGNOSIS — I5022 Chronic systolic (congestive) heart failure: Secondary | ICD-10-CM

## 2015-01-21 MED ORDER — LISINOPRIL 2.5 MG PO TABS: 2.5000 mg | ORAL_TABLET | Freq: Every day | ORAL | Status: DC

## 2015-01-22 ENCOUNTER — Encounter (HOSPITAL_COMMUNITY): Payer: Self-pay

## 2015-01-22 ENCOUNTER — Ambulatory Visit (HOSPITAL_COMMUNITY)
Admission: RE | Admit: 2015-01-22 | Discharge: 2015-01-22 | Disposition: A | Discharge status: Home / Self Care | Payer: 59 | Source: Ambulatory Visit | Attending: Internal Medicine | Admitting: Internal Medicine

## 2015-01-22 VITALS — BP 112/64 | HR 69 | Wt 199.8 lb

## 2015-01-22 DIAGNOSIS — E785 Hyperlipidemia, unspecified: Secondary | ICD-10-CM | POA: Diagnosis not present

## 2015-01-22 DIAGNOSIS — I447 Left bundle-branch block, unspecified: Secondary | ICD-10-CM | POA: Insufficient documentation

## 2015-01-22 DIAGNOSIS — F329 Major depressive disorder, single episode, unspecified: Secondary | ICD-10-CM | POA: Diagnosis not present

## 2015-01-22 DIAGNOSIS — I5022 Chronic systolic (congestive) heart failure: Secondary | ICD-10-CM | POA: Diagnosis present

## 2015-01-22 DIAGNOSIS — I5021 Acute systolic (congestive) heart failure: Secondary | ICD-10-CM

## 2015-01-22 DIAGNOSIS — Z79899 Other long term (current) drug therapy: Secondary | ICD-10-CM | POA: Diagnosis not present

## 2015-01-22 DIAGNOSIS — I429 Cardiomyopathy, unspecified: Secondary | ICD-10-CM | POA: Insufficient documentation

## 2015-01-22 MED ORDER — CARVEDILOL 6.25 MG PO TABS: 6.2500 mg | ORAL_TABLET | Freq: Two times a day (BID) | ORAL | Status: DC

## 2015-01-22 MED ORDER — LISINOPRIL 2.5 MG PO TABS: 2.5000 mg | ORAL_TABLET | Freq: Two times a day (BID) | ORAL | Status: DC

## 2015-01-22 NOTE — Patient Instructions (Signed)
INCREASE Carvedilol (Coreg) to 6.25 mg (1 tablet) twice daily.  INCREASE Lisinopril to 2.5 mg twice daily.  Return in 3 weeks with lab work.  Do the following things EVERYDAY: 1) Weigh yourself in the morning before breakfast. Write it down and keep it in a log. 2) Take your medicines as prescribed 3) Eat low salt foods-Limit salt (sodium) to 2000 mg per day.  4) Stay as active as you can everyday 5) Limit all fluids for the day to less than 2 liters

## 2015-01-22 NOTE — Progress Notes (Signed)
Patient ID: Debra Holloway, female   DOB: 1956/06/22, 59 y.o.   MRN: [id number] PCP: Elyn Aquas Primary Cardiologist: Meda Coffee  HPI: Debra Holloway is a 60 year old woman with h/o ADD, LBBB and depression. She was admitted to the hospital on 11/14/2014 with ADHF. 2-D echocardiogram demonstrates significantly reduced ejection fraction of around 15-20%.  Underwent LHC which showed normal coronaries. While in the hospital she was experiencing significant hypotension and dosages of her meds had to be decreased significantly to the lowest possible doses. Before discharge and the hospital she experienced syncopal episode while being off telemetry and LifeVest placed. She has subsequently taken off the LifeVest, says that she cannot tolerate it. Cardiac MRI in 4/16 showed EF 29% with severe LV dilation, normal RV size/systolic function, moderate MR, no LGE.    Since last appointment, she has been doing well symptomatically.  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  She has some chest tightness that seems to be related to stress.  No orthopnea or PND.  She is able to walk on flat ground for a hundred yards or so without dyspnea.  Some dyspnea walking up a flight of steps.  Overall doing better.  BP is running higher now and she is tolerating her meds without lightheadedness.   Labs 12/07/2014: K 4.4 Creatinine 0.88 HIV NR.  Labs 12/24/2014: K 4.2 Creatinine 0.91  Labs 4/16: K 4.2, creatinine 1.0, digoxin 0.4  ROS: All systems negative except as listed in HPI, PMH and Problem List.  Past Medical History  Diagnosis Date  . ADD (attention deficit disorder)   . Environmental allergies   . Chicken pox   . H/O vitamin D deficiency   . Seasonal affective disorder   . LBBB (left bundle branch block)     W/1st degree AV Block, Avoid Beta Blockers  . AR (allergic rhinitis)   . Depression   . Dyslipidemia     SH:  History   Social  History  . Marital Status: Legally Separated    Spouse Name: Debra Holloway  . Number of Children: Debra Holloway  . Years of Education: Debra Holloway   Occupational History  . Not on file.   Social History Main Topics  . Smoking status: Never Smoker   . Smokeless tobacco: Never Used  . Alcohol Use: 2.4 oz/week    4 Glasses of wine per week  . Drug Use: No  . Sexual Activity: Not on file   Other Topics Concern  . Not on file   Social History Narrative    FH:  Family History  Problem Relation Age of Onset  . COPD Mother     Living  . Pancreatic cancer Father 55    Deceased  . Lung cancer Paternal Grandfather   . Heart disease Paternal Grandmother   . Hypertension Paternal Grandmother   . Alzheimer's disease Maternal Grandfather   . Arthritis/Rheumatoid Maternal Grandmother   . Heart disease Maternal Grandmother     Tachycardia  . Cancer Paternal Uncle   . Breast cancer Debra Holloway 63  . Healthy Debra Holloway     x3  . Healthy Daughter     x3     Current Outpatient Prescriptions  Medication Sig Dispense Refill  . carvedilol (COREG) 6.25 MG tablet Take 1 tablet (6.25 mg total) by mouth 2 (two) times daily with a meal. 60 tablet 3  . digoxin (LANOXIN) 0.125 MG tablet Take 1 tablet (0.125 mg  total) by mouth daily. 90 tablet 3  . fluticasone (FLONASE) 50 MCG/ACT nasal spray Place 1 spray into both nostrils daily.    . furosemide (LASIX) 20 MG tablet Take 1 tablet (20 mg total) by mouth daily as needed (Weight gain 3 lbs or more over night.). 15 tablet 3  . lisinopril (PRINIVIL,ZESTRIL) 2.5 MG tablet Take 1 tablet (2.5 mg total) by mouth 2 (two) times daily. 60 tablet 3  . montelukast (SINGULAIR) 10 MG tablet Take 1 tablet (10 mg total) by mouth at bedtime. 90 tablet 1  . spironolactone (ALDACTONE) 25 MG tablet Take 1 tablet (25 mg total) by mouth daily. 30 tablet 3  . Vitamin D, Ergocalciferol, (DRISDOL) 50000 UNITS CAPS capsule Take 1 capsule (50,000 Units total) by mouth every 7 (seven) days. (Patient taking  differently: Take 50,000 Units by mouth every 7 (seven) days. No specific day) 8 capsule 0  . ALPRAZolam (XANAX) 1 MG tablet Take 1 tablet (1 mg total) by mouth as needed (for cardiac MRI and claustrophobia). (Patient not taking: Reported on 01/22/2015) 2 tablet 0  . fexofenadine (ALLEGRA) 180 MG tablet Take 1 tablet (180 mg total) by mouth daily.     No current facility-administered medications for this encounter.    Filed Vitals:   01/22/15 0818  BP: 112/64  Pulse: 69  Weight: 199 lb 12 oz (90.606 kg)  SpO2: 98%    PHYSICAL EXAM:  General:  Well appearing. No resp difficulty HEENT: normal Neck: supple. JVP flat. Carotids 2+ bilaterally; no bruits. No lymphadenopathy or thryomegaly appreciated. Cor: PMI normal. Regular rate & rhythm. 2/6 SEM at RSB Lungs: clear Abdomen: obese, soft, nontender, nondistended. No hepatosplenomegaly. No bruits or masses. Good bowel sounds. Extremities: no cyanosis, clubbing, rash, edema Neuro: alert & orientedx3, cranial nerves grossly intact. Moves all 4 extremities w/o difficulty. Affect pleasant.   ASSESSMENT & PLAN:  1) Chronic systolic HF: Nonischemic cardiomyopathy.  EF 15-20% (ECHO 2/16). Normal coronaries on cath. Cardiac MRI with severely dilated LV, EF 29%, normal RV, moderate MR, no LGE (no evidence for infiltrative disease).  No heavy ETOH or illicit drugs.  No family history of cardiomyopathy.  Suspect viral cardiomyopathy.  NYHA class II symptoms, volume looks ok.  - Continue lasix as needed.  - Continue spironolactone 25 daily.   - Increase Coreg to 6.25 mg bid. - Continue digoxin 0.125 daily, recent level ok.  - Increase lisinopril to 2.5 mg bid.  - LifeVest off, cannot tolerate. - Echo in 6 wks, if EF remains low will need CRT-D.  - eventual sleep study - avoid ADD meds 2) LBBB: Chronic, as above will need CRT-D if EF does not improve.   Followup in 3 wks.   [name]  01/22/2015

## 2015-01-28 ENCOUNTER — Emergency Department (HOSPITAL_BASED_OUTPATIENT_CLINIC_OR_DEPARTMENT_OTHER)
Admission: EM | Admit: 2015-01-28 | Discharge: 2015-01-29 | Disposition: A | Discharge status: Home / Self Care | Payer: 59 | Attending: Emergency Medicine | Admitting: Emergency Medicine

## 2015-01-28 ENCOUNTER — Encounter (HOSPITAL_BASED_OUTPATIENT_CLINIC_OR_DEPARTMENT_OTHER): Payer: Self-pay | Admitting: *Deleted

## 2015-01-28 ENCOUNTER — Emergency Department (HOSPITAL_BASED_OUTPATIENT_CLINIC_OR_DEPARTMENT_OTHER): Payer: 59

## 2015-01-28 DIAGNOSIS — F329 Major depressive disorder, single episode, unspecified: Secondary | ICD-10-CM | POA: Diagnosis not present

## 2015-01-28 DIAGNOSIS — Z79899 Other long term (current) drug therapy: Secondary | ICD-10-CM | POA: Diagnosis not present

## 2015-01-28 DIAGNOSIS — Z880 Allergy status to penicillin: Secondary | ICD-10-CM | POA: Diagnosis not present

## 2015-01-28 DIAGNOSIS — K219 Gastro-esophageal reflux disease without esophagitis: Secondary | ICD-10-CM | POA: Diagnosis not present

## 2015-01-28 DIAGNOSIS — Z7951 Long term (current) use of inhaled steroids: Secondary | ICD-10-CM | POA: Insufficient documentation

## 2015-01-28 DIAGNOSIS — R635 Abnormal weight gain: Secondary | ICD-10-CM | POA: Diagnosis not present

## 2015-01-28 DIAGNOSIS — Z9889 Other specified postprocedural states: Secondary | ICD-10-CM | POA: Diagnosis not present

## 2015-01-28 DIAGNOSIS — R52 Pain, unspecified: Secondary | ICD-10-CM

## 2015-01-28 DIAGNOSIS — F419 Anxiety disorder, unspecified: Secondary | ICD-10-CM | POA: Insufficient documentation

## 2015-01-28 DIAGNOSIS — R079 Chest pain, unspecified: Secondary | ICD-10-CM | POA: Diagnosis present

## 2015-01-28 DIAGNOSIS — Z8619 Personal history of other infectious and parasitic diseases: Secondary | ICD-10-CM | POA: Diagnosis not present

## 2015-01-28 DIAGNOSIS — Z8639 Personal history of other endocrine, nutritional and metabolic disease: Secondary | ICD-10-CM | POA: Insufficient documentation

## 2015-01-28 LAB — BASIC METABOLIC PANEL
ANION GAP: 6 (ref 5–15)
BUN: 18 mg/dL (ref 6–23)
CALCIUM: 8.9 mg/dL (ref 8.4–10.5)
CO2: 26 mmol/L (ref 19–32)
CREATININE: 0.9 mg/dL (ref 0.50–1.10)
Chloride: 105 mmol/L (ref 96–112)
GFR calc Af Amer: 81 mL/min — ABNORMAL LOW (ref >90)
GFR calc non Af Amer: 70 mL/min — ABNORMAL LOW (ref >90)
Glucose, Bld: 121 mg/dL — ABNORMAL HIGH (ref 70–99)
Potassium: 3.7 mmol/L (ref 3.5–5.1)
Sodium: 137 mmol/L (ref 135–145)

## 2015-01-28 LAB — CBC
HCT: 37.2 % (ref 36.0–46.0)
Hemoglobin: 12.7 g/dL (ref 12.0–15.0)
MCH: 30.4 pg (ref 26.0–34.0)
MCHC: 34.1 g/dL (ref 30.0–36.0)
MCV: 89 fL (ref 78.0–100.0)
PLATELETS: 248 10*3/uL (ref 150–400)
RBC: 4.18 MIL/uL (ref 3.87–5.11)
RDW: 12.9 % (ref 11.5–15.5)
WBC: 7.8 10*3/uL (ref 4.0–10.5)

## 2015-01-28 LAB — TROPONIN I: Troponin I: <0.03 ng/mL (ref <0.031)

## 2015-01-28 NOTE — ED Notes (Signed)
Patient tearful with IV was placed.  Patient states her mother just died on 02/01/23 and that this along with being on unpaid leave from work as caused her a lot of stress.  Patient reports when crying that the pain in her chest becomes worse.  Reports this as feeling like a "knot" in her mid upper chest

## 2015-01-28 NOTE — ED Provider Notes (Signed)
CSN: [id number]     Arrival date & time 01/28/15  2200 History  This chart was scribed for Nemesis Rainwater, MD by Irene Pap, ED Scribe. This patient was seen in room MH12/MH12 and patient care was started at 11:17 PM.   Chief Complaint  Patient presents with  . medical evaluation    Patient is a 59 y.o. female presenting with chest pain. The history is provided by the patient. No language interpreter was used.  Chest Pain Pain location:  Substernal area Pain quality: aching   Pain quality comment:  And like there is a bubble inside that needs to come out Pain radiates to:  Does not radiate Pain radiates to the back: no   Pain severity:  Mild Onset quality:  Gradual Duration:  3 days Timing:  Constant Progression:  Waxing and waning Context: stress   Context: not breathing and no movement   Context comment:  Unpaid leave from work Relieved by:  Nothing Worsened by:  Nothing tried Ineffective treatments:  None tried Associated symptoms: no abdominal pain, no anorexia, no back pain, no diaphoresis, no dysphagia, no fatigue, no fever, no headache, no lower extremity edema, no nausea, no near-syncope, no palpitations, no PND and not vomiting   Risk factors: no diabetes mellitus     HPI Comments: Debra Holloway is a 59 y.o. female who presents to the Emergency Department complaining of a weird feeling in her chest. She states that she was very uncomfortable last night and today.  She states that she has gained 2.5 lbs in the past 24 hours, for which she has taken Lasix because she is concerned she is going into CHF.  She states she feels like there is a bubble in her upper chest and low neck and has had gas from certain foods. She states that she sees Dr. Sung Amabile and recently saw him for the weight gain.   Past Medical History  Diagnosis Date  . ADD (attention deficit disorder)   . Environmental allergies   . Chicken pox   . H/O vitamin D deficiency   . Seasonal affective disorder    . LBBB (left bundle branch block)     W/1st degree AV Block, Avoid Beta Blockers  . AR (allergic rhinitis)   . Depression   . Dyslipidemia    Past Surgical History  Procedure Laterality Date  . Dilation and curettage of uterus  2003  . Abdominal hysterectomy  2004  . Wisdom tooth extraction  1986  . Left and right heart catheterization with coronary angiogram N/A 11/17/2014    Procedure: LEFT AND RIGHT HEART CATHETERIZATION WITH CORONARY ANGIOGRAM;  Surgeon: Troy Sine, MD;  Location: [facility] CATH LAB;  Service: Cardiovascular;  Laterality: N/A;   Family History  Problem Relation Age of Onset  . COPD Mother     Living  . Pancreatic cancer Father 43    Deceased  . Lung cancer Paternal Grandfather   . Heart disease Paternal Grandmother   . Hypertension Paternal Grandmother   . Alzheimer's disease Maternal Grandfather   . Arthritis/Rheumatoid Maternal Grandmother   . Heart disease Maternal Grandmother     Tachycardia  . Cancer Paternal Uncle   . Breast cancer Sister 22  . Healthy Brother     x3  . Healthy Daughter     x3   History  Substance Use Topics  . Smoking status: Never Smoker   . Smokeless tobacco: Never Used  . Alcohol Use: 2.4 oz/week  4 Glasses of wine per week   OB History    No data available     Review of Systems  Constitutional: Positive for unexpected weight change. Negative for fever, diaphoresis and fatigue.  HENT: Negative for facial swelling and trouble swallowing.   Respiratory: Positive for chest tightness. Negative for choking and wheezing.   Cardiovascular: Positive for chest pain. Negative for palpitations, leg swelling, PND and near-syncope.  Gastrointestinal: Negative for nausea, vomiting, abdominal pain and anorexia.  Musculoskeletal: Negative for back pain and neck pain.  Neurological: Negative for headaches.  All other systems reviewed and are negative.  Allergies  Bee venom; Erythromycin; and Penicillins  Home Medications    Prior to Admission medications   Medication Sig Start Date End Date Taking? Authorizing Provider  ALPRAZolam Duanne Moron) 1 MG tablet Take 1 tablet (1 mg total) by mouth as needed (for cardiac MRI and claustrophobia). Patient not taking: Reported on 01/22/2015 01/05/15   Dorothy Spark, MD  carvedilol (COREG) 6.25 MG tablet Take 1 tablet (6.25 mg total) by mouth 2 (two) times daily with a meal. 01/22/15   Larey Dresser, MD  digoxin (LANOXIN) 0.125 MG tablet Take 1 tablet (0.125 mg total) by mouth daily. 12/07/14   Jolaine Artist, MD  fexofenadine (ALLEGRA) 180 MG tablet Take 1 tablet (180 mg total) by mouth daily. 11/30/14   Dorothy Spark, MD  fluticasone (FLONASE) 50 MCG/ACT nasal spray Place 1 spray into both nostrils daily.    Historical Provider, MD  furosemide (LASIX) 20 MG tablet Take 1 tablet (20 mg total) by mouth daily as needed (Weight gain 3 lbs or more over night.). 12/07/14   Jolaine Artist, MD  lisinopril (PRINIVIL,ZESTRIL) 2.5 MG tablet Take 1 tablet (2.5 mg total) by mouth 2 (two) times daily. 01/22/15   Larey Dresser, MD  montelukast (SINGULAIR) 10 MG tablet Take 1 tablet (10 mg total) by mouth at bedtime. 01/21/14   Brunetta Jeans, PA-C  spironolactone (ALDACTONE) 25 MG tablet Take 1 tablet (25 mg total) by mouth daily. 12/31/14   Jolaine Artist, MD  Vitamin D, Ergocalciferol, (DRISDOL) 50000 UNITS CAPS capsule Take 1 capsule (50,000 Units total) by mouth every 7 (seven) days. Patient taking differently: Take 50,000 Units by mouth every 7 (seven) days. No specific day 09/07/14   Brunetta Jeans, PA-C   BP 132/60 mmHg  Pulse 81  Temp(Src) 98.2 F (36.8 C) (Oral)  Resp 18  Ht 5\' 6"  (1.676 m)  Wt 200 lb (90.719 kg)  BMI 32.30 kg/m2  SpO2 99%   Physical Exam  Constitutional: She is oriented to person, place, and time. She appears well-developed and well-nourished. No distress.  HENT:  Head: Normocephalic and atraumatic.  Mouth/Throat: Oropharynx is clear and  moist.  Eyes: EOM are normal. Pupils are equal, round, and reactive to light.  Neck: Normal range of motion. Neck supple.  Cardiovascular: Normal rate, regular rhythm, normal heart sounds and intact distal pulses.   Pulmonary/Chest: Effort normal and breath sounds normal. No stridor. No respiratory distress. She has no wheezes. She has no rales. She exhibits no tenderness.  Abdominal: Soft. She exhibits no mass. There is no tenderness. There is no rebound and no guarding.  Hyperactive bowel sounds throughout most prominent in the epigastrum; palpable stool throughout transverse colon.   Musculoskeletal: Normal range of motion. She exhibits no edema or tenderness.  No edema of the ankles; 3+ dorsalis pedis, 3+ posterior tibialis.   Lymphadenopathy:  She has no cervical adenopathy.  Neurological: She is alert and oriented to person, place, and time. She has normal reflexes.  Skin: Skin is warm and dry. She is not diaphoretic.  Psychiatric: Her mood appears anxious.  Nursing note and vitals reviewed.   ED Course  Procedures (including critical care time) DIAGNOSTIC STUDIES: Oxygen Saturation is 99% on room air, normal by my interpretation.    COORDINATION OF CARE: 11:22 PM-Discussed treatment plan which includes labs and x-ray with pt at bedside and pt agreed to plan.   Labs Review Labs Reviewed - No data to display  Imaging Review No results found.   EKG Interpretation None      MDM   Final diagnoses:  None   Ruled out for an MI with 2 sets of enzymes, had a very low suspicion this was ACS to start with and given O2 saturation of 100% on room air and clear lung exam highly doubted CHF to start with.  No CHF on CXR.  Felt markedly better post GI cocktail.  I suspect this is a multi factorial problem.  1. She is very gassy on exam and suspect patient has some GERD, this explains the bubble she feels that is now gone post medication in the ED.  Moreover, she is very anxious in  the ED.  Suspect her leave from work is a source of this.  Moreover, notes indicate she told nurse CP increases with crying.  Follow up with Dr.  Sung Amabile and your PMD for recheck this week.  Return for any new or concerning symptoms.  Avoid stress will start protonix.    I personally performed the services described in this documentation, which was scribed in my presence. The recorded information has been reviewed and is accurate.    Veatrice Kells, MD 01/29/15 [phone]

## 2015-01-28 NOTE — ED Notes (Addendum)
Pt reports that she drove herself here because she got a strange feeling in her chest.  Pt very tearful, anxious.  Denies pain.  Reports that 'something just aint right'.  States that she is extremely stressed out due to the recent death of her mother.

## 2015-01-29 ENCOUNTER — Encounter (HOSPITAL_BASED_OUTPATIENT_CLINIC_OR_DEPARTMENT_OTHER): Payer: Self-pay | Admitting: Emergency Medicine

## 2015-01-29 LAB — TROPONIN I: Troponin I: <0.03 ng/mL (ref <0.031)

## 2015-01-29 MED ORDER — PANTOPRAZOLE SODIUM 40 MG PO TBEC: 40.0000 mg | DELAYED_RELEASE_TABLET | Freq: Every day | ORAL | Status: DC

## 2015-01-29 MED ORDER — GI COCKTAIL ~~LOC~~
30.0000 mL | Freq: Once | ORAL | Status: AC
  Administered 2015-01-29: 30 mL via ORAL
  Filled 2015-01-29: qty 30

## 2015-01-29 NOTE — ED Notes (Signed)
MD at bedside. 

## 2015-02-12 ENCOUNTER — Encounter (HOSPITAL_COMMUNITY): Payer: Self-pay | Admitting: *Deleted

## 2015-02-12 ENCOUNTER — Encounter (HOSPITAL_COMMUNITY): Payer: Self-pay

## 2015-02-12 ENCOUNTER — Ambulatory Visit (HOSPITAL_COMMUNITY)
Admission: RE | Admit: 2015-02-12 | Discharge: 2015-02-12 | Disposition: A | Discharge status: Home / Self Care | Payer: 59 | Source: Ambulatory Visit | Attending: Internal Medicine | Admitting: Internal Medicine

## 2015-02-12 ENCOUNTER — Telehealth (HOSPITAL_COMMUNITY): Payer: Self-pay | Admitting: Vascular Surgery

## 2015-02-12 VITALS — BP 103/69 | HR 85 | Resp 18 | Wt 202.5 lb

## 2015-02-12 DIAGNOSIS — I447 Left bundle-branch block, unspecified: Secondary | ICD-10-CM | POA: Insufficient documentation

## 2015-02-12 DIAGNOSIS — I429 Cardiomyopathy, unspecified: Secondary | ICD-10-CM | POA: Insufficient documentation

## 2015-02-12 DIAGNOSIS — E785 Hyperlipidemia, unspecified: Secondary | ICD-10-CM | POA: Diagnosis not present

## 2015-02-12 DIAGNOSIS — I5022 Chronic systolic (congestive) heart failure: Secondary | ICD-10-CM | POA: Insufficient documentation

## 2015-02-12 DIAGNOSIS — Z79899 Other long term (current) drug therapy: Secondary | ICD-10-CM | POA: Insufficient documentation

## 2015-02-12 MED ORDER — LISINOPRIL 2.5 MG PO TABS: 2.5000 mg | ORAL_TABLET | Freq: Every day | ORAL | Status: DC

## 2015-02-12 NOTE — Addendum Note (Signed)
Encounter addended by: Harvie Junior, CMA on: 02/12/2015 10:21 AM<BR>     Documentation filed: [employer], Orders

## 2015-02-12 NOTE — Patient Instructions (Signed)
You have been referred to Dr. Fransico Him for Sleep Study.   FOLLOW UP in 1 month.

## 2015-02-12 NOTE — Addendum Note (Signed)
Encounter addended by: Harvie Junior, CMA on: 02/12/2015 10:30 AM<BR>     Documentation filed: [employer], Orders

## 2015-02-12 NOTE — Addendum Note (Signed)
Encounter addended by: Harvie Junior, CMA on: 02/12/2015 10:18 AM<BR>     Documentation filed: Patient Instructions Section

## 2015-02-12 NOTE — Progress Notes (Signed)
Patient ID: Debra Holloway, female   DOB: 10/26/1955, 59 y.o.   MRN: [id number]  Primary Cardiologist: Meda Coffee  HPI: Debra Holloway is a 59 year old woman with h/o ADD, LBBB and depression. She was admitted to the hospital on 11/14/2014 with ADHF. 2-D echocardiogram with EF 15-20%. Underwent LHC which showed normal coronaries. While in the hospital she was experiencing significant hypotension and dosages of her meds had to be decreased significantly to the lowest possible doses. Before discharge and the hospital she experienced syncopal episode while being off telemetry and LifeVest placed. Lifevest removed due to multiple arms.  At last visit, Dr. Aundra Dubin increased lisinopril to 2.5 bid.   Also has had cMRI. 4/291/16 1. Severely dilated left ventricle with normal wall thickness and severely impaired systolic function (LVEF =53%). There is severe global hypokinesis with paradoxical septal motion and dyssynchronous LV wall contraction. 2. Normal right ventricular size, thickness and systolic function (RVEF = 51%) with no regional wall motion abnormalities. 3. Mildly dilated left atrium. 4. Moderate mitral and mild to moderate tricuspid regurgitation. 5. No late gadolinium enhancement was found in the myocardium of the left and right ventricle.   She returns for heart failure follow up. Feeling much better. Was unable to tolerate increase of lisinopril due to severe hypotension. Now only taking 2.5 daily. Had episode of CP and was seen in [facility] - felt to be anxiety. Denies SOB/PND/Orthopnea. SOB with steps. Taking all medications. Weight up 3 pounds. Says she can walk around as much as she wants.Gets tired when she is done but no DOE.  Labs 12/07/2014: K 4.4 Creatinine 0.88 HIV NR.  Labs 12/24/2014: K 4.2 Creatinine 0.91  Labs 4/16: K 4.2, creatinine 1.0, digoxin 0.4  ROS: All systems negative except as listed in HPI, PMH and Problem List.  Past Medical History  Diagnosis Date  . ADD  (attention deficit disorder)   . Environmental allergies   . Chicken pox   . H/O vitamin D deficiency   . Seasonal affective disorder   . LBBB (left bundle branch block)     W/1st degree AV Block, Avoid Beta Blockers  . AR (allergic rhinitis)   . Depression   . Dyslipidemia     SH:  History   Social History  . Marital Status: Legally Separated    Spouse Name: N/A  . Number of Children: N/A  . Years of Education: N/A   Occupational History  . Not on file.   Social History Main Topics  . Smoking status: Never Smoker   . Smokeless tobacco: Never Used  . Alcohol Use: 2.4 oz/week    4 Glasses of wine per week  . Drug Use: No  . Sexual Activity: Not on file   Other Topics Concern  . Not on file   Social History Narrative    FH:  Family History  Problem Relation Age of Onset  . COPD Mother     Living  . Pancreatic cancer Father 24    Deceased  . Lung cancer Paternal Grandfather   . Heart disease Paternal Grandmother   . Hypertension Paternal Grandmother   . Alzheimer's disease Maternal Grandfather   . Arthritis/Rheumatoid Maternal Grandmother   . Heart disease Maternal Grandmother     Tachycardia  . Cancer Paternal Uncle   . Breast cancer Sister 58  . Healthy Brother     x3  . Healthy Daughter     x3     Current Outpatient Prescriptions  Medication Sig Dispense Refill  .  ALPRAZolam (XANAX) 1 MG tablet Take 1 tablet (1 mg total) by mouth as needed (for cardiac MRI and claustrophobia). 2 tablet 0  . carvedilol (COREG) 6.25 MG tablet Take 1 tablet (6.25 mg total) by mouth 2 (two) times daily with a meal. 60 tablet 3  . digoxin (LANOXIN) 0.125 MG tablet Take 1 tablet (0.125 mg total) by mouth daily. 90 tablet 3  . fexofenadine (ALLEGRA) 180 MG tablet Take 1 tablet (180 mg total) by mouth daily.    . fluticasone (FLONASE) 50 MCG/ACT nasal spray Place 1 spray into both nostrils daily.    . furosemide (LASIX) 20 MG tablet Take 1 tablet (20 mg total) by mouth  daily as needed (Weight gain 3 lbs or more over night.). 15 tablet 3  . lisinopril (PRINIVIL,ZESTRIL) 2.5 MG tablet Take 1 tablet (2.5 mg total) by mouth 2 (two) times daily. (Patient taking differently: Take 2.5 mg by mouth daily. ) 60 tablet 3  . montelukast (SINGULAIR) 10 MG tablet Take 1 tablet (10 mg total) by mouth at bedtime. 90 tablet 1  . pantoprazole (PROTONIX) 40 MG tablet Take 1 tablet (40 mg total) by mouth daily. 30 tablet 0  . spironolactone (ALDACTONE) 25 MG tablet Take 1 tablet (25 mg total) by mouth daily. 30 tablet 3  . Vitamin D, Ergocalciferol, (DRISDOL) 50000 UNITS CAPS capsule Take 1 capsule (50,000 Units total) by mouth every 7 (seven) days. (Patient taking differently: Take 50,000 Units by mouth every 7 (seven) days. No specific day) 8 capsule 0   No current facility-administered medications for this encounter.    Filed Vitals:   02/12/15 0920  BP: 103/69  Pulse: 85  Resp: 18  Weight: 202 lb 8 oz (91.853 kg)  SpO2: 96%    PHYSICAL EXAM:  General:  Well appearing. No resp difficulty HEENT: normal Neck: supple. JVP flat. Carotids 2+ bilaterally; no bruits. No lymphadenopathy or thryomegaly appreciated. Cor: PMI normal. Regular rate & rhythm. 2/6 SEM at RSB Lungs: clear Abdomen: obese, soft, nontender, nondistended. No hepatosplenomegaly. No bruits or masses. Good bowel sounds. Extremities: no cyanosis, clubbing, rash, edema Neuro: alert & orientedx3, cranial nerves grossly intact. Moves all 4 extremities w/o difficulty. Affect pleasant.   ASSESSMENT & PLAN:  1) Chronic systolic HF due to NICM - EF 15-20% (ECHO 2/16). cMRI 4/16: EF 29% no scar. Normal coronaries on cath. Suspect viral CM. But could also be related to OSA or ADD meds - continues to improve. Now NYHA II. Volume status looks good. Continue lasix as needed.  - we reviewed cMRI. EF improving slowly. LVEF 29% no scar - Continue spiro to 25 mg daily  - Continue carvedilol to 6.25 mg twice a day.   -Continue digoxin 0.125 daily. Dig level 0.4 on 01/07/15 -Continue lisinopril 2.5 mg daily.  - EF improving. No scar. Will hold off on CRT-D for now check echo in 2 months,  - eventual sleep study - will schedule with Dr. Radford Pax - avoid ADD meds - can return to work as tolerated  2) LBBB  Yohan Samons,MD 9:56 AM

## 2015-02-12 NOTE — Telephone Encounter (Signed)
LEFT MESSAGE FOR SLEEP STUDY 03/04/15

## 2015-02-17 ENCOUNTER — Encounter (HOSPITAL_COMMUNITY): Payer: Self-pay

## 2015-02-17 ENCOUNTER — Telehealth (HOSPITAL_COMMUNITY): Payer: Self-pay

## 2015-02-17 NOTE — Telephone Encounter (Signed)
Patient called very tearful and upset that she did not feel well while at work.  Dr. Haroldine Laws saw her last Friday and let her go back to work full time "as tolerated".  Patient states she did well Monday and yesterday, however today she started feeling lightheaded, palpitations, and BP 148/89 after lunch.  Denies chest pain, shortness of breath, dizziness, or swelling.  Patient did forget to take her morning medications until lunch time and wonders if this may be a factor.  Patient also has history of anxiety and anxiety attacks.  Per Dr. Aundra Dubin who is present in clinic during phone conversation with patient, ok to write patient out for 1 week.  Will see if rest makes her symptoms better.  Advised patient to call us Thursday afternoon/Friday morning to update [place] on her condition.  If she feels no better, will schedule her to be seen in clinic.  Letter signed by Dr. Aundra Dubin sent to place of patient's work at provided fax # [phone].  Patient aware and agreeable to plan.  Renee Pain

## 2015-02-19 ENCOUNTER — Telehealth (HOSPITAL_COMMUNITY): Payer: Self-pay | Admitting: Cardiology

## 2015-02-19 NOTE — Telephone Encounter (Signed)
Pt  Called with complaints of chest pains "gallops" and ringing in ears

## 2015-02-23 ENCOUNTER — Encounter (HOSPITAL_COMMUNITY): Payer: Self-pay | Admitting: Cardiology

## 2015-02-23 ENCOUNTER — Ambulatory Visit (HOSPITAL_COMMUNITY)
Admission: RE | Admit: 2015-02-23 | Discharge: 2015-02-23 | Disposition: A | Discharge status: Home / Self Care | Payer: 59 | Source: Ambulatory Visit | Attending: Cardiology | Admitting: Cardiology

## 2015-02-23 VITALS — BP 96/68 | HR 66 | Wt 202.0 lb

## 2015-02-23 DIAGNOSIS — R5383 Other fatigue: Secondary | ICD-10-CM | POA: Diagnosis not present

## 2015-02-23 DIAGNOSIS — I447 Left bundle-branch block, unspecified: Secondary | ICD-10-CM | POA: Diagnosis not present

## 2015-02-23 DIAGNOSIS — I420 Dilated cardiomyopathy: Secondary | ICD-10-CM

## 2015-02-23 DIAGNOSIS — Z79899 Other long term (current) drug therapy: Secondary | ICD-10-CM | POA: Diagnosis not present

## 2015-02-23 DIAGNOSIS — F988 Other specified behavioral and emotional disorders with onset usually occurring in childhood and adolescence: Secondary | ICD-10-CM

## 2015-02-23 DIAGNOSIS — E785 Hyperlipidemia, unspecified: Secondary | ICD-10-CM | POA: Diagnosis not present

## 2015-02-23 DIAGNOSIS — F329 Major depressive disorder, single episode, unspecified: Secondary | ICD-10-CM | POA: Insufficient documentation

## 2015-02-23 DIAGNOSIS — I429 Cardiomyopathy, unspecified: Secondary | ICD-10-CM | POA: Diagnosis not present

## 2015-02-23 DIAGNOSIS — F909 Attention-deficit hyperactivity disorder, unspecified type: Secondary | ICD-10-CM

## 2015-02-23 DIAGNOSIS — I5022 Chronic systolic (congestive) heart failure: Secondary | ICD-10-CM | POA: Diagnosis not present

## 2015-02-23 NOTE — Progress Notes (Signed)
Patient ID: Debra Holloway, female   DOB: 1955-10-03, 59 y.o.   MRN: [id number] Primary Cardiologist: Meda Coffee  HPI: Debra Holloway is a 59 year old woman with h/o ADD, LBBB and depression. She was admitted to the hospital on 11/14/2014 with ADHF. 2-D echocardiogram demonstrates significantly reduced ejection fraction of around 15-20%.Underwent LHC which showed normal coronaries. While in the hospital she was experiencing significant hypotension and dosages of her meds had to be decreased significantly to the lowest possible doses. Before discharge and the hospital she experienced syncopal episode while being off telemetry and LifeVest palced.   Recently saw Dr. Meda Coffee c/o significant dizziness and presyncopal episodes and therefore her beta blocker was discontinued and the dose of lisinopril was decreased to 2.5 mg daily. The patient was started on Corlanor 5 mg twice a day. She called the on-call pager last week with tachypalpitations and presyncopal episode and severe flushing. She felt it was her corlanor and it was stopped. There was concern for VT but LifeVest hasn't fired. Now taking carvedilol 1.56 mg bid, lasix 20mg  daily and lisinopril 2.5 mg daily. Referred to HF clinic for further evaluation.  She returns for heart failure follow up. Last visit carvedilol and lisinopril  increased. Not able to tolerate lisinopril twice a day due to dizziness. Complaining of fatigue. Working full time at an [employer] but having difficulty off ADD meds. Tearful today talking about the death of her mom. . Having mild dyspnea. Denies PND/Orthopnea. Having anxiety due to Moms death. Taking all medications.   01/20/2015: CMRI- EF 29% RVEF 51%   Labs 12/07/2014: K 4.4 Creatinine 0.88 HIV NR Labs 01/28/2015: K 3.7 Creatinine 0.90.   ROS: All systems negative except as listed in HPI, PMH and Problem List.  Past Medical History  Diagnosis Date  . ADD (attention deficit disorder)   . Environmental allergies   . Chicken  pox   . H/O vitamin D deficiency   . Seasonal affective disorder   . LBBB (left bundle branch block)     W/1st degree AV Block, Avoid Beta Blockers  . AR (allergic rhinitis)   . Depression   . Dyslipidemia     SH:  History   Social History  . Marital Status: Legally Separated    Spouse Name: N/A  . Number of Children: N/A  . Years of Education: N/A   Occupational History  . Not on file.   Social History Main Topics  . Smoking status: Never Smoker   . Smokeless tobacco: Never Used  . Alcohol Use: 2.4 oz/week    4 Glasses of wine per week  . Drug Use: No  . Sexual Activity: Not on file   Other Topics Concern  . Not on file   Social History Narrative    FH:  Family History  Problem Relation Age of Onset  . COPD Mother     Living  . Pancreatic cancer Father 80    Deceased  . Lung cancer Paternal Grandfather   . Heart disease Paternal Grandmother   . Hypertension Paternal Grandmother   . Alzheimer's disease Maternal Grandfather   . Arthritis/Rheumatoid Maternal Grandmother   . Heart disease Maternal Grandmother     Tachycardia  . Cancer Paternal Uncle   . Breast cancer Sister 28  . Healthy Brother     x3  . Healthy Daughter     x3     Current Outpatient Prescriptions  Medication Sig Dispense Refill  . carvedilol (COREG) 6.25 MG tablet Take 1 tablet (6.25  mg total) by mouth 2 (two) times daily with a meal. 60 tablet 3  . digoxin (LANOXIN) 0.125 MG tablet Take 1 tablet (0.125 mg total) by mouth daily. 90 tablet 3  . fexofenadine (ALLEGRA) 180 MG tablet Take 1 tablet (180 mg total) by mouth daily.    . fluticasone (FLONASE) 50 MCG/ACT nasal spray Place 1 spray into both nostrils daily.    . furosemide (LASIX) 20 MG tablet Take 1 tablet (20 mg total) by mouth daily as needed (Weight gain 3 lbs or more over night.). 15 tablet 3  . lisinopril (PRINIVIL,ZESTRIL) 2.5 MG tablet Take 1 tablet (2.5 mg total) by mouth daily. 60 tablet 3  . montelukast (SINGULAIR) 10  MG tablet Take 1 tablet (10 mg total) by mouth at bedtime. 90 tablet 1  . pantoprazole (PROTONIX) 40 MG tablet Take 1 tablet (40 mg total) by mouth daily. 30 tablet 0  . spironolactone (ALDACTONE) 25 MG tablet Take 1 tablet (25 mg total) by mouth daily. 30 tablet 3  . Vitamin D, Ergocalciferol, (DRISDOL) 50000 UNITS CAPS capsule Take 1 capsule (50,000 Units total) by mouth every 7 (seven) days. (Patient taking differently: Take 50,000 Units by mouth every 7 (seven) days. No specific day) 8 capsule 0   No current facility-administered medications for this encounter.    Filed Vitals:   058/24/16 1552  BP: 96/68  Pulse: 66  Weight: 202 lb (91.627 kg)  SpO2: 98%    PHYSICAL EXAM:  General:  Tearful . No resp difficulty HEENT: normal Neck: supple. JVP flat. Carotids 2+ bilaterally; no bruits. No lymphadenopathy or thryomegaly appreciated. Cor: PMI normal. Regular rate & rhythm. 2/6 SEM at RSB Lungs: clear Abdomen: obese, soft, nontender, nondistended. No hepatosplenomegaly. No bruits or masses. Good bowel sounds. Extremities: no cyanosis, clubbing, rash, edema Neuro: alert & orientedx3, cranial nerves grossly intact. Moves all 4 extremities w/o difficulty. Affect pleasant.   ASSESSMENT & PLAN:  1) Chronic systolic HF due to NICM - EF 15-20% (ECHO 2/16). Normal coronaries on cath. Suspect viral CM. But could also be related to OSA or ADD meds. CMRI on April 20th showed EF 29% normal RV --she is NYHA II- III. Volume status stable. Continue lasix as needed.  - Continue spiro to 25 mg daily  - Continue carvedilol to 6.25 mg  mg twice a day. Will not up titrate with soft BP.  -Continue digoxin 0.125 daily -Continue lisinopril 2.5 mg daily a day.  Intolerant of 2.5 mg twice a day.  -Check ECHO next visit. - if EF remains low will need CRT-D.  -  avoid ADD meds - Tearful today will refer to HF SW for assistance with coping skills.  2) LBBB 3) Day time fatigue- Has sleep study next week.   4) Depression- Refer to HF SW  5) ADD- off ADD meds due to possible association with HF   Follow up in 3 weeks with an ECHO.  If EF remains low will need referral for CRT-D  Debra Sansom, NP-C 3:57 PM

## 2015-02-23 NOTE — Patient Instructions (Signed)
Your physician has requested that you have an echocardiogram. Echocardiography is a painless test that uses sound waves to create images of your heart. It provides your doctor with information about the size and shape of your heart and how well your heart's chambers and valves are working. This procedure takes approximately one hour. There are no restrictions for this procedure.  Your physician recommends that you schedule a follow-up appointment in: 3 weeks with echo

## 2015-02-25 ENCOUNTER — Telehealth: Payer: Self-pay | Admitting: Licensed Clinical Social Worker

## 2015-02-25 NOTE — Telephone Encounter (Signed)
CSW referred to assist with coping and support due to recent loss of her mother. Patient was very tearful on the phone and spoke of her mother and illness. Patient appears to being struggling with guilt and sadness over the loss. CSW provided supportive intervention and discussed coping mechanisms. Patient very open to support and willingness to try coping strategies. CSW will continue to be available as needed. Patient states she will return call to CSW if needed. Raquel Sarna, [place]

## 2015-03-04 ENCOUNTER — Ambulatory Visit (HOSPITAL_BASED_OUTPATIENT_CLINIC_OR_DEPARTMENT_OTHER): Payer: 59 | Attending: Internal Medicine | Admitting: *Deleted

## 2015-03-04 VITALS — Ht 66.5 in | Wt 200.0 lb

## 2015-03-04 DIAGNOSIS — G4733 Obstructive sleep apnea (adult) (pediatric): Secondary | ICD-10-CM | POA: Diagnosis not present

## 2015-03-04 DIAGNOSIS — I5022 Chronic systolic (congestive) heart failure: Secondary | ICD-10-CM | POA: Diagnosis not present

## 2015-03-10 NOTE — Telephone Encounter (Signed)
LATE ENTRY: Pt called with  C/o chest discomfort and felt she should be seen prior to her already scheduled appointment Pt states pains/anxiety increased while she was at work so she was sent home  pt seen at next available time

## 2015-03-15 ENCOUNTER — Encounter (HOSPITAL_COMMUNITY): Payer: 59

## 2015-03-15 ENCOUNTER — Encounter: Payer: Self-pay | Admitting: Physician Assistant

## 2015-03-18 ENCOUNTER — Encounter (HOSPITAL_COMMUNITY): Payer: Self-pay

## 2015-03-18 ENCOUNTER — Telehealth: Payer: Self-pay | Admitting: Cardiology

## 2015-03-18 ENCOUNTER — Encounter (HOSPITAL_BASED_OUTPATIENT_CLINIC_OR_DEPARTMENT_OTHER): Payer: Self-pay | Admitting: *Deleted

## 2015-03-18 ENCOUNTER — Ambulatory Visit (HOSPITAL_COMMUNITY)
Admission: RE | Admit: 2015-03-18 | Discharge: 2015-03-18 | Disposition: A | Discharge status: Home / Self Care | Payer: 59 | Source: Ambulatory Visit | Attending: Physician Assistant | Admitting: Physician Assistant

## 2015-03-18 ENCOUNTER — Ambulatory Visit (HOSPITAL_BASED_OUTPATIENT_CLINIC_OR_DEPARTMENT_OTHER)
Admission: RE | Admit: 2015-03-18 | Discharge: 2015-03-18 | Disposition: A | Discharge status: Home / Self Care | Payer: 59 | Source: Ambulatory Visit | Attending: Internal Medicine | Admitting: Internal Medicine

## 2015-03-18 VITALS — BP 110/68 | HR 80 | Wt 204.5 lb

## 2015-03-18 DIAGNOSIS — I509 Heart failure, unspecified: Secondary | ICD-10-CM | POA: Diagnosis not present

## 2015-03-18 DIAGNOSIS — I5022 Chronic systolic (congestive) heart failure: Secondary | ICD-10-CM

## 2015-03-18 DIAGNOSIS — I5021 Acute systolic (congestive) heart failure: Secondary | ICD-10-CM

## 2015-03-18 DIAGNOSIS — G4733 Obstructive sleep apnea (adult) (pediatric): Secondary | ICD-10-CM

## 2015-03-18 DIAGNOSIS — I447 Left bundle-branch block, unspecified: Secondary | ICD-10-CM | POA: Diagnosis not present

## 2015-03-18 HISTORY — DX: Obstructive sleep apnea (adult) (pediatric): G47.33

## 2015-03-18 MED ORDER — LISINOPRIL 2.5 MG PO TABS: 2.5000 mg | ORAL_TABLET | Freq: Two times a day (BID) | ORAL | Status: DC

## 2015-03-18 MED ORDER — POTASSIUM CHLORIDE ER 10 MEQ PO TBCR: EXTENDED_RELEASE_TABLET | ORAL | Status: DC

## 2015-03-18 MED ORDER — CARVEDILOL 6.25 MG PO TABS: 9.3750 mg | ORAL_TABLET | Freq: Two times a day (BID) | ORAL | Status: DC

## 2015-03-18 MED ORDER — FUROSEMIDE 20 MG PO TABS: ORAL_TABLET | ORAL | Status: DC

## 2015-03-18 NOTE — Telephone Encounter (Signed)
Please let patient know that they have severe sleep apnea and recommend CPAP titration. Please set up titration in the sleep lab. 

## 2015-03-18 NOTE — Progress Notes (Signed)
Patient ID: Debra Holloway, female   DOB: 1956-02-27, 59 y.o.   MRN: [id number] Primary Cardiologist: Meda Coffee  HPI: Debra Holloway is a 59 year old woman with h/o ADD, LBBB and depression. She was admitted to the hospital on 11/14/2014 with ADHF. 2-D echocardiogram demonstrates significantly reduced ejection fraction of around 15-20%.Underwent LHC which showed normal coronaries. While in the hospital she was experiencing significant hypotension and dosages of her meds had to be decreased significantly to the lowest possible doses. Before discharge and the hospital she experienced syncopal episode while being off telemetry and LifeVest palced.   Recently saw Dr. Meda Coffee c/o significant dizziness and presyncopal episodes and therefore her beta blocker was discontinued and the dose of lisinopril was decreased to 2.5 mg daily. The patient was started on Corlanor 5 mg twice a day. She called the on-call pager last week with tachypalpitations and presyncopal episode and severe flushing. She felt it was her corlanor and it was stopped. There was concern for VT but LifeVest hasn't fired. Now taking carvedilol 1.56 mg bid, lasix 20mg  daily and lisinopril 2.5 mg daily. Referred to HF clinic for further evaluation.  She returns for heart failure follow up. Previously intolerant of uptitrating lisinopril but pharmacy but it on bottle as bid and now taking 2.5 mg bid and tolerating well. Also on carvedilol 6.25 bid. Just got back from the beach. Feels good. Ankles a bit swollen. Says she has more trouble breathing with humidity. Gets SOB on stairs. Denies PND/Orthopnea. Weight up 5 pounds. Not taking lasix. Had sleep study 6/3 and it sounds like she failed but hasn't heard back.    01/20/2015: CMRI- EF 58% RVEF 51% + dyssynchrony. No scar or infiltrative disease.   Echo today EF ~30% with significant dyssynchrony    Labs 12/07/2014: K 4.4 Creatinine 0.88 HIV NR Labs 01/28/2015: K 3.7 Creatinine 0.90.   ROS: All systems  negative except as listed in HPI, PMH and Problem List.  Past Medical History  Diagnosis Date  . ADD (attention deficit disorder)   . Environmental allergies   . Chicken pox   . H/O vitamin D deficiency   . Seasonal affective disorder   . LBBB (left bundle branch block)     W/1st degree AV Block, Avoid Beta Blockers  . AR (allergic rhinitis)   . Depression   . Dyslipidemia     SH:  History   Social History  . Marital Status: Legally Separated    Spouse Name: N/A  . Number of Children: N/A  . Years of Education: N/A   Occupational History  . Not on file.   Social History Main Topics  . Smoking status: Never Smoker   . Smokeless tobacco: Never Used  . Alcohol Use: 2.4 oz/week    4 Glasses of wine per week  . Drug Use: No  . Sexual Activity: Not on file   Other Topics Concern  . Not on file   Social History Narrative    FH:  Family History  Problem Relation Age of Onset  . COPD Mother     Living  . Pancreatic cancer Father 65    Deceased  . Lung cancer Paternal Grandfather   . Heart disease Paternal Grandmother   . Hypertension Paternal Grandmother   . Alzheimer's disease Maternal Grandfather   . Arthritis/Rheumatoid Maternal Grandmother   . Heart disease Maternal Grandmother     Tachycardia  . Cancer Paternal Uncle   . Breast cancer Sister 30  . Healthy Brother  x3  . Healthy Daughter     x3     Current Outpatient Prescriptions  Medication Sig Dispense Refill  . carvedilol (COREG) 6.25 MG tablet Take 1 tablet (6.25 mg total) by mouth 2 (two) times daily with a meal. 60 tablet 3  . cholecalciferol (VITAMIN D) 1000 UNITS tablet Take 2,000 Units by mouth daily.    . digoxin (LANOXIN) 0.125 MG tablet Take 1 tablet (0.125 mg total) by mouth daily. 90 tablet 3  . fexofenadine (ALLEGRA) 180 MG tablet Take 1 tablet (180 mg total) by mouth daily.    . fluticasone (FLONASE) 50 MCG/ACT nasal spray Place 1 spray into both nostrils daily.    Marland Kitchen lisinopril  (PRINIVIL,ZESTRIL) 2.5 MG tablet Take 1 tablet (2.5 mg total) by mouth daily. (Patient taking differently: Take 2.5 mg by mouth 2 (two) times daily. ) 60 tablet 3  . spironolactone (ALDACTONE) 25 MG tablet Take 1 tablet (25 mg total) by mouth daily. 30 tablet 3  . furosemide (LASIX) 20 MG tablet Take 1 tablet (20 mg total) by mouth daily as needed (Weight gain 3 lbs or more over night.). (Patient not taking: Reported on 03/18/2015) 15 tablet 3  . pantoprazole (PROTONIX) 40 MG tablet Take 1 tablet (40 mg total) by mouth daily. (Patient not taking: Reported on 03/18/2015) 30 tablet 0   No current facility-administered medications for this encounter.    Filed Vitals:   03/18/15 1045  BP: 110/68  Pulse: 80  Weight: 204 lb 8 oz (92.761 kg)  SpO2: 98%    PHYSICAL EXAM:  General:  Tearful . No resp difficulty HEENT: normal Neck: supple. JVP flat. Carotids 2+ bilaterally; no bruits. No lymphadenopathy or thryomegaly appreciated. Cor: PMI normal. Regular rate & rhythm. 2/6 SEM at RSB Lungs: clear Abdomen: obese, soft, nontender, nondistended. No hepatosplenomegaly. No bruits or masses. Good bowel sounds. Extremities: no cyanosis, clubbing, rash, edema Neuro: alert & orientedx3, cranial nerves grossly intact. Moves all 4 extremities w/o difficulty. Affect pleasant.   ASSESSMENT & PLAN:  1) Chronic systolic HF due to NICM - EF 15-20% (ECHO 2/16). Normal coronaries on cath. Suspect viral CM. But could also be related to OSA or ADD meds. CMRI on April 20th showed EF 29% normal RV --she is NYHA II- III. Volume status up slightly. Will need to restart lasix. Have instructed her to take 20mg  2x/week (Mon and Thursday) and as needed for weight gain. Take with KCL 20 - Continue spiro to 25 mg daily  - Increase carvedilol to 9.375 mg  mg twice a day.  -Continue digoxin 0.125 daily -Continue lisinopril 2.5 mg bid. -EF slowly improving. Echo today EF read as 36%. I feel it is more like 30% with  significant dyssynchrony. Either way it is significantly improved since 2/16. I would argur for another 58 nmonths of medical therapy before proceeding with ICD. If she still needs ICD will need CRT-D as I think correcting dyssynchrony will help her LV function.  - Tearful today will refer to HF SW for assistance with coping skills.  2) LBBB - 164ms 3) Day time fatigue- Awaiting results of sleep study  4) Depression- Refer to HF SW  5) ADD- off ADD meds due to possible association with HF    Bensimhon, Quillian Quince, MD  11:14 AM

## 2015-03-18 NOTE — Addendum Note (Signed)
Encounter addended by: Kerry Dory, CMA on: 03/18/2015 11:52 AM<BR>     Documentation filed: D[employer], Patient Instructions Section, Orders

## 2015-03-18 NOTE — Patient Instructions (Signed)
INCREASE Carvedilol to 9.375 mg, one and one half tab twice a day CHANGE Lasix to 20 mg on Monday and Thursday START Potassium 20 MEQ every Monday and Thursday  Your physician recommends that you schedule a follow-up appointment in: 4 weeks  Do the following things EVERYDAY: 1) Weigh yourself in the morning before breakfast. Write it down and keep it in a log. 2) Take your medicines as prescribed 3) Eat low salt foods-Limit salt (sodium) to 2000 mg per day.  4) Stay as active as you can everyday 5) Limit all fluids for the day to less than 2 liters 6)

## 2015-03-18 NOTE — Sleep Study (Signed)
   NAME: Debra Holloway DATE OF BIRTH:  06/25/1956 MEDICAL RECORD NUMBER [id number]  LOCATION: [facility]  PHYSICIAN: Princes Finger R  DATE OF STUDY: 03/04/2015  SLEEP STUDY TYPE: Nocturnal Polysomnogram               REFERRING PHYSICIAN: Bensimhon, Shaune Pascal, MD  INDICATION FOR STUDY: Chronic systolic CHF, severe snoring, nonrestorative sleep, excessive daytime sleepiness  EPWORTH SLEEPINESS SCORE: 6 HEIGHT: 5' 6.5" (168.9 cm)  WEIGHT: 200 lb (90.719 kg)    Body mass index is 31.8 kg/(m^2).  NECK SIZE: 15 in.  MEDICATIONS: Reviewed in the chart  SLEEP ARCHITECTURE: The patient slept for a total fo 235 minutes out of a total sleep period time of 299 minutes.  There was 1 minute of slow wave sleep and 50 minutes of REM sleep.  The onset to sleep latency was prolonged at 112 minutes.  The onset to REM sleep latency was prolonged at 241 minutes.  The sleep efficiency was reduced at 57%.    RESPIRATORY DATA: There were 23 obstructive apneas and 94 hypopneas.  The total AHI was 30 events per hour consistent with severe obstructive sleep apnea/hypopnea syndrome.  Most events occurred in the supine position during NREM sleep.  There was mild snoring in the lateral position and moderate snoring in the supine position.    OXYGEN DATA: The average oxygen saturation was 94%.  The lowest oxygen saturation was 85%.  The time spen with oxygen saturations < 88% was 0.8 minutes.    CARDIAC DATA: The patient maintained sinus bradycardia with IVCD during the study.  The average heart rate was 59 BPM.  The lowest heart rate was 33 bpm and the fastest heart rate was 177 bpm.    MOVEMENT/PARASOMNIA: There were no periodic limb movement disorders and no REM sleep behavior disorders.  IMPRESSION/ RECOMMENDATION:   1.  Severe obstructive sleep apnea/hypopnea syndrome with an AHI of 30 events per hour.  Most events occurred in the supine position during NREM sleep. 2.  Reduced sleep  efficiency with increased frequency of awakenings secondary to respiratory events. 3.  Abnormal sleep architecture with no slow wave sleep and prolonged latency to REM sleep. 4.  Oxygen desaturations as low as 85% with respiratory events. 5.  Mild to moderate snoring was noted. 6.  Auto-titrating CPAP (continuous positive airway pressure) or a CPAP titration would be appropriate.  Treatment would also include careful attention to proper sleep hygiene, weight reduction due to elevated BME, avoidance of sleeping in the supine position and avoidance of alcohol within four hours of bedtime.  Specific treatment decisions should be tailored to each patient based upon the clinical situation and all treatment options should be considered.  The patient should be instructed to avoid driving if sleepy and careful clinical follow up is needed to ensure that the patient's symptoms are improving with therapy and the PAP adherence is supported and measured if prescribed.    Signed: Sueanne Margarita Diplomate, American Board of Sleep Medicine  ELECTRONICALLY SIGNED ON:  03/18/2015, 9:26 PM [place] PH: [phone]   FX: [phone] [place]

## 2015-03-18 NOTE — Progress Notes (Signed)
Advanced Heart Failure Medication Review by a Pharmacist  Does the patient  feel that his/her medications are working for him/her?  yes  Has the patient been experiencing any side effects to the medications prescribed?  no  Does the patient measure his/her own blood pressure or blood glucose at home?  no   Does the patient have any problems obtaining medications due to transportation or finances?   no  Understanding of regimen: good Understanding of indications: good Potential of compliance: excellent    Pharmacist comments: Patient presents to HF clinic and medications were reviewed with a pharmacist. She reports taking lisinopril 2.5mg  BID rather than daily, and that she has run out of her pantoprazole and furosemide (has been without this for a few weeks). No other medication-related questions at this time.   Tuan Tippin E. Leisha Trinkle, Pharm.D Clinical Pharmacy Resident Pager: [phone] 03/18/2015 10:57 AM

## 2015-03-18 NOTE — Progress Notes (Signed)
Echocardiogram 2D Echocardiogram has been performed.  Debra Holloway 03/18/2015, 10:00 AM

## 2015-03-19 ENCOUNTER — Other Ambulatory Visit: Payer: Self-pay | Admitting: *Deleted

## 2015-03-19 DIAGNOSIS — G4733 Obstructive sleep apnea (adult) (pediatric): Secondary | ICD-10-CM

## 2015-03-19 NOTE — Telephone Encounter (Signed)
Patient is aware of results. CPAP Titration scheduled for 03/26/15

## 2015-03-26 ENCOUNTER — Ambulatory Visit (HOSPITAL_BASED_OUTPATIENT_CLINIC_OR_DEPARTMENT_OTHER): Payer: 59 | Attending: Cardiology | Admitting: *Deleted

## 2015-03-26 VITALS — Ht 66.0 in | Wt 205.0 lb

## 2015-03-26 DIAGNOSIS — G4733 Obstructive sleep apnea (adult) (pediatric): Secondary | ICD-10-CM | POA: Diagnosis not present

## 2015-03-27 NOTE — Sleep Study (Signed)
<   6 hours study recorded d/t patient leaving AMA. Patient c/o feeling nauseated at 0200. She stated she became more and more anxious and "the more I tried not to think about the mask, the more I was aware of it, and the headgear felt like a helmet. The belts and wires just started to bother me". While patient was upset and contemplating leaving, tech offered patient to take a break and try again. Patient was crying and stated " no, I can't sleep with this on".  She was too "worked up". Patient calmed after wires were removed and had no physical complaints and no nausea upon leaving. She was only upset that she was unable to complete the study.  [employer], RPSGT

## 2015-03-28 ENCOUNTER — Telehealth: Payer: Self-pay | Admitting: Cardiology

## 2015-03-28 NOTE — Telephone Encounter (Signed)
Please let patient know that we are going to set her up for 2 week mask desensitization and then a 2 week auto CPAP titration from home.  Please set up OV with me in 12 weeks.  Order is in [facility] for [facility]

## 2015-03-28 NOTE — Addendum Note (Signed)
Addended by: Fransico Him R on: 03/28/2015 10:07 PM   Modules accepted: Orders

## 2015-03-28 NOTE — Sleep Study (Signed)
   NAME: Debra Holloway DATE OF BIRTH:  Dec 23, 1955 MEDICAL RECORD NUMBER [id number]  LOCATION: [facility]  PHYSICIAN: TURNER,TRACI R  DATE OF STUDY: 03/26/2015  SLEEP STUDY TYPE: Positive Airway Pressure Titration               REFERRING PHYSICIAN: Sueanne Margarita, MD  INDICATION FOR STUDY: OSA  EPWORTH SLEEPINESS SCORE: 6 HEIGHT: 5\' 6"  (167.6 cm)  WEIGHT: 205 lb (92.987 kg)    Body mass index is 33.1 kg/(m^2).  NECK SIZE: 15 in.  MEDICATIONS: Reviewed in the chart  SLEEP ARCHITECTURE: The patient slept for a total of 15 minutes out of a total recording time of 180 minutes.  There was no slow wave sleep or REM sleep.  The sleep efficiency was 8%.    RESPIRATORY DATA: The patient was started on CPAP at 5cm H2O and titrated for respiratory events and snoring to 6cm H2O. The patient was only able to sleep for 13 minutes at a pressure of 5cm H2O.  She complained of nausea and then became more anxious about the face mask.  She took a break but then said she could no longer sleep with the mask and left the sleep lab.  OXYGEN DATA: The average oxygen saturation was 98% and the lowest oxygen saturation was 96%.    CARDIAC DATA: The average heart rate was 47 bpm and the fastest heart rate was 79 bpm.  The lowest heart rate was 40 bpm.  MOVEMENT/PARASOMNIA: There were no periodic limb movement disorders or REM sleep behavior disorders noted.  IMPRESSION/ RECOMMENDATION:   1.  Unsucessful CPAP titration due to patient intolerance to the mask.   2.  Recommend ResMed Airfit nasal pillow mask with chin strap with 2 week mask desensitization and then home 2 week  Auto CPAP titration from 4 to 20cm H2O. 3.  Treatment would also include careful attention to proper sleep hygiene, weight reduction for elevated BMI, avoidance of sleeping in the supine position and avoidance of alcohol within four hours of bedtime.  Specific treatment decisions should be tailored to each patient  based upon the clinical situation and all treatment options should be considered.  The patient should be instructed to avoid driving if sleepy and careful clinical follow up is needed to ensure that the patient's symptoms are improving with therapy and the PAP adherence is supported and measured if prescribed.    Signed: Sueanne Margarita Diplomate, American Board of Sleep Medicine  ELECTRONICALLY SIGNED ON:  03/28/2015, 9:59 PM [place] PH: [phone]   FX: [phone] [place]

## 2015-03-30 NOTE — Telephone Encounter (Signed)
Patient is aware of results.

## 2015-03-30 NOTE — Telephone Encounter (Signed)
[facility] Has been notified of orders.  I have left a message for patient to call me back to give her the information

## 2015-04-01 ENCOUNTER — Other Ambulatory Visit (HOSPITAL_COMMUNITY): Payer: Self-pay | Admitting: *Deleted

## 2015-04-01 MED ORDER — PANTOPRAZOLE SODIUM 40 MG PO TBEC: 40.0000 mg | DELAYED_RELEASE_TABLET | Freq: Every day | ORAL | Status: DC

## 2015-04-06 ENCOUNTER — Other Ambulatory Visit (HOSPITAL_COMMUNITY): Payer: Self-pay | Admitting: *Deleted

## 2015-04-06 MED ORDER — PANTOPRAZOLE SODIUM 40 MG PO TBEC: 40.0000 mg | DELAYED_RELEASE_TABLET | Freq: Every day | ORAL | Status: DC

## 2015-04-15 ENCOUNTER — Encounter (HOSPITAL_COMMUNITY): Payer: Self-pay

## 2015-04-15 ENCOUNTER — Ambulatory Visit (HOSPITAL_COMMUNITY)
Admission: RE | Admit: 2015-04-15 | Discharge: 2015-04-15 | Disposition: A | Discharge status: Home / Self Care | Payer: 59 | Source: Ambulatory Visit | Attending: Cardiology | Admitting: Cardiology

## 2015-04-15 VITALS — BP 104/66 | HR 66 | Wt 206.5 lb

## 2015-04-15 DIAGNOSIS — G4733 Obstructive sleep apnea (adult) (pediatric): Secondary | ICD-10-CM | POA: Diagnosis not present

## 2015-04-15 DIAGNOSIS — I447 Left bundle-branch block, unspecified: Secondary | ICD-10-CM | POA: Diagnosis not present

## 2015-04-15 DIAGNOSIS — I5022 Chronic systolic (congestive) heart failure: Secondary | ICD-10-CM | POA: Insufficient documentation

## 2015-04-15 DIAGNOSIS — I5021 Acute systolic (congestive) heart failure: Secondary | ICD-10-CM

## 2015-04-15 DIAGNOSIS — I429 Cardiomyopathy, unspecified: Secondary | ICD-10-CM | POA: Diagnosis not present

## 2015-04-15 DIAGNOSIS — E785 Hyperlipidemia, unspecified: Secondary | ICD-10-CM | POA: Diagnosis not present

## 2015-04-15 DIAGNOSIS — Z79899 Other long term (current) drug therapy: Secondary | ICD-10-CM | POA: Diagnosis not present

## 2015-04-15 DIAGNOSIS — I420 Dilated cardiomyopathy: Secondary | ICD-10-CM

## 2015-04-15 LAB — DIGOXIN LEVEL: DIGOXIN LVL: 1.3 ng/mL (ref 0.8–2.0)

## 2015-04-15 LAB — BASIC METABOLIC PANEL
Anion gap: 8 (ref 5–15)
BUN: 20 mg/dL (ref 6–20)
CALCIUM: 10 mg/dL (ref 8.9–10.3)
CO2: 27 mmol/L (ref 22–32)
Chloride: 104 mmol/L (ref 101–111)
Creatinine, Ser: 0.98 mg/dL (ref 0.44–1.00)
GFR calc Af Amer: >60 mL/min (ref >60)
Glucose, Bld: 103 mg/dL — ABNORMAL HIGH (ref 65–99)
POTASSIUM: 4.7 mmol/L (ref 3.5–5.1)
SODIUM: 139 mmol/L (ref 135–145)

## 2015-04-15 LAB — BRAIN NATRIURETIC PEPTIDE: B NATRIURETIC PEPTIDE 5: 90.6 pg/mL (ref 0.0–100.0)

## 2015-04-15 MED ORDER — FUROSEMIDE 20 MG PO TABS: ORAL_TABLET | ORAL | Status: DC

## 2015-04-15 MED ORDER — POTASSIUM CHLORIDE ER 10 MEQ PO TBCR: EXTENDED_RELEASE_TABLET | ORAL | Status: DC

## 2015-04-15 MED ORDER — LISINOPRIL 5 MG PO TABS: 5.0000 mg | ORAL_TABLET | Freq: Two times a day (BID) | ORAL | Status: DC

## 2015-04-15 NOTE — Patient Instructions (Addendum)
LABS Today (bmet bnp dig)  INCREASE Lisinopril to 5mg  DAILY.  INCREASE Lasix to 20mg  EVERY OTHER DAY.   INCREASE Potassium to 100meq EVERY OTHER DAY.  LABS (bmet) in 2WEEKS.   FOLLOW UP in 1 MONTH.

## 2015-04-15 NOTE — Progress Notes (Signed)
Patient ID: Debra Holloway, female   DOB: [birth year], 59 y.o.   MRN: [id number] Primary Cardiologist: Meda Coffee  HPI: Debra Holloway is a 59 year old woman with h/o ADD, LBBB and depression. She was admitted to the hospital on 11/14/2014 with ADHF.  Echocardiogram demonstrated significantly reduced ejection fraction of around 15-20%. Underwent LHC which showed normal coronaries. While in the hospital she experienced significant hypotension and dosages of her meds had to be decreased significantly to the lowest possible doses.  Referred to HF clinic for further evaluation.  Now gradually uptitrating her CHF meds.    She has been found to have significant OSA but is still waiting to get CPAP.    She is short of breath and fatigued after walking 100 yards or swimming 2 laps in a short pool.  She is short of breath after walking up 1/2 flight steps.  Dyspnea carrying grocery bags.  Weight is up 2 lbs.     01/20/2015: CMRI- EF 29% RVEF 51% + dyssynchrony. No scar or infiltrative disease.  Echo (6/16):  EF ~30% with significant dyssynchrony, moderate MR  Labs 12/07/2014: K 4.4 Creatinine 0.88 HIV NR Labs 01/28/2015: K 3.7 Creatinine 0.90.   ROS: All systems negative except as listed in HPI, PMH and Problem List.  Past Medical History  Diagnosis Date  . ADD (attention deficit disorder)   . Environmental allergies   . Chicken pox   . H/O vitamin D deficiency   . Seasonal affective disorder   . LBBB (left bundle branch block)     W/1st degree AV Block, Avoid Beta Blockers  . AR (allergic rhinitis)   . Depression   . Dyslipidemia   . OSA (obstructive sleep apnea) 03/18/2015    Severe OSA with AHI 30/hr    SH:  History   Social History  . Marital Status: Legally Separated    Spouse Name: N/A  . Number of Children: N/A  . Years of Education: N/A   Occupational History  . Not on file.   Social History Main Topics  . Smoking status: Never Smoker   . Smokeless tobacco: Never Used  . Alcohol Use: 2.4  oz/week    4 Glasses of wine per week  . Drug Use: No  . Sexual Activity: Not on file   Other Topics Concern  . Not on file   Social History Narrative    FH:  Family History  Problem Relation Age of Onset  . COPD Mother     Living  . Pancreatic cancer Father 34    Deceased  . Lung cancer Paternal Grandfather   . Heart disease Paternal Grandmother   . Hypertension Paternal Grandmother   . Alzheimer's disease Maternal Grandfather   . Arthritis/Rheumatoid Maternal Grandmother   . Heart disease Maternal Grandmother     Tachycardia  . Cancer Paternal Uncle   . Breast cancer Sister 41  . Healthy Brother     x3  . Healthy Daughter     x3     Current Outpatient Prescriptions  Medication Sig Dispense Refill  . carvedilol (COREG) 6.25 MG tablet Take 1.5 tablets (9.375 mg total) by mouth 2 (two) times daily with a meal. 90 tablet 3  . cholecalciferol (VITAMIN D) 1000 UNITS tablet Take 2,000 Units by mouth daily.    . digoxin (LANOXIN) 0.125 MG tablet Take 1 tablet (0.125 mg total) by mouth daily. 90 tablet 3  . fexofenadine (ALLEGRA) 180 MG tablet Take 1 tablet (180 mg total) by mouth daily.    Marland Kitchen  fluticasone (FLONASE) 50 MCG/ACT nasal spray Place 1 spray into both nostrils daily.    . furosemide (LASIX) 20 MG tablet Take 1 tablet by mouth every other day 15 tablet 3  . lisinopril (PRINIVIL,ZESTRIL) 5 MG tablet Take 1 tablet (5 mg total) by mouth 2 (two) times daily. 60 tablet 3  . potassium chloride (K-DUR) 10 MEQ tablet Take 2 tablets by mouth every other day 30 tablet 3  . spironolactone (ALDACTONE) 25 MG tablet Take 1 tablet (25 mg total) by mouth daily. 30 tablet 3  . pantoprazole (PROTONIX) 40 MG tablet Take 1 tablet (40 mg total) by mouth daily. (Patient not taking: Reported on 04/15/2015) 30 tablet 3   No current facility-administered medications for this encounter.    Filed Vitals:   04/15/15 0920  BP: 104/66  Pulse: 66  Weight: 206 lb 8 oz (93.668 kg)  SpO2: 98%     PHYSICAL EXAM:  General:  Tearful . No resp difficulty HEENT: normal Neck: supple. JVP 8 cm. Carotids 2+ bilaterally; no bruits. No lymphadenopathy or thryomegaly appreciated. Cor: PMI normal. Regular rate & rhythm. 2/6 SEM at RSB Lungs: clear Abdomen: obese, soft, nontender, nondistended. No hepatosplenomegaly. No bruits or masses. Good bowel sounds. Extremities: no cyanosis, clubbing, rash, edema Neuro: alert & orientedx3, cranial nerves grossly intact. Moves all 4 extremities w/o difficulty. Affect pleasant.  ASSESSMENT & PLAN:  1) Chronic systolic HF: NICM. EF 15-20% by echo in 2/16, improved to about 30% in 6/16. Normal coronaries on cath. Possible viral CMP. cMRI in 4/16 did not show evidence for infiltrative disease.  She is NYHA II- III.  Possible mild volume overload. - Increase Lasix to 20 mg every other day and KCl to 20 mEq every other day.  - Continue spiro to 25 mg daily and Coreg 9.375 mg bid.   - Increase lisinopril to 5 mg bid.  -Continue digoxin 0.125 daily, check level.  - BMET/BNP today and repeat in 2 wks.  - EF slowly improving.  Given improvement, will repeat echo in 9/16.  If EF remains < 35%, plan CRT-D (LBBB). 2) LBBB: Chronic.  3) OSA: Working on getting CPAP.   [name], MD  04/15/2015

## 2015-04-16 ENCOUNTER — Telehealth (HOSPITAL_COMMUNITY): Payer: Self-pay | Admitting: *Deleted

## 2015-04-16 MED ORDER — DIGOXIN 125 MCG PO TABS: 0.0625 mg | ORAL_TABLET | Freq: Every day | ORAL | Status: DC

## 2015-04-16 NOTE — Telephone Encounter (Signed)
Notes Recorded by Scarlette Calico, RN on 04/16/2015 at 5:30 PM Pt aware and agreeable

## 2015-04-16 NOTE — Telephone Encounter (Signed)
-----   Message from Larey Dresser, MD sent at 04/16/2015  4:38 PM EDT ----- Digoxin level too high, decrease to 0.0625 daily and repeat digoxin level next visit.

## 2015-04-22 ENCOUNTER — Other Ambulatory Visit (HOSPITAL_COMMUNITY): Payer: Self-pay | Admitting: Internal Medicine

## 2015-04-22 NOTE — Telephone Encounter (Signed)
BENSIMHON REFILL. THANK YOU FOR YOUR TIME. 

## 2015-04-27 ENCOUNTER — Ambulatory Visit (HOSPITAL_COMMUNITY)
Admission: RE | Admit: 2015-04-27 | Discharge: 2015-04-27 | Disposition: A | Discharge status: Home / Self Care | Payer: 59 | Source: Ambulatory Visit | Attending: Cardiology | Admitting: Cardiology

## 2015-04-27 DIAGNOSIS — I5022 Chronic systolic (congestive) heart failure: Secondary | ICD-10-CM | POA: Diagnosis not present

## 2015-04-27 LAB — BASIC METABOLIC PANEL
Anion gap: 8 (ref 5–15)
BUN: 18 mg/dL (ref 6–20)
CO2: 24 mmol/L (ref 22–32)
CREATININE: 0.92 mg/dL (ref 0.44–1.00)
Calcium: 9.7 mg/dL (ref 8.9–10.3)
Chloride: 101 mmol/L (ref 101–111)
GFR calc Af Amer: >60 mL/min (ref >60)
GFR calc non Af Amer: >60 mL/min (ref >60)
Glucose, Bld: 102 mg/dL — ABNORMAL HIGH (ref 65–99)
Potassium: 4.7 mmol/L (ref 3.5–5.1)
Sodium: 133 mmol/L — ABNORMAL LOW (ref 135–145)

## 2015-04-27 LAB — DIGOXIN LEVEL: Digoxin Level: 0.4 ng/mL — ABNORMAL LOW (ref 0.8–2.0)

## 2015-04-28 ENCOUNTER — Other Ambulatory Visit (HOSPITAL_COMMUNITY): Payer: 59

## 2015-05-17 ENCOUNTER — Encounter (HOSPITAL_COMMUNITY): Payer: Self-pay

## 2015-05-17 ENCOUNTER — Ambulatory Visit (HOSPITAL_COMMUNITY)
Admission: RE | Admit: 2015-05-17 | Discharge: 2015-05-17 | Disposition: A | Discharge status: Home / Self Care | Payer: BLUE CROSS/BLUE SHIELD | Source: Ambulatory Visit | Attending: Cardiology | Admitting: Cardiology

## 2015-05-17 VITALS — BP 112/64 | HR 56 | Resp 18 | Wt 211.5 lb

## 2015-05-17 DIAGNOSIS — I5022 Chronic systolic (congestive) heart failure: Secondary | ICD-10-CM

## 2015-05-17 DIAGNOSIS — I447 Left bundle-branch block, unspecified: Secondary | ICD-10-CM | POA: Insufficient documentation

## 2015-05-17 DIAGNOSIS — Z79899 Other long term (current) drug therapy: Secondary | ICD-10-CM | POA: Insufficient documentation

## 2015-05-17 DIAGNOSIS — E785 Hyperlipidemia, unspecified: Secondary | ICD-10-CM | POA: Insufficient documentation

## 2015-05-17 DIAGNOSIS — I429 Cardiomyopathy, unspecified: Secondary | ICD-10-CM | POA: Insufficient documentation

## 2015-05-17 DIAGNOSIS — G4733 Obstructive sleep apnea (adult) (pediatric): Secondary | ICD-10-CM | POA: Insufficient documentation

## 2015-05-17 LAB — BASIC METABOLIC PANEL
Anion gap: 9 (ref 5–15)
BUN: 20 mg/dL (ref 6–20)
CHLORIDE: 102 mmol/L (ref 101–111)
CO2: 24 mmol/L (ref 22–32)
Calcium: 9.5 mg/dL (ref 8.9–10.3)
Creatinine, Ser: 0.84 mg/dL (ref 0.44–1.00)
GFR calc non Af Amer: >60 mL/min (ref >60)
Glucose, Bld: 104 mg/dL — ABNORMAL HIGH (ref 65–99)
POTASSIUM: 4.6 mmol/L (ref 3.5–5.1)
SODIUM: 135 mmol/L (ref 135–145)

## 2015-05-17 MED ORDER — LISINOPRIL 10 MG PO TABS: 10.0000 mg | ORAL_TABLET | Freq: Two times a day (BID) | ORAL | Status: DC

## 2015-05-17 NOTE — Addendum Note (Signed)
Encounter addended by: Effie Berkshire, RN on: 05/17/2015 11:25 AM<BR>     Documentation filed: Dx Association, Patient Instructions Section, Orders

## 2015-05-17 NOTE — Patient Instructions (Signed)
INCREASE Lisinopril to 10mg  twice daily.  Will schedule echocardiogram at [facility] in 1 month. Address: [address], [place], [place] [address]  Phone: [phone]  Follow up 6 weeks.  Do the following things EVERYDAY: 1) Weigh yourself in the morning before breakfast. Write it down and keep it in a log. 2) Take your medicines as prescribed 3) Eat low salt foods-Limit salt (sodium) to 2000 mg per day.  4) Stay as active as you can everyday 5) Limit all fluids for the day to less than 2 liters

## 2015-05-17 NOTE — Progress Notes (Signed)
Patient ID: Debra Holloway, female   DOB: 02-Mar-1956, 59 y.o.   MRN: [id number] Primary Cardiologist: Meda Coffee  HPI: Debra Holloway is a 59 year old woman with h/o ADD, LBBB and depression. She was admitted to the hospital on 11/14/2014 with ADHF.  Echocardiogram demonstrated significantly reduced ejection fraction of around 15-20%. Underwent LHC which showed normal coronaries. While in the hospital she experienced significant hypotension and dosages of her meds had to be decreased significantly to the lowest possible doses.  Referred to HF clinic for further evaluation.  Now gradually uptitrating her CHF meds.        Here for routine HF follow-up. At last visit lasix increased to every other day and lisinopril increased to 5 bid. Weight up a pound. Now feels great. Able to swim laps and go up and down steps without a problem even when carrying bags. No edema. Says she doesn't notice much difference. She has been found to have mild OSA. Got CPAP but can't tolerate.  01/20/2015: CMRI- EF 29% RVEF 51% + dyssynchrony. No scar or infiltrative disease.  Echo (6/16):  EF ~30% with significant dyssynchrony, moderate MR  Labs 12/07/2014: K 4.4 Creatinine 0.88 HIV NR Labs 01/28/2015: K 3.7 Creatinine 0.90.   ROS: All systems negative except as listed in HPI, PMH and Problem List.  Past Medical History  Diagnosis Date  . ADD (attention deficit disorder)   . Environmental allergies   . Chicken pox   . H/O vitamin D deficiency   . Seasonal affective disorder   . LBBB (left bundle branch block)     W/1st degree AV Block, Avoid Beta Blockers  . AR (allergic rhinitis)   . Depression   . Dyslipidemia   . OSA (obstructive sleep apnea) 03/18/2015    Severe OSA with AHI 30/hr    SH:  Social History   Social History  . Marital Status: Legally Separated    Spouse Name: N/A  . Number of Children: N/A  . Years of Education: N/A   Occupational History  . Not on file.   Social History Main Topics  . Smoking  status: Never Smoker   . Smokeless tobacco: Never Used  . Alcohol Use: 2.4 oz/week    4 Glasses of wine per week  . Drug Use: No  . Sexual Activity: Not on file   Other Topics Concern  . Not on file   Social History Narrative    FH:  Family History  Problem Relation Age of Onset  . COPD Mother     Living  . Pancreatic cancer Father 36    Deceased  . Lung cancer Paternal Grandfather   . Heart disease Paternal Grandmother   . Hypertension Paternal Grandmother   . Alzheimer's disease Maternal Grandfather   . Arthritis/Rheumatoid Maternal Grandmother   . Heart disease Maternal Grandmother     Tachycardia  . Cancer Paternal Uncle   . Breast cancer Sister 61  . Healthy Brother     x3  . Healthy Daughter     x3     Current Outpatient Prescriptions  Medication Sig Dispense Refill  . carvedilol (COREG) 6.25 MG tablet Take 1.5 tablets (9.375 mg total) by mouth 2 (two) times daily with a meal. 90 tablet 3  . cholecalciferol (VITAMIN D) 1000 UNITS tablet Take 2,000 Units by mouth daily.    . digoxin (LANOXIN) 0.125 MG tablet Take 0.5 tablets (0.0625 mg total) by mouth daily. 90 tablet 3  . fexofenadine (ALLEGRA) 180 MG tablet Take 1 tablet (  180 mg total) by mouth daily.    . fluticasone (FLONASE) 50 MCG/ACT nasal spray Place 1 spray into both nostrils daily.    . furosemide (LASIX) 20 MG tablet Take 1 tablet by mouth every other day 15 tablet 3  . lisinopril (PRINIVIL,ZESTRIL) 5 MG tablet Take 1 tablet (5 mg total) by mouth 2 (two) times daily. 60 tablet 3  . pantoprazole (PROTONIX) 40 MG tablet Take 1 tablet (40 mg total) by mouth daily. (Patient not taking: Reported on 04/15/2015) 30 tablet 3  . potassium chloride (K-DUR) 10 MEQ tablet Take 2 tablets by mouth every other day 30 tablet 3  . spironolactone (ALDACTONE) 25 MG tablet TAKE 1 TABLET(25 MG) BY MOUTH DAILY 30 tablet 6   No current facility-administered medications for this encounter.    Filed Vitals:   05/17/15 1109   BP: 112/64  Pulse: 56  Resp: 18  Weight: 211 lb 8 oz (95.936 kg)  SpO2: 100%    PHYSICAL EXAM:  General:  Feels great. No resp difficulty HEENT: normal Neck: supple. JVP 6 cm. Carotids 2+ bilaterally; no bruits. No lymphadenopathy or thryomegaly appreciated. Cor: PMI normal. Regular rate & rhythm. 2/6 SEM at RSB Lungs: clear Abdomen: obese, soft, nontender, nondistended. No hepatosplenomegaly. No bruits or masses. Good bowel sounds. Extremities: no cyanosis, clubbing, rash, edema Neuro: alert & orientedx3, cranial nerves grossly intact. Moves all 4 extremities w/o difficulty. Affect pleasant.  ASSESSMENT & PLAN:  1) Chronic systolic HF: NICM. EF 15-20% by echo in 2/16, improved to about 30% in 6/16. Normal coronaries on cath. Possible viral CMP. cMRI in 4/16 did not show evidence for infiltrative disease.  She is now NYHA  I-II. Volume looks much better.  - Continue current lasix dose, spiro 25 mg daily and Coreg 9.375 mg bid.  (HR too low to go higher) - Increase lisinopril to 10 mg bid. Consider Entresto at next visit if she tolerates.  -Continue digoxin 0.125 daily, - BMET/BNP today and repeat in 2 wks.  - EF slowly improving.  Given improvement, will repeat echo in 9/16.  If EF remains < 35%, plan CRT-D (LBBB). 2) LBBB: Chronic.  3) OSA: Mild. Unable to tolerate CPAP.  4) ADD: She continues to struggle with this. Will restart Vyvance (non-stimulant) but continue to hold Adderall .  Overall MUCH improved. Volume status looks good. Increase lisinopril today. Check labs and ReDS reading. Echo next month. RTC 6 weeks.   Glori Bickers, MD  05/17/2015

## 2015-05-18 ENCOUNTER — Telehealth: Payer: Self-pay | Admitting: Physician Assistant

## 2015-05-18 ENCOUNTER — Ambulatory Visit: Payer: 59 | Admitting: Physician Assistant

## 2015-05-18 NOTE — Telephone Encounter (Signed)
Pt called in to cancel 10:00am appt. I advised pt of possible no show fee $50. She said that she isn't going to pay it, she has allergies, and she'll find another doctor then hung up.

## 2015-05-18 NOTE — Telephone Encounter (Signed)
No charge but will charge if happens again.

## 2015-05-18 NOTE — Telephone Encounter (Signed)
Noted, pt called back again to ask when the $50 fee started being charged, I advised her that it is a possible charge and at the discretion of the provider. Pt stated again that she disagreed with the policy. She rescheduled for 05/21/15 10:00am.

## 2015-05-21 ENCOUNTER — Ambulatory Visit (INDEPENDENT_AMBULATORY_CARE_PROVIDER_SITE_OTHER): Payer: BLUE CROSS/BLUE SHIELD | Admitting: Physician Assistant

## 2015-05-21 ENCOUNTER — Encounter: Payer: Self-pay | Admitting: Physician Assistant

## 2015-05-21 VITALS — BP 90/62 | HR 74 | Temp 97.9°F | Ht 66.0 in | Wt 212.4 lb

## 2015-05-21 DIAGNOSIS — F909 Attention-deficit hyperactivity disorder, unspecified type: Secondary | ICD-10-CM | POA: Diagnosis not present

## 2015-05-21 DIAGNOSIS — F988 Other specified behavioral and emotional disorders with onset usually occurring in childhood and adolescence: Secondary | ICD-10-CM

## 2015-05-21 NOTE — Progress Notes (Signed)
Patient presents to clinic today to discuss restarting medications for ADD now that she has been given clearance by Cardiology. Patient with cardiomyopathy with echo revealing EF at 30-36%. Is followed very closely by Dr. Sung Amabile. States she is having quite a bit of an issue with focus now that work has restarted. Previously well controlled on combination of Vyvanse with PRN afternoon Adderall.  Denies prior side effects of medication.  Past Medical History  Diagnosis Date  . ADD (attention deficit disorder)   . Environmental allergies   . Chicken pox   . H/O vitamin D deficiency   . Seasonal affective disorder   . LBBB (left bundle branch block)     W/1st degree AV Block, Avoid Beta Blockers  . AR (allergic rhinitis)   . Depression   . Dyslipidemia   . OSA (obstructive sleep apnea) 03/18/2015    Severe OSA with AHI 30/hr    Current Outpatient Prescriptions on File Prior to Visit  Medication Sig Dispense Refill  . carvedilol (COREG) 6.25 MG tablet Take 1.5 tablets (9.375 mg total) by mouth 2 (two) times daily with a meal. 90 tablet 3  . cholecalciferol (VITAMIN D) 1000 UNITS tablet Take 2,000 Units by mouth daily.    . digoxin (LANOXIN) 0.125 MG tablet Take 0.5 tablets (0.0625 mg total) by mouth daily. 90 tablet 3  . fexofenadine (ALLEGRA) 180 MG tablet Take 1 tablet (180 mg total) by mouth daily.    . fluticasone (FLONASE) 50 MCG/ACT nasal spray Place 1 spray into both nostrils daily.    . furosemide (LASIX) 20 MG tablet Take 1 tablet by mouth every other day 15 tablet 3  . lisinopril (PRINIVIL,ZESTRIL) 10 MG tablet Take 1 tablet (10 mg total) by mouth 2 (two) times daily. 60 tablet 6  . pantoprazole (PROTONIX) 40 MG tablet Take 1 tablet (40 mg total) by mouth daily. 30 tablet 3  . potassium chloride (K-DUR) 10 MEQ tablet Take 2 tablets by mouth every other day 30 tablet 3  . spironolactone (ALDACTONE) 25 MG tablet TAKE 1 TABLET(25 MG) BY MOUTH DAILY 30 tablet 6   No current  facility-administered medications on file prior to visit.    Allergies  Allergen Reactions  . Bee Venom Anaphylaxis  . Erythromycin Itching and Rash  . Penicillins Hives, Itching and Rash    Family History  Problem Relation Age of Onset  . COPD Mother     Living  . Pancreatic cancer Father 58    Deceased  . Lung cancer Paternal Grandfather   . Heart disease Paternal Grandmother   . Hypertension Paternal Grandmother   . Alzheimer's disease Maternal Grandfather   . Arthritis/Rheumatoid Maternal Grandmother   . Heart disease Maternal Grandmother     Tachycardia  . Cancer Paternal Uncle   . Breast cancer Sister 82  . Healthy Brother     x3  . Healthy Daughter     x3    Social History   Social History  . Marital Status: Legally Separated    Spouse Name: N/A  . Number of Children: N/A  . Years of Education: N/A   Social History Main Topics  . Smoking status: Never Smoker   . Smokeless tobacco: Never Used  . Alcohol Use: 2.4 oz/week    4 Glasses of wine per week  . Drug Use: No  . Sexual Activity: Not Asked   Other Topics Concern  . None   Social History Narrative    Review of  Systems - See HPI.  All other ROS are negative.  BP 90/62 mmHg  Pulse 74  Temp(Src) 97.9 F (36.6 C) (Oral)  Ht _0  (1.676 m)  Wt 212 lb 6.4 oz (96.344 kg)  BMI 34.30 kg/m2  SpO2 98%  Physical Exam  Constitutional: She is oriented to person, place, and time and well-developed, well-nourished, and in no distress.  HENT:  Head: Normocephalic and atraumatic.  Cardiovascular: Normal rate, regular rhythm, normal heart sounds and intact distal pulses.   Pulmonary/Chest: Effort normal and breath sounds normal. No respiratory distress. She has no wheezes. She has no rales. She exhibits no tenderness.  Neurological: She is alert and oriented to person, place, and time.  Skin: Skin is warm and dry. No rash noted.  Psychiatric: Affect normal.  Vitals reviewed.   Recent Results (from  the past 2160 hour(s))  Basic metabolic panel     Status: Abnormal   Collection Time: 04/15/15 10:00 AM  Result Value Ref Range   Sodium 139 135 - 145 mmol/L   Potassium 4.7 3.5 - 5.1 mmol/L   Chloride 104 101 - 111 mmol/L   CO2 27 22 - 32 mmol/L   Glucose, Bld 103 (H) 65 - 99 mg/dL   BUN 20 6 - 20 mg/dL   Creatinine, Ser 0.98 0.44 - 1.00 mg/dL   Calcium 10.0 8.9 - 10.3 mg/dL   GFR calc non Af Amer >60 >60 mL/min   GFR calc Af Amer >60 >60 mL/min    Comment: (NOTE) The eGFR has been calculated using the CKD EPI equation. This calculation has not been validated in all clinical situations. eGFR's persistently <60 mL/min signify possible Chronic Kidney Disease.    Anion gap 8 5 - 15  B Nat Peptide     Status: None   Collection Time: 04/15/15 10:00 AM  Result Value Ref Range   B Natriuretic Peptide 90.6 0.0 - 100.0 pg/mL  Digoxin level     Status: None   Collection Time: 04/15/15 10:00 AM  Result Value Ref Range   Digoxin Level 1.3 0.8 - 2.0 ng/mL  Basic metabolic panel     Status: Abnormal   Collection Time: 04/27/15 10:45 AM  Result Value Ref Range   Sodium 133 (L) 135 - 145 mmol/L   Potassium 4.7 3.5 - 5.1 mmol/L   Chloride 101 101 - 111 mmol/L   CO2 24 22 - 32 mmol/L   Glucose, Bld 102 (H) 65 - 99 mg/dL   BUN 18 6 - 20 mg/dL   Creatinine, Ser 0.92 0.44 - 1.00 mg/dL   Calcium 9.7 8.9 - 10.3 mg/dL   GFR calc non Af Amer >60 >60 mL/min   GFR calc Af Amer >60 >60 mL/min    Comment: (NOTE) The eGFR has been calculated using the CKD EPI equation. This calculation has not been validated in all clinical situations. eGFR's persistently <60 mL/min signify possible Chronic Kidney Disease.    Anion gap 8 5 - 15  Digoxin level     Status: Abnormal   Collection Time: 04/27/15 10:45 AM  Result Value Ref Range   Digoxin Level 0.4 (L) 0.8 - 2.0 ng/mL  Basic metabolic panel     Status: Abnormal   Collection Time: 05/17/15 11:46 AM  Result Value Ref Range   Sodium 135 135 - 145  mmol/L   Potassium 4.6 3.5 - 5.1 mmol/L   Chloride 102 101 - 111 mmol/L   CO2 24 22 - 32 mmol/L  Glucose, Bld 104 (H) 65 - 99 mg/dL   BUN 20 6 - 20 mg/dL   Creatinine, Ser 0.84 0.44 - 1.00 mg/dL   Calcium 9.5 8.9 - 10.3 mg/dL   GFR calc non Af Amer >60 >60 mL/min   GFR calc Af Amer >60 >60 mL/min    Comment: (NOTE) The eGFR has been calculated using the CKD EPI equation. This calculation has not been validated in all clinical situations. eGFR's persistently <60 mL/min signify possible Chronic Kidney Disease.    Anion gap 9 5 - 15    Assessment/Plan: ADD (attention deficit disorder) Reviewed Cardiologist note which states no resumption of Adderall but can restart Vyvanse due to being a "non-stimulant". As this is not accurate, will contact cardiology for discussion before restarting any medications. Staff message sent as unable to reach by phone.  Will call patient once specialist has been consulted. CSC updated today.

## 2015-05-21 NOTE — Assessment & Plan Note (Signed)
Reviewed Cardiologist note which states no resumption of Adderall but can restart Vyvanse due to being a "non-stimulant". As this is not accurate, will contact cardiology for discussion before restarting any medications. Staff message sent as unable to reach by phone.  Will call patient once specialist has been consulted. CSC updated today.

## 2015-05-21 NOTE — Progress Notes (Signed)
Pre visit review using our clinic review tool, if applicable. No additional management support is needed unless otherwise documented below in the visit note. 

## 2015-05-24 ENCOUNTER — Telehealth: Payer: Self-pay | Admitting: Physician Assistant

## 2015-05-24 MED ORDER — LISDEXAMFETAMINE DIMESYLATE 40 MG PO CAPS: 40.0000 mg | ORAL_CAPSULE | ORAL | Status: DC

## 2015-05-24 NOTE — Telephone Encounter (Signed)
Patient informed, understood & agreed/SLS  

## 2015-05-24 NOTE — Telephone Encounter (Signed)
Please inform patient that I have spoken with Cardiology regarding clarification on Vyvanse and he has given clearance to restart. Her medication is at the front desk for pickup. Will follow-up 1 month after restarting.

## 2015-05-26 ENCOUNTER — Telehealth: Payer: Self-pay | Admitting: *Deleted

## 2015-05-26 NOTE — Telephone Encounter (Signed)
Prior auth for Vyvanse approved effective from 05/26/2015 through 05/24/2016. JG//CMA

## 2015-06-03 ENCOUNTER — Encounter: Payer: Self-pay | Admitting: Cardiology

## 2015-06-17 ENCOUNTER — Other Ambulatory Visit (HOSPITAL_COMMUNITY): Payer: Self-pay | Admitting: Internal Medicine

## 2015-06-17 ENCOUNTER — Other Ambulatory Visit: Payer: Self-pay

## 2015-06-17 ENCOUNTER — Ambulatory Visit (HOSPITAL_COMMUNITY): Payer: BLUE CROSS/BLUE SHIELD | Attending: Cardiology

## 2015-06-17 DIAGNOSIS — I509 Heart failure, unspecified: Secondary | ICD-10-CM | POA: Diagnosis not present

## 2015-06-17 DIAGNOSIS — I5022 Chronic systolic (congestive) heart failure: Secondary | ICD-10-CM

## 2015-06-17 DIAGNOSIS — E785 Hyperlipidemia, unspecified: Secondary | ICD-10-CM | POA: Insufficient documentation

## 2015-06-28 ENCOUNTER — Ambulatory Visit (HOSPITAL_COMMUNITY)
Admission: RE | Admit: 2015-06-28 | Discharge: 2015-06-28 | Disposition: A | Discharge status: Home / Self Care | Payer: BLUE CROSS/BLUE SHIELD | Source: Ambulatory Visit | Attending: Cardiology | Admitting: Cardiology

## 2015-06-28 VITALS — BP 122/70 | HR 71 | Wt 215.0 lb

## 2015-06-28 DIAGNOSIS — E559 Vitamin D deficiency, unspecified: Secondary | ICD-10-CM | POA: Diagnosis not present

## 2015-06-28 DIAGNOSIS — I5022 Chronic systolic (congestive) heart failure: Secondary | ICD-10-CM | POA: Diagnosis not present

## 2015-06-28 DIAGNOSIS — I447 Left bundle-branch block, unspecified: Secondary | ICD-10-CM

## 2015-06-28 DIAGNOSIS — Z79899 Other long term (current) drug therapy: Secondary | ICD-10-CM | POA: Insufficient documentation

## 2015-06-28 DIAGNOSIS — G4733 Obstructive sleep apnea (adult) (pediatric): Secondary | ICD-10-CM | POA: Insufficient documentation

## 2015-06-28 DIAGNOSIS — E785 Hyperlipidemia, unspecified: Secondary | ICD-10-CM | POA: Diagnosis not present

## 2015-06-28 DIAGNOSIS — I428 Other cardiomyopathies: Secondary | ICD-10-CM | POA: Diagnosis not present

## 2015-06-28 NOTE — Patient Instructions (Signed)
Do the following things EVERYDAY: °1) Weigh yourself in the morning before breakfast. Write it down and keep it in a log. °2) Take your medicines as prescribed °3) Eat low salt foods--Limit salt (sodium) to 2000 mg per day.  °4) Stay as active as you can everyday °5) Limit all fluids for the day to less than 2 liters °

## 2015-06-28 NOTE — Progress Notes (Signed)
Advanced Heart Failure Clinic Note   Patient ID: Debra Holloway, female   DOB: [birth year], 59 y.o.   MRN: [id number] Primary Cardiologist: Meda Coffee  HPI: Debra Holloway is a 59 year old woman with h/o ADD, LBBB and depression. She was admitted to the hospital on 11/14/2014 with ADHF.  Echocardiogram demonstrated significantly reduced ejection fraction of around 15-20%. Underwent LHC which showed normal coronaries. While in the hospital she experienced significant hypotension and dosages of her meds had to be decreased significantly to the lowest possible doses.  Referred to HF clinic for further evaluation.  Now gradually uptitrating her CHF meds.        She presents today for  HF follow-up. At last visit increased lisinopril to 10 bid. Weight up 3 lbs from last visit. Taking lasix every other day but doesn't seem to pee more when she takes it. Still feels great. Not having any exertional SOB.  No problem with stairs. She is discouraged that her Echo went from 30% to 25-30%. No edema. Did not tolerate her CPAP.  Not lightheaded or dizzy. Does have a hx of vertigo, but not limiting.  01/20/2015: CMRI- EF 29% RVEF 51% + dyssynchrony. No scar or infiltrative disease.  Echo (6/16):  EF ~30% with significant dyssynchrony, moderate MR Echo (9/16):  EF 25-30%, Grade 1 DD  Labs 3/16: K 4.4 Creatinine 0.88 HIV NR Labs 4/16: K 3.7 Creatinine 0.90.  Labs 8/16: K 4.6 Creatinine 0.84  ROS: All systems negative except as listed in HPI, PMH and Problem List.  Past Medical History  Diagnosis Date  . ADD (attention deficit disorder)   . Environmental allergies   . Chicken pox   . H/O vitamin D deficiency   . Seasonal affective disorder   . LBBB (left bundle branch block)     W/1st degree AV Block, Avoid Beta Blockers  . AR (allergic rhinitis)   . Depression   . Dyslipidemia   . OSA (obstructive sleep apnea) 03/18/2015    Severe OSA with AHI 30/hr    SH:  Social History   Social History  . Marital Status:  Legally Separated    Spouse Name: N/A  . Number of Children: N/A  . Years of Education: N/A   Occupational History  . Not on file.   Social History Main Topics  . Smoking status: Never Smoker   . Smokeless tobacco: Never Used  . Alcohol Use: 2.4 oz/week    4 Glasses of wine per week  . Drug Use: No  . Sexual Activity: Not on file   Other Topics Concern  . Not on file   Social History Narrative    FH:  Family History  Problem Relation Age of Onset  . COPD Mother     Living  . Pancreatic cancer Father 36    Deceased  . Lung cancer Paternal Grandfather   . Heart disease Paternal Grandmother   . Hypertension Paternal Grandmother   . Alzheimer's disease Maternal Grandfather   . Arthritis/Rheumatoid Maternal Grandmother   . Heart disease Maternal Grandmother     Tachycardia  . Cancer Paternal Uncle   . Breast cancer Sister 68  . Healthy Brother     x3  . Healthy Daughter     x3     Current Outpatient Prescriptions  Medication Sig Dispense Refill  . carvedilol (COREG) 6.25 MG tablet Take 1.5 tablets (9.375 mg total) by mouth 2 (two) times daily with a meal. 90 tablet 3  . cholecalciferol (VITAMIN D) 1000 UNITS  tablet Take 2,000 Units by mouth daily.    . digoxin (LANOXIN) 0.125 MG tablet Take 0.5 tablets (0.0625 mg total) by mouth daily. 90 tablet 3  . fexofenadine (ALLEGRA) 180 MG tablet Take 1 tablet (180 mg total) by mouth daily.    . fluticasone (FLONASE) 50 MCG/ACT nasal spray Place 1 spray into both nostrils daily.    . furosemide (LASIX) 20 MG tablet Take 1 tablet by mouth every other day 15 tablet 3  . lisdexamfetamine (VYVANSE) 40 MG capsule Take 1 capsule (40 mg total) by mouth every morning. 30 capsule 0  . lisinopril (PRINIVIL,ZESTRIL) 10 MG tablet Take 1 tablet (10 mg total) by mouth 2 (two) times daily. 60 tablet 6  . pantoprazole (PROTONIX) 40 MG tablet Take 1 tablet (40 mg total) by mouth daily. 30 tablet 3  . potassium chloride (K-DUR) 10 MEQ tablet  Take 2 tablets by mouth every other day 30 tablet 3  . spironolactone (ALDACTONE) 25 MG tablet TAKE 1 TABLET(25 MG) BY MOUTH DAILY 30 tablet 6   No current facility-administered medications for this encounter.    Filed Vitals:   06/28/15 1027  BP: 122/70  Pulse: 71  Weight: 215 lb (97.523 kg)  SpO2: 97%    PHYSICAL EXAM:  General:  No resp difficulty. HEENT: normal Neck: supple. JVP 6-7 cm. Carotids 2+ bilaterally; no bruits. No lymphadenopathy or thryomegaly. Cor: PMI normal. Regular rate & rhythm. 2/6 SEM at RSB Lungs: CTA Abdomen: obese, soft, NT, ND. No hepatosplenomegaly. No bruits or masses. Good bowel sounds. Extremities: no cyanosis, clubbing, rash, edema Neuro: alert & orientedx3, cranial nerves grossly intact. Moves all 4 extremities w/o difficulty. Affect pleasant.  ASSESSMENT & PLAN:  1) Chronic systolic HF: NICM. EF 25-30% by echo in 9/16, stable from 6/16 of about 30%. Normal coronaries on cath. Possible viral CMP. cMRI in 4/16 did not show evidence for infiltrative disease.  She is NYHA  I-II. Volume status stable - Continue current lasix dose, spiro 25 mg daily and Coreg 9.375 mg bid.  (HR too low to go higher) - Continue lisinopril 10 mg bid. Consider Entresto at next visit - Continue digoxin 0.125 daily 2) LBBB: Chronic.  3) OSA: Mild. Unable to tolerate CPAP.  4) ADD: She continues to struggle with this. Continue Vyvance but continue to hold Adderall .  Stable. Volume status looks good and not currently having symptoms.    With persistent EF < 35%  Despite OMT and with LBBB need to consider CRT-D. She has questions about this. Refer to EP to see Dr Caryl Comes.  Follow up 2 months.  Shirley Friar, PA-C  06/28/2015  I spoke with the patient, talked about how though her echo was not improving, the improvement of her symptoms should be encouraging.  Prior to Dr. Haroldine Laws seeing her as well, she left the office. Assessment and plan discussed with Dr.  Haroldine Laws who agrees. He was able to contact her via phone and go over her ECHO and she also agreed with the Assessment and plan.  Legrand Como [address]" [place], PA-C 06/28/2015 11:44 AM

## 2015-06-28 NOTE — Progress Notes (Signed)
Advanced Heart Failure Medication Review by a Pharmacist  Does the patient  feel that his/her medications are working for him/her?  yes  Has the patient been experiencing any side effects to the medications prescribed?  no  Does the patient measure his/her own blood pressure or blood glucose at home?  yes   Does the patient have any problems obtaining medications due to transportation or finances?   no  Understanding of regimen: good Understanding of indications: good Potential of compliance: good    Pharmacist comments:  Ms. Pacifico is a pleasant 59 yo F presenting without a medication list but able to verbalize most of her medications including dosages to me. She reports excellent compliance with all of her medications. She did ask if it would be okay for her to take Benadryl for allergic reactions to mosquito bites which I assured her would be fine. She also asked about Voltaren gel and Aleve utilization. We discussed the risks associated with NSAIDs including a potential for increased risk of MI/stroke, bleeding and renal insufficiency and I recommended utilizing APAP instead for minor aches and pains. She did not have any other medication-related questions or concerns at this time.   Ruta Hinds. Velva Harman, PharmD, BCPS, CPP Clinical Pharmacist Pager: [phone] Phone: [phone] 06/28/2015 10:45 AM

## 2015-06-29 ENCOUNTER — Telehealth (HOSPITAL_COMMUNITY): Payer: Self-pay | Admitting: *Deleted

## 2015-06-29 DIAGNOSIS — I5022 Chronic systolic (congestive) heart failure: Secondary | ICD-10-CM

## 2015-06-29 DIAGNOSIS — I447 Left bundle-branch block, unspecified: Secondary | ICD-10-CM

## 2015-06-29 NOTE — Telephone Encounter (Signed)
Dr Haroldine Laws spoke w/pt on the phone regarding her results, he recommends she see EP to discuss ICD and her LBBB, pt agreeable, referral placed

## 2015-06-30 ENCOUNTER — Ambulatory Visit (INDEPENDENT_AMBULATORY_CARE_PROVIDER_SITE_OTHER): Payer: BLUE CROSS/BLUE SHIELD | Admitting: Physician Assistant

## 2015-06-30 ENCOUNTER — Encounter: Payer: Self-pay | Admitting: Physician Assistant

## 2015-06-30 VITALS — BP 116/54 | HR 61 | Temp 97.6°F | Resp 16 | Ht 66.0 in | Wt 214.0 lb

## 2015-06-30 DIAGNOSIS — F909 Attention-deficit hyperactivity disorder, unspecified type: Secondary | ICD-10-CM

## 2015-06-30 DIAGNOSIS — F988 Other specified behavioral and emotional disorders with onset usually occurring in childhood and adolescence: Secondary | ICD-10-CM

## 2015-06-30 MED ORDER — LISDEXAMFETAMINE DIMESYLATE 40 MG PO CAPS: 40.0000 mg | ORAL_CAPSULE | ORAL | Status: DC

## 2015-06-30 NOTE — Patient Instructions (Signed)
Please continue your medications as directed. Follow-up with Dr. Caryl Comes as scheduled. As long as things are going well, we will follow-up in 3 months.  Return sooner if needed.

## 2015-06-30 NOTE — Progress Notes (Signed)
Pre visit review using our clinic review tool, if applicable. No additional management support is needed unless otherwise documented below in the visit note/SLS  

## 2015-06-30 NOTE — Progress Notes (Signed)
Patient presents to clinic today for follow-up of ADD after restarting her Vyvanse. Endorses taking daily as directed. Is focusing better with medication. Denies insomnia, anorexia, chest pain or palpitations with medication.  Cardiology is recommended holding off on restart Adderall for the time being.   Past Medical History  Diagnosis Date  . ADD (attention deficit disorder)   . Environmental allergies   . Chicken pox   . H/O vitamin D deficiency   . Seasonal affective disorder   . LBBB (left bundle branch block)     W/1st degree AV Block, Avoid Beta Blockers  . AR (allergic rhinitis)   . Depression   . Dyslipidemia   . OSA (obstructive sleep apnea) 03/18/2015    Severe OSA with AHI 30/hr    Current Outpatient Prescriptions on File Prior to Visit  Medication Sig Dispense Refill  . carvedilol (COREG) 6.25 MG tablet Take 1.5 tablets (9.375 mg total) by mouth 2 (two) times daily with a meal. 90 tablet 3  . cholecalciferol (VITAMIN D) 1000 UNITS tablet Take 2,000 Units by mouth daily.    . digoxin (LANOXIN) 0.125 MG tablet Take 0.5 tablets (0.0625 mg total) by mouth daily. 90 tablet 3  . fexofenadine (ALLEGRA) 180 MG tablet Take 1 tablet (180 mg total) by mouth daily.    . fluticasone (FLONASE) 50 MCG/ACT nasal spray Place 1 spray into both nostrils daily.    . furosemide (LASIX) 20 MG tablet Take 1 tablet by mouth every other day 15 tablet 3  . lisdexamfetamine (VYVANSE) 40 MG capsule Take 1 capsule (40 mg total) by mouth every morning. 30 capsule 0  . lisinopril (PRINIVIL,ZESTRIL) 10 MG tablet Take 1 tablet (10 mg total) by mouth 2 (two) times daily. 60 tablet 6  . pantoprazole (PROTONIX) 40 MG tablet Take 1 tablet (40 mg total) by mouth daily. (Patient taking differently: Take 40 mg by mouth daily as needed. ) 30 tablet 3  . potassium chloride (K-DUR) 10 MEQ tablet Take 2 tablets by mouth every other day 30 tablet 3  . spironolactone (ALDACTONE) 25 MG tablet TAKE 1 TABLET(25 MG)  BY MOUTH DAILY 30 tablet 6   No current facility-administered medications on file prior to visit.    Allergies  Allergen Reactions  . Bee Venom Anaphylaxis  . Erythromycin Itching and Rash  . Penicillins Hives, Itching and Rash    Family History  Problem Relation Age of Onset  . COPD Mother     Living  . Pancreatic cancer Father 58    Deceased  . Lung cancer Paternal Grandfather   . Heart disease Paternal Grandmother   . Hypertension Paternal Grandmother   . Alzheimer's disease Maternal Grandfather   . Arthritis/Rheumatoid Maternal Grandmother   . Heart disease Maternal Grandmother     Tachycardia  . Cancer Paternal Uncle   . Breast cancer Sister 22  . Healthy Brother     x3  . Healthy Daughter     x3    Social History   Social History  . Marital Status: Legally Separated    Spouse Name: N/A  . Number of Children: N/A  . Years of Education: N/A   Social History Main Topics  . Smoking status: Never Smoker   . Smokeless tobacco: Never Used  . Alcohol Use: 2.4 oz/week    4 Glasses of wine per week  . Drug Use: No  . Sexual Activity: Not Asked   Other Topics Concern  . None   Social  History Narrative   Review of Systems - See HPI.  All other ROS are negative.  BP 116/54 mmHg  Pulse 61  Temp(Src) 97.6 F (36.4 C) (Oral)  Resp 16  Ht _0  (1.676 m)  Wt 214 lb (97.07 kg)  BMI 34.56 kg/m2  SpO2 99%  Physical Exam  Constitutional: She is oriented to person, place, and time and well-developed, well-nourished, and in no distress.  HENT:  Head: Normocephalic and atraumatic.  Eyes: Conjunctivae are normal.  Neck: Neck supple.  Cardiovascular: Normal rate, regular rhythm, normal heart sounds and intact distal pulses.   Pulmonary/Chest: Effort normal and breath sounds normal. No respiratory distress. She has no wheezes. She has no rales. She exhibits no tenderness.  Neurological: She is alert and oriented to person, place, and time.  Skin: Skin is warm and  dry. No rash noted.  Psychiatric: Affect normal.  Vitals reviewed.   Recent Results (from the past 2160 hour(s))  Basic metabolic panel     Status: Abnormal   Collection Time: 04/15/15 10:00 AM  Result Value Ref Range   Sodium 139 135 - 145 mmol/L   Potassium 4.7 3.5 - 5.1 mmol/L   Chloride 104 101 - 111 mmol/L   CO2 27 22 - 32 mmol/L   Glucose, Bld 103 (H) 65 - 99 mg/dL   BUN 20 6 - 20 mg/dL   Creatinine, Ser 0.98 0.44 - 1.00 mg/dL   Calcium 10.0 8.9 - 10.3 mg/dL   GFR calc non Af Amer >60 >60 mL/min   GFR calc Af Amer >60 >60 mL/min    Comment: (NOTE) The eGFR has been calculated using the CKD EPI equation. This calculation has not been validated in all clinical situations. eGFR's persistently <60 mL/min signify possible Chronic Kidney Disease.    Anion gap 8 5 - 15  B Nat Peptide     Status: None   Collection Time: 04/15/15 10:00 AM  Result Value Ref Range   B Natriuretic Peptide 90.6 0.0 - 100.0 pg/mL  Digoxin level     Status: None   Collection Time: 04/15/15 10:00 AM  Result Value Ref Range   Digoxin Level 1.3 0.8 - 2.0 ng/mL  Basic metabolic panel     Status: Abnormal   Collection Time: 04/27/15 10:45 AM  Result Value Ref Range   Sodium 133 (L) 135 - 145 mmol/L   Potassium 4.7 3.5 - 5.1 mmol/L   Chloride 101 101 - 111 mmol/L   CO2 24 22 - 32 mmol/L   Glucose, Bld 102 (H) 65 - 99 mg/dL   BUN 18 6 - 20 mg/dL   Creatinine, Ser 0.92 0.44 - 1.00 mg/dL   Calcium 9.7 8.9 - 10.3 mg/dL   GFR calc non Af Amer >60 >60 mL/min   GFR calc Af Amer >60 >60 mL/min    Comment: (NOTE) The eGFR has been calculated using the CKD EPI equation. This calculation has not been validated in all clinical situations. eGFR's persistently <60 mL/min signify possible Chronic Kidney Disease.    Anion gap 8 5 - 15  Digoxin level     Status: Abnormal   Collection Time: 04/27/15 10:45 AM  Result Value Ref Range   Digoxin Level 0.4 (L) 0.8 - 2.0 ng/mL  Basic metabolic panel     Status:  Abnormal   Collection Time: 05/17/15 11:46 AM  Result Value Ref Range   Sodium 135 135 - 145 mmol/L   Potassium 4.6 3.5 - 5.1 mmol/L  Chloride 102 101 - 111 mmol/L   CO2 24 22 - 32 mmol/L   Glucose, Bld 104 (H) 65 - 99 mg/dL   BUN 20 6 - 20 mg/dL   Creatinine, Ser 0.84 0.44 - 1.00 mg/dL   Calcium 9.5 8.9 - 10.3 mg/dL   GFR calc non Af Amer >60 >60 mL/min   GFR calc Af Amer >60 >60 mL/min    Comment: (NOTE) The eGFR has been calculated using the CKD EPI equation. This calculation has not been validated in all clinical situations. eGFR's persistently <60 mL/min signify possible Chronic Kidney Disease.    Anion gap 9 5 - 15    Assessment/Plan: ADD (attention deficit disorder) Doing well. Continue medications. Medication refilled. Will wait on Cardiology to allow her to restart the Adderall. Follow-up in 3 months.

## 2015-06-30 NOTE — Assessment & Plan Note (Signed)
Doing well. Continue medications. Medication refilled. Will wait on Cardiology to allow her to restart the Adderall. Follow-up in 3 months.

## 2015-07-07 ENCOUNTER — Ambulatory Visit: Payer: 59 | Admitting: Cardiology

## 2015-07-09 ENCOUNTER — Telehealth: Payer: Self-pay | Admitting: Internal Medicine

## 2015-07-14 NOTE — Progress Notes (Signed)
ELECTROPHYSIOLOGY CONSULT NOTE  Patient ID: Debra Holloway, MRN: [id number], DOB/AGE: 1956/08/06 59 y.o. Admit date: (Not on file) Date of Consult: 07/16/2015  Primary Physician: Leeanne Rio, PA-C Primary Cardiologist: DB Chief Complaint: ICD-CRT   HPI Debra Holloway is a 59 y.o. female referred for consideration of CRT-D.  She has a history of nonischemic heart disease confirmed by catheterization 2/16 when she presented with acute systolic heart failure. Ejection fraction was 15-20%. Most recently seen in the heart failure clinic 8/16 at which time she had minimal complaints of exercise intolerance. There has been no interval improvement in LV function. DATE TEST    2/16 CATH   EF15-20   4/16/ MRI   EF29   6/16 ECHO EF 30   9/16 ECHO EF25    ECG 4/16 >> 3 PVC >>20-25%   She has chronic modest shortness of breath at the top of flight of stairs. She does not have peripheral edema but does tend to have abdominal swelling. She has chronic 2 pillow orthopnea.  She's had no syncope, but doesn't a long-standing history of dizziness. This is sounded vertiginous and she underwent extensive evaluation in [place] decade ago. She continues with symptoms of positional vertigo. She's had one episode of presyncope which was associated with this same vertiginous sensation.  She has chronic palpitations and has had such for many many years. She calls them "her jalopy". It is not clear as to whether these have increased in frequency over recent months or not.  She has endured a great deal of anxiety and stress related to the care of 3 of her close family members including her mother who died in the spring. She also notes that she has had 3 divorces.  Past Medical History  Diagnosis Date  . ADD (attention deficit disorder)   . Environmental allergies   . Chicken pox   . H/O vitamin D deficiency   . Seasonal affective disorder ([place])   . LBBB (left bundle branch block)     W/1st  degree AV Block, Avoid Beta Blockers  . AR (allergic rhinitis)   . Depression   . Dyslipidemia   . OSA (obstructive sleep apnea) 03/18/2015    Severe OSA with AHI 30/hr      Surgical History:  Past Surgical History  Procedure Laterality Date  . Dilation and curettage of uterus  2003  . Abdominal hysterectomy  2004  . Wisdom tooth extraction  1986  . Left and right heart catheterization with coronary angiogram N/A 11/17/2014    Procedure: LEFT AND RIGHT HEART CATHETERIZATION WITH CORONARY ANGIOGRAM;  Surgeon: Troy Sine, MD;  [facility] C[facility] LAB;  Service: Cardiovascular;  Laterality: N/A;     Home Meds: Prior to Admission medications   Medication Sig Start Date End Date Taking? Authorizing Provider  carvedilol (COREG) 6.25 MG tablet Take 1.5 tablets (9.375 mg total) by mouth 2 (two) times daily with a meal. 03/18/15   Jolaine Artist, MD  cholecalciferol (VITAMIN D) 1000 UNITS tablet Take 2,000 Units by mouth daily.    Historical Provider, MD  digoxin (LANOXIN) 0.125 MG tablet Take 0.5 tablets (0.0625 mg total) by mouth daily. 04/16/15   Larey Dresser, MD  fexofenadine (ALLEGRA) 180 MG tablet Take 1 tablet (180 mg total) by mouth daily. 11/30/14   Dorothy Spark, MD  fluticasone (FLONASE) 50 MCG/ACT nasal spray Place 1 spray into both nostrils daily.    Historical Provider, MD  furosemide (LASIX) 20  MG tablet Take 1 tablet by mouth every other day 04/15/15   Larey Dresser, MD  lisdexamfetamine (VYVANSE) 40 MG capsule Take 1 capsule (40 mg total) by mouth every morning. 06/30/15   Brunetta Jeans, PA-C  lisinopril (PRINIVIL,ZESTRIL) 10 MG tablet Take 1 tablet (10 mg total) by mouth 2 (two) times daily. 05/17/15   Jolaine Artist, MD  pantoprazole (PROTONIX) 40 MG tablet Take 1 tablet (40 mg total) by mouth daily. Patient taking differently: Take 40 mg by mouth daily as needed.  04/06/15   Jolaine Artist, MD  potassium chloride (K-DUR) 10 MEQ tablet Take 2 tablets by mouth  every other day 04/15/15   Larey Dresser, MD  spironolactone (ALDACTONE) 25 MG tablet TAKE 1 TABLET(25 MG) BY MOUTH DAILY 04/23/15   Jolaine Artist, MD    Allergies:  Allergies  Allergen Reactions  . Bee Venom Anaphylaxis  . Erythromycin Itching and Rash  . Penicillins Hives, Itching and Rash    Social History   Social History  . Marital Status: Legally Separated    Spouse Name: N/A  . Number of Children: N/A  . Years of Education: N/A   Occupational History  . Not on file.   Social History Main Topics  . Smoking status: Never Smoker   . Smokeless tobacco: Never Used  . Alcohol Use: 2.4 oz/week    4 Glasses of wine per week  . Drug Use: No  . Sexual Activity: Not on file   Other Topics Concern  . Not on file   Social History Narrative     Family History  Problem Relation Age of Onset  . COPD Mother     Living  . Pancreatic cancer Father 71    Deceased  . Lung cancer Paternal Grandfather   . Heart disease Paternal Grandmother   . Hypertension Paternal Grandmother   . Alzheimer's disease Maternal Grandfather   . Arthritis/Rheumatoid Maternal Grandmother   . Heart disease Maternal Grandmother     Tachycardia  . Cancer Paternal Uncle   . Breast cancer Sister 4  . Healthy Brother     x3  . Healthy Daughter     x3     ROS:  Please see the history of present illness.     All other systems reviewed and negative.    Physical Exam:  Blood pressure 122/74, pulse 66, height 5\' 6"  (1.676 m), weight 213 lb 3.2 oz (96.707 kg). General: Well developed, well nourished female in no acute distress. Head: Normocephalic, atraumatic, sclera non-icteric, no xanthomas, nares are without discharge. EENT: normal  Lymph Nodes:  none Neck: Negative for carotid bruits. JVD not elevated. Back:without scoliosis kyphosis Lungs: Clear bilaterally to auscultation without wheezes, rales, or rhonchi. Breathing is unlabored. Heart: RRR with S1 S2.  2/6 systolic murmur . No  rubs, or gallops appreciated. Abdomen: Soft, non-tender, non-distended with normoactive bowel sounds. No hepatomegaly. No rebound/guarding. No obvious abdominal masses. Msk:  Strength and tone appear normal for age. Extremities: No clubbing or cyanosis. Tr wedmema.  Distal pedal pulses are 2+ and equal bilaterally. Skin: Warm and Dry Neuro: Alert and oriented X 3. CN III-XII intact Grossly normal sensory and motor function . Psych:  Responds to questions appropriately with a normal affect. tearful     Labs: Cardiac Enzymes No results for input(s): CKTOTAL, CKMB, TROPONINI in the last 72 hours. CBC Lab Results  Component Value Date   WBC 7.8 01/28/2015   HGB 12.7 01/28/2015  HCT 37.2 01/28/2015   MCV 89.0 01/28/2015   PLT 248 01/28/2015   PROTIME: No results for input(s): LABPROT, INR in the last 72 hours. Chemistry No results for input(s): NA, K, CL, CO2, BUN, CREATININE, CALCIUM, PROT, BILITOT, ALKPHOS, ALT, AST, GLUCOSE in the last 168 hours.  Invalid input(s): LABALBU Lipids Lab Results  Component Value Date   CHOL 225* 09/07/2014   HDL 69.60 09/07/2014   LDLCALC 136* 09/07/2014   TRIG 95.0 09/07/2014   BNP No results found for: PROBNP Thyroid Function Tests: No results for input(s): TSH, T4TOTAL, T3FREE, THYROIDAB in the last 72 hours.  Invalid input(s): FREET3 Miscellaneous Lab Results  Component Value Date   DDIMER 0.61* 11/13/2014    Radiology/Studies:  No results found.  EKG:  NSR  LBBB   Assessment and Plan:  NICM  LBBB  PVCs  CHF  Chronic systolic grade 3 Vertigo   Good morning the patient has a nonischemic cardiomyopathy and congestive heart failure she's persisted despite guidelines directed medical therapy. She also has left bundle branch block. ECG from April 2016 demonstrated multiple PVCs-monomorphic which raises the possibility that there may be a reversible component to her cardiomyopathy. Hence, we will get a Holter monitor.  She  has a long-standing history of palpitations which has been stable.   she also has significant vertigo which complicates interpretation of her spells of dizziness and presyncope. These spells have been unrelated with her palpitations.     Virl Axe

## 2015-07-16 ENCOUNTER — Encounter: Payer: Self-pay | Admitting: Internal Medicine

## 2015-07-16 ENCOUNTER — Ambulatory Visit (INDEPENDENT_AMBULATORY_CARE_PROVIDER_SITE_OTHER): Payer: BLUE CROSS/BLUE SHIELD

## 2015-07-16 ENCOUNTER — Ambulatory Visit (INDEPENDENT_AMBULATORY_CARE_PROVIDER_SITE_OTHER): Payer: BLUE CROSS/BLUE SHIELD | Admitting: Internal Medicine

## 2015-07-16 VITALS — BP 122/74 | HR 66 | Ht 66.0 in | Wt 213.2 lb

## 2015-07-16 DIAGNOSIS — I493 Ventricular premature depolarization: Secondary | ICD-10-CM

## 2015-07-16 DIAGNOSIS — I5022 Chronic systolic (congestive) heart failure: Secondary | ICD-10-CM

## 2015-07-16 NOTE — Patient Instructions (Addendum)
Medication Instructions: - no changes  Labwork: - none  Procedures/Testing: - Your physician has recommended that you wear a 48 hour holter monitor. Holter monitors are medical devices that record the heart's electrical activity. Doctors most often use these monitors to diagnose arrhythmias. Arrhythmias are problems with the speed or rhythm of the heartbeat. The monitor is a small, portable device. You can wear one while you do your normal daily activities. This is usually used to diagnose what is causing palpitations/syncope (passing out).  Follow-Up: - Pending the results of your holter.  Any Additional Special Instructions Will Be Listed Below (If Applicable). - none

## 2015-07-20 ENCOUNTER — Telehealth: Payer: Self-pay | Admitting: Internal Medicine

## 2015-07-20 NOTE — Telephone Encounter (Signed)
I spoke with the patient and made her aware I never heard back from Dr. Caryl Comes if Dr. Haroldine Laws still wanted to see the patient on 07/23/15. I called and spoke with Kevan Rosebush, RN in CHF clinic and made her aware the patient wore a holter for PVC burden and results are pending.  I asked if the patient still needed to see Dr. Haroldine Laws on Friday- per [facility] patient only needs to be seen if she wants but unlikely changes will be made.  Per my discussion with the patient, she will cancel her appt with Dr. Haroldine Laws for Friday and we will await results of the holter. The patient is aware Dr. Caryl Comes is out the rest of the week.

## 2015-07-20 NOTE — Telephone Encounter (Signed)
New Message  Pt calling to speak w/ RN concerning Holter. Pt also wants to see if she needs appt w/ [facility] (sched for 10/21). Please call back and discuss.

## 2015-07-21 ENCOUNTER — Ambulatory Visit: Payer: BLUE CROSS/BLUE SHIELD | Admitting: Cardiology

## 2015-07-23 ENCOUNTER — Encounter (HOSPITAL_COMMUNITY): Payer: BLUE CROSS/BLUE SHIELD | Admitting: Internal Medicine

## 2015-07-28 ENCOUNTER — Telehealth: Payer: Self-pay | Admitting: Internal Medicine

## 2015-07-28 NOTE — Telephone Encounter (Signed)
I called and spoke with the patient. She has not brought her monitor back yet. She thought we could retreive results remotely. I advised for her particular monitor we cannot. She will bring this back tomorrow.

## 2015-07-28 NOTE — Telephone Encounter (Signed)
New message  ° ° °Patient calling back for test results.  °

## 2015-08-03 ENCOUNTER — Telehealth: Payer: Self-pay | Admitting: Internal Medicine

## 2015-08-03 NOTE — Telephone Encounter (Signed)
I spoke with the patient. She would like to go ahead and schedule her Bi-V ICD implant, but would like to discuss with Dr. Caryl Comes a little further. I advised her I would go ahead and schedule this for 11/14 per her request and see if Dr. Caryl Comes and call and talk with her. She is agreeable.

## 2015-08-03 NOTE — Telephone Encounter (Signed)
Deboraha Sprang, MD  Emily Filbert, RN           H Good am Tell her PVC burden is scant we can proceed with CRT D  Thanks       Previous Messages

## 2015-08-03 NOTE — Telephone Encounter (Signed)
New message     Want monitor results 

## 2015-08-06 NOTE — Telephone Encounter (Signed)
Dr. Caryl Comes called and spoke with the patient.

## 2015-08-10 ENCOUNTER — Encounter: Payer: Self-pay | Admitting: *Deleted

## 2015-08-10 NOTE — Telephone Encounter (Signed)
I called the patient today and made her aware of her date/ time for her CRT- D implant. Verbal instructions given to the patient- written instructions sent through [facility]. The patient is in [place] all week (through Sunday) for work. OK per Dr. Caryl Comes to draw labs on arrival.

## 2015-08-13 ENCOUNTER — Other Ambulatory Visit: Payer: Self-pay | Admitting: Internal Medicine

## 2015-08-13 DIAGNOSIS — I420 Dilated cardiomyopathy: Secondary | ICD-10-CM

## 2015-08-16 ENCOUNTER — Ambulatory Visit (HOSPITAL_COMMUNITY)
Admission: RE | Admit: 2015-08-16 | Discharge: 2015-08-17 | Disposition: A | Discharge status: Home / Self Care | Payer: BLUE CROSS/BLUE SHIELD | Source: Ambulatory Visit | Attending: Internal Medicine | Admitting: Internal Medicine

## 2015-08-16 ENCOUNTER — Encounter (HOSPITAL_COMMUNITY)
Admission: RE | Disposition: A | Discharge status: Home / Self Care | Payer: Self-pay | Source: Ambulatory Visit | Attending: Internal Medicine

## 2015-08-16 ENCOUNTER — Ambulatory Visit (HOSPITAL_BASED_OUTPATIENT_CLINIC_OR_DEPARTMENT_OTHER): Payer: BLUE CROSS/BLUE SHIELD

## 2015-08-16 ENCOUNTER — Encounter (HOSPITAL_COMMUNITY): Payer: Self-pay | Admitting: Internal Medicine

## 2015-08-16 DIAGNOSIS — Z959 Presence of cardiac and vascular implant and graft, unspecified: Secondary | ICD-10-CM

## 2015-08-16 DIAGNOSIS — E785 Hyperlipidemia, unspecified: Secondary | ICD-10-CM | POA: Insufficient documentation

## 2015-08-16 DIAGNOSIS — Z79899 Other long term (current) drug therapy: Secondary | ICD-10-CM | POA: Insufficient documentation

## 2015-08-16 DIAGNOSIS — Z95810 Presence of automatic (implantable) cardiac defibrillator: Secondary | ICD-10-CM

## 2015-08-16 DIAGNOSIS — I9581 Postprocedural hypotension: Secondary | ICD-10-CM | POA: Insufficient documentation

## 2015-08-16 DIAGNOSIS — I447 Left bundle-branch block, unspecified: Secondary | ICD-10-CM | POA: Diagnosis present

## 2015-08-16 DIAGNOSIS — I313 Pericardial effusion (noninflammatory): Secondary | ICD-10-CM | POA: Diagnosis not present

## 2015-08-16 DIAGNOSIS — Z880 Allergy status to penicillin: Secondary | ICD-10-CM | POA: Insufficient documentation

## 2015-08-16 DIAGNOSIS — I5022 Chronic systolic (congestive) heart failure: Secondary | ICD-10-CM | POA: Diagnosis not present

## 2015-08-16 DIAGNOSIS — G4733 Obstructive sleep apnea (adult) (pediatric): Secondary | ICD-10-CM | POA: Diagnosis not present

## 2015-08-16 DIAGNOSIS — I428 Other cardiomyopathies: Secondary | ICD-10-CM

## 2015-08-16 DIAGNOSIS — F329 Major depressive disorder, single episode, unspecified: Secondary | ICD-10-CM | POA: Insufficient documentation

## 2015-08-16 DIAGNOSIS — I420 Dilated cardiomyopathy: Secondary | ICD-10-CM

## 2015-08-16 DIAGNOSIS — I429 Cardiomyopathy, unspecified: Secondary | ICD-10-CM | POA: Insufficient documentation

## 2015-08-16 HISTORY — DX: Presence of automatic (implantable) cardiac defibrillator: Z95.810

## 2015-08-16 HISTORY — PX: EP IMPLANTABLE DEVICE: SHX172B

## 2015-08-16 HISTORY — DX: Heart failure, unspecified: I50.9

## 2015-08-16 HISTORY — DX: Anxiety disorder, unspecified: F41.9

## 2015-08-16 LAB — BASIC METABOLIC PANEL
Anion gap: 10 (ref 5–15)
BUN: 19 mg/dL (ref 6–20)
CHLORIDE: 106 mmol/L (ref 101–111)
CO2: 23 mmol/L (ref 22–32)
Calcium: 9.6 mg/dL (ref 8.9–10.3)
Creatinine, Ser: 1.36 mg/dL — ABNORMAL HIGH (ref 0.44–1.00)
GFR calc Af Amer: 49 mL/min — ABNORMAL LOW (ref >60)
GFR calc non Af Amer: 42 mL/min — ABNORMAL LOW (ref >60)
Glucose, Bld: 105 mg/dL — ABNORMAL HIGH (ref 65–99)
Potassium: 4.3 mmol/L (ref 3.5–5.1)
Sodium: 139 mmol/L (ref 135–145)

## 2015-08-16 LAB — CBC
HEMATOCRIT: 35.4 % — AB (ref 36.0–46.0)
HEMOGLOBIN: 11.7 g/dL — AB (ref 12.0–15.0)
MCH: 30.6 pg (ref 26.0–34.0)
MCHC: 33.1 g/dL (ref 30.0–36.0)
MCV: 92.7 fL (ref 78.0–100.0)
Platelets: 223 10*3/uL (ref 150–400)
RBC: 3.82 MIL/uL — ABNORMAL LOW (ref 3.87–5.11)
RDW: 13 % (ref 11.5–15.5)
WBC: 6.8 10*3/uL (ref 4.0–10.5)

## 2015-08-16 LAB — PROTIME-INR
INR: 1.06 (ref 0.00–1.49)
Prothrombin Time: 14 seconds (ref 11.6–15.2)

## 2015-08-16 LAB — SURGICAL PCR SCREEN
MRSA, PCR: NEGATIVE
Staphylococcus aureus: NEGATIVE

## 2015-08-16 SURGERY — BIV ICD INSERTION CRT-D
Anesthesia: LOCAL

## 2015-08-16 MED ORDER — OXYCODONE-ACETAMINOPHEN 5-325 MG PO TABS
1.0000 | ORAL_TABLET | Freq: Once | ORAL | Status: AC
  Administered 2015-08-16: 1 via ORAL
  Filled 2015-08-16: qty 2

## 2015-08-16 MED ORDER — ACETAMINOPHEN 325 MG PO TABS
325.0000 mg | ORAL_TABLET | ORAL | Status: DC | PRN
  Administered 2015-08-16 – 2015-08-17 (×3): 650 mg via ORAL
  Filled 2015-08-16 (×3): qty 2

## 2015-08-16 MED ORDER — MUPIROCIN 2 % EX OINT
TOPICAL_OINTMENT | CUTANEOUS | Status: AC
  Administered 2015-08-16: 1 via TOPICAL
  Filled 2015-08-16: qty 22

## 2015-08-16 MED ORDER — VANCOMYCIN HCL IN DEXTROSE 1-5 GM/200ML-% IV SOLN
1000.0000 mg | INTRAVENOUS | Status: AC
  Administered 2015-08-16: 1000 mg via INTRAVENOUS
  Filled 2015-08-16 (×2): qty 200

## 2015-08-16 MED ORDER — SODIUM CHLORIDE 0.9 % IV SOLN: INTRAVENOUS | Status: AC

## 2015-08-16 MED ORDER — SODIUM CHLORIDE 0.9 % IJ SOLN: 3.0000 mL | INTRAMUSCULAR | Status: DC | PRN

## 2015-08-16 MED ORDER — LIDOCAINE HCL (PF) 1 % IJ SOLN
INTRAMUSCULAR | Status: DC | PRN
  Administered 2015-08-16: 46 mL via INTRADERMAL

## 2015-08-16 MED ORDER — ACETAMINOPHEN 500 MG PO TABS
500.0000 mg | ORAL_TABLET | Freq: Four times a day (QID) | ORAL | Status: DC | PRN
  Administered 2015-08-17: 500 mg via ORAL
  Filled 2015-08-16: qty 1

## 2015-08-16 MED ORDER — CHLORHEXIDINE GLUCONATE 4 % EX LIQD
60.0000 mL | Freq: Once | CUTANEOUS | Status: DC
  Filled 2015-08-16: qty 60

## 2015-08-16 MED ORDER — MIDAZOLAM HCL 5 MG/5ML IJ SOLN
INTRAMUSCULAR | Status: AC
  Filled 2015-08-16: qty 25

## 2015-08-16 MED ORDER — CARVEDILOL 6.25 MG PO TABS
9.3750 mg | ORAL_TABLET | Freq: Two times a day (BID) | ORAL | Status: DC
  Administered 2015-08-16 – 2015-08-17 (×2): 9.375 mg via ORAL
  Filled 2015-08-16 (×4): qty 1

## 2015-08-16 MED ORDER — SODIUM CHLORIDE 0.9 % IV SOLN
INTRAVENOUS | Status: DC
  Administered 2015-08-16: 09:00:00 via INTRAVENOUS

## 2015-08-16 MED ORDER — IOHEXOL 350 MG/ML SOLN
INTRAVENOUS | Status: DC | PRN
  Administered 2015-08-16: 6 mL via INTRACARDIAC

## 2015-08-16 MED ORDER — FENTANYL CITRATE (PF) 100 MCG/2ML IJ SOLN
INTRAMUSCULAR | Status: AC
  Filled 2015-08-16: qty 4

## 2015-08-16 MED ORDER — SPIRONOLACTONE 25 MG PO TABS
12.5000 mg | ORAL_TABLET | Freq: Once | ORAL | Status: DC
  Filled 2015-08-16: qty 1

## 2015-08-16 MED ORDER — LIDOCAINE HCL (PF) 1 % IJ SOLN
INTRAMUSCULAR | Status: AC
  Filled 2015-08-16: qty 30

## 2015-08-16 MED ORDER — ONDANSETRON HCL 4 MG/2ML IJ SOLN
4.0000 mg | Freq: Four times a day (QID) | INTRAMUSCULAR | Status: DC | PRN
  Administered 2015-08-16 (×2): 4 mg via INTRAVENOUS
  Filled 2015-08-16 (×2): qty 2

## 2015-08-16 MED ORDER — PANTOPRAZOLE SODIUM 40 MG PO TBEC: 40.0000 mg | DELAYED_RELEASE_TABLET | Freq: Every day | ORAL | Status: DC | PRN

## 2015-08-16 MED ORDER — FLUTICASONE PROPIONATE 50 MCG/ACT NA SUSP
1.0000 | Freq: Every day | NASAL | Status: DC
  Administered 2015-08-17: 1 via NASAL
  Filled 2015-08-16: qty 16

## 2015-08-16 MED ORDER — LISINOPRIL 10 MG PO TABS
10.0000 mg | ORAL_TABLET | Freq: Two times a day (BID) | ORAL | Status: DC
  Administered 2015-08-17: 10 mg via ORAL
  Filled 2015-08-16: qty 1

## 2015-08-16 MED ORDER — DIGOXIN 125 MCG PO TABS
0.0625 mg | ORAL_TABLET | Freq: Every day | ORAL | Status: DC
  Administered 2015-08-17: 0.0625 mg via ORAL
  Filled 2015-08-16: qty 1

## 2015-08-16 MED ORDER — OXYCODONE-ACETAMINOPHEN 5-325 MG PO TABS
1.0000 | ORAL_TABLET | ORAL | Status: DC | PRN
  Administered 2015-08-16: 1 via ORAL
  Filled 2015-08-16: qty 1

## 2015-08-16 MED ORDER — POTASSIUM CHLORIDE ER 10 MEQ PO TBCR
20.0000 meq | EXTENDED_RELEASE_TABLET | ORAL | Status: DC
  Administered 2015-08-17: 20 meq via ORAL
  Filled 2015-08-16 (×2): qty 2

## 2015-08-16 MED ORDER — SODIUM CHLORIDE 0.9 % IR SOLN
Status: AC
  Filled 2015-08-16: qty 2

## 2015-08-16 MED ORDER — HEPARIN (PORCINE) IN NACL 2-0.9 UNIT/ML-% IJ SOLN
INTRAMUSCULAR | Status: AC
  Filled 2015-08-16: qty 500

## 2015-08-16 MED ORDER — VITAMIN D 1000 UNITS PO TABS
2000.0000 [IU] | ORAL_TABLET | Freq: Every day | ORAL | Status: DC
  Administered 2015-08-16 – 2015-08-17 (×2): 2000 [IU] via ORAL
  Filled 2015-08-16 (×2): qty 2

## 2015-08-16 MED ORDER — SODIUM CHLORIDE 0.9 % IV SOLN
250.0000 mL | INTRAVENOUS | Status: DC
  Administered 2015-08-16: 09:00:00 via INTRAVENOUS

## 2015-08-16 MED ORDER — SODIUM CHLORIDE 0.9 % IR SOLN
80.0000 mg | Status: AC
  Administered 2015-08-16: 80 mg
  Filled 2015-08-16: qty 2

## 2015-08-16 MED ORDER — MIDAZOLAM HCL 5 MG/5ML IJ SOLN
INTRAMUSCULAR | Status: DC | PRN
  Administered 2015-08-16: 1 mg via INTRAVENOUS
  Administered 2015-08-16 (×2): 2 mg via INTRAVENOUS
  Administered 2015-08-16: 1 mg via INTRAVENOUS
  Administered 2015-08-16: 2 mg via INTRAVENOUS
  Administered 2015-08-16: 1 mg via INTRAVENOUS
  Administered 2015-08-16 (×2): 2 mg via INTRAVENOUS
  Administered 2015-08-16 (×2): 1 mg via INTRAVENOUS

## 2015-08-16 MED ORDER — FUROSEMIDE 20 MG PO TABS
20.0000 mg | ORAL_TABLET | ORAL | Status: DC
  Administered 2015-08-17: 20 mg via ORAL
  Filled 2015-08-16: qty 1

## 2015-08-16 MED ORDER — VANCOMYCIN HCL IN DEXTROSE 1-5 GM/200ML-% IV SOLN
1000.0000 mg | Freq: Two times a day (BID) | INTRAVENOUS | Status: AC
  Administered 2015-08-16: 1000 mg via INTRAVENOUS
  Filled 2015-08-16: qty 200

## 2015-08-16 MED ORDER — SODIUM CHLORIDE 0.9 % IJ SOLN: 3.0000 mL | Freq: Two times a day (BID) | INTRAMUSCULAR | Status: DC

## 2015-08-16 MED ORDER — LISDEXAMFETAMINE DIMESYLATE 20 MG PO CAPS
40.0000 mg | ORAL_CAPSULE | Freq: Every day | ORAL | Status: DC
  Administered 2015-08-17: 40 mg via ORAL
  Filled 2015-08-16 (×2): qty 2

## 2015-08-16 MED ORDER — HEPARIN (PORCINE) IN NACL 2-0.9 UNIT/ML-% IJ SOLN
INTRAMUSCULAR | Status: DC | PRN
  Administered 2015-08-16: 11:00:00

## 2015-08-16 MED ORDER — MUPIROCIN 2 % EX OINT
1.0000 "application " | TOPICAL_OINTMENT | Freq: Once | CUTANEOUS | Status: AC
  Administered 2015-08-16: 1 via TOPICAL
  Filled 2015-08-16: qty 22

## 2015-08-16 MED ORDER — FENTANYL CITRATE (PF) 100 MCG/2ML IJ SOLN
INTRAMUSCULAR | Status: DC | PRN
Start: 2015-08-16 — End: 2015-08-16
  Administered 2015-08-16: 50 ug via INTRAVENOUS
  Administered 2015-08-16 (×4): 25 ug via INTRAVENOUS
  Administered 2015-08-16: 50 ug via INTRAVENOUS
  Administered 2015-08-16 (×3): 25 ug via INTRAVENOUS

## 2015-08-16 SURGICAL SUPPLY — 16 items
CABLE SURGICAL S-101-97-12 (CABLE) ×2 IMPLANT
CATH CPS DIRECT 135 DS2C020 (CATHETERS) ×2 IMPLANT
ICD VIVA QUAD XT CRT-D DTBA1QQ (ICD Generator) ×2 IMPLANT
KIT ESSENTIALS PG (KITS) ×2 IMPLANT
LEAD ATTAIN PERFORMA S 4598-88 (Lead) ×2 IMPLANT
LEAD CAPSURE NOVUS 5076-52CM (Lead) ×2 IMPLANT
LEAD SPRINT QUAT SEC 6935M-62 (Lead) ×2 IMPLANT
PAD DEFIB LIFELINK (PAD) ×2 IMPLANT
SHEATH CLASSIC 7F (SHEATH) ×2 IMPLANT
SHEATH CLASSIC 9.5F (SHEATH) ×2 IMPLANT
SHEATH CLASSIC 9F (SHEATH) ×2 IMPLANT
SHIELD RADPAD SCOOP 12X17 (MISCELLANEOUS) ×2 IMPLANT
SLITTER UNIVERSAL DS2A003 (MISCELLANEOUS) ×2 IMPLANT
TRAY PACEMAKER INSERTION (PACKS) ×2 IMPLANT
WIRE ACUITY WHISPER EDS 4648 (WIRE) ×2 IMPLANT
WIRE HI TORQ VERSACORE-J 145CM (WIRE) ×2 IMPLANT

## 2015-08-16 NOTE — Op Note (Signed)
Debra Holloway, Debra Holloway                ACCOUNT NO.:  [id number]  MEDICAL RECORD NO.:  [id number]  LOCATION:  3W17C                        FACILITY:  [facility]  PHYSICIAN:  Deboraha Sprang, MD, FACCDATE OF BIRTH:  1956-02-26  DATE OF PROCEDURE:  08/16/2015 DATE OF DISCHARGE:                              OPERATIVE REPORT   PREOPERATIVE DIAGNOSES:  Nonischemic cardiomyopathy with left bundle- branch block and congestive heart failure.  POSTOPERATIVE DIAGNOSES:  Nonischemic cardiomyopathy with left bundle- branch block and congestive heart failure.  PROCEDURES:  Dual-chamber defibrillator implantation with left ventricular lead placement.  DESCRIPTION OF PROCEDURE:  Following we obtained informed consent, the patient was brought to the electrophysiology laboratory and placed on the fluoroscopic table in a supine position.  Prior to routine prep and drape, the patient became increasingly anxious and was tearful.  She acknowledged anxiety prior to coming over from the holding area and so in attendance we gave her Versed and fentanyl to help her calm down and following her becoming much more calm.  She was then prepped and draped in the standard fashion.  Thereafter, lidocaine was infiltrated in the prepectoral subclavicular region.  Incision was made and carried down to layer of the prepectoral fascia using electrocautery and sharp dissection.  A pocket was formed similarly, hemostasis was obtained.  Thereafter, attention was turned to gain access to the extrathoracic left subclavian vein, which was accomplished without difficulty without the aspiration or puncture of the artery.  Three separate venipunctures were accomplished.  Guidewires were placed and retained.  Sequentially, a 9, 9.5, and 7-French sheaths were placed through which we passed a Medtronic 6935 62-cm active fixation, single coil defibrillator lead, serial number [id number], a Saint Jude 135 CS cannulation sheath and  a Medtronic 5076 52-cm active fixation atrial lead, serial number [id number].  Under fluoroscopic guidance, the RV lead was manipulated to the distal apex where the amplitude was 10.9 with a pace impedance of 504, threshold 1.2 V at 0.5 milliseconds.  Current threshold was 2 mA.  There was no diaphragmatic pacing at 10 V and the current of injury was moderate.  The lead was secured to the prepectoral fascia.  We then obtained access to the coronary sinus without difficulty and a venogram demonstrated a high lateral branch, which was then targeted.  A Medtronic 4593-lead, serial number [id number] was deployed till the distal tip was in the apical 3rd.  In the RAL, the 2-3 pair was in the mid position between base and apex, and in this location, the QLV was 190 milliseconds (unbelievable).  In this location, bipolar amplitude was 2.5 with a pace impedance of 1075, a threshold 0.7 V at 0.5 milliseconds.  There was no diaphragmatic pacing at 10 V.  The 9.5- French sheath was removed.  The deployment sheath was left in place and the atrial lead was then deployed to the right atrial appendage where the bipolar P-wave was 1.6 with a pace impedance of 830, a threshold 0.9 V at 0.5 milliseconds.  Current of threshold was 1.0 mA, and there was no diaphragmatic pacing at 10 V.  The current of injury was brisk.  This lead was secured to the prepectoral fascia.  I then became aware that I had modestly dislocated the CA sheath, so the LV lead was redeployed more distally.  Parameters were reassessed and were stable.  The deployment sheath was then removed with some difficulty.  The lead was secured to the prepectoral fascia and then the leads were attached to Medtronic Viva quad CRTD defibrillator, serial number [id number].  Through the device, bipolar P-wave was 1.5 with a pace impedance of 589, a threshold 0.75 at 0.4.  The R-wave was 13.8 with a pace impedance of 513, a threshold 0.5 at 0.4 and  the LV impedance was 456 with a threshold 0.5 V at 0.4 milliseconds.  High- voltage impedance was 84 ohms.  With these acceptable parameters recorded, the pocket was copiously irrigated with antibiotic-containing saline solution.  Hemostasis was assured.  The leads in the pulse generator were placed in the pocket, secured to the prepectoral fascia.  The wound was closed in the normal fashion.  The patient tolerated the procedure without apparent complication apart from the anxiety attack at the beginning.     Deboraha Sprang, MD, [facility]     SCK/MEDQ  D:  08/16/2015  T:  08/16/2015  Job:  [id number]

## 2015-08-16 NOTE — H&P (Addendum)
Patient Care Team: Brunetta Jeans, PA-C as PCP - General (Physician Assistant)   HPI  Debra Holloway is a 59 y.o. female admitted for CRT-D implantation for CHF in the context of NICM and LBBB w persistent LV dysfunction  EF 25%  9/16 Holter demonstrated non significant PVC burden  Symptoms of DOE are stable  She is anxious  Past Medical History  Diagnosis Date  . ADD (attention deficit disorder)   . Environmental allergies   . Chicken pox   . H/O vitamin D deficiency   . Seasonal affective disorder ([place])   . LBBB (left bundle branch block)     W/1st degree AV Block, Avoid Beta Blockers  . AR (allergic rhinitis)   . Depression   . Dyslipidemia   . OSA (obstructive sleep apnea) 03/18/2015    Severe OSA with AHI 30/hr    Past Surgical History  Procedure Laterality Date  . Dilation and curettage of uterus  2003  . Abdominal hysterectomy  2004  . Wisdom tooth extraction  1986  . Left and right heart catheterization with coronary angiogram N/A 11/17/2014    Procedure: LEFT AND RIGHT HEART CATHETERIZATION WITH CORONARY ANGIOGRAM;  Surgeon: Troy Sine, MD;  Location: [facility] LAB;  Service: Cardiovascular;  Laterality: N/A;    Current Facility-Administered Medications  Medication Dose Route Frequency Provider Last Rate Last Dose  . 0.9 %  sodium chloride infusion  250 mL Intravenous Continuous Deboraha Sprang, MD 10 mL/hr at 08/16/15 [phone]    . 0.9 %  sodium chloride infusion   Intravenous Continuous Deboraha Sprang, MD 50 mL/hr at 08/16/15 [phone]    . chlorhexidine (HIBICLENS) 4 % liquid 4 application  60 mL Topical Once Deboraha Sprang, MD      . chlorhexidine (HIBICLENS) 4 % liquid 4 application  60 mL Topical Once Deboraha Sprang, MD      . gentamicin (GARAMYCIN) 80 mg in sodium chloride irrigation 0.9 % 500 mL irrigation  80 mg Irrigation On Call Deboraha Sprang, MD      . sodium chloride 0.9 % injection 3 mL  3 mL Intravenous Q12H Deboraha Sprang, MD      . sodium  chloride 0.9 % injection 3 mL  3 mL Intravenous PRN Deboraha Sprang, MD      . vancomycin (VANCOCIN) IVPB 1000 mg/200 mL premix  1,000 mg Intravenous To SS-Surg Darl Pikes, [facility]         Medication List    ASK your doctor about these medications        acetaminophen 500 MG tablet  Commonly known as:  TYLENOL  Take 500 mg by mouth every 6 (six) hours as needed for moderate pain.     carvedilol 6.25 MG tablet  Commonly known as:  COREG  Take 1.5 tablets (9.375 mg total) by mouth 2 (two) times daily with a meal.     cholecalciferol 1000 UNITS tablet  Commonly known as:  VITAMIN D  Take 2,000 Units by mouth daily.     digoxin 0.125 MG tablet  Commonly known as:  LANOXIN  Take 0.5 tablets (0.0625 mg total) by mouth daily.     fexofenadine 180 MG tablet  Commonly known as:  ALLEGRA  Take 1 tablet (180 mg total) by mouth daily.     fluticasone 50 MCG/ACT nasal spray  Commonly known as:  FLONASE  Place 1 spray into both nostrils daily.  furosemide 20 MG tablet  Commonly known as:  LASIX  Take 1 tablet by mouth every other day     lisdexamfetamine 40 MG capsule  Commonly known as:  VYVANSE  Take 1 capsule (40 mg total) by mouth every morning.     lisinopril 10 MG tablet  Commonly known as:  PRINIVIL,ZESTRIL  Take 1 tablet (10 mg total) by mouth 2 (two) times daily.     pantoprazole 40 MG tablet  Commonly known as:  PROTONIX  Take 1 tablet (40 mg total) by mouth daily.     potassium chloride 10 MEQ tablet  Commonly known as:  K-DUR  Take 2 tablets by mouth every other day     spironolactone 25 MG tablet  Commonly known as:  ALDACTONE  TAKE 1 TABLET(25 MG) BY MOUTH DAILY          Allergies  Allergen Reactions  . Bee Venom Anaphylaxis  . Erythromycin Itching and Rash  . Penicillins Hives, Itching and Rash      Review of Systems negative except from HPI and PMH  Physical Exam BP 106/59 mmHg  Pulse 75  Temp(Src) 98 F (36.7 C) (Oral)  Resp 18  Ht  5\' 6"  (1.676 m)  Wt 208 lb (94.348 kg)  BMI 33.59 kg/m2  SpO2 99% Well developed and well nourished in no acute distress HENT normal E scleral and icterus clear Neck Supple JVP flat; carotids brisk and full Clear to ausculation  Regular rate and rhythm, no murmurs gallops or rub Soft with active bowel sounds No clubbing cyanosis Edema Alert and oriented, grossly normal motor and sensory function Skin Warm and Dry  ECG sinus 65 17/16/45  Assessment and  Plan  LBBB  NICM  CHF- systolic  OSA  Will proceed with CRT-D implantation .Have reviewed the potential benefits and risks of ICD implantation including but not limited to death, perforation of heart or lung, lead dislodgement, infection,  device malfunction and inappropriate shocks.  The patient and family express understanding  and are willing to proceed.    ICD Criteria  Current LVEF:25%. Within 12 months prior to implant: Yes   Heart failure history: Yes, Class III  Cardiomyopathy history: Yes, Non-Ischemic Cardiomyopathy.  Atrial Fibrillation/Atrial Flutter: No.  Ventricular tachycardia history: No.  Cardiac arrest history: Yes, Ventricular Tachycardia.  History of syndromes with risk of sudden death: No.  Previous ICD: No.  Current ICD indication: Primary  PPM indication: Yes. Pacing type: Ventricular. Less than 40% RV pacing requirement anticipated. Indication: CRT    Class I or II Bradycardia indication present: No  Beta Blocker therapy for 3 or more months: Yes, prescribed.   Ace Inhibitor/ARB therapy for 3 or more months: Yes, prescribed.

## 2015-08-16 NOTE — Progress Notes (Signed)
Orthopedic Tech Progress Note Patient Details:  Debra Holloway [birth year] [id number]  Ortho Devices Type of Ortho Device: Arm sling   [place] Pink 08/16/2015, 12:08 PM

## 2015-08-16 NOTE — Progress Notes (Signed)
  Echocardiogram 2D Echocardiogram has been performed.  Johny Chess 08/16/2015, 3:29 PM

## 2015-08-17 ENCOUNTER — Ambulatory Visit (HOSPITAL_COMMUNITY): Payer: BLUE CROSS/BLUE SHIELD

## 2015-08-17 DIAGNOSIS — I447 Left bundle-branch block, unspecified: Secondary | ICD-10-CM

## 2015-08-17 DIAGNOSIS — I5022 Chronic systolic (congestive) heart failure: Secondary | ICD-10-CM | POA: Diagnosis not present

## 2015-08-17 DIAGNOSIS — E785 Hyperlipidemia, unspecified: Secondary | ICD-10-CM | POA: Diagnosis not present

## 2015-08-17 DIAGNOSIS — I429 Cardiomyopathy, unspecified: Secondary | ICD-10-CM | POA: Diagnosis not present

## 2015-08-17 DIAGNOSIS — F329 Major depressive disorder, single episode, unspecified: Secondary | ICD-10-CM | POA: Diagnosis not present

## 2015-08-17 NOTE — Discharge Summary (Signed)
ELECTROPHYSIOLOGY PROCEDURE DISCHARGE SUMMARY    Patient ID: Debra Holloway,  MRN: [id number], DOB/AGE: June 17, 1956 59 y.o.  Admit date: 08/16/2015 Discharge date: 08/17/2015  Primary Care Physician: Leeanne Rio, PA-C Primary Cardiologist: [place] Electrophysiologist: Caryl Comes  Primary Discharge Diagnosis:  NICM, congestive heart failure, LBBB status post CRTD implant this admission  Secondary Discharge Diagnosis:  1.  ADD 2.  Depression 3.  Dyslipidemia 4.  OSA  Allergies  Allergen Reactions  . Bee Venom Anaphylaxis  . Erythromycin Itching and Rash  . Penicillins Hives, Itching and Rash     Procedures This Admission:  1.  Implantation of a MDT CRTD on 08-16-15 by Dr Caryl Comes.  See op note for full details.  There were no immediate post procedure complications. 2.  CXR on 08-17-15 demonstrated no pneumothorax status post device implantation.   Brief HPI: Debra Holloway is a 59 y.o. female was referred to electrophysiology in the outpatient setting for consideration of ICD implantation.  Past medical history includes NICM, CHF, LBBB.  The patient has persistent LV dysfunction despite guideline directed therapy.  Risks, benefits, and alternatives to ICD implantation were reviewed with the patient who wished to proceed.   Hospital Course:  The patient was admitted and underwent implantation of a MDT CRTD with details as outlined above. She had some hypotension post procedure and echo demonstrated no effusion. She was monitored on telemetry overnight which demonstrated sinus rhythm.  Left chest was without hematoma or ecchymosis.  The device was interrogated and found to be functioning normally.  CXR was obtained and demonstrated no pneumothorax status post device implantation.  Wound care, arm mobility, and restrictions were reviewed with the patient.  The patient was examined and considered stable for discharge to home.   The patient's discharge medications include an  ACE-I (Lisinopril) and beta blocker (Coreg).   Physical Exam: Filed Vitals:   08/16/15 2118 08/16/15 2145 08/17/15 0057 08/17/15 0500  BP: 97/39 102/48 96/39 101/37  Pulse: 54  50 55  Temp: 97.7 F (36.5 C)  98 F (36.7 C) 98 F (36.7 C)  TempSrc: Oral  Oral Oral  Resp: 17  14 16   Height:      Weight:    211 lb 11.2 oz (96.026 kg)  SpO2: 100%  97% 98%    GEN- The patient is well appearing, alert and oriented x 3 today.   HEENT: normocephalic, atraumatic; sclera clear, conjunctiva pink; hearing intact; oropharynx clear; neck supple  Lungs- Clear to ausculation bilaterally, normal work of breathing.  No wheezes, rales, rhonchi Heart- Regular rate and rhythm (paced) GI- soft, non-tender, non-distended, bowel sounds present  Extremities- no clubbing, cyanosis, or edema; DP/PT/radial pulses 2+ bilaterally MS- no significant deformity or atrophy Skin- warm and dry, no rash or lesion, left chest without hematoma/ecchymosis Psych- euthymic mood, full affect Neuro- strength and sensation are intact   Labs:   Lab Results  Component Value Date   WBC 6.8 08/16/2015   HGB 11.7* 08/16/2015   HCT 35.4* 08/16/2015   MCV 92.7 08/16/2015   PLT 223 08/16/2015     Recent Labs Lab 08/16/15 0827  NA 139  K 4.3  CL 106  CO2 23  BUN 19  CREATININE 1.36*  CALCIUM 9.6  GLUCOSE 105*    Discharge Medications:    Medication List    TAKE these medications        acetaminophen 500 MG tablet  Commonly known as:  TYLENOL  Take 500 mg by mouth  every 6 (six) hours as needed for moderate pain.     carvedilol 6.25 MG tablet  Commonly known as:  COREG  Take 1.5 tablets (9.375 mg total) by mouth 2 (two) times daily with a meal.     cholecalciferol 1000 UNITS tablet  Commonly known as:  VITAMIN D  Take 2,000 Units by mouth daily.     digoxin 0.125 MG tablet  Commonly known as:  LANOXIN  Take 0.5 tablets (0.0625 mg total) by mouth daily.     fexofenadine 180 MG tablet  Commonly  known as:  ALLEGRA  Take 1 tablet (180 mg total) by mouth daily.     fluticasone 50 MCG/ACT nasal spray  Commonly known as:  FLONASE  Place 1 spray into both nostrils daily.     furosemide 20 MG tablet  Commonly known as:  LASIX  Take 1 tablet by mouth every other day     lisdexamfetamine 40 MG capsule  Commonly known as:  VYVANSE  Take 1 capsule (40 mg total) by mouth every morning.     lisinopril 10 MG tablet  Commonly known as:  PRINIVIL,ZESTRIL  Take 1 tablet (10 mg total) by mouth 2 (two) times daily.     pantoprazole 40 MG tablet  Commonly known as:  PROTONIX  Take 1 tablet (40 mg total) by mouth daily.     potassium chloride 10 MEQ tablet  Commonly known as:  K-DUR  Take 2 tablets by mouth every other day     spironolactone 25 MG tablet  Commonly known as:  ALDACTONE  TAKE 1 TABLET(25 MG) BY MOUTH DAILY        Disposition:  Discharge Instructions    Diet - low sodium heart healthy    Complete by:  As directed      Increase activity slowly    Complete by:  As directed           Follow-up Information    Follow up with [facility] On 09/02/2015.   Specialty:  Cardiology   Why:  at 3:30PM for wound check   Contact information:   [address], [place] [address] [phone]      Follow up with Virl Axe, MD On 11/16/2015.   Specialty:  Cardiology   Why:  at 2:15PM    Contact information:   [address] [address] [phone]       Duration of Discharge Encounter: Greater than 30 minutes including physician time.  Signed, Chanetta Marshall, NP 08/17/2015 11:09 AM     I have seen and examined this patient with Chanetta Marshall.  Agree with above, note added to reflect my findings.  On exam, regular rhythm, no murmurs, lungs clear.  Device interrogation shows stable leads, CXR without acute abnormality.  Plan for follow up in clinic in 10 days for wound check.    Raylene Carmickle M.  Margaretha Mahan MD 08/17/2015 11:19 AM

## 2015-08-17 NOTE — Progress Notes (Signed)
Pt being discharged home. IV has been removed. All discharge instructions have been discussed, all questions have been answered. Pt instructed to call [facility] with any questions.

## 2015-08-17 NOTE — Discharge Instructions (Signed)
° ° °  Supplemental Discharge Instructions for  Pacemaker/Defibrillator Patients  Activity No heavy lifting or vigorous activity with your left/right arm for 6 to 8 weeks.  Do not raise your left/right arm above your head for one week.  Gradually raise your affected arm as drawn below.           __          08-21-15                08-22-15            08-23-15                 08-24-15    NO DRIVING for   1 week  ; you may begin driving on   [id number]  .  WOUND CARE - Keep the wound area clean and dry.  Do not get this area wet for one week. No showers for one week; you may shower on 08-24-15    . - The tape/steri-strips on your wound will fall off; do not pull them off.  No bandage is needed on the site.  DO  NOT apply any creams, oils, or ointments to the wound area. - If you notice any drainage or discharge from the wound, any swelling or bruising at the site, or you develop a fever > 101? F after you are discharged home, call the office at once.  Special Instructions - You are still able to use cellular telephones; use the ear opposite the side where you have your pacemaker/defibrillator.  Avoid carrying your cellular phone near your device. - When traveling through airports, show security personnel your identification card to avoid being screened in the metal detectors.  Ask the security personnel to use the hand wand. - Avoid arc welding equipment, MRI testing (magnetic resonance imaging), TENS units (transcutaneous nerve stimulators).  Call the office for questions about other devices. - Avoid electrical appliances that are in poor condition or are not properly grounded. - Microwave ovens are safe to be near or to operate.  Additional information for defibrillator patients should your device go off: - If your device goes off ONCE and you feel fine afterward, notify the device clinic nurses. - If your device goes off ONCE and you do not feel well afterward, call 911. - If your device  goes off TWICE, call 911. - If your device goes off THREE times in one day, call 911.  DO NOT DRIVE YOURSELF OR A FAMILY MEMBER WITH A DEFIBRILLATOR TO THE HOSPITAL--CALL 911.

## 2015-08-25 ENCOUNTER — Ambulatory Visit (INDEPENDENT_AMBULATORY_CARE_PROVIDER_SITE_OTHER): Payer: BLUE CROSS/BLUE SHIELD | Admitting: *Deleted

## 2015-08-25 ENCOUNTER — Encounter: Payer: Self-pay | Admitting: Internal Medicine

## 2015-08-25 DIAGNOSIS — I5022 Chronic systolic (congestive) heart failure: Secondary | ICD-10-CM | POA: Diagnosis not present

## 2015-08-25 DIAGNOSIS — I428 Other cardiomyopathies: Secondary | ICD-10-CM

## 2015-08-25 DIAGNOSIS — I429 Cardiomyopathy, unspecified: Secondary | ICD-10-CM

## 2015-08-25 DIAGNOSIS — I447 Left bundle-branch block, unspecified: Secondary | ICD-10-CM

## 2015-08-25 LAB — CUP PACEART INCLINIC DEVICE CHECK
Battery Remaining Longevity: 82 mo
Battery Voltage: 3.1 V
Brady Statistic AP VP Percent: 0.45 %
Brady Statistic AP VS Percent: 0.02 %
Brady Statistic AS VP Percent: 98.53 %
Brady Statistic AS VS Percent: 1 %
Brady Statistic RV Percent Paced: 98.47 %
Date Time Interrogation Session: 20161123171818
HIGH POWER IMPEDANCE MEASURED VALUE: 66 Ohm
Implantable Lead Implant Date: 20161114
Implantable Lead Implant Date: 20161114
Implantable Lead Location: 753859
Implantable Lead Model: 4598
Implantable Lead Model: 5076
Lead Channel Impedance Value: 342 Ohm
Lead Channel Impedance Value: 399 Ohm
Lead Channel Impedance Value: 418 Ohm
Lead Channel Impedance Value: 418 Ohm
Lead Channel Impedance Value: 475 Ohm
Lead Channel Impedance Value: 589 Ohm
Lead Channel Impedance Value: 646 Ohm
Lead Channel Pacing Threshold Pulse Width: 0.4 ms
Lead Channel Pacing Threshold Pulse Width: 0.4 ms
Lead Channel Sensing Intrinsic Amplitude: 2.25 mV
Lead Channel Sensing Intrinsic Amplitude: 22 mV
Lead Channel Sensing Intrinsic Amplitude: 23.125 mV
Lead Channel Setting Pacing Amplitude: 3 V
Lead Channel Setting Pacing Amplitude: 3.5 V
Lead Channel Setting Pacing Amplitude: 3.5 V
Lead Channel Setting Pacing Pulse Width: 0.4 ms
Lead Channel Setting Pacing Pulse Width: 0.4 ms
Lead Channel Setting Sensing Sensitivity: 0.3 mV
MDC IDC LEAD IMPLANT DT: 20161114
MDC IDC LEAD LOCATION: 753858
MDC IDC LEAD LOCATION: 753860
MDC IDC MSMT LEADCHNL LV IMPEDANCE VALUE: 285 Ohm
MDC IDC MSMT LEADCHNL LV IMPEDANCE VALUE: 304 Ohm
MDC IDC MSMT LEADCHNL LV IMPEDANCE VALUE: 342 Ohm
MDC IDC MSMT LEADCHNL LV IMPEDANCE VALUE: 475 Ohm
MDC IDC MSMT LEADCHNL LV IMPEDANCE VALUE: 646 Ohm
MDC IDC MSMT LEADCHNL LV PACING THRESHOLD AMPLITUDE: 1 V
MDC IDC MSMT LEADCHNL RA IMPEDANCE VALUE: 456 Ohm
MDC IDC MSMT LEADCHNL RA PACING THRESHOLD AMPLITUDE: 0.5 V
MDC IDC MSMT LEADCHNL RA SENSING INTR AMPL: 1.875 mV
MDC IDC MSMT LEADCHNL RV PACING THRESHOLD AMPLITUDE: 0.625 V
MDC IDC MSMT LEADCHNL RV PACING THRESHOLD PULSEWIDTH: 0.4 ms
MDC IDC STAT BRADY RA PERCENT PACED: 0.47 %

## 2015-08-25 NOTE — Progress Notes (Signed)
Wound check appointment.  Dermabond removed. Wound without redness or edema. Incision edges approximated, wound well healed. Normal device function. Thresholds, sensing, and impedances consistent with implant measurements. Device programmed at 3.5V for extra safety margin until 3 month visit. Histogram distribution appropriate for patient and level of activity. No mode switches or ventricular arrhythmias noted. Patient educated about wound care, arm mobility, lifting restrictions, shock plan. ROV in 3 months with SK.

## 2015-08-30 ENCOUNTER — Other Ambulatory Visit (HOSPITAL_COMMUNITY): Payer: Self-pay | Admitting: Cardiology

## 2015-08-30 ENCOUNTER — Other Ambulatory Visit (HOSPITAL_COMMUNITY): Payer: Self-pay | Admitting: Internal Medicine

## 2015-08-31 ENCOUNTER — Other Ambulatory Visit (HOSPITAL_COMMUNITY): Payer: Self-pay | Admitting: *Deleted

## 2015-08-31 ENCOUNTER — Encounter: Payer: Self-pay | Admitting: Physician Assistant

## 2015-08-31 ENCOUNTER — Ambulatory Visit (INDEPENDENT_AMBULATORY_CARE_PROVIDER_SITE_OTHER): Payer: BLUE CROSS/BLUE SHIELD | Admitting: Physician Assistant

## 2015-08-31 VITALS — BP 99/46 | HR 80 | Temp 97.9°F | Resp 16 | Ht 66.0 in | Wt 208.2 lb

## 2015-08-31 DIAGNOSIS — F909 Attention-deficit hyperactivity disorder, unspecified type: Secondary | ICD-10-CM | POA: Diagnosis not present

## 2015-08-31 DIAGNOSIS — B9689 Other specified bacterial agents as the cause of diseases classified elsewhere: Secondary | ICD-10-CM

## 2015-08-31 DIAGNOSIS — J019 Acute sinusitis, unspecified: Secondary | ICD-10-CM | POA: Diagnosis not present

## 2015-08-31 DIAGNOSIS — F988 Other specified behavioral and emotional disorders with onset usually occurring in childhood and adolescence: Secondary | ICD-10-CM

## 2015-08-31 MED ORDER — DOXYCYCLINE HYCLATE 100 MG PO CAPS: 100.0000 mg | ORAL_CAPSULE | Freq: Two times a day (BID) | ORAL | Status: DC

## 2015-08-31 MED ORDER — PANTOPRAZOLE SODIUM 40 MG PO TBEC: 40.0000 mg | DELAYED_RELEASE_TABLET | Freq: Every day | ORAL | Status: DC | PRN

## 2015-08-31 MED ORDER — LISDEXAMFETAMINE DIMESYLATE 40 MG PO CAPS: 40.0000 mg | ORAL_CAPSULE | ORAL | Status: DC

## 2015-08-31 NOTE — Patient Instructions (Signed)
Please take antibiotic as directed.  Increase fluid intake.  Use Saline nasal spray.  Take a daily multivitamin. Take a daily claritin.  Place a humidifier in the bedroom.  Please call or return clinic if symptoms are not improving.  Sinusitis Sinusitis is redness, soreness, and swelling (inflammation) of the paranasal sinuses. Paranasal sinuses are air pockets within the bones of your face (beneath the eyes, the middle of the forehead, or above the eyes). In healthy paranasal sinuses, mucus is able to drain out, and air is able to circulate through them by way of your nose. However, when your paranasal sinuses are inflamed, mucus and air can become trapped. This can allow bacteria and other germs to grow and cause infection. Sinusitis can develop quickly and last only a short time (acute) or continue over a long period (chronic). Sinusitis that lasts for more than 12 weeks is considered chronic.  CAUSES  Causes of sinusitis include:  Allergies.  Structural abnormalities, such as displacement of the cartilage that separates your nostrils (deviated septum), which can decrease the air flow through your nose and sinuses and affect sinus drainage.  Functional abnormalities, such as when the small hairs (cilia) that line your sinuses and help remove mucus do not work properly or are not present. SYMPTOMS  Symptoms of acute and chronic sinusitis are the same. The primary symptoms are pain and pressure around the affected sinuses. Other symptoms include:  Upper toothache.  Earache.  Headache.  Bad breath.  Decreased sense of smell and taste.  A cough, which worsens when you are lying flat.  Fatigue.  Fever.  Thick drainage from your nose, which often is green and may contain pus (purulent).  Swelling and warmth over the affected sinuses. DIAGNOSIS  Your caregiver will perform a physical exam. During the exam, your caregiver may:  Look in your nose for signs of abnormal growths in your  nostrils (nasal polyps).  Tap over the affected sinus to check for signs of infection.  View the inside of your sinuses (endoscopy) with a special imaging device with a light attached (endoscope), which is inserted into your sinuses. If your caregiver suspects that you have chronic sinusitis, one or more of the following tests may be recommended:  Allergy tests.  Nasal culture A sample of mucus is taken from your nose and sent to a lab and screened for bacteria.  Nasal cytology A sample of mucus is taken from your nose and examined by your caregiver to determine if your sinusitis is related to an allergy. TREATMENT  Most cases of acute sinusitis are related to a viral infection and will resolve on their own within 10 days. Sometimes medicines are prescribed to help relieve symptoms (pain medicine, decongestants, nasal steroid sprays, or saline sprays).  However, for sinusitis related to a bacterial infection, your caregiver will prescribe antibiotic medicines. These are medicines that will help kill the bacteria causing the infection.  Rarely, sinusitis is caused by a fungal infection. In theses cases, your caregiver will prescribe antifungal medicine. For some cases of chronic sinusitis, surgery is needed. Generally, these are cases in which sinusitis recurs more than 3 times per year, despite other treatments. HOME CARE INSTRUCTIONS   Drink plenty of water. Water helps thin the mucus so your sinuses can drain more easily.  Use a humidifier.  Inhale steam 3 to 4 times a day (for example, sit in the bathroom with the shower running).  Apply a warm, moist washcloth to your face 3 to  4 times a day, or as directed by your caregiver.  Use saline nasal sprays to help moisten and clean your sinuses.  Take over-the-counter or prescription medicines for pain, discomfort, or fever only as directed by your caregiver. SEEK IMMEDIATE MEDICAL CARE IF:  You have increasing pain or severe  headaches.  You have nausea, vomiting, or drowsiness.  You have swelling around your face.  You have vision problems.  You have a stiff neck.  You have difficulty breathing. MAKE SURE YOU:   Understand these instructions.  Will watch your condition.  Will get help right away if you are not doing well or get worse. Document Released: 09/18/2005 Document Revised: 12/11/2011 Document Reviewed: 10/03/2011 [facility] Patient Information 2014 [place], [place].

## 2015-08-31 NOTE — Progress Notes (Signed)
Patient presents to clinic today for follow-up of ADD with medication refills. Patient taking Vyvanse as directed with good relief of symptoms. Is sleeping well even with medication.   Patient also c/o 6 days of worsening sinus pressure, sinus pain, PND with ST, sweats and low-grade fever. Denies chest congestion. Has taken tylenol and Zicam with little relief of symptoms.   Past Medical History  Diagnosis Date  . ADD (attention deficit disorder)   . Environmental allergies   . Chicken pox   . H/O vitamin D deficiency   . LBBB (left bundle branch block)     W/1st degree AV Block, Avoid Beta Blockers  . AR (allergic rhinitis)   . Dyslipidemia   . AICD (automatic cardioverter/defibrillator) present 08/30/2015  . CHF (congestive heart failure) ([place]) dx'd 11/2014  . OSA (obstructive sleep apnea) 03/18/2015    Severe OSA with AHI 30/hr; "suppose to wear mask but don't" (08/30/15)  . Seasonal affective disorder ([place])   . Depression     "just when my daddy died a few years ago" (Aug 30, 2015)  . Anxiety     "just when my daddy died a few years ago" (August 30, 2015)    Current Outpatient Prescriptions on File Prior to Visit  Medication Sig Dispense Refill  . acetaminophen (TYLENOL) 500 MG tablet Take 500 mg by mouth every 6 (six) hours as needed for moderate pain.    . carvedilol (COREG) 6.25 MG tablet TAKE 1 AND 1/2 TABLETS BY MOUTH TWICE DAILY WITH A MEAL 90 tablet 3  . cholecalciferol (VITAMIN D) 1000 UNITS tablet Take 2,000 Units by mouth daily.    . digoxin (LANOXIN) 0.125 MG tablet Take 0.5 tablets (0.0625 mg total) by mouth daily. 90 tablet 3  . fexofenadine (ALLEGRA) 180 MG tablet Take 1 tablet (180 mg total) by mouth daily.    . fluticasone (FLONASE) 50 MCG/ACT nasal spray Place 1 spray into both nostrils daily.    . furosemide (LASIX) 20 MG tablet TAKE 1 TABLET BY MOUTH EVERY OTHER DAY 15 tablet 3  . lisinopril (PRINIVIL,ZESTRIL) 10 MG tablet Take 1 tablet (10 mg total) by mouth 2  (two) times daily. 60 tablet 6  . potassium chloride (K-DUR) 10 MEQ tablet Take 2 tablets by mouth every other day (Patient taking differently: Take 20 mEq by mouth every other day. Take with Lasix) 30 tablet 3  . spironolactone (ALDACTONE) 25 MG tablet TAKE 1 TABLET(25 MG) BY MOUTH DAILY 30 tablet 6   No current facility-administered medications on file prior to visit.    Allergies  Allergen Reactions  . Bee Venom Anaphylaxis  . Erythromycin Itching and Rash  . Penicillins Hives, Itching and Rash    Family History  Problem Relation Age of Onset  . COPD Mother     Living  . Pancreatic cancer Father 73    Deceased  . Lung cancer Paternal Grandfather   . Heart disease Paternal Grandmother   . Hypertension Paternal Grandmother   . Alzheimer's disease Maternal Grandfather   . Arthritis/Rheumatoid Maternal Grandmother   . Heart disease Maternal Grandmother     Tachycardia  . Cancer Paternal Uncle   . Breast cancer Sister 16  . Healthy Brother     x3  . Healthy Daughter     x3    Social History   Social History  . Marital Status: Divorced    Spouse Name: N/A  . Number of Children: N/A  . Years of Education: N/A   Social History Main  Topics  . Smoking status: Never Smoker   . Smokeless tobacco: Never Used  . Alcohol Use: 3.0 oz/week    5 Glasses of wine per week  . Drug Use: No  . Sexual Activity: Not Currently   Other Topics Concern  . Not on file   Social History Narrative   Review of Systems - See HPI.  All other ROS are negative.  BP 99/46 mmHg  Pulse 80  Temp(Src) 97.9 F (36.6 C) (Oral)  Resp 16  Ht 5' 6"  (1.676 m)  Wt 208 lb 4 oz (94.462 kg)  BMI 33.63 kg/m2  SpO2 99%  Physical Exam  Constitutional: She is oriented to person, place, and time and well-developed, well-nourished, and in no distress.  HENT:  Head: Normocephalic and atraumatic.  Right Ear: External ear normal.  Left Ear: External ear normal.  Nose: Nose normal.  Mouth/Throat:  Oropharynx is clear and moist. No oropharyngeal exudate.  + TTP of sinuses  Eyes: Conjunctivae are normal.  Neck: Neck supple.  Cardiovascular: Normal rate, regular rhythm, normal heart sounds and intact distal pulses.   Pulmonary/Chest: Effort normal and breath sounds normal. No respiratory distress. She has no wheezes. She has no rales. She exhibits no tenderness.  Neurological: She is alert and oriented to person, place, and time.  Skin: Skin is warm and dry. No rash noted.  Psychiatric: Affect normal.  Vitals reviewed.   Recent Results (from the past 2160 hour(s))  Surgical pcr screen     Status: None   Collection Time: 08/16/15  8:26 AM  Result Value Ref Range   MRSA, PCR NEGATIVE NEGATIVE   Staphylococcus aureus NEGATIVE NEGATIVE    Comment:        The Xpert SA Assay (FDA approved for NASAL specimens in patients over 3 years of age), is one component of a comprehensive surveillance program.  Test performance has been validated by [facility] for patients greater than or equal to 65 year old. It is not intended to diagnose infection nor to guide or monitor treatment.   CBC     Status: Abnormal   Collection Time: 08/16/15  8:27 AM  Result Value Ref Range   WBC 6.8 4.0 - 10.5 K/uL   RBC 3.82 (L) 3.87 - 5.11 MIL/uL   Hemoglobin 11.7 (L) 12.0 - 15.0 g/dL   HCT 35.4 (L) 36.0 - 46.0 %   MCV 92.7 78.0 - 100.0 fL   MCH 30.6 26.0 - 34.0 pg   MCHC 33.1 30.0 - 36.0 g/dL   RDW 13.0 11.5 - 15.5 %   Platelets 223 150 - 400 K/uL  Basic metabolic panel     Status: Abnormal   Collection Time: 08/16/15  8:27 AM  Result Value Ref Range   Sodium 139 135 - 145 mmol/L   Potassium 4.3 3.5 - 5.1 mmol/L   Chloride 106 101 - 111 mmol/L   CO2 23 22 - 32 mmol/L   Glucose, Bld 105 (H) 65 - 99 mg/dL   BUN 19 6 - 20 mg/dL   Creatinine, Ser 1.36 (H) 0.44 - 1.00 mg/dL   Calcium 9.6 8.9 - 10.3 mg/dL   GFR calc non Af Amer 42 (L) >60 mL/min   GFR calc Af Amer 49 (L) >60 mL/min    Comment:  (NOTE) The eGFR has been calculated using the CKD EPI equation. This calculation has not been validated in all clinical situations. eGFR's persistently <60 mL/min signify possible Chronic Kidney Disease.    Anion  gap 10 5 - 15  Protime-INR     Status: None   Collection Time: 08/16/15  8:27 AM  Result Value Ref Range   Prothrombin Time 14.0 11.6 - 15.2 seconds   INR 1.06 0.00 - 1.49  Implantable device check     Status: None   Collection Time: 08/25/15  6:33 PM  Result Value Ref Range   Date Time Interrogation Session [phone]    Pulse Generator Manufacturer MERM    Pulse Gen Model [id number] Viva Quad XT CRT-D    Pulse Gen Serial Number [id number]    Implantable Pulse Generator Type Cardiac Resynch Therapy Defibulator    Implantable Pulse Generator Implant Date [id number]    Implantable Lead Manufacturer [facility]    Implantable Lead Model 4598    Implantable Lead Serial Number [id number]    Implantable Lead Implant Date [id number]    Implantable Lead Location [id number]    Implantable Lead Manufacturer MERM    Implantable Lead Model 5076 CapSureFix Novus    Implantable Lead Serial Number [id number]    Implantable Lead Implant Date [id number]    Implantable Lead Location [id number]    Implantable Lead Manufacturer [facility]    Implantable Lead Model 404-197-1963 Sprint Quattro Secure S    Implantable Lead Serial Number [id number]    Implantable Lead Implant Date [id number]    Implantable Lead Location [id number]    Lead Channel Setting Sensing Sensitivity 0.3 mV   Lead Channel Setting Pacing Amplitude 3.5 V   Lead Channel Setting Pacing Pulse Width 0.4 ms   Lead Channel Setting Pacing Amplitude 3.5 V   Lead Channel Setting Pacing Pulse Width 0.4 ms   Lead Channel Setting Pacing Amplitude 3 V   Lead Channel Setting Pacing Capture Mode Adaptive Capture    Lead Channel Impedance Value 456 ohm   Lead Channel Sensing Intrinsic Amplitude 1.875 mV   Lead Channel Sensing Intrinsic Amplitude 2.25 mV    Lead Channel Pacing Threshold Amplitude 0.5 V   Lead Channel Pacing Threshold Pulse Width 0.4 ms   Lead Channel Impedance Value 418 ohm   Lead Channel Impedance Value 342 ohm   Lead Channel Sensing Intrinsic Amplitude 23.125 mV   Lead Channel Sensing Intrinsic Amplitude 22 mV   Lead Channel Pacing Threshold Amplitude 0.625 V   Lead Channel Pacing Threshold Pulse Width 0.4 ms   HighPow Impedance 66 ohm   Lead Channel Impedance Value 646 ohm   Lead Channel Impedance Value 646 ohm   Lead Channel Impedance Value 589 ohm   Lead Channel Impedance Value 399 ohm   Lead Channel Impedance Value 475 ohm   Lead Channel Impedance Value 475 ohm   Lead Channel Impedance Value 418 ohm   Lead Channel Impedance Value 342 ohm   Lead Channel Impedance Value 304 ohm   Lead Channel Impedance Value 285 ohm   Lead Channel Pacing Threshold Amplitude 1 V   Lead Channel Pacing Threshold Pulse Width 0.4 ms   Battery Status OK    Battery Remaining Longevity 82 mo   Battery Voltage 3.10 V   Brady Statistic RA Percent Paced 0.47 %   Brady Statistic RV Percent Paced 98.47 %   Brady Statistic AP VP Percent 0.45 %   Brady Statistic AS VP Percent 98.53 %   Brady Statistic AP VS Percent 0.02 %   Brady Statistic AS VS Percent 1.00 %   Eval Rhythm SR     Assessment/Plan: Acute bacterial sinusitis Rx Doxycycline.  Increase fluids.  Rest.  Saline nasal spray.  Probiotic.  Mucinex as directed.  Humidifier in bedroom. Start Claritin and resume Flonase.  Call or return to clinic if symptoms are not improving.   ADD (attention deficit disorder) Doing well. Vitals stable. Medications refilled. Follow-up 6 months.

## 2015-08-31 NOTE — Progress Notes (Signed)
Pre visit review using our clinic review tool, if applicable. No additional management support is needed unless otherwise documented below in the visit note/SLS  

## 2015-08-31 NOTE — Assessment & Plan Note (Signed)
Rx Doxycycline.  Increase fluids.  Rest.  Saline nasal spray.  Probiotic.  Mucinex as directed.  Humidifier in bedroom. Start Claritin and resume Flonase.  Call or return to clinic if symptoms are not improving.

## 2015-08-31 NOTE — Assessment & Plan Note (Signed)
Doing well. Vitals stable. Medications refilled. Follow-up 6 months.

## 2015-09-02 ENCOUNTER — Ambulatory Visit: Payer: BLUE CROSS/BLUE SHIELD

## 2015-09-16 ENCOUNTER — Encounter (HOSPITAL_COMMUNITY): Payer: BLUE CROSS/BLUE SHIELD | Admitting: Internal Medicine

## 2015-09-22 ENCOUNTER — Encounter: Payer: Self-pay | Admitting: Licensed Clinical Social Worker

## 2015-09-22 ENCOUNTER — Ambulatory Visit (HOSPITAL_COMMUNITY)
Admission: RE | Admit: 2015-09-22 | Discharge: 2015-09-22 | Disposition: A | Discharge status: Home / Self Care | Payer: BLUE CROSS/BLUE SHIELD | Source: Ambulatory Visit | Attending: Internal Medicine | Admitting: Internal Medicine

## 2015-09-22 VITALS — BP 108/60 | HR 72 | Ht 66.0 in | Wt 208.8 lb

## 2015-09-22 DIAGNOSIS — I447 Left bundle-branch block, unspecified: Secondary | ICD-10-CM | POA: Diagnosis not present

## 2015-09-22 DIAGNOSIS — F988 Other specified behavioral and emotional disorders with onset usually occurring in childhood and adolescence: Secondary | ICD-10-CM | POA: Diagnosis not present

## 2015-09-22 DIAGNOSIS — F4321 Adjustment disorder with depressed mood: Secondary | ICD-10-CM | POA: Insufficient documentation

## 2015-09-22 DIAGNOSIS — I5022 Chronic systolic (congestive) heart failure: Secondary | ICD-10-CM | POA: Insufficient documentation

## 2015-09-22 DIAGNOSIS — G4733 Obstructive sleep apnea (adult) (pediatric): Secondary | ICD-10-CM | POA: Diagnosis not present

## 2015-09-22 MED ORDER — CARVEDILOL 12.5 MG PO TABS: 12.5000 mg | ORAL_TABLET | Freq: Two times a day (BID) | ORAL | Status: DC

## 2015-09-22 NOTE — Progress Notes (Signed)
CSW met with patient in the clinic. Patient was very tearful stating concerns about losing her insurance on 12/31 as well as her apartment due to lack of funds. Patient reports plans to move with her aunt as she wants to avoid any eviction process. She noted that moving with family may help her emotionally as she would have support and not be alone.  Patient feeling overwhelmed with multiple issues. CSW provided support and resources for housing and orange card program. CSW discussed at length the importance of starting a disability application through Social Security Disability as she has been out of work for months due to Heart Failure. CSW encouraged patient to return for supportive counseling and discuss further referral for counseling if needed. CSW will continue to be available and provide support as needed. Jackie Brennan, LCSW [phone] 

## 2015-09-22 NOTE — Addendum Note (Signed)
Encounter addended by: Effie Berkshire, RN on: 09/22/2015 11:48 AM<BR>     Documentation filed: Patient Instructions Section, Orders

## 2015-09-22 NOTE — Patient Instructions (Signed)
INCREASE Carvedilol to 12.5 mg twice daily.  Follow up 2 months.  Do the following things EVERYDAY: 1) Weigh yourself in the morning before breakfast. Write it down and keep it in a log. 2) Take your medicines as prescribed 3) Eat low salt foods-Limit salt (sodium) to 2000 mg per day.  4) Stay as active as you can everyday 5) Limit all fluids for the day to less than 2 liters

## 2015-09-22 NOTE — Progress Notes (Signed)
Patient ID: Debra Holloway, female   DOB: 22-Jan-1956, 59 y.o.   MRN: [id number]   Advanced Heart Failure Clinic Note   Patient ID: Debra Holloway, female   DOB: [birth year], 59 y.o.   MRN: [id number] Primary Cardiologist: Meda Coffee  HPI: Debra Holloway is a 59 year old woman with h/o ADD, LBBB and depression. She was admitted to the hospital on 11/14/2014 with ADHF.  Echocardiogram demonstrated significantly reduced ejection fraction of around 15-20%. Underwent LHC which showed normal coronaries. While in the hospital she experienced significant hypotension and dosages of her meds had to be decreased significantly to the lowest possible doses.  Referred to HF clinic for further evaluation.  Now gradually uptitrating her CHF meds.       She presents today for  HF follow-up. Since last visit she underwent implantation of Medtronic CRT-D by Dr. Caryl Comes in November 2016. Says PVCs are better. However still with NYHA III symptoms. When she is in grocery store does ok as long as she stops at time. She gets SOB after walking up stairs. No significant edema, orthopnea or PND. Weight is stable. Very depressed and tearful. Unable to get a new job. Has lost her house and now moving with aunt. Mother died in Jan 26, 2023.    02/13/15: CMRI- EF 29% RVEF 51% + dyssynchrony. No scar or infiltrative disease.  Echo (6/16):  EF ~30% with significant dyssynchrony, moderate MR Echo (9/16):  EF 25-30%, Grade 1 DD  Labs 3/16: K 4.4 Creatinine 0.88 HIV NR Labs 4/16: K 3.7 Creatinine 0.90.  Labs 8/16: K 4.6 Creatinine 0.84  ROS: All systems negative except as listed in HPI, PMH and Problem List.  Past Medical History  Diagnosis Date  . ADD (attention deficit disorder)   . Environmental allergies   . Chicken pox   . H/O vitamin D deficiency   . LBBB (left bundle branch block)     W/1st degree AV Block, Avoid Beta Blockers  . AR (allergic rhinitis)   . Dyslipidemia   . AICD (automatic cardioverter/defibrillator) present 09-09-15    . CHF (congestive heart failure) ([place]) dx'd 11/2014  . OSA (obstructive sleep apnea) 03/18/2015    Severe OSA with AHI 30/hr; "suppose to wear mask but don't" (09/09/2015)  . Seasonal affective disorder ([place])   . Depression     "just when my daddy died a few years ago" (09-09-2015)  . Anxiety     "just when my daddy died a few years ago" (2015-09-09)    SH:  Social History   Social History  . Marital Status: Divorced    Spouse Name: N/A  . Number of Children: N/A  . Years of Education: N/A   Occupational History  . Not on file.   Social History Main Topics  . Smoking status: Never Smoker   . Smokeless tobacco: Never Used  . Alcohol Use: 3.0 oz/week    5 Glasses of wine per week  . Drug Use: No  . Sexual Activity: Not Currently   Other Topics Concern  . Not on file   Social History Narrative    FH:  Family History  Problem Relation Age of Onset  . COPD Mother     Living  . Pancreatic cancer Father 72    Deceased  . Lung cancer Paternal Grandfather   . Heart disease Paternal Grandmother   . Hypertension Paternal Grandmother   . Alzheimer's disease Maternal Grandfather   . Arthritis/Rheumatoid Maternal Grandmother   . Heart disease Maternal Grandmother  Tachycardia  . Cancer Paternal Uncle   . Breast cancer Sister 56  . Healthy Brother     x3  . Healthy Daughter     x3     Current Outpatient Prescriptions  Medication Sig Dispense Refill  . acetaminophen (TYLENOL) 500 MG tablet Take 500 mg by mouth every 6 (six) hours as needed for moderate pain.    . carvedilol (COREG) 6.25 MG tablet TAKE 1 AND 1/2 TABLETS BY MOUTH TWICE DAILY WITH A MEAL 90 tablet 3  . cholecalciferol (VITAMIN D) 1000 UNITS tablet Take 2,000 Units by mouth daily.    . digoxin (LANOXIN) 0.125 MG tablet Take 0.5 tablets (0.0625 mg total) by mouth daily. 90 tablet 3  . fexofenadine (ALLEGRA) 180 MG tablet Take 1 tablet (180 mg total) by mouth daily.    . fluticasone (FLONASE) 50 MCG/ACT  nasal spray Place 1 spray into both nostrils daily.    . furosemide (LASIX) 20 MG tablet TAKE 1 TABLET BY MOUTH EVERY OTHER DAY 15 tablet 3  . lisdexamfetamine (VYVANSE) 40 MG capsule Take 1 capsule (40 mg total) by mouth every morning. 30 capsule 0  . lisinopril (PRINIVIL,ZESTRIL) 10 MG tablet Take 1 tablet (10 mg total) by mouth 2 (two) times daily. 60 tablet 6  . pantoprazole (PROTONIX) 40 MG tablet Take 1 tablet (40 mg total) by mouth daily as needed (for acid reflux). 30 tablet 3  . potassium chloride (K-DUR,KLOR-CON) 10 MEQ tablet Take 20 mEq by mouth every other day.    . spironolactone (ALDACTONE) 25 MG tablet TAKE 1 TABLET(25 MG) BY MOUTH DAILY 30 tablet 6   No current facility-administered medications for this encounter.    Filed Vitals:   09/22/15 1024  BP: 108/60  Pulse: 72  Height: 5\' 6"  (1.676 m)  Weight: 208 lb 12.8 oz (94.711 kg)  SpO2: 100%    PHYSICAL EXAM:  General:  No resp difficulty. HEENT: normal Neck: supple. JVP 6-7 cm. Carotids 2+ bilaterally; no bruits. No lymphadenopathy or thryomegaly. Cor: PMI normal. Regular rate & rhythm. 2/6 SEM at RSB ICD site ok. Lungs: CTA Abdomen: obese, soft, NT, ND. No hepatosplenomegaly. No bruits or masses. Good bowel sounds. Extremities: no cyanosis, clubbing, rash, edema Neuro: alert & orientedx3, cranial nerves grossly intact. Moves all 4 extremities w/o difficulty. Affect pleasant.  ASSESSMENT & PLAN:  1) Chronic systolic HF: NICM. EF 25-30% by echo in 9/16, stable from 6/16 of about 30%. Normal coronaries on cath. Possible viral CMP. cMRI in 4/16 did not show evidence for infiltrative disease.  She is NYHA III.Marland Kitchen Volume status stable - s/p MDT CRT-D - Continue current lasix dose, spiro 25 mg daily. Increase carvedilol to 12.5 bid - Continue lisinopril 10 mg bid. Will not do Entresto currently due to possible loss of insurance - Continue digoxin 0.125 daily 2) LBBB: Chronic. S/p CRT-D3) OSA: Mild. Unable to tolerate  CPAP.  4) ADD: She continues to struggle with this. Now with severe depression. Will have her meet with SW.  We discussed SSRI but she has tried this in past and didn't tolerate. She feels she will be better living with her aunt. No SI.  I have eecouraged her to apply for at least short-termidisability given severe HF and depression.   Menucha Dicesare,MD 11:17 AM

## 2015-09-22 NOTE — Progress Notes (Signed)
Advanced Heart Failure Medication Review by a Pharmacist   Does the patient  feel that his/her medications are working for him/her?  yes  Has the patient been experiencing any side effects to the medications prescribed?  no  Does the patient measure his/her own blood pressure or blood glucose at home?  no   Does the patient have any problems obtaining medications due to transportation or finances?   no  Understanding of regimen: good Understanding of indications: good Potential of compliance: excellent Patient understands to avoid NSAIDs. Patient understands to avoid decongestants.  Issues to address at subsequent visits: Insurance coverage   Pharmacist comments:  Ms. Allio is a pleasant 59 yo F presenting without a medication list but with good recall of her regimen including dosages. She reports excellent compliance with her medications. She does state that she may be losing her insurance coverage soon d/t not being able to find a new job and not having started the disability process. I did offer for her to speak with Kennyth Lose who may be able to assist with her finding a new apartment and applying for new insurance. She did not have any specific medication-related questions or concerns for me at this time.   Ruta Hinds. Velva Harman, PharmD, BCPS, CPP Clinical Pharmacist Pager: [phone] Phone: [phone] 09/22/2015 10:55 AM    Time with patient: 10 minutes Preparation and documentation time: 2 minutes Total time: 12 minutes

## 2015-11-03 ENCOUNTER — Telehealth (HOSPITAL_COMMUNITY): Payer: Self-pay

## 2015-11-03 NOTE — Telephone Encounter (Signed)
DDS paper work mailed to [place] [facility] # [phone]

## 2015-11-16 ENCOUNTER — Encounter (HOSPITAL_COMMUNITY): Payer: Self-pay | Admitting: Internal Medicine

## 2015-11-16 ENCOUNTER — Encounter: Payer: BLUE CROSS/BLUE SHIELD | Admitting: Internal Medicine

## 2015-11-16 ENCOUNTER — Ambulatory Visit (HOSPITAL_COMMUNITY)
Admission: RE | Admit: 2015-11-16 | Discharge: 2015-11-16 | Disposition: A | Discharge status: Home / Self Care | Payer: BLUE CROSS/BLUE SHIELD | Source: Ambulatory Visit | Attending: Internal Medicine | Admitting: Internal Medicine

## 2015-11-16 VITALS — BP 104/64 | HR 60 | Wt 211.5 lb

## 2015-11-16 DIAGNOSIS — I447 Left bundle-branch block, unspecified: Secondary | ICD-10-CM | POA: Insufficient documentation

## 2015-11-16 DIAGNOSIS — Z79899 Other long term (current) drug therapy: Secondary | ICD-10-CM | POA: Diagnosis not present

## 2015-11-16 DIAGNOSIS — E785 Hyperlipidemia, unspecified: Secondary | ICD-10-CM | POA: Insufficient documentation

## 2015-11-16 DIAGNOSIS — E559 Vitamin D deficiency, unspecified: Secondary | ICD-10-CM | POA: Insufficient documentation

## 2015-11-16 DIAGNOSIS — Z95810 Presence of automatic (implantable) cardiac defibrillator: Secondary | ICD-10-CM | POA: Diagnosis not present

## 2015-11-16 DIAGNOSIS — F329 Major depressive disorder, single episode, unspecified: Secondary | ICD-10-CM | POA: Diagnosis not present

## 2015-11-16 DIAGNOSIS — I5022 Chronic systolic (congestive) heart failure: Secondary | ICD-10-CM | POA: Diagnosis not present

## 2015-11-16 DIAGNOSIS — G4733 Obstructive sleep apnea (adult) (pediatric): Secondary | ICD-10-CM | POA: Insufficient documentation

## 2015-11-16 LAB — DIGOXIN LEVEL

## 2015-11-16 LAB — BASIC METABOLIC PANEL
Anion gap: 11 (ref 5–15)
BUN: 16 mg/dL (ref 6–20)
CO2: 24 mmol/L (ref 22–32)
CREATININE: 0.93 mg/dL (ref 0.44–1.00)
Calcium: 9.5 mg/dL (ref 8.9–10.3)
Chloride: 105 mmol/L (ref 101–111)
GFR calc Af Amer: >60 mL/min (ref >60)
GLUCOSE: 94 mg/dL (ref 65–99)
Potassium: 4.6 mmol/L (ref 3.5–5.1)
SODIUM: 140 mmol/L (ref 135–145)

## 2015-11-16 LAB — BRAIN NATRIURETIC PEPTIDE: B Natriuretic Peptide: 63.5 pg/mL (ref 0.0–100.0)

## 2015-11-16 NOTE — Patient Instructions (Signed)
Routine lab work today. Will notify you of abnormal results  Follow up and echo in 2 months with [place] provider has referred you to [facility].

## 2015-11-16 NOTE — Progress Notes (Signed)
Patient ID: Debra Holloway, female   DOB: [birth year], 60 y.o.   MRN: [id number]   Advanced Heart Failure Clinic Note   Patient ID: Debra Holloway, female   DOB: Feb 13, 1956, 60 y.o.   MRN: [id number] Primary Cardiologist: Debra Holloway  HPI: Debra Holloway is a 60 year old woman with h/o ADD, LBBB and depression. She was admitted to the hospital on 11/14/2014 with ADHF.  Echocardiogram demonstrated significantly reduced ejection fraction of around 15-20%. Underwent LHC which showed normal coronaries. While in the hospital she experienced significant hypotension and dosages of her meds had to be decreased significantly to the lowest possible doses.  Referred to HF clinic for further evaluation.  Now gradually uptitrating her CHF meds.      She underwent implantation of Medtronic CRT-D by Dr. Caryl Holloway in November 2016.  She presents today for  HF follow-up. At last visit we increased carvedilol to 12.5 bid. Feels ok.  Says PVCs are better after CRT-D. Also says feels she can walk farther. She actually walked a mile the other day but was very tired. In grocery store walking the first 3 aisles is no problem but gets winded after that. She gets SOB after walking up stairs. No significant edema, orthopnea or PND. Weight is stable. Compliant with meds. Living with her brother. Still upset about losing her job and mother died in 02-07-23.    01/28/15: CMRI- EF 29% RVEF 51% + dyssynchrony. No scar or infiltrative disease.  Echo (6/16):  EF ~30% with significant dyssynchrony, moderate MR Echo (9/16):  EF 25-30%, Grade 1 DD  Labs 3/16: K 4.4 Creatinine 0.88 HIV NR Labs 4/16: K 3.7 Creatinine 0.90.  Labs 8/16: K 4.6 Creatinine 0.84  ROS: All systems negative except as listed in HPI, PMH and Problem List.  Past Medical History  Diagnosis Date  . ADD (attention deficit disorder)   . Environmental allergies   . Chicken pox   . H/O vitamin D deficiency   . LBBB (left bundle branch block)     W/1st degree AV Block, Avoid Beta  Blockers  . AR (allergic rhinitis)   . Dyslipidemia   . AICD (automatic cardioverter/defibrillator) present 2015-08-24  . CHF (congestive heart failure) ([place]) dx'd 11/2014  . OSA (obstructive sleep apnea) 03/18/2015    Severe OSA with AHI 30/hr; "suppose to wear mask but don't" (August 24, 2015)  . Seasonal affective disorder ([place])   . Depression     "just when my daddy died a few years ago" (August 24, 2015)  . Anxiety     "just when my daddy died a few years ago" (08/24/2015)    SH:  Social History   Social History  . Marital Status: Divorced    Spouse Name: N/A  . Number of Children: N/A  . Years of Education: N/A   Occupational History  . Not on file.   Social History Main Topics  . Smoking status: Never Smoker   . Smokeless tobacco: Never Used  . Alcohol Use: 3.0 oz/week    5 Glasses of wine per week  . Drug Use: No  . Sexual Activity: Not Currently   Other Topics Concern  . Not on file   Social History Narrative    FH:  Family History  Problem Relation Age of Onset  . COPD Mother     Living  . Pancreatic cancer Father 30    Deceased  . Lung cancer Paternal Grandfather   . Heart disease Paternal Grandmother   . Hypertension Paternal Grandmother   . Alzheimer's disease  Maternal Grandfather   . Arthritis/Rheumatoid Maternal Grandmother   . Heart disease Maternal Grandmother     Tachycardia  . Cancer Paternal Uncle   . Breast cancer Sister 56  . Healthy Brother     x3  . Healthy Daughter     x3     Current Outpatient Prescriptions  Medication Sig Dispense Refill  . acetaminophen (TYLENOL) 500 MG tablet Take 500 mg by mouth every 6 (six) hours as needed for moderate pain.    . carvedilol (COREG) 12.5 MG tablet Take 1 tablet (12.5 mg total) by mouth 2 (two) times daily with a meal. 60 tablet 6  . cholecalciferol (VITAMIN D) 1000 UNITS tablet Take 2,000 Units by mouth daily.    . digoxin (LANOXIN) 0.125 MG tablet Take 0.5 tablets (0.0625 mg total) by mouth  daily. 90 tablet 3  . fexofenadine (ALLEGRA) 180 MG tablet Take 1 tablet (180 mg total) by mouth daily.    . fluticasone (FLONASE) 50 MCG/ACT nasal spray Place 1 spray into both nostrils daily.    . furosemide (LASIX) 20 MG tablet TAKE 1 TABLET BY MOUTH EVERY OTHER DAY 15 tablet 3  . lisdexamfetamine (VYVANSE) 40 MG capsule Take 1 capsule (40 mg total) by mouth every morning. 30 capsule 0  . lisinopril (PRINIVIL,ZESTRIL) 10 MG tablet Take 1 tablet (10 mg total) by mouth 2 (two) times daily. 60 tablet 6  . pantoprazole (PROTONIX) 40 MG tablet Take 1 tablet (40 mg total) by mouth daily as needed (for acid reflux). 30 tablet 3  . potassium chloride (K-DUR,KLOR-CON) 10 MEQ tablet Take 20 mEq by mouth every other day.    . spironolactone (ALDACTONE) 25 MG tablet TAKE 1 TABLET(25 MG) BY MOUTH DAILY 30 tablet 6   No current facility-administered medications for this encounter.    Filed Vitals:   11/16/15 1235  BP: 104/64  Pulse: 60  Weight: 211 lb 8 oz (95.936 kg)  SpO2: 98%    PHYSICAL EXAM:  General:  No resp difficulty. HEENT: normal Neck: supple. JVP 5-6 cm. Carotids 2+ bilaterally; no bruits. No lymphadenopathy or thryomegaly. Cor: PMI normal. Regular rate & rhythm. 2/6 SEM at RSB ICD site ok. Lungs: CTA Abdomen: obese, soft, NT, ND. No hepatosplenomegaly. No bruits or masses. Good bowel sounds. Extremities: no cyanosis, clubbing, rash, edema Neuro: alert & orientedx3, cranial nerves grossly intact. Moves all 4 extremities w/o difficulty. Affect pleasant.  ASSESSMENT & PLAN:  1) Chronic systolic HF: NICM. EF 25-30% by echo in 9/16, stable from 6/16 of about 30%. Normal coronaries on cath. Possible viral CMP. cMRI in 4/16 did not show evidence for infiltrative disease.   - Mildly improved. She is NYHA II-III. Volume status stable - s/p MDT CRT-D. Interrogated today in clinic No VT/AF. Volume status looks good. Activity level increasing.  - Continue current lasix dose, spiro and  carvedilol.  - Continue lisinopril 10 mg bid. Still does not want to do Entresto currently due to possible impending loss of insurance - Continue digoxin 0.125 daily 2) LBBB: Chronic. S/p CRT-D 3) OSA: Mild. Unable to tolerate CPAP.  4) ADD and depression: She continues to struggle with this. She has met with SW.  We discussed SSRI but she has tried this in past and didn't tolerate.    Bensimhon, Daniel,MD 1:20 PM

## 2015-11-26 ENCOUNTER — Other Ambulatory Visit (HOSPITAL_COMMUNITY): Payer: Self-pay | Admitting: Cardiology

## 2015-11-26 ENCOUNTER — Other Ambulatory Visit (HOSPITAL_COMMUNITY): Payer: Self-pay | Admitting: Internal Medicine

## 2015-11-26 ENCOUNTER — Encounter: Payer: Self-pay | Admitting: Internal Medicine

## 2015-12-01 ENCOUNTER — Telehealth: Payer: Self-pay | Admitting: Internal Medicine

## 2015-12-01 LAB — HM MAMMOGRAPHY: HM Mammogram: NEGATIVE

## 2015-12-01 NOTE — Telephone Encounter (Signed)
Pt said she had some real funny rhythms in her chest last night,she did push the button.She is also concerned about [facility]

## 2015-12-01 NOTE — Telephone Encounter (Signed)
Patient wants to know [facility]  Sent to device clinic [facility]) her issue about having funny rhythms in her chest during night

## 2015-12-01 NOTE — Telephone Encounter (Signed)
Transmission reviewed- no episodes. Spoke with patient- she reports that she felt "galloping" in her chest. She denies muscle twitching/spasms or a hiccup sensation. I explained that nothing was recorded on the device. She voices relief. I explained that nearly 15 weeks after device implantation, she is free of activity restrictions for cardiac rehab as it relates to her ICD. All autocapture/sensing values stable. Advised to call back if she has any further questions.

## 2015-12-03 ENCOUNTER — Encounter: Payer: Self-pay | Admitting: *Deleted

## 2015-12-09 DIAGNOSIS — Z736 Limitation of activities due to disability: Secondary | ICD-10-CM | POA: Diagnosis not present

## 2015-12-17 ENCOUNTER — Other Ambulatory Visit (HOSPITAL_COMMUNITY): Payer: Self-pay | Admitting: Internal Medicine

## 2016-01-10 ENCOUNTER — Ambulatory Visit (HOSPITAL_BASED_OUTPATIENT_CLINIC_OR_DEPARTMENT_OTHER)
Admission: RE | Admit: 2016-01-10 | Discharge: 2016-01-10 | Disposition: A | Discharge status: Home / Self Care | Payer: BLUE CROSS/BLUE SHIELD | Source: Ambulatory Visit | Attending: Internal Medicine | Admitting: Internal Medicine

## 2016-01-10 ENCOUNTER — Encounter: Payer: Self-pay | Admitting: Internal Medicine

## 2016-01-10 ENCOUNTER — Ambulatory Visit (HOSPITAL_COMMUNITY)
Admission: RE | Admit: 2016-01-10 | Discharge: 2016-01-10 | Disposition: A | Discharge status: Home / Self Care | Payer: BLUE CROSS/BLUE SHIELD | Source: Ambulatory Visit | Attending: Physician Assistant | Admitting: Physician Assistant

## 2016-01-10 ENCOUNTER — Encounter (HOSPITAL_COMMUNITY): Payer: Self-pay | Admitting: Internal Medicine

## 2016-01-10 VITALS — BP 102/60 | HR 63 | Wt 212.0 lb

## 2016-01-10 DIAGNOSIS — I447 Left bundle-branch block, unspecified: Secondary | ICD-10-CM | POA: Insufficient documentation

## 2016-01-10 DIAGNOSIS — I959 Hypotension, unspecified: Secondary | ICD-10-CM | POA: Insufficient documentation

## 2016-01-10 DIAGNOSIS — I509 Heart failure, unspecified: Secondary | ICD-10-CM | POA: Diagnosis present

## 2016-01-10 DIAGNOSIS — I5022 Chronic systolic (congestive) heart failure: Secondary | ICD-10-CM

## 2016-01-10 DIAGNOSIS — E785 Hyperlipidemia, unspecified: Secondary | ICD-10-CM | POA: Insufficient documentation

## 2016-01-10 MED ORDER — FUROSEMIDE 20 MG PO TABS: 20.0000 mg | ORAL_TABLET | Freq: Every day | ORAL | Status: DC | PRN

## 2016-01-10 NOTE — Progress Notes (Signed)
Advanced Heart Failure Medication Review by a Pharmacist  Does the patient  feel that his/her medications are working for him/her?  yes  Has the patient been experiencing any side effects to the medications prescribed?  no  Does the patient measure his/her own blood pressure or blood glucose at home?  yes   Does the patient have any problems obtaining medications due to transportation or finances?   no  Understanding of regimen: excellent Understanding of indications: excellent Potential of compliance: excellent Patient understands to avoid NSAIDs. Patient understands to avoid decongestants.  Pharmacist comments: Reports taking all morning doses prior to visit today. EMR shows order for digoxin 0.125mg  1/2 tab daily however, pt reports taking a whole tablet daily. Complains of dizziness that has increased in intensity and frequency over the past 3 weeks. Denies any recent medication changes. Taking vyvanse for ADD; does not recall ever using atomoxetine (non-stimulant) in the past. All questions regarding medications addressed.    Time with patient: 10 min Preparation and documentation time: 5 min Total time: 15 min  Stephens November, PharmD Clinical Pharmacy Resident 11:51 AM, 01/10/2016

## 2016-01-10 NOTE — Progress Notes (Signed)
Patient ID: Debra Holloway, female   DOB: 1955-10-26, 60 y.o.   MRN: [id number]   Advanced Heart Failure Clinic Note   Patient ID: Debra Holloway, female   DOB: [birth year], 60 y.o.   MRN: [id number] Primary Cardiologist: Meda Coffee  HPI: Debra Holloway is a 60 year old woman with h/o ADD, LBBB and depression. She was admitted to the hospital on 11/14/2014 with ADHF.  Echocardiogram demonstrated LVEF15-20%. Underwent LHC which showed normal coronaries.    She underwent implantation of Medtronic CRT-D by Dr. Caryl Comes in November 2016.  She presents today for  HF follow-up. [facility] However, complaining of dizziness when she exercises or sits up.. Denies SOB/Orthopnea. Watching diet and fluid intake closely.   Echo today in clinic. Reviewed personally EF 40-45%  01/20/2015: CMRI- EF 29% RVEF 51% + dyssynchrony. No scar or infiltrative disease.  Echo (6/16):  EF ~30% with significant dyssynchrony, moderate MR Echo (9/16):  EF 25-30%, Grade 1 DD  Labs 3/16: K 4.4 Creatinine 0.88 HIV NR Labs 4/16: K 3.7 Creatinine 0.90.  Labs 8/16: K 4.6 Creatinine 0.84  ROS: All systems negative except as listed in HPI, PMH and Problem List.  Past Medical History  Diagnosis Date  . ADD (attention deficit disorder)   . Environmental allergies   . Chicken pox   . H/O vitamin D deficiency   . LBBB (left bundle branch block)     W/1st degree AV Block, Avoid Beta Blockers  . AR (allergic rhinitis)   . Dyslipidemia   . AICD (automatic cardioverter/defibrillator) present 09-05-2015  . CHF (congestive heart failure) ([place]) dx'd 11/2014  . OSA (obstructive sleep apnea) 03/18/2015    Severe OSA with AHI 30/hr; "suppose to wear mask but don't" (09/05/15)  . Seasonal affective disorder ([place])   . Depression     "just when my daddy died a few years ago" (Sep 05, 2015)  . Anxiety     "just when my daddy died a few years ago" (09-05-15)    SH:  Social History   Social History  .  Marital Status: Divorced    Spouse Name: N/A  . Number of Children: N/A  . Years of Education: N/A   Occupational History  . Not on file.   Social History Main Topics  . Smoking status: Never Smoker   . Smokeless tobacco: Never Used  . Alcohol Use: 3.0 oz/week    5 Glasses of wine per week  . Drug Use: No  . Sexual Activity: Not Currently   Other Topics Concern  . Not on file   Social History Narrative    FH:  Family History  Problem Relation Age of Onset  . COPD Mother     Living  . Pancreatic cancer Father 80    Deceased  . Lung cancer Paternal Grandfather   . Heart disease Paternal Grandmother   . Hypertension Paternal Grandmother   . Alzheimer's disease Maternal Grandfather   . Arthritis/Rheumatoid Maternal Grandmother   . Heart disease Maternal Grandmother     Tachycardia  . Cancer Paternal Uncle   . Breast cancer Sister 67  . Healthy Brother     x3  . Healthy Daughter     x3     Current Outpatient Prescriptions  Medication Sig Dispense Refill  . acetaminophen (TYLENOL) 500 MG tablet Take 500 mg by mouth every 6 (six) hours as needed for moderate pain.    . carvedilol (COREG) 12.5 MG tablet Take 1 tablet (12.5 mg  total) by mouth 2 (two) times daily with a meal. 60 tablet 3  . cholecalciferol (VITAMIN D) 1000 UNITS tablet Take 2,000 Units by mouth daily.    . digoxin (LANOXIN) 0.125 MG tablet Take 0.125 mg by mouth daily.    . fexofenadine (ALLEGRA) 180 MG tablet Take 1 tablet (180 mg total) by mouth daily.    . fluticasone (FLONASE) 50 MCG/ACT nasal spray Place 1 spray into both nostrils daily.    . furosemide (LASIX) 20 MG tablet TAKE 1 TABLET BY MOUTH EVERY OTHER DAY 15 tablet 0  . lisdexamfetamine (VYVANSE) 40 MG capsule Take 1 capsule (40 mg total) by mouth every morning. 30 capsule 0  . lisinopril (PRINIVIL,ZESTRIL) 10 MG tablet TAKE 1 TABLET BY MOUTH TWICE DAILY 60 tablet 0  . potassium chloride (K-DUR,KLOR-CON) 10 MEQ tablet Take 20 mEq by mouth  every other day.    . spironolactone (ALDACTONE) 25 MG tablet TAKE 1 TABLET(25 MG) BY MOUTH DAILY 30 tablet 0   No current facility-administered medications for this encounter.    Filed Vitals:   01/10/16 1124  BP: 102/60  Pulse: 63  Weight: 212 lb (96.163 kg)  SpO2: 97%    PHYSICAL EXAM:  General:  No resp difficulty. HEENT: normal Neck: supple. JVP flat Carotids 2+ bilaterally; no bruits. No lymphadenopathy or thryomegaly. Cor: PMI normal. Regular rate & rhythm. 2/6 SEM at RSB ICD site ok. Lungs: CTA Abdomen: obese, soft, NT, ND. No hepatosplenomegaly. No bruits or masses. Good bowel sounds. Extremities: no cyanosis, clubbing, rash, edema Neuro: alert & orientedx3, cranial nerves grossly intact. Moves all 4 extremities w/o difficulty. Affect pleasant.  ASSESSMENT & PLAN:  1) Chronic systolic HF: NICM. EF 25-30% by echo in 9/16, stable from 6/16 of about 30%. Normal coronaries on cath. Possible viral CMP. cMRI in 4/16 did not show evidence for infiltrative disease.  Todays ECHO was reviewed by Dr Haroldine Laws. EF has improved to ~40-45%%.  - Greatly improved with CRT-D. She is NYHA II. Volume status low.  - s/p MDT CRT-D. Interrogated today in clinic No VT/AF. Activity level increasing. Optivol flat - Change lasix to as needed.  -Continue current spiro and carvedilol.  - Continue lisinopril 10 mg bid. Still does not want to do Entresto currently due to possible impending loss of insurance - Stop dig.  2) LBBB: Chronic. S/p CRT-D 3) OSA: Mild. Unable to tolerate CPAP.  4) ADD and depression: She continues to struggle with this. She has met with SW.  We discussed SSRI but she has tried this in past and didn't tolerate.   Follow up 6 months.   Amy Clegg,NP-C  11:42 AM  Patient seen and examined with Darrick Grinder, NP. We discussed all aspects of the encounter. I agree with the assessment and plan as stated above.   She is much improved in setting of CRT-D. I am suspicious that she  had LBBB cardiomyopathy. Now NYHA II. Echo and ICD reviewed personally. EF 40-45%. NYHA II. Volume status low. Chang lasix to prn. Liberalize fluids when exercising. Can stop digoxin.   Bensimhon, Daniel,MD 9:31 PM

## 2016-01-10 NOTE — Progress Notes (Signed)
  Echocardiogram 2D Echocardiogram has been performed.  Darlina Sicilian M 01/10/2016, 10:51 AM

## 2016-01-10 NOTE — Patient Instructions (Signed)
STOP Digoxin.  CHANGE Lasix to 20 mg tablet once daily AS NEEDED for weight gain/swelling.  Follow up 3-4 months with Amy Clegg NP-C.  Do the following things EVERYDAY: 1) Weigh yourself in the morning before breakfast. Write it down and keep it in a log. 2) Take your medicines as prescribed 3) Eat low salt foods-Limit salt (sodium) to 2000 mg per day.  4) Stay as active as you can everyday 5) Limit all fluids for the day to less than 2 liters

## 2016-01-11 ENCOUNTER — Encounter (HOSPITAL_COMMUNITY): Payer: Self-pay

## 2016-01-11 ENCOUNTER — Telehealth (HOSPITAL_COMMUNITY): Payer: Self-pay | Admitting: *Deleted

## 2016-01-11 ENCOUNTER — Encounter (HOSPITAL_COMMUNITY): Payer: Self-pay | Admitting: *Deleted

## 2016-01-11 NOTE — Telephone Encounter (Signed)
In reviewing my Debra Holloway record I see that someone has increased my wine intake from 4 glasses per week to 5 and I have no idea why. #1, I certainly have not told anyone that I have increased my wine intake in recent months, especially in light of the fact that I have been treating for heart failure for 14 months. #2, the 4 glasses per week came from when I was initially questioned about that when this all began. In fact, my wine consumption has decreased since my diagnosis. I do remember being asked about alcohol last Fall and I said I ocassionally have wine with dinner; a like a couple glasses on Friday night and Saturday. That is a glass per night, not a couple glasses per night. Please change my medical record to accurately reflect this. There is no reason or indication for my wine consumption to be listed as 5 glasses per week. I have no idea where that comes from. That is extremely inaccurate. Thank you very much.  Received above mess from pt through mychart, upon review of chart alcohol intake was changed while pt was in the hospital in Nov, have updated chart per pt request and sent mess back to pt.

## 2016-01-12 ENCOUNTER — Telehealth (HOSPITAL_COMMUNITY): Payer: Self-pay | Admitting: *Deleted

## 2016-01-12 NOTE — Telephone Encounter (Signed)
Pt called to ask about needed pre meds for dental work.  She states she has an old filling which came out and then she was eating popcorn and the tooth cracked.  She has been calling around to dentist office but they all have her to see an oral surgeon she states she is going to make an appt with one but didn't know if it would be ok for surgery.  Advised pt she does not need antibiotics dental work, if she is seeing a Psychologist, sport and exercise to let htem know she is our pt and they can contact us for clearance, she is agreeable

## 2016-01-17 ENCOUNTER — Encounter: Payer: Self-pay | Admitting: Internal Medicine

## 2016-01-17 ENCOUNTER — Ambulatory Visit (INDEPENDENT_AMBULATORY_CARE_PROVIDER_SITE_OTHER): Payer: BLUE CROSS/BLUE SHIELD | Admitting: Internal Medicine

## 2016-01-17 VITALS — BP 110/50 | HR 60 | Ht 66.0 in | Wt 212.0 lb

## 2016-01-17 DIAGNOSIS — I429 Cardiomyopathy, unspecified: Secondary | ICD-10-CM

## 2016-01-17 DIAGNOSIS — I493 Ventricular premature depolarization: Secondary | ICD-10-CM | POA: Diagnosis not present

## 2016-01-17 DIAGNOSIS — I428 Other cardiomyopathies: Secondary | ICD-10-CM

## 2016-01-17 DIAGNOSIS — I5022 Chronic systolic (congestive) heart failure: Secondary | ICD-10-CM

## 2016-01-17 DIAGNOSIS — Z95810 Presence of automatic (implantable) cardiac defibrillator: Secondary | ICD-10-CM

## 2016-01-17 DIAGNOSIS — I447 Left bundle-branch block, unspecified: Secondary | ICD-10-CM

## 2016-01-17 NOTE — Patient Instructions (Addendum)
Medication Instructions: - Your physician recommends that you continue on your current medications as directed. Please refer to the Current Medication list given to you today.  Labwork: - none  Procedures/Testing: - none  Follow-Up: - Remote monitoring is used to monitor your Pacemaker of ICD from home. This monitoring reduces the number of office visits required to check your device to one time per year. It allows [place] to keep an eye on the functioning of your device to ensure it is working properly. You are scheduled for a device check from home on 04/17/16. You may send your transmission at any time that day. If you have a wireless device, the transmission will be sent automatically. After your physician reviews your transmission, you will receive a postcard with your next transmission date.  - Your physician wants you to follow-up in: November 2017 with Chanetta Marshall, NP for Caryl Comes. You will receive a reminder letter in the mail two months in advance. If you don't receive a letter, please call our office to schedule the follow-up appointment.  Any Additional Special Instructions Will Be Listed Below (If Applicable).     If you need a refill on your cardiac medications before your next appointment, please call your pharmacy.

## 2016-01-17 NOTE — Progress Notes (Signed)
Patient Care Team: Brunetta Jeans, PA-C as PCP - General (Physician Assistant)   HPI  Debra Holloway is a 60 y.o. female Seen in follow-up for CRT-D-Medtronic implanted 11/16 for left bundle branch block with nonischemic cardiomyopathy.  She is significantly improved. She still has exercise intolerance but now at 20-30 minutes instead of 5-10 minutes. She does not have nocturnal dyspnea or edema   sHE HAS faitgue     Results Reviewed   Notes reviewed from heart failure clinic. Echo EF was 40-45%. Digoxin was discontinued  Past Medical History  Diagnosis Date  . ADD (attention deficit disorder)   . Environmental allergies   . Chicken pox   . H/O vitamin D deficiency   . LBBB (left bundle branch block)     W/1st degree AV Block, Avoid Beta Blockers  . AR (allergic rhinitis)   . Dyslipidemia   . AICD (automatic cardioverter/defibrillator) present 08-28-15  . CHF (congestive heart failure) ([place]) dx'd 11/2014  . OSA (obstructive sleep apnea) 03/18/2015    Severe OSA with AHI 30/hr; "suppose to wear mask but don't" (August 28, 2015)  . Seasonal affective disorder ([place])   . Depression     "just when my daddy died a few years ago" (2015/08/28)  . Anxiety     "just when my daddy died a few years ago" (08/28/2015)    Past Surgical History  Procedure Laterality Date  . Dilation and curettage of uterus  2003  . Wisdom tooth extraction  1986  . Left and right heart catheterization with coronary angiogram N/A 11/17/2014    Procedure: LEFT AND RIGHT HEART CATHETERIZATION WITH CORONARY ANGIOGRAM;  Surgeon: Troy Sine, MD;  Location: [facility] CATH LAB;  Service: Cardiovascular;  Laterality: N/A;  . Ep implantable device N/A 28-Aug-2015    Procedure: BiV ICD Insertion CRT-D;  Surgeon: Deboraha Sprang, MD;  Location: [facility] CV LAB;  Service: Cardiovascular;  Laterality: N/A;  . Cardiac catheterization    . Total abdominal hysterectomy  2004  . Tubal ligation      Current Outpatient  Prescriptions  Medication Sig Dispense Refill  . acetaminophen (TYLENOL) 500 MG tablet Take 500 mg by mouth every 6 (six) hours as needed for moderate pain.    . carvedilol (COREG) 12.5 MG tablet Take 1 tablet (12.5 mg total) by mouth 2 (two) times daily with a meal. 60 tablet 3  . cholecalciferol (VITAMIN D) 1000 UNITS tablet Take 2,000 Units by mouth daily.    . fexofenadine (ALLEGRA) 180 MG tablet Take 1 tablet (180 mg total) by mouth daily.    . fluticasone (FLONASE) 50 MCG/ACT nasal spray Place 1 spray into both nostrils daily.    . furosemide (LASIX) 20 MG tablet Take 20 mg by mouth daily as needed for fluid or edema.    Marland Kitchen lisdexamfetamine (VYVANSE) 40 MG capsule Take 1 capsule (40 mg total) by mouth every morning. 30 capsule 0  . lisinopril (PRINIVIL,ZESTRIL) 10 MG tablet TAKE 1 TABLET BY MOUTH TWICE DAILY 60 tablet 0  . potassium chloride (K-DUR,KLOR-CON) 10 MEQ tablet Take 20 mEq by mouth every other day.    . spironolactone (ALDACTONE) 25 MG tablet TAKE 1 TABLET(25 MG) BY MOUTH DAILY 30 tablet 0   No current facility-administered medications for this visit.    Allergies  Allergen Reactions  . Bee Venom Anaphylaxis  . Erythromycin Itching and Rash  . Penicillins Hives, Itching and Rash      Review of Systems negative  except from HPI and PMH  Physical Exam BP 110/50 mmHg  Pulse 60  Ht 5\' 6"  (1.676 m)  Wt 212 lb (96.163 kg)  BMI 34.23 kg/m2 Well developed and well nourished in no acute distress HENT normal E scleral and icterus clear Neck Supple JVP flat; carotids brisk and full Clear to ausculation Device pocket well healed; without hematoma or erythema.  There is no tethering Regular rate and rhythm, no murmurs gallops or rub Soft with active bowel sounds No clubbing cyanosis  Edema Alert and oriented, grossly normal motor and sensory function Skin Warm and Dry  ECG demonstrates P synchronous pacing with a QRS duration 116 ms a . Notably pre-CRT implant QRS  duration with 165 ms.  Assessment and  Plan  Nonischemic cardiomyopathy  Left bundle branch block  Congestive heart failure-chronic-systolic  Anxiety    there has been interval improvement in LV function 20--45. Her left bundle branch block is not evident on ECG, with a narrow QRS.  I've encouraged her to continue to pursue exercise. In addition of encouraged her returning to work. I have been in touch with Dr. Reine Just to think in terms of [facility]

## 2016-01-18 LAB — CUP PACEART INCLINIC DEVICE CHECK
Battery Remaining Longevity: 93 mo
Battery Voltage: 3.03 V
Brady Statistic AS VP Percent: 97.11 %
Brady Statistic AS VS Percent: 0.21 %
Brady Statistic RA Percent Paced: 2.68 %
Brady Statistic RV Percent Paced: 99.72 %
Date Time Interrogation Session: 20170417160421
HIGH POWER IMPEDANCE MEASURED VALUE: 72 Ohm
Implantable Lead Implant Date: 20161114
Implantable Lead Implant Date: 20161114
Implantable Lead Location: 753858
Implantable Lead Location: 753859
Implantable Lead Location: 753860
Implantable Lead Model: 4598
Implantable Lead Model: 5076
Lead Channel Impedance Value: 475 Ohm
Lead Channel Impedance Value: 475 Ohm
Lead Channel Impedance Value: 475 Ohm
Lead Channel Impedance Value: 532 Ohm
Lead Channel Impedance Value: 589 Ohm
Lead Channel Impedance Value: 722 Ohm
Lead Channel Impedance Value: 722 Ohm
Lead Channel Pacing Threshold Amplitude: 0.625 V
Lead Channel Pacing Threshold Amplitude: 0.75 V
Lead Channel Pacing Threshold Amplitude: 1 V
Lead Channel Pacing Threshold Pulse Width: 0.4 ms
Lead Channel Sensing Intrinsic Amplitude: 0.375 mV
Lead Channel Sensing Intrinsic Amplitude: 25.25 mV
Lead Channel Setting Pacing Amplitude: 2 V
Lead Channel Setting Pacing Amplitude: 2.5 V
Lead Channel Setting Pacing Pulse Width: 0.4 ms
Lead Channel Setting Sensing Sensitivity: 0.3 mV
MDC IDC LEAD IMPLANT DT: 20161114
MDC IDC MSMT LEADCHNL LV IMPEDANCE VALUE: 342 Ohm
MDC IDC MSMT LEADCHNL LV IMPEDANCE VALUE: 361 Ohm
MDC IDC MSMT LEADCHNL LV IMPEDANCE VALUE: 361 Ohm
MDC IDC MSMT LEADCHNL LV IMPEDANCE VALUE: 589 Ohm
MDC IDC MSMT LEADCHNL LV IMPEDANCE VALUE: 760 Ohm
MDC IDC MSMT LEADCHNL LV PACING THRESHOLD PULSEWIDTH: 0.4 ms
MDC IDC MSMT LEADCHNL RV IMPEDANCE VALUE: 418 Ohm
MDC IDC MSMT LEADCHNL RV PACING THRESHOLD PULSEWIDTH: 0.4 ms
MDC IDC SET LEADCHNL LV PACING AMPLITUDE: 2.5 V
MDC IDC SET LEADCHNL RV PACING PULSEWIDTH: 0.4 ms
MDC IDC STAT BRADY AP VP PERCENT: 2.66 %
MDC IDC STAT BRADY AP VS PERCENT: 0.02 %

## 2016-01-23 ENCOUNTER — Other Ambulatory Visit (HOSPITAL_COMMUNITY): Payer: Self-pay | Admitting: Internal Medicine

## 2016-01-25 IMAGING — MR MR CARD MORPHOLOGY WO/W CM
7 of 8 series · 15 of 16 positions shown · IV contrast (25    Multihance)
Comparison: none

CLINICAL DATA: 58 year-old-female with dilated cardiomyopathy.

EXAM:
CARDIAC MRI
TECHNIQUE: The patient was scanned on a 1.5 Tesla GE magnet. A dedicated
cardiac coil was used. Functional imaging was done using Fiesta
sequences. [DATE], and 4 chamber views were done to assess for RWMA's.
Modified Eyck rule using a short axis stack was used to
calculate an ejection fraction on a dedicated work station using
Circle software. The patient received 30 cc of Multihance. After 10
minutes inversion recovery sequences were used to assess for
infiltration and scar tissue.
CONTRAST:  30 cc  of Multihance

[Series 3: bSSFP · sagittal · 8.0mm · 1.25mm/px · 1 of 17 slices shown (1 of 4)]
[im 1/17]
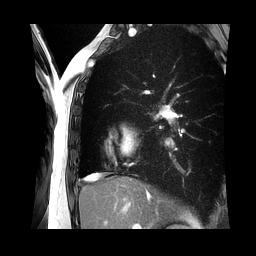

[Series 4: bSSFP · axial · 8.0mm · 1.25mm/px · 1 of 20 slices shown (2 of 4)]
[im 1/20]
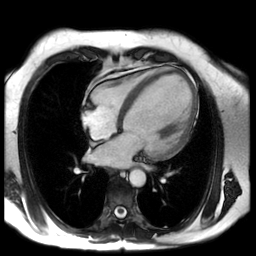

[Series 5: bSSFP · oblique · 8.0mm · 1.37mm/px · 9 of 320 slices shown (3 of 4)]
[im 1/320]
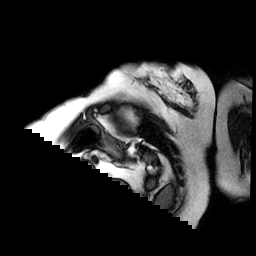
[im 40/320]
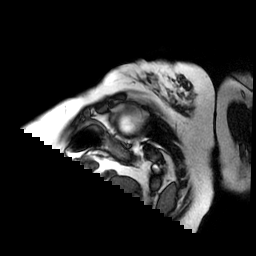
[im 80/320]
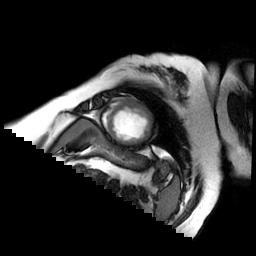
[im 120/320]
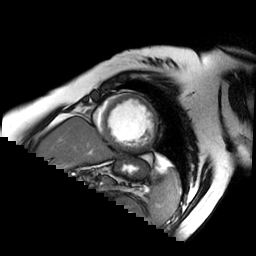
[im 160/320]
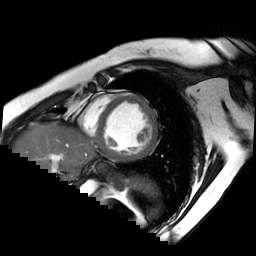
[im 200/320]
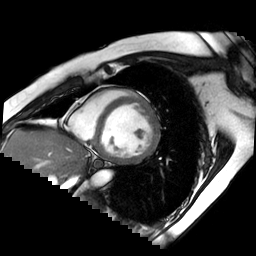
[im 240/320]
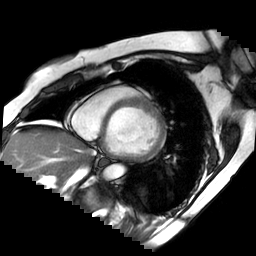
[im 280/320]
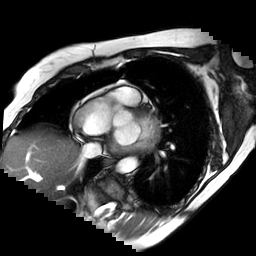
[im 320/320]
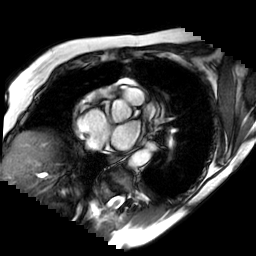

[Series 6: bSSFP · oblique · 8.0mm · 1.37mm/px · 1 of 60 slices shown (4 of 4)]
[im 1/60]
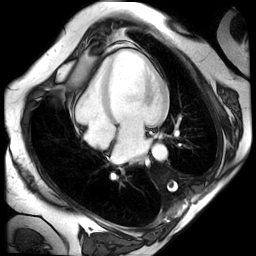

[Series 7: cine ir · oblique · 8.0mm · 1.37mm/px · 1 of 30 slices shown]
[im 1/30]
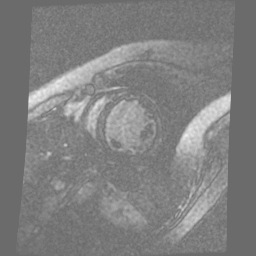

[Series 10: delayed ir prep · oblique · 8.0mm · 1.37mm/px · 1 of 13 slices shown]
[im 1/13]
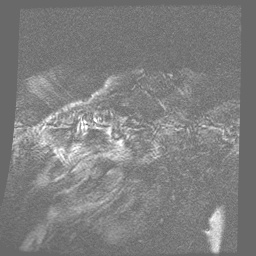

[Series 12: rad delayed ir · oblique · 8.0mm · 1.41mm/px · 1 of 3 slices shown]
[im 1/3]
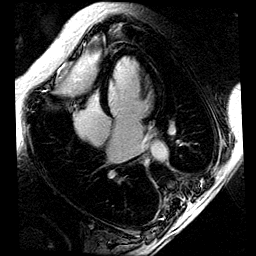

[15 of 16 positions shown; findings below may reference images not displayed]

FINDINGS: 1. Severely dilated left ventricle with normal wall thickness and
severely impaired systolic function (LVEF =29%).

There is severe global hypokinesis with paradoxical septal motion
and dyssynchronous LV wall contraction.

LVEDD:  74 mm

LVESD:  64 mm

Basal anteroseptal wall:  9 mm

Basal inferolateral wall:  9 mm

LVEDV:  314 ml

LVESV:  222 ml

SV:  92 ml

CO:  4.7 L/min

Myocardial mass; 204 g

2. Normal right ventricular size, thickness and systolic function
(RVEF = 51%) with no regional wall motion abnormalities.

3. Mildly dilated left atrium.

4.  Moderate mitral and mild to moderate tricuspid regurgitation.

5. No late gadolinium enhancement was found in the myocardium of the
left and right ventricle.

6. Normal caliber of the aortic root, ascending, descending aorta
and pulmonary artery.
IMPRESSION: 1. Severely dilated left ventricle with normal wall thickness and
severely impaired systolic function (LVEF =29%).

There is severe global hypokinesis with paradoxical septal motion
and dyssynchronous LV wall contraction.

2. Normal right ventricular size, thickness and systolic function
(RVEF = 51%) with no regional wall motion abnormalities.

3. Mildly dilated left atrium.

4.  Moderate mitral and mild to moderate tricuspid regurgitation.

5. No late gadolinium enhancement was found in the myocardium of the
left and right ventricle.

Bollendorff Pacheco [place]

## 2016-02-23 ENCOUNTER — Other Ambulatory Visit (HOSPITAL_COMMUNITY): Payer: Self-pay | Admitting: Internal Medicine

## 2016-03-13 ENCOUNTER — Encounter: Payer: Self-pay | Admitting: Physician Assistant

## 2016-03-13 ENCOUNTER — Ambulatory Visit (INDEPENDENT_AMBULATORY_CARE_PROVIDER_SITE_OTHER): Payer: BLUE CROSS/BLUE SHIELD | Admitting: Physician Assistant

## 2016-03-13 VITALS — BP 102/68 | HR 79 | Resp 16 | Ht 66.0 in | Wt 210.5 lb

## 2016-03-13 DIAGNOSIS — F909 Attention-deficit hyperactivity disorder, unspecified type: Secondary | ICD-10-CM | POA: Diagnosis not present

## 2016-03-13 DIAGNOSIS — Z9109 Other allergy status, other than to drugs and biological substances: Secondary | ICD-10-CM

## 2016-03-13 DIAGNOSIS — I428 Other cardiomyopathies: Secondary | ICD-10-CM

## 2016-03-13 DIAGNOSIS — F988 Other specified behavioral and emotional disorders with onset usually occurring in childhood and adolescence: Secondary | ICD-10-CM

## 2016-03-13 DIAGNOSIS — E785 Hyperlipidemia, unspecified: Secondary | ICD-10-CM | POA: Diagnosis not present

## 2016-03-13 DIAGNOSIS — Z91048 Other nonmedicinal substance allergy status: Secondary | ICD-10-CM | POA: Diagnosis not present

## 2016-03-13 DIAGNOSIS — M546 Pain in thoracic spine: Secondary | ICD-10-CM

## 2016-03-13 DIAGNOSIS — I429 Cardiomyopathy, unspecified: Secondary | ICD-10-CM

## 2016-03-13 LAB — COMPREHENSIVE METABOLIC PANEL
ALT: 12 U/L (ref 0–35)
AST: 15 U/L (ref 0–37)
Albumin: 4.3 g/dL (ref 3.5–5.2)
Alkaline Phosphatase: 53 U/L (ref 39–117)
BILIRUBIN TOTAL: 0.3 mg/dL (ref 0.2–1.2)
BUN: 27 mg/dL — ABNORMAL HIGH (ref 6–23)
CO2: 26 meq/L (ref 19–32)
Calcium: 9.4 mg/dL (ref 8.4–10.5)
Chloride: 103 mEq/L (ref 96–112)
Creatinine, Ser: 1.16 mg/dL (ref 0.40–1.20)
GFR: 50.92 mL/min — AB (ref >60.00)
GLUCOSE: 99 mg/dL (ref 70–99)
Potassium: 4.7 mEq/L (ref 3.5–5.1)
Sodium: 136 mEq/L (ref 135–145)
Total Protein: 7.4 g/dL (ref 6.0–8.3)

## 2016-03-13 LAB — LIPID PANEL
CHOL/HDL RATIO: 5
Cholesterol: 235 mg/dL — ABNORMAL HIGH (ref 0–200)
HDL: 49.8 mg/dL (ref >39.00)
LDL Cholesterol: 154 mg/dL — ABNORMAL HIGH (ref 0–99)
NonHDL: 184.9
Triglycerides: 156 mg/dL — ABNORMAL HIGH (ref 0.0–149.0)
VLDL: 31.2 mg/dL (ref 0.0–40.0)

## 2016-03-13 MED ORDER — MOMETASONE FUROATE 50 MCG/ACT NA SUSP: 2.0000 | Freq: Every day | NASAL | Status: DC

## 2016-03-13 MED ORDER — LISDEXAMFETAMINE DIMESYLATE 40 MG PO CAPS: 40.0000 mg | ORAL_CAPSULE | ORAL | Status: DC

## 2016-03-13 MED ORDER — LISDEXAMFETAMINE DIMESYLATE 40 MG PO CAPS
40.0000 mg | ORAL_CAPSULE | ORAL | Status: DC
Start: 2016-03-13 — End: 2016-03-13

## 2016-03-13 NOTE — Patient Instructions (Signed)
Please go to the lab for blood work. I will call you with your results.  Please continue medications as directed with the following exceptions: - We can stop the Flonase. I have sent Nasonex in to the pharmacy for you.  - Consider starting an OTC B Complex multivitamin.  Follow-up with Cardiology as scheduled.  Schedule a complete physical with me at your earliest convenience.

## 2016-03-13 NOTE — Progress Notes (Signed)
Pre visit review using our clinic review tool, if applicable. No additional management support is needed unless otherwise documented below in the visit note/SLS  

## 2016-03-13 NOTE — Progress Notes (Signed)
Patient presents to clinic today for follow-up of chronic medical problems and to address acute concerns.   ADD -- Is currently on Vyvanse 40 mg daily. Is taking as directed. Has been on this regimen for many years. Endorses good improvement overall with this medication.  Denies side effects of medication.  BP Readings from Last 3 Encounters:  03/13/16 102/68  01/17/16 110/50  01/10/16 102/60   Allergic Rhinitis -- Is currently on Allegra and Flonase. Endorses Nasonex worked better for her and would like to switch back now that she has new insurance.  Hypertension/Nonischemic Cardiomyopathy  -- Endorses taking her lisinopril, Spironolactone and Carvedilol as directed. Patient denies chest pain, palpitations, lightheadedness, dizziness, vision changes or frequent headaches.  BP Readings from Last 3 Encounters:  03/13/16 102/68  01/17/16 110/50  01/10/16 102/60   Patient endorses pain of thoracic back pain and bilateral ribs over the past couple of weeks. Denies trauma or injury. Notes pain started after leaning too far to the right. Felt she heard a snap. Denies bruising, numbness or tenderness. Pain is worse with movement and laying down. Denies chest pain, SOB.   Past Medical History  Diagnosis Date  . ADD (attention deficit disorder)   . Environmental allergies   . Chicken pox   . H/O vitamin D deficiency   . LBBB (left bundle branch block)     W/1st degree AV Block, Avoid Beta Blockers  . AR (allergic rhinitis)   . Dyslipidemia   . AICD (automatic cardioverter/defibrillator) present August 31, 2015  . CHF (congestive heart failure) ([place]) dx'd 11/2014  . OSA (obstructive sleep apnea) 03/18/2015    Severe OSA with AHI 30/hr; "suppose to wear mask but don't" (08-31-15)  . Seasonal affective disorder ([place])   . Depression     "just when my daddy died a few years ago" (31-Aug-2015)  . Anxiety     "just when my daddy died a few years ago" (08-31-15)    Current Outpatient  Prescriptions on File Prior to Visit  Medication Sig Dispense Refill  . acetaminophen (TYLENOL) 500 MG tablet Take 500 mg by mouth every 6 (six) hours as needed for moderate pain.    . carvedilol (COREG) 12.5 MG tablet Take 1 tablet (12.5 mg total) by mouth 2 (two) times daily with a meal. 60 tablet 3  . fexofenadine (ALLEGRA) 180 MG tablet Take 1 tablet (180 mg total) by mouth daily.    . fluticasone (FLONASE) 50 MCG/ACT nasal spray Place 1 spray into both nostrils daily.    . furosemide (LASIX) 20 MG tablet Take 20 mg by mouth daily as needed for fluid or edema.    Marland Kitchen lisdexamfetamine (VYVANSE) 40 MG capsule Take 1 capsule (40 mg total) by mouth every morning. 30 capsule 0  . lisinopril (PRINIVIL,ZESTRIL) 10 MG tablet TAKE 1 TABLET BY MOUTH TWICE DAILY 60 tablet 0  . potassium chloride (K-DUR,KLOR-CON) 10 MEQ tablet Take 20 mEq by mouth every other day. Take with Lasix.    Marland Kitchen spironolactone (ALDACTONE) 25 MG tablet TAKE 1 TABLET(25 MG) BY MOUTH DAILY 30 tablet 0  . cholecalciferol (VITAMIN D) 1000 UNITS tablet Take 2,000 Units by mouth daily. Reported on 03/13/2016     No current facility-administered medications on file prior to visit.    Allergies  Allergen Reactions  . Bee Venom Anaphylaxis  . Erythromycin Itching and Rash  . Penicillins Hives, Itching and Rash    Family History  Problem Relation Age of Onset  . COPD Mother  Living  . Pancreatic cancer Father 4    Deceased  . Lung cancer Paternal Grandfather   . Heart disease Paternal Grandmother   . Hypertension Paternal Grandmother   . Alzheimer's disease Maternal Grandfather   . Arthritis/Rheumatoid Maternal Grandmother   . Heart disease Maternal Grandmother     Tachycardia  . Cancer Paternal Uncle   . Breast cancer Sister 27  . Healthy Brother     x3  . Healthy Daughter     x3    Social History   Social History  . Marital Status: Divorced    Spouse Name: N/A  . Number of Children: N/A  . Years of Education:  N/A   Social History Main Topics  . Smoking status: Never Smoker   . Smokeless tobacco: Never Used  . Alcohol Use: 2.4 oz/week    4 Glasses of wine per week  . Drug Use: No  . Sexual Activity: Not Currently   Other Topics Concern  . None   Social History Narrative   Review of Systems - See HPI.  All other ROS are negative.  BP 102/68 mmHg  Pulse 79  Resp 16  Ht 5' 6"  (1.676 m)  Wt 210 lb 8 oz (95.482 kg)  BMI 33.99 kg/m2  SpO2 99%  Physical Exam  Constitutional: She is oriented to person, place, and time and well-developed, well-nourished, and in no distress.  HENT:  Head: Normocephalic and atraumatic.  Eyes: Conjunctivae are normal.  Neck: Neck supple.  Cardiovascular: Normal rate, regular rhythm, normal heart sounds and intact distal pulses.   Pulmonary/Chest: Effort normal and breath sounds normal. No respiratory distress. She has no wheezes. She has no rales. She exhibits tenderness.  Musculoskeletal:       Thoracic back: She exhibits tenderness. She exhibits normal range of motion, no bony tenderness and no spasm.  Neurological: She is alert and oriented to person, place, and time.  Skin: Skin is warm and dry. No rash noted.  Psychiatric: Affect normal.  Vitals reviewed.   Recent Results (from the past 2160 hour(s))  Implantable device check     Status: None   Collection Time: 01/17/16  4:23 PM  Result Value Ref Range   Date Time Interrogation Session [id number]    Pulse Generator Manufacturer MERM    Pulse Gen Model [id number] Viva Quad XT CRT-D    Pulse Gen Serial Number [id number]    Implantable Pulse Generator Type Cardiac Resynch Therapy Defibulator    Implantable Pulse Generator Implant Date [id number]    Implantable Lead Manufacturer [facility]    Implantable Lead Model 4598    Implantable Lead Serial Number [id number]    Implantable Lead Implant Date 19147829    Implantable Lead Location [id number]    Implantable Lead Manufacturer [facility] Rhonda W     Implantable Lead Model 5076 CapSureFix Novus    Implantable Lead Serial Number [id number]    Implantable Lead Implant Date 56213086    Implantable Lead Location [id number]    Implantable Lead Manufacturer [facility]    Implantable Lead Model 703 797 8864 Sprint Quattro Secure S    Implantable Lead Serial Number [id number]    Implantable Lead Implant Date [id number]    Implantable Lead Location [id number]    Lead Channel Setting Sensing Sensitivity 0.3 mV   Lead Channel Setting Pacing Amplitude 2 V   Lead Channel Setting Pacing Pulse Width 0.4 ms   Lead Channel Setting Pacing Amplitude 2.5 V   Lead Channel Setting Pacing Pulse Width 0.4 ms  Lead Channel Setting Pacing Amplitude 2.5 V   Lead Channel Setting Pacing Capture Mode Adaptive Capture    Lead Channel Impedance Value 475 ohm   Lead Channel Sensing Intrinsic Amplitude 0.375 mV   Lead Channel Pacing Threshold Amplitude 0.625 V   Lead Channel Pacing Threshold Pulse Width 0.4 ms   Lead Channel Impedance Value 532 ohm   Lead Channel Impedance Value 418 ohm   Lead Channel Sensing Intrinsic Amplitude 25.25 mV   Lead Channel Pacing Threshold Amplitude 0.75 V   Lead Channel Pacing Threshold Pulse Width 0.4 ms   HighPow Impedance 72 ohm   Lead Channel Impedance Value 722 ohm   Lead Channel Impedance Value 760 ohm   Lead Channel Impedance Value 722 ohm   Lead Channel Impedance Value 475 ohm   Lead Channel Impedance Value 589 ohm   Lead Channel Impedance Value 589 ohm   Lead Channel Impedance Value 475 ohm   Lead Channel Impedance Value 342 ohm   Lead Channel Impedance Value 361 ohm   Lead Channel Impedance Value 361 ohm   Lead Channel Pacing Threshold Amplitude 1 V   Lead Channel Pacing Threshold Pulse Width 0.4 ms   Battery Status OK    Battery Remaining Longevity 93 mo   Battery Voltage 3.03 V   Brady Statistic RA Percent Paced 2.68 %   Brady Statistic RV Percent Paced 99.72 %   Brady Statistic AP VP Percent 2.66 %   Brady Statistic AS VP Percent  97.11 %   Brady Statistic AP VS Percent 0.02 %   Brady Statistic AS VS Percent 0.21 %   Eval Rhythm NSR     Assessment/Plan: 1. NICM (nonischemic cardiomyopathy) ([place]) Followed by Cardiology. Will check lab panel today. BP stable. Asymptomatic. Continue current regimen.  - Lipid panel - Comp Met (CMET) - Vitamin D 1,25 dihydroxy  2. ADD (attention deficit disorder) Doing very well on current regimen. Medications refilled.  3. Environmental allergies Will continue Allegra. Will d/c Flonase and begin Nasonex as directed. - mometasone (NASONEX) 50 MCG/ACT nasal spray; Place 2 sprays into the nose daily.  Dispense: 17 g; Refill: 12  4. Dyslipidemia Overdue for lipid panel. Is fasting. Will check lipids today. Diet and exercise reviewed. - Lipid panel  5. Bilateral thoracic back pain Muscular. No bony tenderness or abnormality noted. RICE. Heating pad and Icy Hot. Tylenol Arthritis if needed for pain. FU if not resolving.  Leeanne Rio, PA-C

## 2016-03-15 ENCOUNTER — Telehealth: Payer: Self-pay | Admitting: *Deleted

## 2016-03-15 MED ORDER — LOVASTATIN 20 MG PO TABS: 20.0000 mg | ORAL_TABLET | Freq: Every day | ORAL | Status: DC

## 2016-03-15 NOTE — Telephone Encounter (Signed)
Called and spoke with the pt and informed her of Cody's note below.   Pt verbalized understanding.  New prescription sent to the pharmacy.  Pt was scheduled an appt for CPE w/ fasting labs on:(Friday-06-16-16 @ 7:30am).  Informed the pt if she has any questions or concerns after starting the new medication to please give [place] a call back.  Pt agreed.//AB/CMA

## 2016-03-15 NOTE — Telephone Encounter (Signed)
-----   Message from Brunetta Jeans, PA-C sent at 03/15/2016 10:57 AM EDT ----- Recommend lovastatin 20 mg. Ok to send in 30 day supply with 2 refills. FU 3 months.

## 2016-03-17 LAB — VITAMIN D 1,25 DIHYDROXY
Vitamin D 1, 25 (OH)2 Total: 29 pg/mL (ref 18–72)
Vitamin D2 1, 25 (OH)2: <8 pg/mL
Vitamin D3 1, 25 (OH)2: 29 pg/mL

## 2016-03-21 ENCOUNTER — Encounter (HOSPITAL_COMMUNITY): Payer: Self-pay | Admitting: Internal Medicine

## 2016-03-21 ENCOUNTER — Encounter (HOSPITAL_COMMUNITY): Payer: Self-pay

## 2016-03-21 NOTE — Progress Notes (Signed)
[facility] fax received dated 02/11/16 requesting medical records 09/23/15-present. All records 09/22/15-present from CHF clinic and associated providers faxed to provided # [phone]. 47 pages total. Copy of request scanned into patient's electronic medical record. Case # [id number]  Renee Pain

## 2016-03-31 ENCOUNTER — Other Ambulatory Visit (HOSPITAL_COMMUNITY): Payer: Self-pay | Admitting: *Deleted

## 2016-03-31 MED ORDER — SPIRONOLACTONE 25 MG PO TABS: ORAL_TABLET | ORAL | Status: DC

## 2016-04-05 ENCOUNTER — Ambulatory Visit (HOSPITAL_COMMUNITY)
Admission: RE | Admit: 2016-04-05 | Discharge: 2016-04-05 | Disposition: A | Discharge status: Home / Self Care | Payer: BLUE CROSS/BLUE SHIELD | Source: Ambulatory Visit | Attending: Internal Medicine | Admitting: Internal Medicine

## 2016-04-05 VITALS — BP 96/58 | HR 66 | Wt 210.0 lb

## 2016-04-05 DIAGNOSIS — I447 Left bundle-branch block, unspecified: Secondary | ICD-10-CM

## 2016-04-05 DIAGNOSIS — G4733 Obstructive sleep apnea (adult) (pediatric): Secondary | ICD-10-CM | POA: Diagnosis not present

## 2016-04-05 DIAGNOSIS — F329 Major depressive disorder, single episode, unspecified: Secondary | ICD-10-CM | POA: Diagnosis not present

## 2016-04-05 DIAGNOSIS — Z825 Family history of asthma and other chronic lower respiratory diseases: Secondary | ICD-10-CM | POA: Diagnosis not present

## 2016-04-05 DIAGNOSIS — I429 Cardiomyopathy, unspecified: Secondary | ICD-10-CM

## 2016-04-05 DIAGNOSIS — I428 Other cardiomyopathies: Secondary | ICD-10-CM | POA: Diagnosis not present

## 2016-04-05 DIAGNOSIS — F988 Other specified behavioral and emotional disorders with onset usually occurring in childhood and adolescence: Secondary | ICD-10-CM

## 2016-04-05 DIAGNOSIS — E785 Hyperlipidemia, unspecified: Secondary | ICD-10-CM | POA: Diagnosis not present

## 2016-04-05 DIAGNOSIS — F909 Attention-deficit hyperactivity disorder, unspecified type: Secondary | ICD-10-CM

## 2016-04-05 DIAGNOSIS — Z79899 Other long term (current) drug therapy: Secondary | ICD-10-CM | POA: Diagnosis not present

## 2016-04-05 DIAGNOSIS — Z95810 Presence of automatic (implantable) cardiac defibrillator: Secondary | ICD-10-CM | POA: Diagnosis not present

## 2016-04-05 DIAGNOSIS — I5022 Chronic systolic (congestive) heart failure: Secondary | ICD-10-CM | POA: Diagnosis not present

## 2016-04-05 DIAGNOSIS — R42 Dizziness and giddiness: Secondary | ICD-10-CM | POA: Insufficient documentation

## 2016-04-05 DIAGNOSIS — I420 Dilated cardiomyopathy: Secondary | ICD-10-CM

## 2016-04-05 MED ORDER — LISINOPRIL 10 MG PO TABS: 10.0000 mg | ORAL_TABLET | Freq: Every day | ORAL | Status: DC

## 2016-04-05 NOTE — Progress Notes (Signed)
Advanced Heart Failure Clinic Note   Patient ID: Debra Holloway, female   DOB: [birth year], 60 y.o.   MRN: [id number] Primary Cardiologist: Meda Coffee  HPI: Debra Holloway is a 60 year old woman with h/o ADD, LBBB and depression. She was admitted to the hospital on 11/14/2014 with ADHF.  Echocardiogram demonstrated LVEF15-20%. Underwent LHC which showed normal coronaries.    She underwent implantation of Medtronic CRT-D by Dr. Caryl Comes in November 2016.  She returns for HF follow up. Complaining of mild dizziness. SBP at home in the 80s. Weight at home 208-209 pounds. Takes lasix with as needed about twice a month.  Following low salt diet. Taking all medications but has not had meds this morning.    01/20/2015: CMRI- EF 29% RVEF 51% + dyssynchrony. No scar or infiltrative disease.  Echo (6/16):  EF ~30% with significant dyssynchrony, moderate MR Echo (9/16):  EF 25-30%, Grade 1 DD ECHO 01/2016: EF 45-50%.   Labs 3/16: K 4.4 Creatinine 0.88 HIV NR Labs 4/16: K 3.7 Creatinine 0.90.  Labs 8/16: K 4.6 Creatinine 0.84 Labs 03/13/2016: K 4.7 Creatinine 1.16   ROS: All systems negative except as listed in HPI, PMH and Problem List.  Past Medical History  Diagnosis Date  . ADD (attention deficit disorder)   . Environmental allergies   . Chicken pox   . H/O vitamin D deficiency   . LBBB (left bundle branch block)     W/1st degree AV Block, Avoid Beta Blockers  . AR (allergic rhinitis)   . Dyslipidemia   . AICD (automatic cardioverter/defibrillator) present 2015-09-14  . CHF (congestive heart failure) ([place]) dx'd 11/2014  . OSA (obstructive sleep apnea) 03/18/2015    Severe OSA with AHI 30/hr; "suppose to wear mask but don't" (2015/09/14)  . Seasonal affective disorder ([place])   . Depression     "just when my daddy died a few years ago" (September 14, 2015)  . Anxiety     "just when my daddy died a few years ago" (09-14-15)    SH:  Social History   Social History  . Marital Status: Divorced    Spouse  Name: N/A  . Number of Children: N/A  . Years of Education: N/A   Occupational History  . Not on file.   Social History Main Topics  . Smoking status: Never Smoker   . Smokeless tobacco: Never Used  . Alcohol Use: 2.4 oz/week    4 Glasses of wine per week  . Drug Use: No  . Sexual Activity: Not Currently   Other Topics Concern  . Not on file   Social History Narrative    FH:  Family History  Problem Relation Age of Onset  . COPD Mother     Living  . Pancreatic cancer Father 57    Deceased  . Lung cancer Paternal Grandfather   . Heart disease Paternal Grandmother   . Hypertension Paternal Grandmother   . Alzheimer's disease Maternal Grandfather   . Arthritis/Rheumatoid Maternal Grandmother   . Heart disease Maternal Grandmother     Tachycardia  . Cancer Paternal Uncle   . Breast cancer Sister 65  . Healthy Brother     x3  . Healthy Daughter     x3     Current Outpatient Prescriptions  Medication Sig Dispense Refill  . acetaminophen (TYLENOL) 500 MG tablet Take 500 mg by mouth every 6 (six) hours as needed for moderate pain.    Marland Kitchen b complex vitamins tablet Take 1 tablet by mouth daily.    Marland Kitchen  carvedilol (COREG) 12.5 MG tablet Take 1 tablet (12.5 mg total) by mouth 2 (two) times daily with a meal. 60 tablet 3  . fexofenadine (ALLEGRA) 180 MG tablet Take 1 tablet (180 mg total) by mouth daily.    . furosemide (LASIX) 20 MG tablet Take 20 mg by mouth daily as needed for fluid or edema.    Marland Kitchen lisdexamfetamine (VYVANSE) 40 MG capsule Take 1 capsule (40 mg total) by mouth every morning. 30 capsule 0  . lisinopril (PRINIVIL,ZESTRIL) 10 MG tablet TAKE 1 TABLET BY MOUTH TWICE DAILY 60 tablet 0  . mometasone (NASONEX) 50 MCG/ACT nasal spray Place 2 sprays into the nose daily. 17 g 12  . potassium chloride (K-DUR,KLOR-CON) 10 MEQ tablet Take 20 mEq by mouth as needed (when take lasix).     Marland Kitchen spironolactone (ALDACTONE) 25 MG tablet TAKE 1 TABLET(25 MG) BY MOUTH DAILY (Patient not  taking: Reported on 04/05/2016) 30 tablet 6   No current facility-administered medications for this encounter.    Filed Vitals:   04/05/16 1047  BP: 96/58  Pulse: 66  Weight: 210 lb (95.255 kg)  SpO2: 98%    PHYSICAL EXAM:  General:  No resp difficulty. Ambulated in the clinic.  HEENT: normal Neck: supple. JVP flat Carotids 2+ bilaterally; no bruits. No lymphadenopathy or thryomegaly. Cor: PMI normal. Regular rate & rhythm. 2/6 SEM at RSB ICD site ok. Lungs: CTA Abdomen: obese, soft, NT, ND. No hepatosplenomegaly. No bruits or masses. Good bowel sounds. Extremities: no cyanosis, clubbing, rash, edema Neuro: alert & orientedx3, cranial nerves grossly intact. Moves all 4 extremities w/o difficulty. Affect pleasant.  ASSESSMENT & PLAN:  1) Chronic systolic HF: NICM. EF 25-30% by echo in 9/16, stable from 6/16 of about 30%. Normal coronaries on cath. Possible viral CMP. cMRI in 4/16 did not show evidence for infiltrative disease.  Has Mectronic Optivol CRT-D . Fluid ok today. Activity around 3 hours per day.  ECHO from April 2017 with improved EF 45-50%.  NYHA II. Change lasix to as needed. Continue current spiro and carvedilol.  - SBP at home in 80s for this reason I cut back lisinopril to 10 mg at bedtime.   2) LBBB: Chronic. S/p CRT-D 3) OSA: Mild. Unable to tolerate CPAP.  4) ADD and depression: Per PCP  Follow up 6 months.    Amy Clegg,NP-C  11:17 AM

## 2016-04-05 NOTE — Progress Notes (Signed)
Advanced Heart Failure Medication Review by a Pharmacist  Does the patient  feel that his/her medications are working for him/her?  yes  Has the patient been experiencing any side effects to the medications prescribed?  no  Does the patient measure his/her own blood pressure or blood glucose at home?  yes   Does the patient have any problems obtaining medications due to transportation or finances?   no  Understanding of regimen: good Understanding of indications: good Potential of compliance: good Patient understands to avoid NSAIDs. Patient understands to avoid decongestants.  Issues to address at subsequent visits: None   Pharmacist comments:  Debra Holloway is a pleasant 60 yo F presenting without a medication list but with good recall of her regimen. She reports good compliance but does state that she has been out of spironolactone since last Friday because she ran out of refills and there was some miscommunication with the pharmacy. Will verify with [facility] in [place] that they were able to refill it.   Ruta Hinds. Velva Harman, PharmD, BCPS, CPP Clinical Pharmacist Pager: [phone] Phone: [phone] 04/05/2016 11:23 AM      Time with patient: 10 minutes Preparation and documentation time: 2 minutes Total time: 12 minutes

## 2016-04-05 NOTE — Patient Instructions (Signed)
CHANGE Lisinopril to 10 mg daily at bedtime  Your physician recommends that you schedule a follow-up appointment in: 6 months  Do the following things EVERYDAY: 1) Weigh yourself in the morning before breakfast. Write it down and keep it in a log. 2) Take your medicines as prescribed 3) Eat low salt foods-Limit salt (sodium) to 2000 mg per day.  4) Stay as active as you can everyday 5) Limit all fluids for the day to less than 2 liters 6)

## 2016-04-17 ENCOUNTER — Other Ambulatory Visit (HOSPITAL_COMMUNITY): Payer: Self-pay | Admitting: *Deleted

## 2016-04-17 ENCOUNTER — Ambulatory Visit (INDEPENDENT_AMBULATORY_CARE_PROVIDER_SITE_OTHER): Payer: BLUE CROSS/BLUE SHIELD | Admitting: *Deleted

## 2016-04-17 DIAGNOSIS — Z95810 Presence of automatic (implantable) cardiac defibrillator: Secondary | ICD-10-CM | POA: Diagnosis not present

## 2016-04-17 DIAGNOSIS — I428 Other cardiomyopathies: Secondary | ICD-10-CM

## 2016-04-17 DIAGNOSIS — I429 Cardiomyopathy, unspecified: Secondary | ICD-10-CM

## 2016-04-17 MED ORDER — POTASSIUM CHLORIDE CRYS ER 10 MEQ PO TBCR: 20.0000 meq | EXTENDED_RELEASE_TABLET | ORAL | Status: DC | PRN

## 2016-04-17 NOTE — Progress Notes (Signed)
Remote ICD transmission.   

## 2016-04-18 ENCOUNTER — Encounter: Payer: Self-pay | Admitting: Internal Medicine

## 2016-04-18 LAB — CUP PACEART REMOTE DEVICE CHECK
Battery Remaining Longevity: 79 mo
Brady Statistic AP VP Percent: 2.37 %
Brady Statistic AP VS Percent: 0.03 %
Brady Statistic AS VS Percent: 0.09 %
Brady Statistic RV Percent Paced: 99.81 %
Date Time Interrogation Session: 20170717114625
HighPow Impedance: 75 Ohm
Implantable Lead Implant Date: 20161114
Implantable Lead Implant Date: 20161114
Implantable Lead Implant Date: 20161114
Implantable Lead Location: 753860
Implantable Lead Model: 4598
Implantable Lead Model: 5076
Lead Channel Impedance Value: 342 Ohm
Lead Channel Impedance Value: 342 Ohm
Lead Channel Impedance Value: 361 Ohm
Lead Channel Impedance Value: 456 Ohm
Lead Channel Impedance Value: 456 Ohm
Lead Channel Impedance Value: 589 Ohm
Lead Channel Impedance Value: 722 Ohm
Lead Channel Impedance Value: 760 Ohm
Lead Channel Impedance Value: 760 Ohm
Lead Channel Pacing Threshold Amplitude: 0.625 V
Lead Channel Pacing Threshold Amplitude: 1.125 V
Lead Channel Pacing Threshold Pulse Width: 0.4 ms
Lead Channel Pacing Threshold Pulse Width: 0.4 ms
Lead Channel Sensing Intrinsic Amplitude: 1 mV
Lead Channel Sensing Intrinsic Amplitude: 1 mV
Lead Channel Sensing Intrinsic Amplitude: 27.375 mV
Lead Channel Setting Pacing Amplitude: 2 V
Lead Channel Setting Pacing Amplitude: 2.75 V
Lead Channel Setting Pacing Pulse Width: 0.4 ms
Lead Channel Setting Pacing Pulse Width: 0.4 ms
Lead Channel Setting Sensing Sensitivity: 0.3 mV
MDC IDC LEAD LOCATION: 753858
MDC IDC LEAD LOCATION: 753859
MDC IDC MSMT BATTERY VOLTAGE: 3.01 V
MDC IDC MSMT LEADCHNL LV IMPEDANCE VALUE: 475 Ohm
MDC IDC MSMT LEADCHNL LV IMPEDANCE VALUE: 589 Ohm
MDC IDC MSMT LEADCHNL LV PACING THRESHOLD PULSEWIDTH: 0.4 ms
MDC IDC MSMT LEADCHNL RA IMPEDANCE VALUE: 475 Ohm
MDC IDC MSMT LEADCHNL RV IMPEDANCE VALUE: 361 Ohm
MDC IDC MSMT LEADCHNL RV PACING THRESHOLD AMPLITUDE: 0.75 V
MDC IDC MSMT LEADCHNL RV SENSING INTR AMPL: 27.375 mV
MDC IDC SET LEADCHNL RV PACING AMPLITUDE: 2.5 V
MDC IDC STAT BRADY AS VP PERCENT: 97.51 %
MDC IDC STAT BRADY RA PERCENT PACED: 2.4 %

## 2016-04-19 ENCOUNTER — Encounter: Payer: Self-pay | Admitting: Cardiology

## 2016-06-16 ENCOUNTER — Ambulatory Visit (INDEPENDENT_AMBULATORY_CARE_PROVIDER_SITE_OTHER): Payer: BLUE CROSS/BLUE SHIELD | Admitting: Physician Assistant

## 2016-06-16 ENCOUNTER — Encounter: Payer: Self-pay | Admitting: Physician Assistant

## 2016-06-16 ENCOUNTER — Telehealth: Payer: Self-pay | Admitting: *Deleted

## 2016-06-16 ENCOUNTER — Other Ambulatory Visit: Payer: BLUE CROSS/BLUE SHIELD

## 2016-06-16 VITALS — BP 116/72 | HR 69 | Temp 98.3°F | Resp 16 | Ht 66.0 in | Wt 211.0 lb

## 2016-06-16 DIAGNOSIS — F909 Attention-deficit hyperactivity disorder, unspecified type: Secondary | ICD-10-CM

## 2016-06-16 DIAGNOSIS — I429 Cardiomyopathy, unspecified: Secondary | ICD-10-CM

## 2016-06-16 DIAGNOSIS — F4321 Adjustment disorder with depressed mood: Secondary | ICD-10-CM

## 2016-06-16 DIAGNOSIS — I428 Other cardiomyopathies: Secondary | ICD-10-CM

## 2016-06-16 DIAGNOSIS — Z0000 Encounter for general adult medical examination without abnormal findings: Secondary | ICD-10-CM | POA: Diagnosis not present

## 2016-06-16 DIAGNOSIS — F988 Other specified behavioral and emotional disorders with onset usually occurring in childhood and adolescence: Secondary | ICD-10-CM

## 2016-06-16 DIAGNOSIS — R8290 Unspecified abnormal findings in urine: Secondary | ICD-10-CM

## 2016-06-16 LAB — LIPID PANEL
CHOL/HDL RATIO: 4
Cholesterol: 236 mg/dL — ABNORMAL HIGH (ref 0–200)
HDL: 56.9 mg/dL (ref >39.00)
LDL CALC: 156 mg/dL — AB (ref 0–99)
NonHDL: 179.55
Triglycerides: 116 mg/dL (ref 0.0–149.0)
VLDL: 23.2 mg/dL (ref 0.0–40.0)

## 2016-06-16 LAB — URINALYSIS, ROUTINE W REFLEX MICROSCOPIC
BILIRUBIN URINE: NEGATIVE
Ketones, ur: NEGATIVE
NITRITE: NEGATIVE
Specific Gravity, Urine: 1.025 (ref 1.000–1.030)
TOTAL PROTEIN, URINE-UPE24: NEGATIVE
Urine Glucose: NEGATIVE
Urobilinogen, UA: 0.2 (ref 0.0–1.0)
pH: 5 (ref 5.0–8.0)

## 2016-06-16 LAB — CBC
HCT: 37.2 % (ref 36.0–46.0)
Hemoglobin: 12.6 g/dL (ref 12.0–15.0)
MCHC: 34 g/dL (ref 30.0–36.0)
MCV: 90 fl (ref 78.0–100.0)
PLATELETS: 260 10*3/uL (ref 150.0–400.0)
RBC: 4.13 Mil/uL (ref 3.87–5.11)
RDW: 13.6 % (ref 11.5–15.5)
WBC: 7.6 10*3/uL (ref 4.0–10.5)

## 2016-06-16 LAB — COMPREHENSIVE METABOLIC PANEL
ALBUMIN: 4 g/dL (ref 3.5–5.2)
ALK PHOS: 62 U/L (ref 39–117)
ALT: 25 U/L (ref 0–35)
AST: 20 U/L (ref 0–37)
BUN: 17 mg/dL (ref 6–23)
CO2: 29 mEq/L (ref 19–32)
CREATININE: 0.99 mg/dL (ref 0.40–1.20)
Calcium: 9.2 mg/dL (ref 8.4–10.5)
Chloride: 104 mEq/L (ref 96–112)
GFR: 61.08 mL/min (ref >60.00)
Glucose, Bld: 91 mg/dL (ref 70–99)
Potassium: 4.2 mEq/L (ref 3.5–5.1)
SODIUM: 139 meq/L (ref 135–145)
Total Bilirubin: 0.5 mg/dL (ref 0.2–1.2)
Total Protein: 7.2 g/dL (ref 6.0–8.3)

## 2016-06-16 LAB — HEMOGLOBIN A1C: Hgb A1c MFr Bld: 5.4 % (ref 4.6–6.5)

## 2016-06-16 LAB — TSH: TSH: 3.51 u[IU]/mL (ref 0.35–4.50)

## 2016-06-16 MED ORDER — VORTIOXETINE HBR 10 MG PO TABS: 10.0000 mg | ORAL_TABLET | Freq: Every day | ORAL | 2 refills | Status: DC

## 2016-06-16 NOTE — Telephone Encounter (Signed)
Initiated PA for [employer] via [place], [id number]; received Approval 06/16/16 thru 10/01/2038 via web, patient & Meyer informed/SLS 09/15 Cover My Meds Approved today  Effective from 06/16/2016 through 10/01/2038.

## 2016-06-16 NOTE — Telephone Encounter (Signed)
Received Abnormal urine findings in Lab Results; faxed add-on order to [facility] for Urine culture/SLS 09/15

## 2016-06-16 NOTE — Progress Notes (Signed)
Patient presents to clinic today for annual exam.  Patient is fasting for labs.  Chronic Issues: Situational Depression -- Due to health issues. Endorses emotional lability with crying easily. Also notes anhedonia. Is affecting daily life. Denies SI/HI. Would like to discuss treatment options.  Nonischemic Cardiomyopathy -- Followed by Cardiology s/p ICD placement. Is on multi-drug regimen. Is taking as directed. Patient denies chest pain, palpitations, lightheadedness, dizziness, vision changes or frequent headaches.  BP Readings from Last 3 Encounters:  06/16/16 116/72  04/05/16 (!) 96/58  03/13/16 102/68   ADD -- Has done very well with Vyvanse at current dose. Is taking as directed but does not take on weekends. Denies side effects of medication.   Health Maintenance: Immunizations -- up-to-date on Tetanus. Declines flu shot today. Colonoscopy -- Patient declines colonoscopy. Will place order for cologuard. Mammogram -- Up-to-date 12/01/15 PAP -- s/p hysterectomy  Past Medical History:  Diagnosis Date  . ADD (attention deficit disorder)   . AICD (automatic cardioverter/defibrillator) present 20-Aug-2015  . Anxiety    "just when my daddy died a few years ago" (Aug 20, 2015)  . AR (allergic rhinitis)   . CHF (congestive heart failure) ([place]) dx'd 11/2014  . Chicken pox   . Depression    "just when my daddy died a few years ago" (08/20/2015)  . Dyslipidemia   . Environmental allergies   . H/O vitamin D deficiency   . LBBB (left bundle branch block)    W/1st degree AV Block, Avoid Beta Blockers  . OSA (obstructive sleep apnea) 03/18/2015   Severe OSA with AHI 30/hr; "suppose to wear mask but don't" (08/20/2015)  . Seasonal affective disorder [facility])     Past Surgical History:  Procedure Laterality Date  . CARDIAC CATHETERIZATION    . DILATION AND CURETTAGE OF UTERUS  2003  . EP IMPLANTABLE DEVICE N/A Aug 20, 2015   Procedure: BiV ICD Insertion CRT-D;  Surgeon: Deboraha Sprang,  MD;  Location: [facility] LAB;  Service: Cardiovascular;  Laterality: N/A;  . LEFT AND RIGHT HEART CATHETERIZATION WITH CORONARY ANGIOGRAM N/A 11/17/2014   Procedure: LEFT AND RIGHT HEART CATHETERIZATION WITH CORONARY ANGIOGRAM;  Surgeon: Troy Sine, MD;  Location: [facility] CATH LAB;  Service: Cardiovascular;  Laterality: N/A;  . TOTAL ABDOMINAL HYSTERECTOMY  2004  . TUBAL LIGATION    . WISDOM TOOTH EXTRACTION  1986    Current Outpatient Prescriptions on File Prior to Visit  Medication Sig Dispense Refill  . acetaminophen (TYLENOL) 500 MG tablet Take 500 mg by mouth every 6 (six) hours as needed for moderate pain.    Marland Kitchen b complex vitamins tablet Take 1 tablet by mouth daily.    . carvedilol (COREG) 12.5 MG tablet Take 1 tablet (12.5 mg total) by mouth 2 (two) times daily with a meal. 60 tablet 3  . fexofenadine (ALLEGRA) 180 MG tablet Take 1 tablet (180 mg total) by mouth daily.    . furosemide (LASIX) 20 MG tablet Take 20 mg by mouth daily as needed for fluid or edema.    Marland Kitchen lisdexamfetamine (VYVANSE) 40 MG capsule Take 1 capsule (40 mg total) by mouth every morning. 30 capsule 0  . lisinopril (PRINIVIL,ZESTRIL) 10 MG tablet Take 1 tablet (10 mg total) by mouth at bedtime. 30 tablet 2  . mometasone (NASONEX) 50 MCG/ACT nasal spray Place 2 sprays into the nose daily. 17 g 12  . potassium chloride (K-DUR,KLOR-CON) 10 MEQ tablet Take 2 tablets (20 mEq total) by mouth as needed (when take  lasix). 30 tablet 3  . spironolactone (ALDACTONE) 25 MG tablet TAKE 1 TABLET(25 MG) BY MOUTH DAILY 30 tablet 6   No current facility-administered medications on file prior to visit.     Allergies  Allergen Reactions  . Bee Venom Anaphylaxis  . Erythromycin Itching and Rash  . Lovastatin Other (See Comments)    weakness  . Penicillins Hives, Itching and Rash  . Simvastatin Other (See Comments)    weakness    Family History  Problem Relation Age of Onset  . COPD Mother     Living  . Pancreatic  cancer Father 12    Deceased  . Lung cancer Paternal Grandfather   . Heart disease Paternal Grandmother   . Hypertension Paternal Grandmother   . Alzheimer's disease Maternal Grandfather   . Arthritis/Rheumatoid Maternal Grandmother   . Heart disease Maternal Grandmother     Tachycardia  . Cancer Paternal Uncle   . Breast cancer Sister 46  . Healthy Brother     x3  . Healthy Daughter     x3    Social History   Social History  . Marital status: Divorced    Spouse name: N/A  . Number of children: N/A  . Years of education: N/A   Occupational History  . Not on file.   Social History Main Topics  . Smoking status: Never Smoker  . Smokeless tobacco: Never Used  . Alcohol use 2.4 oz/week    4 Glasses of wine per week  . Drug use: No  . Sexual activity: Not Currently   Other Topics Concern  . Not on file   Social History Narrative  . No narrative on file   Review of Systems  Constitutional: Negative for fever and weight loss.  HENT: Negative for ear discharge, ear pain, hearing loss and tinnitus.   Eyes: Negative for blurred vision, double vision, photophobia and pain.  Respiratory: Negative for cough and shortness of breath.   Cardiovascular: Negative for chest pain and palpitations.  Gastrointestinal: Negative for abdominal pain, blood in stool, constipation, diarrhea, heartburn, melena, nausea and vomiting.  Genitourinary: Negative for dysuria, flank pain, frequency, hematuria and urgency.  Musculoskeletal: Negative for falls.  Neurological: Negative for dizziness, loss of consciousness and headaches.  Endo/Heme/Allergies: Negative for environmental allergies.  Psychiatric/Behavioral: Positive for depression. Negative for hallucinations, substance abuse and suicidal ideas. The patient is nervous/anxious. The patient does not have insomnia.     BP 116/72 (BP Location: Right Arm, Patient Position: Sitting, Cuff Size: Large)   Pulse 69   Temp 98.3 F (36.8 C)  (Oral)   Resp 16   Ht 5\' 6"  (1.676 m)   Wt 211 lb (95.7 kg)   SpO2 99%   BMI 34.06 kg/m   Physical Exam  Constitutional: She is oriented to person, place, and time and well-developed, well-nourished, and in no distress.  HENT:  Head: Normocephalic and atraumatic.  Right Ear: Tympanic membrane, external ear and ear canal normal.  Left Ear: Tympanic membrane, external ear and ear canal normal.  Nose: Nose normal. No mucosal edema.  Mouth/Throat: Uvula is midline, oropharynx is clear and moist and mucous membranes are normal. No oropharyngeal exudate or posterior oropharyngeal erythema.  Eyes: Conjunctivae are normal. Pupils are equal, round, and reactive to light.  Neck: Neck supple. No thyromegaly present.  Cardiovascular: Normal rate, regular rhythm, normal heart sounds and intact distal pulses.   Pulmonary/Chest: Effort normal and breath sounds normal. No respiratory distress. She has no wheezes. She  has no rales.  Abdominal: Soft. Bowel sounds are normal. She exhibits no distension and no mass. There is no tenderness. There is no rebound and no guarding.  Lymphadenopathy:    She has no cervical adenopathy.  Neurological: She is alert and oriented to person, place, and time. No cranial nerve deficit.  Skin: Skin is warm and dry. No rash noted.  Psychiatric: Affect normal.  Vitals reviewed.   Recent Results (from the past 2160 hour(s))  Implantable device - remote     Status: None   Collection Time: 04/17/16 11:46 AM  Result Value Ref Range   Date Time Interrogation Session [id number]    Pulse Generator Manufacturer [employer]    Pulse Gen Model [id number] Viva Quad XT CRT-D    Pulse Gen Serial Number [id number]    Implantable Pulse Generator Type Cardiac Resynch Therapy Defibulator    Implantable Pulse Generator Implant Date [id number]    Implantable Lead Manufacturer [facility]    Implantable Lead Model 4598    Implantable Lead Serial Number [id number]    Implantable Lead  Implant Date JK:9133365    Implantable Lead Location [id number]    Implantable Lead Manufacturer [facility]    Implantable Lead Model 5076 CapSureFix Novus    Implantable Lead Serial Number [id number]    Implantable Lead Implant Date JK:9133365    Implantable Lead Location [id number]    Implantable Lead Manufacturer [facility]    Implantable Lead Model [id number] [id number] Sprint Quattro Secure S    Implantable Lead Serial Number [id number]    Implantable Lead Implant Date JK:9133365    Implantable Lead Location [id number]    Lead Channel Setting Sensing Sensitivity 0.3 mV   Lead Channel Setting Pacing Amplitude 2 V   Lead Channel Setting Pacing Pulse Width 0.4 ms   Lead Channel Setting Pacing Amplitude 2.5 V   Lead Channel Setting Pacing Pulse Width 0.4 ms   Lead Channel Setting Pacing Amplitude 2.75 V   Lead Channel Setting Pacing Capture Mode Adaptive Capture    Lead Channel Impedance Value 475 ohm   Lead Channel Sensing Intrinsic Amplitude 1 mV   Lead Channel Sensing Intrinsic Amplitude 1 mV   Lead Channel Pacing Threshold Amplitude 0.625 V   Lead Channel Pacing Threshold Pulse Width 0.4 ms   Lead Channel Impedance Value 456 ohm   Lead Channel Impedance Value 361 ohm   Lead Channel Sensing Intrinsic Amplitude 27.375 mV   Lead Channel Sensing Intrinsic Amplitude 27.375 mV   Lead Channel Pacing Threshold Amplitude 0.75 V   Lead Channel Pacing Threshold Pulse Width 0.4 ms   HighPow Impedance 75 ohm   Lead Channel Impedance Value 722 ohm   Lead Channel Impedance Value 760 ohm   Lead Channel Impedance Value 760 ohm   Lead Channel Impedance Value 456 ohm   Lead Channel Impedance Value 589 ohm   Lead Channel Impedance Value 589 ohm   Lead Channel Impedance Value 475 ohm   Lead Channel Impedance Value 342 ohm   Lead Channel Impedance Value 342 ohm   Lead Channel Impedance Value 361 ohm   Lead Channel Pacing Threshold Amplitude 1.125 V   Lead Channel Pacing Threshold Pulse Width 0.4 ms   Battery Status OK    Battery  Remaining Longevity 79 mo   Battery Voltage 3.01 V   Brady Statistic RA Percent Paced 2.40 %   Brady Statistic RV Percent Paced 99.81 %   Brady Statistic AP VP Percent 2.37 %   Brady Statistic AS VP Percent 97.51 %  Brady Statistic AP VS Percent 0.03 %   Brady Statistic AS VS Percent 0.09 %   Eval Rhythm As/BiVP   CBC     Status: None   Collection Time: 06/16/16  8:27 AM  Result Value Ref Range   WBC 7.6 4.0 - 10.5 K/uL   RBC 4.13 3.87 - 5.11 Mil/uL   Platelets 260.0 150.0 - 400.0 K/uL   Hemoglobin 12.6 12.0 - 15.0 g/dL   HCT 37.2 36.0 - 46.0 %   MCV 90.0 78.0 - 100.0 fl   MCHC 34.0 30.0 - 36.0 g/dL   RDW 13.6 11.5 - 15.5 %  Comprehensive metabolic panel     Status: None   Collection Time: 06/16/16  8:27 AM  Result Value Ref Range   Sodium 139 135 - 145 mEq/L   Potassium 4.2 3.5 - 5.1 mEq/L   Chloride 104 96 - 112 mEq/L   CO2 29 19 - 32 mEq/L   Glucose, Bld 91 70 - 99 mg/dL   BUN 17 6 - 23 mg/dL   Creatinine, Ser 0.99 0.40 - 1.20 mg/dL   Total Bilirubin 0.5 0.2 - 1.2 mg/dL   Alkaline Phosphatase 62 39 - 117 U/L   AST 20 0 - 37 U/L   ALT 25 0 - 35 U/L   Total Protein 7.2 6.0 - 8.3 g/dL   Albumin 4.0 3.5 - 5.2 g/dL   Calcium 9.2 8.4 - 10.5 mg/dL   GFR 61.08 >60.00 mL/min  Hemoglobin A1c     Status: None   Collection Time: 06/16/16  8:27 AM  Result Value Ref Range   Hgb A1c MFr Bld 5.4 4.6 - 6.5 %    Comment: Glycemic Control Guidelines for People with Diabetes:Non Diabetic:  <6%Goal of Therapy: <7%Additional Action Suggested:  >8%   Lipid panel     Status: Abnormal   Collection Time: 06/16/16  8:27 AM  Result Value Ref Range   Cholesterol 236 (H) 0 - 200 mg/dL    Comment: ATP III Classification       Desirable:  < 200 mg/dL               Borderline High:  200 - 239 mg/dL          High:  > = 240 mg/dL   Triglycerides 116.0 0.0 - 149.0 mg/dL    Comment: Normal:  <150 mg/dLBorderline High:  150 - 199 mg/dL   HDL 56.90 >39.00 mg/dL   VLDL 23.2 0.0 - 40.0 mg/dL   LDL  Cholesterol 156 (H) 0 - 99 mg/dL   Total CHOL/HDL Ratio 4     Comment:                Men          Women1/2 Average Risk     3.4          3.3Average Risk          5.0          4.42X Average Risk          9.6          7.13X Average Risk          15.0          11.0                       NonHDL 179.55     Comment: NOTE:  Non-HDL goal should be 30 mg/dL higher than patient's LDL  goal (i.e. LDL goal of < 70 mg/dL, would have non-HDL goal of < 100 mg/dL)  TSH     Status: None   Collection Time: 06/16/16  8:27 AM  Result Value Ref Range   TSH 3.51 0.35 - 4.50 uIU/mL  Urinalysis, Routine w reflex microscopic (not at [facility])     Status: Abnormal   Collection Time: 06/16/16  8:27 AM  Result Value Ref Range   Color, Urine YELLOW Yellow;Lt. Yellow   APPearance CLEAR Clear   Specific Gravity, Urine 1.025 1.000 - 1.030   pH 5.0 5.0 - 8.0   Total Protein, Urine NEGATIVE Negative   Urine Glucose NEGATIVE Negative   Ketones, ur NEGATIVE Negative   Bilirubin Urine NEGATIVE Negative   Hgb urine dipstick TRACE-INTACT (A) Negative   Urobilinogen, UA 0.2 0.0 - 1.0   Leukocytes, UA MODERATE (A) Negative   Nitrite NEGATIVE Negative   WBC, UA 11-20/hpf (A) 0-2/hpf    Comment: Results faxed to site/floor on 06/16/2016 11:41 AM by Delorise Jackson.   RBC / HPF 0-2/hpf 0-2/hpf   Squamous Epithelial / LPF Rare(0-4/hpf) Rare(0-4/hpf)   Renal Epithel, UA Rare(0-4/hpf) (A) None  Urine culture     Status: None   Collection Time: 06/16/16  2:16 PM  Result Value Ref Range   Organism ID, Bacteria      Three or more organisms present,each greater than 10,000 CFU/mL.These organisms,commonly found on external and internal genitalia,are considered to be colonizers.No further testing performed.     Assessment/Plan: Visit for preventive health examination Depression screen negative. Health Maintenance reviewed -- Declines colonoscopy. Agrees to [employer]. Asymptomatic. Will place order. Preventive schedule discussed  and handout given in AVS. Will obtain fasting labs today.   Situational depression Has been on medications previously and is worried about weight gain. Discussed weight neutrality of Trintellix. Would like to give a try. Will begin at low dose and titrate to therapeutic response. FU scheduled. Alarm signs and symptoms reviewed that would prompt ER evaluation. Patient voices understanding and agreement.  ADD (attention deficit disorder) Doing well. No changes today.  NICM (nonischemic cardiomyopathy) (HCC) BP stable. Asymptomatic. Continue care as directed by Cardiology. FU as scheduled.    Leeanne Rio, PA-C

## 2016-06-16 NOTE — Patient Instructions (Signed)
Please go to the lab for blood work.   Our office will call you with your results unless you have chosen to receive results via MyChart.  If your blood work is normal we will follow-up each year for physicals and as scheduled for chronic medical problems.  If anything is abnormal we will treat accordingly and get you in for a follow-up.  For allergy symptoms -- Please try the Xyzal over-the-counter instead of the Allegra to see how this helps allergy symptoms. Continue Nasonex. Place a humidifier in the bedroom. Call if symptoms are not improving.  For depression/anxiety -- Use the voucher given and start the Trintellix. We will plan on following up in one month. If you note any worsening symptoms, call or come see me immediately.  Preventive Care for Adults, Female A healthy lifestyle and preventive care can promote health and wellness. Preventive health guidelines for women include the following key practices.  A routine yearly physical is a good way to check with your health care provider about your health and preventive screening. It is a chance to share any concerns and updates on your health and to receive a thorough exam.  Visit your dentist for a routine exam and preventive care every 6 months. Brush your teeth twice a day and floss once a day. Good oral hygiene prevents tooth decay and gum disease.  The frequency of eye exams is based on your age, health, family medical history, use of contact lenses, and other factors. Follow your health care provider's recommendations for frequency of eye exams.  Eat a healthy diet. Foods like vegetables, fruits, whole grains, low-fat dairy products, and lean protein foods contain the nutrients you need without too many calories. Decrease your intake of foods high in solid fats, added sugars, and salt. Eat the right amount of calories for you.Get information about a proper diet from your health care provider, if necessary.  Regular physical  exercise is one of the most important things you can do for your health. Most adults should get at least 150 minutes of moderate-intensity exercise (any activity that increases your heart rate and causes you to sweat) each week. In addition, most adults need muscle-strengthening exercises on 2 or more days a week.  Maintain a healthy weight. The body mass index (BMI) is a screening tool to identify possible weight problems. It provides an estimate of body fat based on height and weight. Your health care provider can find your BMI and can help you achieve or maintain a healthy weight.For adults 20 years and older:  A BMI below 18.5 is considered underweight.  A BMI of 18.5 to 24.9 is normal.  A BMI of 25 to 29.9 is considered overweight.  A BMI of 30 and above is considered obese.  Maintain normal blood lipids and cholesterol levels by exercising and minimizing your intake of saturated fat. Eat a balanced diet with plenty of fruit and vegetables. Blood tests for lipids and cholesterol should begin at age 86 and be repeated every 5 years. If your lipid or cholesterol levels are high, you are over 50, or you are at high risk for heart disease, you may need your cholesterol levels checked more frequently.Ongoing high lipid and cholesterol levels should be treated with medicines if diet and exercise are not working.  If you smoke, find out from your health care provider how to quit. If you do not use tobacco, do not start.  Lung cancer screening is recommended for adults aged 21-80 years  who are at high risk for developing lung cancer because of a history of smoking. A yearly low-dose CT scan of the lungs is recommended for people who have at least a 30-pack-year history of smoking and are a current smoker or have quit within the past 15 years. A pack year of smoking is smoking an average of 1 pack of cigarettes a day for 1 year (for example: 1 pack a day for 30 years or 2 packs a day for 15 years).  Yearly screening should continue until the smoker has stopped smoking for at least 15 years. Yearly screening should be stopped for people who develop a health problem that would prevent them from having lung cancer treatment.  If you are pregnant, do not drink alcohol. If you are breastfeeding, be very cautious about drinking alcohol. If you are not pregnant and choose to drink alcohol, do not have more than 1 drink per day. One drink is considered to be 12 ounces (355 mL) of beer, 5 ounces (148 mL) of wine, or 1.5 ounces (44 mL) of liquor.  Avoid use of street drugs. Do not share needles with anyone. Ask for help if you need support or instructions about stopping the use of drugs.  High blood pressure causes heart disease and increases the risk of stroke. Your blood pressure should be checked at least every 1 to 2 years. Ongoing high blood pressure should be treated with medicines if weight loss and exercise do not work.  If you are 19-69 years old, ask your health care provider if you should take aspirin to prevent strokes.  Diabetes screening is done by taking a blood sample to check your blood glucose level after you have not eaten for a certain period of time (fasting). If you are not overweight and you do not have risk factors for diabetes, you should be screened once every 3 years starting at age 31. If you are overweight or obese and you are 37-69 years of age, you should be screened for diabetes every year as part of your cardiovascular risk assessment.  Breast cancer screening is essential preventive care for women. You should practice "breast self-awareness." This means understanding the normal appearance and feel of your breasts and may include breast self-examination. Any changes detected, no matter how small, should be reported to a health care provider. Women in their 45s and 30s should have a clinical breast exam (CBE) by a health care provider as part of a regular health exam every 1 to  3 years. After age 51, women should have a CBE every year. Starting at age 33, women should consider having a mammogram (breast X-ray test) every year. Women who have a family history of breast cancer should talk to their health care provider about genetic screening. Women at a high risk of breast cancer should talk to their health care providers about having an MRI and a mammogram every year.  Breast cancer gene (BRCA)-related cancer risk assessment is recommended for women who have family members with BRCA-related cancers. BRCA-related cancers include breast, ovarian, tubal, and peritoneal cancers. Having family members with these cancers may be associated with an increased risk for harmful changes (mutations) in the breast cancer genes BRCA1 and BRCA2. Results of the assessment will determine the need for genetic counseling and BRCA1 and BRCA2 testing.  Your health care provider may recommend that you be screened regularly for cancer of the pelvic organs (ovaries, uterus, and vagina). This screening involves a pelvic examination, including  checking for microscopic changes to the surface of your cervix (Pap test). You may be encouraged to have this screening done every 3 years, beginning at age 22.  For women ages 33-65, health care providers may recommend pelvic exams and Pap testing every 3 years, or they may recommend the Pap and pelvic exam, combined with testing for human papilloma virus (HPV), every 5 years. Some types of HPV increase your risk of cervical cancer. Testing for HPV may also be done on women of any age with unclear Pap test results.  Other health care providers may not recommend any screening for nonpregnant women who are considered low risk for pelvic cancer and who do not have symptoms. Ask your health care provider if a screening pelvic exam is right for you.  If you have had past treatment for cervical cancer or a condition that could lead to cancer, you need Pap tests and  screening for cancer for at least 20 years after your treatment. If Pap tests have been discontinued, your risk factors (such as having a new sexual partner) need to be reassessed to determine if screening should resume. Some women have medical problems that increase the chance of getting cervical cancer. In these cases, your health care provider may recommend more frequent screening and Pap tests.  Colorectal cancer can be detected and often prevented. Most routine colorectal cancer screening begins at the age of 2 years and continues through age 63 years. However, your health care provider may recommend screening at an earlier age if you have risk factors for colon cancer. On a yearly basis, your health care provider may provide home test kits to check for hidden blood in the stool. Use of a small camera at the end of a tube, to directly examine the colon (sigmoidoscopy or colonoscopy), can detect the earliest forms of colorectal cancer. Talk to your health care provider about this at age 82, when routine screening begins. Direct exam of the colon should be repeated every 5-10 years through age 64 years, unless early forms of precancerous polyps or small growths are found.  People who are at an increased risk for hepatitis B should be screened for this virus. You are considered at high risk for hepatitis B if:  You were born in a country where hepatitis B occurs often. Talk with your health care provider about which countries are considered high risk.  Your parents were born in a high-risk country and you have not received a shot to protect against hepatitis B (hepatitis B vaccine).  You have HIV or AIDS.  You use needles to inject street drugs.  You live with, or have sex with, someone who has hepatitis B.  You get hemodialysis treatment.  You take certain medicines for conditions like cancer, organ transplantation, and autoimmune conditions.  Hepatitis C blood testing is recommended for all  people born from [birth year] through 1964 and any individual with known risks for hepatitis C.  Practice safe sex. Use condoms and avoid high-risk sexual practices to reduce the spread of sexually transmitted infections (STIs). STIs include gonorrhea, chlamydia, syphilis, trichomonas, herpes, HPV, and human immunodeficiency virus (HIV). Herpes, HIV, and HPV are viral illnesses that have no cure. They can result in disability, cancer, and death.  You should be screened for sexually transmitted illnesses (STIs) including gonorrhea and chlamydia if:  You are sexually active and are younger than 24 years.  You are older than 24 years and your health care provider tells you that you  are at risk for this type of infection.  Your sexual activity has changed since you were last screened and you are at an increased risk for chlamydia or gonorrhea. Ask your health care provider if you are at risk.  If you are at risk of being infected with HIV, it is recommended that you take a prescription medicine daily to prevent HIV infection. This is called preexposure prophylaxis (PrEP). You are considered at risk if:  You are sexually active and do not regularly use condoms or know the HIV status of your partner(s).  You take drugs by injection.  You are sexually active with a partner who has HIV.  Talk with your health care provider about whether you are at high risk of being infected with HIV. If you choose to begin PrEP, you should first be tested for HIV. You should then be tested every 3 months for as long as you are taking PrEP.  Osteoporosis is a disease in which the bones lose minerals and strength with aging. This can result in serious bone fractures or breaks. The risk of osteoporosis can be identified using a bone density scan. Women ages 17 years and over and women at risk for fractures or osteoporosis should discuss screening with their health care providers. Ask your health care provider whether you should  take a calcium supplement or vitamin D to reduce the rate of osteoporosis.  Menopause can be associated with physical symptoms and risks. Hormone replacement therapy is available to decrease symptoms and risks. You should talk to your health care provider about whether hormone replacement therapy is right for you.  Use sunscreen. Apply sunscreen liberally and repeatedly throughout the day. You should seek shade when your shadow is shorter than you. Protect yourself by wearing long sleeves, pants, a wide-brimmed hat, and sunglasses year round, whenever you are outdoors.  Once a month, do a whole body skin exam, using a mirror to look at the skin on your back. Tell your health care provider of new moles, moles that have irregular borders, moles that are larger than a pencil eraser, or moles that have changed in shape or color.  Stay current with required vaccines (immunizations).  Influenza vaccine. All adults should be immunized every year.  Tetanus, diphtheria, and acellular pertussis (Td, Tdap) vaccine. Pregnant women should receive 1 dose of Tdap vaccine during each pregnancy. The dose should be obtained regardless of the length of time since the last dose. Immunization is preferred during the 27th-36th week of gestation. An adult who has not previously received Tdap or who does not know her vaccine status should receive 1 dose of Tdap. This initial dose should be followed by tetanus and diphtheria toxoids (Td) booster doses every 10 years. Adults with an unknown or incomplete history of completing a 3-dose immunization series with Td-containing vaccines should begin or complete a primary immunization series including a Tdap dose. Adults should receive a Td booster every 10 years.  Varicella vaccine. An adult without evidence of immunity to varicella should receive 2 doses or a second dose if she has previously received 1 dose. Pregnant females who do not have evidence of immunity should receive the  first dose after pregnancy. This first dose should be obtained before leaving the health care facility. The second dose should be obtained 4-8 weeks after the first dose.  Human papillomavirus (HPV) vaccine. Females aged 13-26 years who have not received the vaccine previously should obtain the 3-dose series. The vaccine is not recommended  for use in pregnant females. However, pregnancy testing is not needed before receiving a dose. If a female is found to be pregnant after receiving a dose, no treatment is needed. In that case, the remaining doses should be delayed until after the pregnancy. Immunization is recommended for any person with an immunocompromised condition through the age of 21 years if she did not get any or all doses earlier. During the 3-dose series, the second dose should be obtained 4-8 weeks after the first dose. The third dose should be obtained 24 weeks after the first dose and 16 weeks after the second dose.  Zoster vaccine. One dose is recommended for adults aged 47 years or older unless certain conditions are present.  Measles, mumps, and rubella (MMR) vaccine. Adults born before [birth year] generally are considered immune to measles and mumps. Adults born in [birth year] or later should have 1 or more doses of MMR vaccine unless there is a contraindication to the vaccine or there is laboratory evidence of immunity to each of the three diseases. A routine second dose of MMR vaccine should be obtained at least 28 days after the first dose for students attending postsecondary schools, health care workers, or international travelers. People who received inactivated measles vaccine or an unknown type of measles vaccine during 1963-1967 should receive 2 doses of MMR vaccine. People who received inactivated mumps vaccine or an unknown type of mumps vaccine before 1979 and are at high risk for mumps infection should consider immunization with 2 doses of MMR vaccine. For females of childbearing age, rubella  immunity should be determined. If there is no evidence of immunity, females who are not pregnant should be vaccinated. If there is no evidence of immunity, females who are pregnant should delay immunization until after pregnancy. Unvaccinated health care workers born before [birth year] who lack laboratory evidence of measles, mumps, or rubella immunity or laboratory confirmation of disease should consider measles and mumps immunization with 2 doses of MMR vaccine or rubella immunization with 1 dose of MMR vaccine.  Pneumococcal 13-valent conjugate (PCV13) vaccine. When indicated, a person who is uncertain of his immunization history and has no record of immunization should receive the PCV13 vaccine. All adults 76 years of age and older should receive this vaccine. An adult aged 59 years or older who has certain medical conditions and has not been previously immunized should receive 1 dose of PCV13 vaccine. This PCV13 should be followed with a dose of pneumococcal polysaccharide (PPSV23) vaccine. Adults who are at high risk for pneumococcal disease should obtain the PPSV23 vaccine at least 8 weeks after the dose of PCV13 vaccine. Adults older than 59 years of age who have normal immune system function should obtain the PPSV23 vaccine dose at least 1 year after the dose of PCV13 vaccine.  Pneumococcal polysaccharide (PPSV23) vaccine. When PCV13 is also indicated, PCV13 should be obtained first. All adults aged 81 years and older should be immunized. An adult younger than age 26 years who has certain medical conditions should be immunized. Any person who resides in a nursing home or long-term care facility should be immunized. An adult smoker should be immunized. People with an immunocompromised condition and certain other conditions should receive both PCV13 and PPSV23 vaccines. People with human immunodeficiency virus (HIV) infection should be immunized as soon as possible after diagnosis. Immunization during  chemotherapy or radiation therapy should be avoided. Routine use of PPSV23 vaccine is not recommended for American Indians, [place] Natives, or people younger than 6  years unless there are medical conditions that require PPSV23 vaccine. When indicated, people who have unknown immunization and have no record of immunization should receive PPSV23 vaccine. One-time revaccination 5 years after the first dose of PPSV23 is recommended for people aged 19-64 years who have chronic kidney failure, nephrotic syndrome, asplenia, or immunocompromised conditions. People who received 1-2 doses of PPSV23 before age 29 years should receive another dose of PPSV23 vaccine at age 36 years or later if at least 5 years have passed since the previous dose. Doses of PPSV23 are not needed for people immunized with PPSV23 at or after age 18 years.  Meningococcal vaccine. Adults with asplenia or persistent complement component deficiencies should receive 2 doses of quadrivalent meningococcal conjugate (MenACWY-D) vaccine. The doses should be obtained at least 2 months apart. Microbiologists working with certain meningococcal bacteria, [place] recruits, people at risk during an outbreak, and people who travel to or live in countries with a high rate of meningitis should be immunized. A first-year college student up through age 44 years who is living in a residence hall should receive a dose if she did not receive a dose on or after her 16th birthday. Adults who have certain high-risk conditions should receive one or more doses of vaccine.  Hepatitis A vaccine. Adults who wish to be protected from this disease, have certain high-risk conditions, work with hepatitis A-infected animals, work in hepatitis A research labs, or travel to or work in countries with a high rate of hepatitis A should be immunized. Adults who were previously unvaccinated and who anticipate close contact with an international adoptee during the first 60 days after  arrival in the [place] from a country with a high rate of hepatitis A should be immunized.  Hepatitis B vaccine. Adults who wish to be protected from this disease, have certain high-risk conditions, may be exposed to blood or other infectious body fluids, are household contacts or sex partners of hepatitis B positive people, are clients or workers in certain care facilities, or travel to or work in countries with a high rate of hepatitis B should be immunized.  Haemophilus influenzae type b (Hib) vaccine. A previously unvaccinated person with asplenia or sickle cell disease or having a scheduled splenectomy should receive 1 dose of Hib vaccine. Regardless of previous immunization, a recipient of a hematopoietic stem cell transplant should receive a 3-dose series 6-12 months after her successful transplant. Hib vaccine is not recommended for adults with HIV infection. Preventive Services / Frequency Ages 100 to 48 years  Blood pressure check.** / Every 3-5 years.  Lipid and cholesterol check.** / Every 5 years beginning at age 70.  Clinical breast exam.** / Every 3 years for women in their 62s and 36s.  BRCA-related cancer risk assessment.** / For women who have family members with a BRCA-related cancer (breast, ovarian, tubal, or peritoneal cancers).  Pap test.** / Every 2 years from ages 30 through 46. Every 3 years starting at age 66 through age 43 or 21 with a history of 3 consecutive normal Pap tests.  HPV screening.** / Every 3 years from ages 61 through ages 1 to 105 with a history of 3 consecutive normal Pap tests.  Hepatitis C blood test.** / For any individual with known risks for hepatitis C.  Skin self-exam. / Monthly.  Influenza vaccine. / Every year.  Tetanus, diphtheria, and acellular pertussis (Tdap, Td) vaccine.** / Consult your health care provider. Pregnant women should receive 1 dose of Tdap vaccine  during each pregnancy. 1 dose of Td every 10 years.  Varicella  vaccine.** / Consult your health care provider. Pregnant females who do not have evidence of immunity should receive the first dose after pregnancy.  HPV vaccine. / 3 doses over 6 months, if 24 and younger. The vaccine is not recommended for use in pregnant females. However, pregnancy testing is not needed before receiving a dose.  Measles, mumps, rubella (MMR) vaccine.** / You need at least 1 dose of MMR if you were born in 1957 or later. You may also need a 2nd dose. For females of childbearing age, rubella immunity should be determined. If there is no evidence of immunity, females who are not pregnant should be vaccinated. If there is no evidence of immunity, females who are pregnant should delay immunization until after pregnancy.  Pneumococcal 13-valent conjugate (PCV13) vaccine.** / Consult your health care provider.  Pneumococcal polysaccharide (PPSV23) vaccine.** / 1 to 2 doses if you smoke cigarettes or if you have certain conditions.  Meningococcal vaccine.** / 1 dose if you are age 47 to 68 years and a Market researcher living in a residence hall, or have one of several medical conditions, you need to get vaccinated against meningococcal disease. You may also need additional booster doses.  Hepatitis A vaccine.** / Consult your health care provider.  Hepatitis B vaccine.** / Consult your health care provider.  Haemophilus influenzae type b (Hib) vaccine.** / Consult your health care provider. Ages 1 to 58 years  Blood pressure check.** / Every year.  Lipid and cholesterol check.** / Every 5 years beginning at age 72 years.  Lung cancer screening. / Every year if you are aged 25-80 years and have a 30-pack-year history of smoking and currently smoke or have quit within the past 15 years. Yearly screening is stopped once you have quit smoking for at least 15 years or develop a health problem that would prevent you from having lung cancer treatment.  Clinical breast exam.**  / Every year after age 79 years.  BRCA-related cancer risk assessment.** / For women who have family members with a BRCA-related cancer (breast, ovarian, tubal, or peritoneal cancers).  Mammogram.** / Every year beginning at age 63 years and continuing for as long as you are in good health. Consult with your health care provider.  Pap test.** / Every 3 years starting at age 34 years through age 9 or 46 years with a history of 3 consecutive normal Pap tests.  HPV screening.** / Every 3 years from ages 6 years through ages 24 to 34 years with a history of 3 consecutive normal Pap tests.  Fecal occult blood test (FOBT) of stool. / Every year beginning at age 52 years and continuing until age 32 years. You may not need to do this test if you get a colonoscopy every 10 years.  Flexible sigmoidoscopy or colonoscopy.** / Every 5 years for a flexible sigmoidoscopy or every 10 years for a colonoscopy beginning at age 22 years and continuing until age 56 years.  Hepatitis C blood test.** / For all people born from [birth year] through 1964 and any individual with known risks for hepatitis C.  Skin self-exam. / Monthly.  Influenza vaccine. / Every year.  Tetanus, diphtheria, and acellular pertussis (Tdap/Td) vaccine.** / Consult your health care provider. Pregnant women should receive 1 dose of Tdap vaccine during each pregnancy. 1 dose of Td every 10 years.  Varicella vaccine.** / Consult your health care provider. Pregnant females who do  not have evidence of immunity should receive the first dose after pregnancy.  Zoster vaccine.** / 1 dose for adults aged 75 years or older.  Measles, mumps, rubella (MMR) vaccine.** / You need at least 1 dose of MMR if you were born in 1957 or later. You may also need a second dose. For females of childbearing age, rubella immunity should be determined. If there is no evidence of immunity, females who are not pregnant should be vaccinated. If there is no evidence of  immunity, females who are pregnant should delay immunization until after pregnancy.  Pneumococcal 13-valent conjugate (PCV13) vaccine.** / Consult your health care provider.  Pneumococcal polysaccharide (PPSV23) vaccine.** / 1 to 2 doses if you smoke cigarettes or if you have certain conditions.  Meningococcal vaccine.** / Consult your health care provider.  Hepatitis A vaccine.** / Consult your health care provider.  Hepatitis B vaccine.** / Consult your health care provider.  Haemophilus influenzae type b (Hib) vaccine.** / Consult your health care provider. Ages 40 years and over  Blood pressure check.** / Every year.  Lipid and cholesterol check.** / Every 5 years beginning at age 63 years.  Lung cancer screening. / Every year if you are aged 20-80 years and have a 30-pack-year history of smoking and currently smoke or have quit within the past 15 years. Yearly screening is stopped once you have quit smoking for at least 15 years or develop a health problem that would prevent you from having lung cancer treatment.  Clinical breast exam.** / Every year after age 83 years.  BRCA-related cancer risk assessment.** / For women who have family members with a BRCA-related cancer (breast, ovarian, tubal, or peritoneal cancers).  Mammogram.** / Every year beginning at age 40 years and continuing for as long as you are in good health. Consult with your health care provider.  Pap test.** / Every 3 years starting at age 33 years through age 63 or 73 years with 3 consecutive normal Pap tests. Testing can be stopped between 65 and 70 years with 3 consecutive normal Pap tests and no abnormal Pap or HPV tests in the past 10 years.  HPV screening.** / Every 3 years from ages 55 years through ages 68 or 77 years with a history of 3 consecutive normal Pap tests. Testing can be stopped between 65 and 70 years with 3 consecutive normal Pap tests and no abnormal Pap or HPV tests in the past 10  years.  Fecal occult blood test (FOBT) of stool. / Every year beginning at age 72 years and continuing until age 74 years. You may not need to do this test if you get a colonoscopy every 10 years.  Flexible sigmoidoscopy or colonoscopy.** / Every 5 years for a flexible sigmoidoscopy or every 10 years for a colonoscopy beginning at age 74 years and continuing until age 3 years.  Hepatitis C blood test.** / For all people born from [birth year] through 1964 and any individual with known risks for hepatitis C.  Osteoporosis screening.** / A one-time screening for women ages 55 years and over and women at risk for fractures or osteoporosis.  Skin self-exam. / Monthly.  Influenza vaccine. / Every year.  Tetanus, diphtheria, and acellular pertussis (Tdap/Td) vaccine.** / 1 dose of Td every 10 years.  Varicella vaccine.** / Consult your health care provider.  Zoster vaccine.** / 1 dose for adults aged 33 years or older.  Pneumococcal 13-valent conjugate (PCV13) vaccine.** / Consult your health care provider.  Pneumococcal polysaccharide (  PPSV23) vaccine.** / 1 dose for all adults aged 2 years and older.  Meningococcal vaccine.** / Consult your health care provider.  Hepatitis A vaccine.** / Consult your health care provider.  Hepatitis B vaccine.** / Consult your health care provider.  Haemophilus influenzae type b (Hib) vaccine.** / Consult your health care provider. ** Family history and personal history of risk and conditions may change your health care provider's recommendations.   This information is not intended to replace advice given to you by your health care provider. Make sure you discuss any questions you have with your health care provider.   Document Released: 11/14/2001 Document Revised: 10/09/2014 Document Reviewed: 02/13/2011 Elsevier Interactive Patient Education [employer].

## 2016-06-18 DIAGNOSIS — Z0000 Encounter for general adult medical examination without abnormal findings: Secondary | ICD-10-CM | POA: Insufficient documentation

## 2016-06-18 LAB — URINE CULTURE

## 2016-06-18 NOTE — Assessment & Plan Note (Signed)
BP stable. Asymptomatic. Continue care as directed by Cardiology. FU as scheduled.

## 2016-06-18 NOTE — Assessment & Plan Note (Signed)
Has been on medications previously and is worried about weight gain. Discussed weight neutrality of Trintellix. Would like to give a try. Will begin at low dose and titrate to therapeutic response. FU scheduled. Alarm signs and symptoms reviewed that would prompt ER evaluation. Patient voices understanding and agreement.

## 2016-06-18 NOTE — Assessment & Plan Note (Signed)
Doing well. No changes today.

## 2016-06-18 NOTE — Assessment & Plan Note (Signed)
Depression screen negative. Health Maintenance reviewed -- Declines colonoscopy. Agrees to [employer]. Asymptomatic. Will place order. Preventive schedule discussed and handout given in AVS. Will obtain fasting labs today.

## 2016-06-20 ENCOUNTER — Other Ambulatory Visit: Payer: Self-pay | Admitting: Physician Assistant

## 2016-06-20 DIAGNOSIS — M545 Low back pain: Secondary | ICD-10-CM

## 2016-06-20 DIAGNOSIS — R82998 Other abnormal findings in urine: Secondary | ICD-10-CM

## 2016-06-27 ENCOUNTER — Other Ambulatory Visit (INDEPENDENT_AMBULATORY_CARE_PROVIDER_SITE_OTHER): Payer: BLUE CROSS/BLUE SHIELD

## 2016-06-27 DIAGNOSIS — R82998 Other abnormal findings in urine: Secondary | ICD-10-CM

## 2016-06-27 DIAGNOSIS — M545 Low back pain: Secondary | ICD-10-CM

## 2016-06-27 DIAGNOSIS — N390 Urinary tract infection, site not specified: Secondary | ICD-10-CM | POA: Diagnosis not present

## 2016-06-27 LAB — URINALYSIS, ROUTINE W REFLEX MICROSCOPIC
BILIRUBIN URINE: NEGATIVE
Hgb urine dipstick: NEGATIVE
KETONES UR: NEGATIVE
NITRITE: NEGATIVE
Specific Gravity, Urine: >=1.03 — AB (ref 1.000–1.030)
Total Protein, Urine: NEGATIVE
Urine Glucose: NEGATIVE
Urobilinogen, UA: 0.2 (ref 0.0–1.0)
pH: 5 (ref 5.0–8.0)

## 2016-06-28 ENCOUNTER — Telehealth: Payer: Self-pay | Admitting: Physician Assistant

## 2016-06-28 NOTE — Telephone Encounter (Signed)
Pt called in to inquire about her results. Please call pt back at :    CB: [phone]

## 2016-06-29 ENCOUNTER — Other Ambulatory Visit: Payer: Self-pay | Admitting: Physician Assistant

## 2016-06-29 ENCOUNTER — Encounter: Payer: Self-pay | Admitting: Physician Assistant

## 2016-06-29 ENCOUNTER — Telehealth: Payer: Self-pay | Admitting: Physician Assistant

## 2016-06-29 LAB — CULTURE, URINE COMPREHENSIVE: ORGANISM ID, BACTERIA: NO GROWTH

## 2016-06-29 MED ORDER — LISDEXAMFETAMINE DIMESYLATE 40 MG PO CAPS
40.0000 mg | ORAL_CAPSULE | ORAL | 0 refills | Status: DC
Start: 2016-06-29 — End: 2016-08-07

## 2016-06-29 MED ORDER — CEPHALEXIN 500 MG PO CAPS: 500.0000 mg | ORAL_CAPSULE | Freq: Two times a day (BID) | ORAL | 0 refills | Status: DC

## 2016-06-29 MED FILL — CEPHALEXIN 500 MG CAPSULE: 500 | 7 days supply | Qty: 14 | Fill #0

## 2016-06-29 NOTE — Telephone Encounter (Signed)
Called patient to address concern regarding phone call into our office. Patient will call back later today. She was in another appointment said she would call me back so she could go into detail.

## 2016-06-29 NOTE — Telephone Encounter (Signed)
Dr. Charlett Blake did approve refill for Vyvanse. Patient is aware and will pickup at the front desk today.

## 2016-06-29 NOTE — Telephone Encounter (Signed)
Patient called to follow up on results from 9/15. Plse adv.  Patient also states she did not receive a Rx for Vyvanse when she was here to see [place]. States she is going out of town and needs this ASAP. Requested that note be sent to Dr. Charlett Blake. Patient has never seen Charlett Blake but thinks that Charlett Blake is her PCP. Please call patient @ [phone].

## 2016-06-29 NOTE — Telephone Encounter (Signed)
Requesting: Vyvanse Contract  05/21/2015 UDS  None Last OV  06/16/2016 Last Refill   #30 with 0 refills on 03/13/2016  Please Advise

## 2016-06-29 NOTE — Telephone Encounter (Signed)
done

## 2016-06-29 NOTE — Telephone Encounter (Signed)
-----   Message from Brunetta Jeans, PA-C sent at 06/29/2016  1:18 PM EDT ----- Marykay Lex all -- would you reach out to Dr. Charlett Blake and see if she would be willing to fill patient's Vyvanse in my absence. I have responded to patient via MyChart regarding her lab results and waiting on a reply. Please reach out to patient and let her know Dr. Frederik Pear decision.  FYI Dr. Charlett Blake

## 2016-06-30 ENCOUNTER — Telehealth: Payer: Self-pay | Admitting: *Deleted

## 2016-06-30 NOTE — Telephone Encounter (Signed)
Received PA request from [place]; Initiated via CMM, received Approval & verified with pharmacy that Rx did go through. Patient informed via message on VM/SLS 09/27

## 2016-07-25 ENCOUNTER — Ambulatory Visit: Payer: BLUE CROSS/BLUE SHIELD | Admitting: Physician Assistant

## 2016-07-27 ENCOUNTER — Encounter: Payer: Self-pay | Admitting: Nurse Practitioner

## 2016-08-01 ENCOUNTER — Other Ambulatory Visit (HOSPITAL_COMMUNITY): Payer: Self-pay | Admitting: Adult Health

## 2016-08-07 ENCOUNTER — Other Ambulatory Visit: Payer: Self-pay | Admitting: Family Medicine

## 2016-08-08 ENCOUNTER — Encounter: Payer: Self-pay | Admitting: Physician Assistant

## 2016-08-08 MED ORDER — LISDEXAMFETAMINE DIMESYLATE 40 MG PO CAPS: 40.0000 mg | ORAL_CAPSULE | ORAL | 0 refills | Status: DC

## 2016-08-10 ENCOUNTER — Encounter: Payer: BLUE CROSS/BLUE SHIELD | Admitting: Nurse Practitioner

## 2016-08-11 ENCOUNTER — Telehealth: Payer: Self-pay | Admitting: Physician Assistant

## 2016-08-11 NOTE — Telephone Encounter (Signed)
Patient called to see if she could send her brother to pick up her Rx for Vyvanse. Informed patient that she needed to pick up Rx herself. (Rx states she needs a UDS before picking up and also needs to sign Controlled Substance Contract). Patient was not happy when told she needed to pick up Rx and stated she would contact [facility] via My Chart. Patient was not informed that she needed a UDS, just that she needed to pick it up personally.

## 2016-08-11 NOTE — Telephone Encounter (Signed)
Unfortunately each patient who is on a controlled substance contract has to follow these rules. I will discuss with her the reason she needs to pick up.

## 2016-08-14 ENCOUNTER — Telehealth (HOSPITAL_COMMUNITY): Payer: Self-pay | Admitting: Cardiology

## 2016-08-14 MED ORDER — CARVEDILOL 12.5 MG PO TABS: 12.5000 mg | ORAL_TABLET | Freq: Two times a day (BID) | ORAL | 3 refills | Status: DC

## 2016-09-05 NOTE — Progress Notes (Signed)
Electrophysiology Office Note Date: 09/07/2016  ID:  Debra Holloway, DOB [birth year], MRN [id number]  PCP: Leeanne Rio, PA-C Primary Cardiologist: [facility] Electrophysiologist: Caryl Comes  CC: Routine ICD follow-up  Debra Holloway is a 60 y.o. female seen today for Dr Caryl Comes.  She presents today for routine electrophysiology followup.  Since last being seen in our clinic, the patient reports doing reasonably well.  For the last couple of days, she has been experiencing non-productive cough as well as some orthopnea, but she is unable to tell if this is related to fluid or allergies.  Weights are stable.  She denies chest pain, palpitations, dyspnea, PND, nausea, vomiting, dizziness, syncope, edema, weight gain, or early satiety.  She has not had ICD shocks.   Device History: MDT CRTD implanted 2016 for NICM, CHF History of appropriate therapy: No History of AAD therapy: No   Past Medical History:  Diagnosis Date  . ADD (attention deficit disorder)   . AICD (automatic cardioverter/defibrillator) present 08-24-2015  . Anxiety    "just when my daddy died a few years ago" (08-24-15)  . AR (allergic rhinitis)   . CHF (congestive heart failure) ([place]) dx'd 11/2014  . Chicken pox   . Depression    "just when my daddy died a few years ago" (2015-08-24)  . Dyslipidemia   . Environmental allergies   . H/O vitamin D deficiency   . LBBB (left bundle branch block)    W/1st degree AV Block, Avoid Beta Blockers  . OSA (obstructive sleep apnea) 03/18/2015   Severe OSA with AHI 30/hr; "suppose to wear mask but don't" (08/24/15)  . Seasonal affective disorder [facility])    Past Surgical History:  Procedure Laterality Date  . CARDIAC CATHETERIZATION    . DILATION AND CURETTAGE OF UTERUS  2003  . EP IMPLANTABLE DEVICE N/A August 24, 2015   Procedure: BiV ICD Insertion CRT-D;  Surgeon: Deboraha Sprang, MD;  Location: [facility];  Service: Cardiovascular;  Laterality: N/A;  . LEFT AND RIGHT  HEART CATHETERIZATION WITH CORONARY ANGIOGRAM N/A 11/17/2014   Procedure: LEFT AND RIGHT HEART CATHETERIZATION WITH CORONARY ANGIOGRAM;  Surgeon: Troy Sine, MD;  Location: [facility];  Service: Cardiovascular;  Laterality: N/A;  . TOTAL ABDOMINAL HYSTERECTOMY  2004  . TUBAL LIGATION    . WISDOM TOOTH EXTRACTION  1986    Current Outpatient Prescriptions  Medication Sig Dispense Refill  . acetaminophen (TYLENOL) 500 MG tablet Take 500 mg by mouth every 6 (six) hours as needed for moderate pain.    Marland Kitchen b complex vitamins tablet Take 1 tablet by mouth daily.    . carvedilol (COREG) 12.5 MG tablet Take 1 tablet (12.5 mg total) by mouth 2 (two) times daily with a meal. 60 tablet 3  . fexofenadine (ALLEGRA) 180 MG tablet Take 1 tablet (180 mg total) by mouth daily.    . furosemide (LASIX) 20 MG tablet Take 20 mg by mouth daily as needed for fluid or edema.    Marland Kitchen lisdexamfetamine (VYVANSE) 40 MG capsule Take 1 capsule (40 mg total) by mouth every morning. 30 capsule 0  . lisinopril (PRINIVIL,ZESTRIL) 10 MG tablet TAKE 1 TABLET(10 MG) BY MOUTH AT BEDTIME 30 tablet 0  . mometasone (NASONEX) 50 MCG/ACT nasal spray Place 2 sprays into the nose daily. 17 g 12  . potassium chloride (K-DUR,KLOR-CON) 10 MEQ tablet Take 2 tablets (20 mEq total) by mouth as needed (when take lasix). 30 tablet 3  . spironolactone (ALDACTONE) 25 MG tablet  TAKE 1 TABLET(25 MG) BY MOUTH DAILY 30 tablet 6   No current facility-administered medications for this visit.     Allergies:   Bee venom; Erythromycin; Lovastatin; Penicillins; and Simvastatin   Social History: Social History   Social History  . Marital status: Divorced    Spouse name: N/A  . Number of children: N/A  . Years of education: N/A   Occupational History  . Not on file.   Social History Main Topics  . Smoking status: Never Smoker  . Smokeless tobacco: Never Used  . Alcohol use 2.4 oz/week    4 Glasses of wine per week  . Drug use: No  . Sexual  activity: Not Currently   Other Topics Concern  . Not on file   Social History Narrative  . No narrative on file    Family History: Family History  Problem Relation Age of Onset  . COPD Mother     Living  . Pancreatic cancer Father 57    Deceased  . Lung cancer Paternal Grandfather   . Heart disease Paternal Grandmother   . Hypertension Paternal Grandmother   . Alzheimer's disease Maternal Grandfather   . Arthritis/Rheumatoid Maternal Grandmother   . Heart disease Maternal Grandmother     Tachycardia  . Cancer Paternal Uncle   . Healthy Brother     x3  . Healthy Daughter     x3  . Breast cancer Sister 7    Review of Systems: All other systems reviewed and are otherwise negative except as noted above.   Physical Exam: VS:  BP 106/78   Pulse 78   Ht 5\' 6"  (1.676 m)   Wt 210 lb (95.3 kg)   BMI 33.89 kg/m  , BMI Body mass index is 33.89 kg/m.  GEN- The patient is obese appearing, alert and oriented x 3 today.   HEENT: normocephalic, atraumatic; sclera clear, conjunctiva pink; hearing intact; oropharynx clear; neck supple  Lungs- Clear to ausculation bilaterally, normal work of breathing.  No wheezes, rales, rhonchi Heart- Regular rate and rhythm (paced) GI- soft, non-tender, non-distended, bowel sounds present  Extremities- no clubbing, cyanosis, or edema; DP/PT/radial pulses 2+ bilaterally MS- no significant deformity or atrophy Skin- warm and dry, no rash or lesion; ICD pocket well healed Psych- euthymic mood, full affect Neuro- strength and sensation are intact  ICD interrogation- reviewed in detail today,  See PACEART report  EKG:  EKG is not ordered today.  Recent Labs: 11/16/2015: B Natriuretic Peptide 63.5 06/16/2016: ALT 25; BUN 17; Creatinine, Ser 0.99; Hemoglobin 12.6; Platelets 260.0; Potassium 4.2; Sodium 139; TSH 3.51   Wt Readings from Last 3 Encounters:  09/07/16 210 lb (95.3 kg)  06/16/16 211 lb (95.7 kg)  04/05/16 210 lb (95.3 kg)      Other studies Reviewed: Additional studies/ records that were reviewed today include: Dr Caryl Comes and Dr Bensimhon's office notes  Assessment and Plan:  1.  Chronic systolic dysfunction EF improved to 45-50% post CRT euvolemic today She does have some subjective heart failure symptoms - I have advised to try taking prn Lasix today to see if symptoms improve Stable on an appropriate medical regimen Normal ICD function. Pt with intermittent T wave oversensing identified as PVC's by device.  V sensitivity changed today from 0.48mV to 0.23mV with resolution.  See Claudia Desanctis Art report Enroll [facility]    Current medicines are reviewed at length with the patient today.   The patient does not have concerns regarding her medicines.  The following changes were made today:  none  Labs/ tests ordered today include: none No orders of the defined types were placed in this encounter.    Disposition:   Follow up with [facility], Dr Haroldine Laws as scheduled, Dr Caryl Comes 1 year     Signed, Chanetta Marshall, NP 09/07/2016 8:42 AM  [address] [place] [address] [phone] (office) [phone] (fax

## 2016-09-06 ENCOUNTER — Telehealth: Payer: Self-pay | Admitting: Physician Assistant

## 2016-09-06 MED ORDER — LISDEXAMFETAMINE DIMESYLATE 40 MG PO CAPS: 40.0000 mg | ORAL_CAPSULE | ORAL | 0 refills | Status: DC

## 2016-09-06 NOTE — Telephone Encounter (Signed)
Called pt to reschedule appt for 12/7, pt could not come in at other times offered on 12/7 due to having another appt, pt did reschedule for 12/21(next avail time for pt to come), however pt states that she will need a refill on her ADD meds, pt states that she will run out before this appt.

## 2016-09-06 NOTE — Telephone Encounter (Signed)
I have printed out 22-month Rx until she is seen for follow-up since I had to cancel at the last minute. I appreciate her being considerate of me having to be out of office for a family member.

## 2016-09-06 NOTE — Telephone Encounter (Signed)
Indication for chronic opioid: ADD Medication and dose: Vyvanse 40 mg daily # pills per month: #30 Last UDS date: None Pain contract signed (Y/N): Yes, signed 05/21/15 Date narcotic database last reviewed (include red flags):   Please advise

## 2016-09-06 NOTE — Telephone Encounter (Signed)
Patient is aware of rx ready up front.

## 2016-09-07 ENCOUNTER — Ambulatory Visit: Payer: BLUE CROSS/BLUE SHIELD | Admitting: Physician Assistant

## 2016-09-07 ENCOUNTER — Ambulatory Visit (INDEPENDENT_AMBULATORY_CARE_PROVIDER_SITE_OTHER): Payer: BLUE CROSS/BLUE SHIELD | Admitting: Nurse Practitioner

## 2016-09-07 VITALS — BP 106/78 | HR 78 | Ht 66.0 in | Wt 210.0 lb

## 2016-09-07 DIAGNOSIS — I5022 Chronic systolic (congestive) heart failure: Secondary | ICD-10-CM

## 2016-09-07 LAB — CUP PACEART INCLINIC DEVICE CHECK
Implantable Lead Implant Date: 20161114
Implantable Lead Location: 753858
Implantable Lead Location: 753860
Implantable Lead Model: 5076
MDC IDC LEAD IMPLANT DT: 20161114
MDC IDC LEAD IMPLANT DT: 20161114
MDC IDC LEAD LOCATION: 753859
MDC IDC LEAD MODEL: 4598
MDC IDC PG IMPLANT DT: 20161114
MDC IDC SESS DTM: 20171207084703

## 2016-09-07 NOTE — Patient Instructions (Signed)
Medication Instructions:   Your physician recommends that you continue on your current medications as directed. Please refer to the Current Medication list given to you today.   If you need a refill on your cardiac medications before your next appointment, please call your pharmacy.  Labwork:NONE ORDERED  TODAY    Testing/Procedures: NONE ORDERED  TODAY    Follow-Up:  DR Mahalia Longest IN January   Your physician wants you to follow-up in: [place] will receive a reminder letter in the mail two months in advance. If you don't receive a letter, please call our office to schedule the follow-up appointment.   Remote monitoring is used to monitor your Pacemaker of ICD from home. This monitoring reduces the number of office visits required to check your device to one time per year. It allows [place] to keep an eye on the functioning of your device to ensure it is working properly. You are scheduled for a device check from home on .Marland KitchenMarland Kitchen3/8/18.Marland Kitchene sent automatically. After your physician reviews your transmission, you will receive a postcard with your next transmission date.    Any Other Special Instructions Will Be Listed Below (If Applicable).  YOU HAVE BEEN RECOMMENDED TO FOLLOW UP WITH LA[facility] FAILURE DEVICE SPECIALIST

## 2016-09-09 ENCOUNTER — Other Ambulatory Visit (HOSPITAL_COMMUNITY): Payer: Self-pay | Admitting: Internal Medicine

## 2016-09-09 ENCOUNTER — Other Ambulatory Visit (HOSPITAL_COMMUNITY): Payer: Self-pay | Admitting: Adult Health

## 2016-09-21 ENCOUNTER — Ambulatory Visit (INDEPENDENT_AMBULATORY_CARE_PROVIDER_SITE_OTHER): Payer: BLUE CROSS/BLUE SHIELD | Admitting: Physician Assistant

## 2016-09-21 ENCOUNTER — Encounter: Payer: Self-pay | Admitting: Physician Assistant

## 2016-09-21 VITALS — BP 106/60 | HR 60 | Temp 98.4°F | Resp 14 | Ht 66.0 in | Wt 209.0 lb

## 2016-09-21 DIAGNOSIS — F418 Other specified anxiety disorders: Secondary | ICD-10-CM | POA: Diagnosis not present

## 2016-09-21 DIAGNOSIS — F988 Other specified behavioral and emotional disorders with onset usually occurring in childhood and adolescence: Secondary | ICD-10-CM

## 2016-09-21 DIAGNOSIS — F419 Anxiety disorder, unspecified: Principal | ICD-10-CM

## 2016-09-21 DIAGNOSIS — F329 Major depressive disorder, single episode, unspecified: Secondary | ICD-10-CM

## 2016-09-21 MED ORDER — LISDEXAMFETAMINE DIMESYLATE 30 MG PO CAPS: 30.0000 mg | ORAL_CAPSULE | Freq: Every day | ORAL | 0 refills | Status: DC

## 2016-09-21 MED ORDER — BUPROPION HCL ER (XL) 150 MG PO TB24: 150.0000 mg | ORAL_TABLET | Freq: Every day | ORAL | 1 refills | Status: DC

## 2016-09-21 NOTE — Progress Notes (Signed)
Pre visit review using our clinic review tool, if applicable. No additional management support is needed unless otherwise documented below in the visit note. 

## 2016-09-21 NOTE — Assessment & Plan Note (Signed)
Failed multiple agents. Also with ADD. Discussed Wellbutrin to tackle both. Will start with Xl at 150 mg daily. Will wean down off of Vyvanse at the same time. Close eye on BP. FU scheduled.

## 2016-09-21 NOTE — Progress Notes (Signed)
Patient presents to clinic today for follow-up of ADD. Patient has been on Vyvanse 40 mg for quite some time. Has history of CHF and NICM s/p ICD placement. EF improving per Cardiology notes. Vitals have remained stable. Patient endorses taking medication as directed. Patient denies chest pain, palpitations, lightheadedness, dizziness, vision changes or frequent headaches.  Endorses continued depressed mood with anxiety. Denies SI/HI. Tried Trintellix but could not tolerate due to side effect. Has been on multiple SSRI without improvement.   Past Medical History:  Diagnosis Date  . ADD (attention deficit disorder)   . AICD (automatic cardioverter/defibrillator) present 08/20/2015  . Anxiety    "just when my daddy died a few years ago" (08-20-15)  . AR (allergic rhinitis)   . CHF (congestive heart failure) ([place]) dx'd 11/2014  . Chicken pox   . Depression    "just when my daddy died a few years ago" (August 20, 2015)  . Dyslipidemia   . Environmental allergies   . H/O vitamin D deficiency   . LBBB (left bundle branch block)    W/1st degree AV Block, Avoid Beta Blockers  . OSA (obstructive sleep apnea) 03/18/2015   Severe OSA with AHI 30/hr; "suppose to wear mask but don't" (08/20/15)  . Seasonal affective disorder [facility])     Current Outpatient Prescriptions on File Prior to Visit  Medication Sig Dispense Refill  . acetaminophen (TYLENOL) 500 MG tablet Take 500 mg by mouth every 6 (six) hours as needed for moderate pain.    Marland Kitchen b complex vitamins tablet Take 1 tablet by mouth daily.    . carvedilol (COREG) 12.5 MG tablet Take 1 tablet (12.5 mg total) by mouth 2 (two) times daily with a meal. 60 tablet 3  . fexofenadine (ALLEGRA) 180 MG tablet Take 1 tablet (180 mg total) by mouth daily.    . furosemide (LASIX) 20 MG tablet TAKE 1 TABLET(20 MG) BY MOUTH DAILY AS NEEDED 15 tablet 0  . lisinopril (PRINIVIL,ZESTRIL) 10 MG tablet TAKE 1 TABLET(10 MG) BY MOUTH AT BEDTIME 30 tablet 0  .  mometasone (NASONEX) 50 MCG/ACT nasal spray Place 2 sprays into the nose daily. 17 g 12  . potassium chloride (K-DUR,KLOR-CON) 10 MEQ tablet Take 2 tablets (20 mEq total) by mouth as needed (when take lasix). 30 tablet 3  . spironolactone (ALDACTONE) 25 MG tablet TAKE 1 TABLET(25 MG) BY MOUTH DAILY 30 tablet 6   No current facility-administered medications on file prior to visit.     Allergies  Allergen Reactions  . Bee Venom Anaphylaxis  . Erythromycin Itching and Rash  . Lovastatin Other (See Comments)    weakness  . Penicillins Hives, Itching and Rash  . Simvastatin Other (See Comments)    weakness    Family History  Problem Relation Age of Onset  . COPD Mother     Living  . Pancreatic cancer Father 17    Deceased  . Lung cancer Paternal Grandfather   . Heart disease Paternal Grandmother   . Hypertension Paternal Grandmother   . Alzheimer's disease Maternal Grandfather   . Arthritis/Rheumatoid Maternal Grandmother   . Heart disease Maternal Grandmother     Tachycardia  . Cancer Paternal Uncle   . Healthy Brother     x3  . Healthy Daughter     x3  . Breast cancer Sister 62    Social History   Social History  . Marital status: Divorced    Spouse name: N/A  . Number of children: N/A  .  Years of education: N/A   Social History Main Topics  . Smoking status: Never Smoker  . Smokeless tobacco: Never Used  . Alcohol use 2.4 oz/week    4 Glasses of wine per week  . Drug use: No  . Sexual activity: Not Currently   Other Topics Concern  . None   Social History Narrative  . None   Review of Systems - See HPI.  All other ROS are negative.  BP 106/60   Pulse 60   Temp 98.4 F (36.9 C) (Oral)   Resp 14   Ht 5\' 6"  (1.676 m)   Wt 209 lb (94.8 kg)   SpO2 98%   BMI 33.73 kg/m   Physical Exam  Constitutional: She is oriented to person, place, and time and well-developed, well-nourished, and in no distress.  HENT:  Head: Normocephalic and atraumatic.    Eyes: Conjunctivae are normal.  Neck: Neck supple.  Cardiovascular: Normal rate, regular rhythm, normal heart sounds and intact distal pulses.   Pulmonary/Chest: Effort normal and breath sounds normal. No respiratory distress. She has no wheezes. She has no rales. She exhibits no tenderness.  Neurological: She is alert and oriented to person, place, and time.  Skin: Skin is warm and dry. No rash noted.  Psychiatric: Affect normal.  Vitals reviewed.   Recent Results (from the past 2160 hour(s))  CULTURE, URINE COMPREHENSIVE     Status: None   Collection Time: 06/27/16  8:01 AM  Result Value Ref Range   Organism ID, Bacteria NO GROWTH   Urinalysis, Routine w reflex microscopic (not at [facility])     Status: Abnormal   Collection Time: 06/27/16  8:01 AM  Result Value Ref Range   Color, Urine YELLOW Yellow;Lt. Yellow   APPearance Sl Cloudy  (A) Clear   Specific Gravity, Urine >=1.030 (A) 1.000 - 1.030   pH 5.0 5.0 - 8.0   Total Protein, Urine NEGATIVE Negative   Urine Glucose NEGATIVE Negative   Ketones, ur NEGATIVE Negative   Bilirubin Urine NEGATIVE Negative   Hgb urine dipstick NEGATIVE Negative   Urobilinogen, UA 0.2 0.0 - 1.0   Leukocytes, UA Moderate (A) Negative   Nitrite NEGATIVE Negative   WBC, UA 21-50/hpf (A) 0-2/hpf   RBC / HPF 0-2/hpf 0-2/hpf   Mucus, UA Presence of (A) None   Squamous Epithelial / LPF Rare(0-4/hpf) Rare(0-4/hpf)   Bacteria, UA Few(10-50/hpf) (A) None  Implantable device check     Status: None   Collection Time: 09/07/16  1:46 PM  Result Value Ref Range   Pulse Generator Manufacturer MERM    Date Time Interrogation Session [id number]    Pulse Gen Model [id number] Viva Quad XT CRT-D    Pulse Gen Serial Number [id number]    Clinic Name [facility]    Implantable Pulse Generator Type Cardiac Resynch Therapy Defibulator    Implantable Pulse Generator Implant Date JK:9133365    Implantable Lead Manufacturer [facility]    Implantable Lead Model [id number]     Implantable Lead Serial Number [id number]    Implantable Lead Implant Date JK:9133365    Implantable Lead Location Detail 1 UNKNOWN    Implantable Lead Location [id number]    Implantable Lead Manufacturer [facility]    Implantable Lead Model 5076 CapSureFix Novus    Implantable Lead Serial Number [id number]    Implantable Lead Implant Date JK:9133365    Implantable Lead Location Detail 1 APPENDAGE    Implantable Lead Location [id number]    Implantable Lead Manufacturer MERM  Implantable Lead Model [id number] Sprint Quattro Secure S    Implantable Lead Serial Number [id number]    Implantable Lead Implant Date CI:1692577    Implantable Lead Location Detail 1 APEX    Implantable Lead Location [id number]     Assessment/Plan: Anxiety and depression Failed multiple agents. Also with ADD. Discussed Wellbutrin to tackle both. Will start with Xl at 150 mg daily. Will wean down off of Vyvanse at the same time. Close eye on BP. FU scheduled.   ADD (attention deficit disorder) With depression. Discussed continued stimulant use with her Cardiologist Benshimon who feels patient safe to continue stimulant medication. Discussed long-term potential effects of medication with patient. Since we are attempting Wellbutrin for depression and it has off-label use for ADHD, will wean down on the Vyvanse while we try low-dose ofWellbutrin. Close follow-up scheduled.     Leeanne Rio, PA-C

## 2016-09-21 NOTE — Assessment & Plan Note (Signed)
With depression. Discussed continued stimulant use with her Cardiologist Benshimon who feels patient safe to continue stimulant medication. Discussed long-term potential effects of medication with patient. Since we are attempting Wellbutrin for depression and it has off-label use for ADHD, will wean down on the Vyvanse while we try low-dose ofWellbutrin. Close follow-up scheduled.

## 2016-09-21 NOTE — Patient Instructions (Addendum)
Take Wellbutrin as directed Start the lower dose of the Vyvanse.  We will follow-up in 1 month.  Keep a close check on BP. If rising on Wellbutrin, please stop and call me.   Our goal is to get you off of the stimulant if possible. Dr. Missy Sabins has given ok to continue stimulant if needed but we would prefer to be off of it.

## 2016-09-22 ENCOUNTER — Telehealth: Payer: Self-pay

## 2016-09-22 NOTE — Telephone Encounter (Signed)
Referred to ICM clinic by Chanetta Marshall, PA.  Attempted ICM intro call and no answer.

## 2016-09-26 NOTE — Telephone Encounter (Signed)
Attempted ICM intro call and left message for return call.

## 2016-09-29 NOTE — Telephone Encounter (Signed)
Attempted 3rd ICM intro call and no answer.

## 2016-10-13 NOTE — Telephone Encounter (Signed)
Attempted ICM intro call to patient.  After several attempts unable to reach to for ICM program.  No enrollment at this time

## 2016-10-23 ENCOUNTER — Ambulatory Visit: Payer: BLUE CROSS/BLUE SHIELD | Admitting: Physician Assistant

## 2016-11-01 ENCOUNTER — Encounter (HOSPITAL_COMMUNITY): Payer: Self-pay | Admitting: Internal Medicine

## 2016-11-01 ENCOUNTER — Ambulatory Visit (INDEPENDENT_AMBULATORY_CARE_PROVIDER_SITE_OTHER): Payer: BLUE CROSS/BLUE SHIELD | Admitting: Physician Assistant

## 2016-11-01 ENCOUNTER — Encounter: Payer: Self-pay | Admitting: Physician Assistant

## 2016-11-01 ENCOUNTER — Ambulatory Visit (HOSPITAL_COMMUNITY)
Admission: RE | Admit: 2016-11-01 | Discharge: 2016-11-01 | Disposition: A | Discharge status: Home / Self Care | Payer: BLUE CROSS/BLUE SHIELD | Source: Ambulatory Visit | Attending: Internal Medicine | Admitting: Internal Medicine

## 2016-11-01 VITALS — BP 118/72 | HR 70 | Temp 98.3°F | Resp 14 | Ht 66.0 in | Wt 211.0 lb

## 2016-11-01 VITALS — BP 108/62 | HR 73 | Wt 212.0 lb

## 2016-11-01 DIAGNOSIS — Z79899 Other long term (current) drug therapy: Secondary | ICD-10-CM | POA: Diagnosis not present

## 2016-11-01 DIAGNOSIS — E785 Hyperlipidemia, unspecified: Secondary | ICD-10-CM

## 2016-11-01 DIAGNOSIS — I429 Cardiomyopathy, unspecified: Secondary | ICD-10-CM | POA: Insufficient documentation

## 2016-11-01 DIAGNOSIS — F418 Other specified anxiety disorders: Secondary | ICD-10-CM

## 2016-11-01 DIAGNOSIS — G4733 Obstructive sleep apnea (adult) (pediatric): Secondary | ICD-10-CM | POA: Diagnosis not present

## 2016-11-01 DIAGNOSIS — F419 Anxiety disorder, unspecified: Secondary | ICD-10-CM

## 2016-11-01 DIAGNOSIS — I447 Left bundle-branch block, unspecified: Secondary | ICD-10-CM

## 2016-11-01 DIAGNOSIS — F988 Other specified behavioral and emotional disorders with onset usually occurring in childhood and adolescence: Secondary | ICD-10-CM | POA: Diagnosis not present

## 2016-11-01 DIAGNOSIS — I5022 Chronic systolic (congestive) heart failure: Secondary | ICD-10-CM | POA: Diagnosis present

## 2016-11-01 DIAGNOSIS — F329 Major depressive disorder, single episode, unspecified: Secondary | ICD-10-CM

## 2016-11-01 LAB — BASIC METABOLIC PANEL
Anion gap: 9 (ref 5–15)
BUN: 19 mg/dL (ref 6–20)
CO2: 29 mmol/L (ref 22–32)
CREATININE: 1.33 mg/dL — AB (ref 0.44–1.00)
Calcium: 10 mg/dL (ref 8.9–10.3)
Chloride: 102 mmol/L (ref 101–111)
GFR, EST AFRICAN AMERICAN: 50 mL/min — AB (ref >60)
GFR, EST NON AFRICAN AMERICAN: 43 mL/min — AB (ref >60)
Glucose, Bld: 94 mg/dL (ref 65–99)
POTASSIUM: 4.6 mmol/L (ref 3.5–5.1)
SODIUM: 140 mmol/L (ref 135–145)

## 2016-11-01 LAB — CBC
HEMATOCRIT: 40 % (ref 36.0–46.0)
Hemoglobin: 13 g/dL (ref 12.0–15.0)
MCH: 29.8 pg (ref 26.0–34.0)
MCHC: 32.5 g/dL (ref 30.0–36.0)
MCV: 91.7 fL (ref 78.0–100.0)
PLATELETS: 272 10*3/uL (ref 150–400)
RBC: 4.36 MIL/uL (ref 3.87–5.11)
RDW: 13.6 % (ref 11.5–15.5)
WBC: 8.5 10*3/uL (ref 4.0–10.5)

## 2016-11-01 NOTE — Progress Notes (Signed)
Patient presents to clinic today for follow-up of ADD, depression/Anxiety. At last visit we discussed addition of Wellbutrin to help with her mood as well as ADD. Patient was started on SR 150 BID. At the same time her Vyvanse was decreased to 30 mg daily. Patient endorses getting a GI bug and so has only recently restarted her medications. States so far doing well with Vyvanse 30 mg. Has just restarted the Wellbutrin and is tolerating well. Patient denies chest pain, palpitations, lightheadedness, dizziness, vision changes or frequent headaches. Is followed by Cardiology for non-ischemic cardiomyopathy and HF. Have reviewed stimulant use with Dr. Sung Amabile who noted he is fine with her continuing medication as she is well-controlled from a cardiac standpoint.   Past Medical History:  Diagnosis Date  . ADD (attention deficit disorder)   . AICD (automatic cardioverter/defibrillator) present 08-22-15  . Anxiety    "just when my daddy died a few years ago" (2015-08-22)  . AR (allergic rhinitis)   . CHF (congestive heart failure) ([place]) dx'd 11/2014  . Chicken pox   . Depression    "just when my daddy died a few years ago" (08-22-15)  . Dyslipidemia   . Environmental allergies   . H/O vitamin D deficiency   . LBBB (left bundle branch block)    W/1st degree AV Block, Avoid Beta Blockers  . OSA (obstructive sleep apnea) 03/18/2015   Severe OSA with AHI 30/hr; "suppose to wear mask but don't" (08/22/15)  . Seasonal affective disorder [facility])     Current Outpatient Prescriptions on File Prior to Visit  Medication Sig Dispense Refill  . acetaminophen (TYLENOL) 500 MG tablet Take 500 mg by mouth every 6 (six) hours as needed for moderate pain.    Marland Kitchen b complex vitamins tablet Take 1 tablet by mouth daily.    Marland Kitchen buPROPion (WELLBUTRIN SR) 150 MG 12 hr tablet Take 150 mg by mouth daily.    . carvedilol (COREG) 12.5 MG tablet Take 1 tablet (12.5 mg total) by mouth 2 (two) times daily with a meal.  60 tablet 3  . fexofenadine (ALLEGRA) 180 MG tablet Take 1 tablet (180 mg total) by mouth daily.    . furosemide (LASIX) 20 MG tablet TAKE 1 TABLET(20 MG) BY MOUTH DAILY AS NEEDED 15 tablet 0  . lisdexamfetamine (VYVANSE) 30 MG capsule Take 1 capsule (30 mg total) by mouth daily. 30 capsule 0  . lisinopril (PRINIVIL,ZESTRIL) 10 MG tablet TAKE 1 TABLET(10 MG) BY MOUTH AT BEDTIME 30 tablet 0  . mometasone (NASONEX) 50 MCG/ACT nasal spray Place 2 sprays into the nose daily. 17 g 12  . potassium chloride (K-DUR,KLOR-CON) 10 MEQ tablet Take 2 tablets (20 mEq total) by mouth as needed (when take lasix). 30 tablet 3  . spironolactone (ALDACTONE) 25 MG tablet TAKE 1 TABLET(25 MG) BY MOUTH DAILY 30 tablet 6   No current facility-administered medications on file prior to visit.     Allergies  Allergen Reactions  . Bee Venom Anaphylaxis  . Erythromycin Itching and Rash  . Lovastatin Other (See Comments)    weakness  . Penicillins Hives, Itching and Rash  . Simvastatin Other (See Comments)    weakness    Family History  Problem Relation Age of Onset  . COPD Mother     Living  . Pancreatic cancer Father 77    Deceased  . Lung cancer Paternal Grandfather   . Heart disease Paternal Grandmother   . Hypertension Paternal Grandmother   . Alzheimer's  disease Maternal Grandfather   . Arthritis/Rheumatoid Maternal Grandmother   . Heart disease Maternal Grandmother     Tachycardia  . Cancer Paternal Uncle   . Healthy Brother     x3  . Healthy Daughter     x3  . Breast cancer Sister 38    Social History   Social History  . Marital status: Divorced    Spouse name: N/A  . Number of children: N/A  . Years of education: N/A   Social History Main Topics  . Smoking status: Never Smoker  . Smokeless tobacco: Never Used  . Alcohol use 2.4 oz/week    4 Glasses of wine per week  . Drug use: No  . Sexual activity: Not Currently   Other Topics Concern  . None   Social History Narrative    . None    Review of Systems - See HPI.  All other ROS are negative.  BP 118/72   Pulse 70   Temp 98.3 F (36.8 C) (Oral)   Resp 14   Ht _0  (1.676 m)   Wt 211 lb (95.7 kg)   SpO2 99%   BMI 34.06 kg/m   Physical Exam  Constitutional: She is oriented to person, place, and time and well-developed, well-nourished, and in no distress.  HENT:  Head: Normocephalic and atraumatic.  Eyes: Conjunctivae are normal.  Neck: Neck supple.  Cardiovascular: Normal rate, regular rhythm, normal heart sounds and intact distal pulses.   Pulmonary/Chest: Effort normal and breath sounds normal. No respiratory distress. She has no wheezes. She has no rales. She exhibits no tenderness.  Neurological: She is alert and oriented to person, place, and time.  Skin: Skin is warm and dry. No rash noted.  Psychiatric: Affect normal.  Vitals reviewed.  Recent Results (from the past 2160 hour(s))  Implantable device check     Status: None   Collection Time: 09/07/16  1:46 PM  Result Value Ref Range   Pulse Generator Manufacturer MERM    Date Time Interrogation Session [id number]    Pulse Gen Model [id number] Hillery Aldo XT CRT-D    Pulse Gen Serial Number [id number]    Clinic Name [facility]    Implantable Pulse Generator Type Cardiac Resynch Therapy Defibulator    Implantable Pulse Generator Implant Date [id number]    Implantable Lead Manufacturer [facility]    Implantable Lead Model 4598    Implantable Lead Serial Number [id number]    Implantable Lead Implant Date [id number]    Implantable Lead Location Detail 1 UNKNOWN    Implantable Lead Location [id number]    Implantable Lead Manufacturer [facility]    Implantable Lead Model 5076 CapSureFix Novus    Implantable Lead Serial Number [id number]    Implantable Lead Implant Date 76160737    Implantable Lead Location Detail 1 APPENDAGE    Implantable Lead Location [id number]    Implantable Lead Manufacturer [facility]    Implantable Lead Model [id number] Sprint Quattro  Secure S    Implantable Lead Serial Number [id number]    Implantable Lead Implant Date 94854627    Implantable Lead Location Detail 1 APEX    Implantable Lead Location [id number]   Basic metabolic panel     Status: Abnormal   Collection Time: 11/01/16  9:46 AM  Result Value Ref Range   Sodium 140 135 - 145 mmol/L   Potassium 4.6 3.5 - 5.1 mmol/L   Chloride 102 101 - 111 mmol/L   CO2 29 22 - 32 mmol/L   Glucose,  Bld 94 65 - 99 mg/dL   BUN 19 6 - 20 mg/dL   Creatinine, Ser 1.33 (H) 0.44 - 1.00 mg/dL   Calcium 10.0 8.9 - 10.3 mg/dL   GFR calc non Af Amer 43 (L) >60 mL/min   GFR calc Af Amer 50 (L) >60 mL/min    Comment: (NOTE) The eGFR has been calculated using the CKD EPI equation. This calculation has not been validated in all clinical situations. eGFR's persistently <60 mL/min signify possible Chronic Kidney Disease.    Anion gap 9 5 - 15  CBC     Status: None   Collection Time: 11/01/16  9:46 AM  Result Value Ref Range   WBC 8.5 4.0 - 10.5 K/uL   RBC 4.36 3.87 - 5.11 MIL/uL   Hemoglobin 13.0 12.0 - 15.0 g/dL   HCT 40.0 36.0 - 46.0 %   MCV 91.7 78.0 - 100.0 fL   MCH 29.8 26.0 - 34.0 pg   MCHC 32.5 30.0 - 36.0 g/dL   RDW 13.6 11.5 - 15.5 %   Platelets 272 150 - 400 K/uL    Assessment/Plan: Dyslipidemia Patient stopped her statin due to muscle aches. Has failed two statins due to myalgias. Discussed potential use of Livalo. Patient is willing if her Cardiologist agrees. Will send message to them to discuss at her request. Will start ASAP.   ADD (attention deficit disorder) Will continue Vyvanse 30 mg daily as she continues Wellbutrin. Hoping to get control of ADD and depressed mood with the Wellbutrin so we can back down further on the Vyvanse. FU scheduled.   Anxiety and depression Tolerating medication well thus far. Only taken for < 2 weeks. Will continue and FU 4 weeks for reassessment.     Leeanne Rio, PA-C

## 2016-11-01 NOTE — Progress Notes (Signed)
Advanced Heart Failure Clinic Note   Patient ID: Debra Holloway, female   DOB: 03/20/1956, 61 y.o.   MRN: [id number] Primary Cardiologist: Meda Coffee  HPI: Debra Holloway is a 61 year old woman with h/o ADD, LBBB and depression. She was admitted to the hospital on 11/14/2014 with ADHF.  Echocardiogram demonstrated LVEF15-20%. Underwent LHC which showed normal coronaries.    She underwent implantation of Medtronic CRT-D by Dr. Caryl Comes in November 2016.  She returns for HF follow up. Doing well. Had bronchitis over Xmas and still a bit SOB but otherwise. No real swelling. Had CRT oversensing in 12/17 and device reprogrammed. No swelling in finger. Remains active. No orthopnea or PND.   01/20/2015: CMRI- EF 29% RVEF 51% + dyssynchrony. No scar or infiltrative disease.  ICD interrogated today: No AF/VT. 3-4 hours activity. Optivol ok  Echo (6/16):  EF ~30% with significant dyssynchrony, moderate MR Echo (9/16):  EF 25-30%, Grade 1 DD ECHO 01/2016: EF 45-50%.   Labs 3/16: K 4.4 Creatinine 0.88 HIV NR Labs 4/16: K 3.7 Creatinine 0.90.  Labs 8/16: K 4.6 Creatinine 0.84 Labs 03/13/2016: K 4.7 Creatinine 1.16   ROS: All systems negative except as listed in HPI, PMH and Problem List.  Past Medical History:  Diagnosis Date  . ADD (attention deficit disorder)   . AICD (automatic cardioverter/defibrillator) present 09/03/2015  . Anxiety    "just when my daddy died a few years ago" (09/03/15)  . AR (allergic rhinitis)   . CHF (congestive heart failure) ([place]) dx'd 11/2014  . Chicken pox   . Depression    "just when my daddy died a few years ago" (09-03-2015)  . Dyslipidemia   . Environmental allergies   . H/O vitamin D deficiency   . LBBB (left bundle branch block)    W/1st degree AV Block, Avoid Beta Blockers  . OSA (obstructive sleep apnea) 03/18/2015   Severe OSA with AHI 30/hr; "suppose to wear mask but don't" (09-03-15)  . Seasonal affective disorder ([place])     SH:  Social History   Social  History  . Marital status: Divorced    Spouse name: N/A  . Number of children: N/A  . Years of education: N/A   Occupational History  . Not on file.   Social History Main Topics  . Smoking status: Never Smoker  . Smokeless tobacco: Never Used  . Alcohol use 2.4 oz/week    4 Glasses of wine per week  . Drug use: No  . Sexual activity: Not Currently   Other Topics Concern  . Not on file   Social History Narrative  . No narrative on file    FH:  Family History  Problem Relation Age of Onset  . COPD Mother     Living  . Pancreatic cancer Father 35    Deceased  . Lung cancer Paternal Grandfather   . Heart disease Paternal Grandmother   . Hypertension Paternal Grandmother   . Alzheimer's disease Maternal Grandfather   . Arthritis/Rheumatoid Maternal Grandmother   . Heart disease Maternal Grandmother     Tachycardia  . Cancer Paternal Uncle   . Healthy Brother     x3  . Healthy Daughter     x3  . Breast cancer Sister 8     Current Outpatient Prescriptions  Medication Sig Dispense Refill  . acetaminophen (TYLENOL) 500 MG tablet Take 500 mg by mouth every 6 (six) hours as needed for moderate pain.    Marland Kitchen b complex vitamins tablet Take  1 tablet by mouth daily.    Marland Kitchen buPROPion (WELLBUTRIN SR) 150 MG 12 hr tablet Take 150 mg by mouth daily.    . carvedilol (COREG) 12.5 MG tablet Take 1 tablet (12.5 mg total) by mouth 2 (two) times daily with a meal. 60 tablet 3  . fexofenadine (ALLEGRA) 180 MG tablet Take 1 tablet (180 mg total) by mouth daily.    Marland Kitchen lisdexamfetamine (VYVANSE) 30 MG capsule Take 1 capsule (30 mg total) by mouth daily. 30 capsule 0  . lisinopril (PRINIVIL,ZESTRIL) 10 MG tablet TAKE 1 TABLET(10 MG) BY MOUTH AT BEDTIME 30 tablet 0  . mometasone (NASONEX) 50 MCG/ACT nasal spray Place 2 sprays into the nose daily. 17 g 12  . spironolactone (ALDACTONE) 25 MG tablet TAKE 1 TABLET(25 MG) BY MOUTH DAILY 30 tablet 6  . furosemide (LASIX) 20 MG tablet TAKE 1 TABLET(20  MG) BY MOUTH DAILY AS NEEDED (Patient not taking: Reported on 11/01/2016) 15 tablet 0  . potassium chloride (K-DUR,KLOR-CON) 10 MEQ tablet Take 2 tablets (20 mEq total) by mouth as needed (when take lasix). (Patient not taking: Reported on 11/01/2016) 30 tablet 3   No current facility-administered medications for this encounter.     Vitals:   11/01/16 0904  BP: 108/62  Pulse: 73  SpO2: 100%  Weight: 212 lb (96.2 kg)    PHYSICAL EXAM:  General:  No resp difficulty. Ambulated in the clinic.  HEENT: normal Neck: supple. JVP flat Carotids 2+ bilaterally; no bruits. No lymphadenopathy or thryomegaly. Cor: PMI normal. Regular rate & rhythm. 2/6 SEM at RSB ICD site ok. Lungs: CTA Abdomen: obese, soft, NT, ND. No hepatosplenomegaly. No bruits or masses. Good bowel sounds. Extremities: no cyanosis, clubbing, rash, edema Neuro: alert & orientedx3, cranial nerves grossly intact. Moves all 4 extremities w/o difficulty. Affect pleasant but anxious  ASSESSMENT & PLAN:  1) Chronic systolic HF: NICM. EF 25-30% by echo in 9/16, stable from 6/16 of about 30%. Normal coronaries on cath. Possible viral CMP. cMRI in 4/16 did not show evidence for infiltrative disease.  Has Mectronic Optivol CRT-D . Fluid ok today. Activity around 3-4 hours per day.  ECHO from April 2017 with improved EF 45-50%.  --EF improved and stable NYHA II after CRT-D --Volume status looks on exam and by Optivol --Will repeat echo to ensure stability. -- Check labs today 2) LBBB: Chronic. S/p CRT-D 3) OSA: Mild. Unable to tolerate CPAP.  4) ADD and depression: Per PCP  Follow up 6 months.    Sumiya Mamaril,MD 9:35 AM

## 2016-11-01 NOTE — Patient Instructions (Signed)
Please start a saline nasal rinse to flush out your nasal passages and help with drainage. Continue chronic allergy medications. Place a humidifier in the bedroom. Use OTC cough medication from the list approved by Cardiology.  Let me know if symptoms are not continuing to improve.  Please continue Wellbutrin and lower-dose of Vyvanse Need to give more time to see how it is working for you. We will call you in a couple of weeks to re-assess and make adjustments if needed.  I am going to attempt to get Livalo covered for you to start for cholesterol. At your concern, I will run it by Dr. Missy Sabins as well. We need to get something started again since you stopped your other cholesterol medications due to side effect.  Follow-up with me in 2 months.

## 2016-11-01 NOTE — Patient Instructions (Signed)
Labs today  Your physician has requested that you have an echocardiogram. Echocardiography is a painless test that uses sound waves to create images of your heart. It provides your doctor with information about the size and shape of your heart and how well your heart's chambers and valves are working. This procedure takes approximately one hour. There are no restrictions for this procedure.  IN 6 MONTHS  We will contact you in 6 months to schedule your next appointment.

## 2016-11-01 NOTE — Addendum Note (Signed)
Encounter addended by: Scarlette Calico, RN on: 11/01/2016  9:46 AM<BR>    Actions taken: Order list changed, Diagnosis association updated, Sign clinical note

## 2016-11-01 NOTE — Progress Notes (Signed)
Pre visit review using our clinic review tool, if applicable. No additional management support is needed unless otherwise documented below in the visit note. 

## 2016-11-04 ENCOUNTER — Other Ambulatory Visit (HOSPITAL_COMMUNITY): Payer: Self-pay | Admitting: Adult Health

## 2016-11-04 ENCOUNTER — Other Ambulatory Visit (HOSPITAL_COMMUNITY): Payer: Self-pay | Admitting: Internal Medicine

## 2016-11-04 NOTE — Assessment & Plan Note (Signed)
Tolerating medication well thus far. Only taken for < 2 weeks. Will continue and FU 4 weeks for reassessment.

## 2016-11-04 NOTE — Assessment & Plan Note (Signed)
Patient stopped her statin due to muscle aches. Has failed two statins due to myalgias. Discussed potential use of Livalo. Patient is willing if her Cardiologist agrees. Will send message to them to discuss at her request. Will start ASAP.

## 2016-11-04 NOTE — Assessment & Plan Note (Signed)
Will continue Vyvanse 30 mg daily as she continues Wellbutrin. Hoping to get control of ADD and depressed mood with the Wellbutrin so we can back down further on the Vyvanse. FU scheduled.

## 2016-11-06 ENCOUNTER — Telehealth: Payer: Self-pay | Admitting: Emergency Medicine

## 2016-11-06 DIAGNOSIS — E785 Hyperlipidemia, unspecified: Secondary | ICD-10-CM

## 2016-11-06 MED ORDER — PITAVASTATIN CALCIUM 2 MG PO TABS: 1.0000 | ORAL_TABLET | Freq: Every day | ORAL | 3 refills | Status: DC

## 2016-11-06 NOTE — Telephone Encounter (Signed)
Advised patient of Cardiologist recommendations. He is agreeable with starting Livalo or she can try Crestor 5 mg qod.  Patient states she tried Crestor and did not want to restart due to muscle aches. She is agreeable with starting the Livalo. Will get PA for other medications failed.

## 2016-11-06 NOTE — Telephone Encounter (Signed)
-----   Message from Brunetta Jeans, PA-C sent at 11/06/2016  7:51 AM EST ----- Please call patient to let her know I have spoken with Cardiologist. He has given the ok for [place] to try the [place]. Another option he recommended is Crestor 5 mg (low-dose) starting every other day to reduce risk for muscle aches. I am fine doing either. The crestor (generic) would be cheaper so we could try first.   ----- Message ----- From: Jolaine Artist, MD Sent: 11/04/2016   9:13 PM To: Brunetta Jeans, PA-C  I think that is reasonable. What I usually do in these patients in Crestor 5 mg every other day or can consider PCSK-9 inhibitor. That said, I am fine with a trial of livalo. Thanks -dan   ----- Message ----- From: Brunetta Jeans, PA-C Sent: 11/03/2016  10:41 AM To: Jolaine Artist, MD  Hi Dr. Haroldine Laws,  I saw our mutual patient Debra Holloway this past week for follow-up. Wanting to restart statin therapy giving cholesterol and CV risk. She has had myalgias with statins previously. Wanted to put her on Livalo as it has a low side effect profile but better LDL reduction than Pravachol, Mevacor. She was adamant that I check with you before she starts the medication. As such I promised I would reach out to you. Any objections to the Livalo?  Brunetta Jeans, PA-C

## 2016-11-09 ENCOUNTER — Other Ambulatory Visit: Payer: Self-pay | Admitting: Internal Medicine

## 2016-12-02 ENCOUNTER — Other Ambulatory Visit (HOSPITAL_COMMUNITY): Payer: Self-pay | Admitting: Internal Medicine

## 2016-12-02 ENCOUNTER — Other Ambulatory Visit (HOSPITAL_COMMUNITY): Payer: Self-pay | Admitting: Adult Health

## 2016-12-02 ENCOUNTER — Other Ambulatory Visit: Payer: Self-pay | Admitting: Physician Assistant

## 2016-12-02 DIAGNOSIS — F329 Major depressive disorder, single episode, unspecified: Secondary | ICD-10-CM

## 2016-12-02 DIAGNOSIS — F419 Anxiety disorder, unspecified: Principal | ICD-10-CM

## 2016-12-05 ENCOUNTER — Telehealth: Payer: Self-pay | Admitting: Physician Assistant

## 2016-12-05 DIAGNOSIS — F988 Other specified behavioral and emotional disorders with onset usually occurring in childhood and adolescence: Secondary | ICD-10-CM

## 2016-12-05 NOTE — Telephone Encounter (Signed)
She needs an appointment so we can discuss everything going on and make the most appropriate changes.

## 2016-12-05 NOTE — Telephone Encounter (Signed)
Patient requesting refill of lisdexamfetamine (VYVANSE). Patient states she would like to go back on 40 mg dose as the 30 mg does not help her at all.  She spoke with her cardiologist about taking the Vyvanse and he feels she is ok to continue taking medication as she has been.

## 2016-12-05 NOTE — Telephone Encounter (Signed)
Spoke with patient and she states since being on the Vyvanse 30 mg she is feeling spaced out, not sharp and attentive, she states she feels like a "fish on the beach". She just found out her daughter has grade 3 breast cancer and wanted to be better to help her daughter.  She is taking the Wellbutrin but is still having crying spells. Please advise

## 2016-12-06 MED ORDER — LISDEXAMFETAMINE DIMESYLATE 30 MG PO CAPS: 30.0000 mg | ORAL_CAPSULE | Freq: Every day | ORAL | 0 refills | Status: DC

## 2016-12-06 NOTE — Telephone Encounter (Signed)
RX signed and ready up front for pick up. Patient is aware and will pick up today or tomorrow. Will make additional changes at appt on 12/27/16. She is agreeable

## 2016-12-06 NOTE — Telephone Encounter (Signed)
Ok to print refill of same dose and sig until her follow-up.

## 2016-12-06 NOTE — Telephone Encounter (Signed)
LMOVM advising patient she needs to schedule an appointment to discuss medication changes.

## 2016-12-06 NOTE — Addendum Note (Signed)
Addended by: Leonidas Romberg on: 12/06/2016 12:16 PM   Modules accepted: Orders

## 2016-12-06 NOTE — Telephone Encounter (Signed)
Pt called back regarding message,spoke to Patina stating that she has not followed up as needed, pt states that she has not missed a follow up appt with Einar Pheasant that she was seen on 1/31 and was told to follow up with him in 2 mths. She has her appt on 3/28. Pt is asking that cody refill her vyvanse as is and will discuss increase at her appt.

## 2016-12-06 NOTE — Telephone Encounter (Signed)
Vyvanse 30 mg 09/21/16 #30 Last ov: 11/01/16 FU in 4 weeks CSC: 05/21/15 UDS: none  Please advise

## 2016-12-07 ENCOUNTER — Telehealth: Payer: Self-pay | Admitting: Cardiology

## 2016-12-07 ENCOUNTER — Ambulatory Visit (INDEPENDENT_AMBULATORY_CARE_PROVIDER_SITE_OTHER): Payer: BLUE CROSS/BLUE SHIELD | Admitting: *Deleted

## 2016-12-07 DIAGNOSIS — I428 Other cardiomyopathies: Secondary | ICD-10-CM

## 2016-12-07 DIAGNOSIS — I5022 Chronic systolic (congestive) heart failure: Secondary | ICD-10-CM

## 2016-12-07 NOTE — Telephone Encounter (Signed)
Spoke with pt and reminded pt of remote transmission that is due today. Pt verbalized understanding.   

## 2016-12-08 ENCOUNTER — Encounter: Payer: Self-pay | Admitting: Cardiology

## 2016-12-08 LAB — CUP PACEART REMOTE DEVICE CHECK
Battery Remaining Longevity: 81 mo
Brady Statistic AP VP Percent: 0.2 %
Brady Statistic AS VP Percent: 99.51 %
Brady Statistic AS VS Percent: 0.28 %
Brady Statistic RA Percent Paced: 0.22 %
Brady Statistic RV Percent Paced: 99.57 %
Date Time Interrogation Session: 20180309124341
HighPow Impedance: 75 Ohm
Implantable Lead Implant Date: 20161114
Implantable Lead Location: 753859
Implantable Lead Location: 753860
Implantable Lead Model: 4598
Implantable Lead Model: 5076
Implantable Pulse Generator Implant Date: 20161114
Lead Channel Impedance Value: 342 Ohm
Lead Channel Impedance Value: 342 Ohm
Lead Channel Impedance Value: 361 Ohm
Lead Channel Impedance Value: 399 Ohm
Lead Channel Impedance Value: 456 Ohm
Lead Channel Impedance Value: 475 Ohm
Lead Channel Impedance Value: 513 Ohm
Lead Channel Impedance Value: 513 Ohm
Lead Channel Impedance Value: 589 Ohm
Lead Channel Impedance Value: 760 Ohm
Lead Channel Impedance Value: 760 Ohm
Lead Channel Pacing Threshold Amplitude: 0.625 V
Lead Channel Pacing Threshold Amplitude: 0.625 V
Lead Channel Pacing Threshold Amplitude: 1 V
Lead Channel Pacing Threshold Pulse Width: 0.4 ms
Lead Channel Pacing Threshold Pulse Width: 0.4 ms
Lead Channel Pacing Threshold Pulse Width: 0.4 ms
Lead Channel Sensing Intrinsic Amplitude: 2 mV
Lead Channel Sensing Intrinsic Amplitude: 2 mV
Lead Channel Setting Pacing Amplitude: 2 V
Lead Channel Setting Pacing Amplitude: 2.5 V
Lead Channel Setting Pacing Pulse Width: 0.4 ms
Lead Channel Setting Sensing Sensitivity: 0.45 mV
MDC IDC LEAD IMPLANT DT: 20161114
MDC IDC LEAD IMPLANT DT: 20161114
MDC IDC LEAD LOCATION: 753858
MDC IDC MSMT BATTERY VOLTAGE: 2.99 V
MDC IDC MSMT LEADCHNL LV IMPEDANCE VALUE: 608 Ohm
MDC IDC MSMT LEADCHNL LV IMPEDANCE VALUE: 722 Ohm
MDC IDC MSMT LEADCHNL RV SENSING INTR AMPL: 22.125 mV
MDC IDC MSMT LEADCHNL RV SENSING INTR AMPL: 22.125 mV
MDC IDC SET LEADCHNL LV PACING AMPLITUDE: 2.5 V
MDC IDC SET LEADCHNL LV PACING PULSEWIDTH: 0.4 ms
MDC IDC STAT BRADY AP VS PERCENT: 0.02 %

## 2016-12-08 NOTE — Progress Notes (Signed)
Remote ICD transmission.   

## 2016-12-27 ENCOUNTER — Encounter: Payer: Self-pay | Admitting: Physician Assistant

## 2016-12-27 ENCOUNTER — Ambulatory Visit (INDEPENDENT_AMBULATORY_CARE_PROVIDER_SITE_OTHER): Payer: BLUE CROSS/BLUE SHIELD | Admitting: Physician Assistant

## 2016-12-27 VITALS — BP 110/76 | HR 68 | Temp 98.7°F | Resp 14 | Ht 66.0 in | Wt 213.0 lb

## 2016-12-27 DIAGNOSIS — F988 Other specified behavioral and emotional disorders with onset usually occurring in childhood and adolescence: Secondary | ICD-10-CM | POA: Diagnosis not present

## 2016-12-27 DIAGNOSIS — F418 Other specified anxiety disorders: Secondary | ICD-10-CM

## 2016-12-27 DIAGNOSIS — E785 Hyperlipidemia, unspecified: Secondary | ICD-10-CM

## 2016-12-27 DIAGNOSIS — F419 Anxiety disorder, unspecified: Secondary | ICD-10-CM

## 2016-12-27 DIAGNOSIS — F329 Major depressive disorder, single episode, unspecified: Secondary | ICD-10-CM

## 2016-12-27 DIAGNOSIS — F32A Depression, unspecified: Secondary | ICD-10-CM

## 2016-12-27 MED ORDER — LISDEXAMFETAMINE DIMESYLATE 40 MG PO CAPS: 40.0000 mg | ORAL_CAPSULE | ORAL | 0 refills | Status: DC

## 2016-12-27 NOTE — Assessment & Plan Note (Signed)
Deteriorated. Will go back to prior dose of Vyvanse giving she tolerated well without change to vitals. Cardiology has given approval to continue stimulant medications. Risks and benefits of long-term stimulant use again reviewed with patient.

## 2016-12-27 NOTE — Patient Instructions (Addendum)
Please continue the Wellbutrin. Start your previous dose of the Vyvanse. I am comfortable restarting since your vitals are stable and Cardiology has given ok to continue your Vyvanse.   We will get a cologuard kit sent to you. We will also get the Livalo worked out so you can start ASAP.   Follow-up 4 weeks.

## 2016-12-27 NOTE — Progress Notes (Signed)
Patient presents to clinic today for follow-up of hyperlipidemia, ADD and anxiety/depression.   Hyperlipidemia -- Patient was started on Livalo at last visit due to intolerance to and refusal of other statins. Patient states she never received a call from her pharmacy regardng medication so she has not started. Is eating a well-balanced diet. BP well-controlled. Followed by Dr. Sung Amabile.   ADD -- Is taking Vyvanse 30 mg daily. Has noted significant difficulty with focus since going down to this dose. Wellbutrin is not helping focus. Is having significant issue getting things done.   Anxiety/Depression -- Does note improvement with her Wellbutrin. Is taking as directed. Feels calmer and not as tearful despite having to deal with her daughter's recent diagnosis of metastatic breast cancer.   Past Medical History:  Diagnosis Date  . ADD (attention deficit disorder)   . AICD (automatic cardioverter/defibrillator) present August 26, 2015  . Anxiety    "just when my daddy died a few years ago" (2015/08/26)  . AR (allergic rhinitis)   . CHF (congestive heart failure) ([place]) dx'd 11/2014  . Chicken pox   . Depression    "just when my daddy died a few years ago" (2015/08/26)  . Dyslipidemia   . Environmental allergies   . H/O vitamin D deficiency   . LBBB (left bundle branch block)    W/1st degree AV Block, Avoid Beta Blockers  . OSA (obstructive sleep apnea) 03/18/2015   Severe OSA with AHI 30/hr; "suppose to wear mask but don't" (08/26/2015)  . Seasonal affective disorder [facility])     Current Outpatient Prescriptions on File Prior to Visit  Medication Sig Dispense Refill  . acetaminophen (TYLENOL) 500 MG tablet Take 500 mg by mouth every 6 (six) hours as needed for moderate pain.    Marland Kitchen b complex vitamins tablet Take 1 tablet by mouth daily.    Marland Kitchen buPROPion (WELLBUTRIN XL) 150 MG 24 hr tablet TAKE 1 TABLET(150 MG) BY MOUTH DAILY 30 tablet 3  . carvedilol (COREG) 12.5 MG tablet Take 1 tablet (12.5  mg total) by mouth 2 (two) times daily with a meal. 60 tablet 3  . fexofenadine (ALLEGRA) 180 MG tablet Take 1 tablet (180 mg total) by mouth daily.    . furosemide (LASIX) 20 MG tablet TAKE 1 TABLET(20 MG) BY MOUTH DAILY AS NEEDED 15 tablet 2  . lisinopril (PRINIVIL,ZESTRIL) 10 MG tablet TAKE 1 TABLET(10 MG) BY MOUTH AT BEDTIME 30 tablet 2  . mometasone (NASONEX) 50 MCG/ACT nasal spray Place 2 sprays into the nose daily. 17 g 12  . Pitavastatin Calcium (LIVALO) 2 MG TABS Take 1 tablet (2 mg total) by mouth at bedtime. 30 tablet 3  . potassium chloride (K-DUR,KLOR-CON) 10 MEQ tablet Take 2 tablets (20 mEq total) by mouth as needed (when take lasix). 30 tablet 3  . spironolactone (ALDACTONE) 25 MG tablet TAKE 1 TABLET(25 MG) BY MOUTH DAILY 30 tablet 6   No current facility-administered medications on file prior to visit.     Allergies  Allergen Reactions  . Bee Venom Anaphylaxis  . Erythromycin Itching and Rash  . Lovastatin Other (See Comments)    weakness  . Penicillins Hives, Itching and Rash  . Simvastatin Other (See Comments)    weakness    Family History  Problem Relation Age of Onset  . COPD Mother     Living  . Pancreatic cancer Father 53    Deceased  . Lung cancer Paternal Grandfather   . Heart disease Paternal Grandmother   .  Hypertension Paternal Grandmother   . Alzheimer's disease Maternal Grandfather   . Arthritis/Rheumatoid Maternal Grandmother   . Heart disease Maternal Grandmother     Tachycardia  . Cancer Paternal Uncle   . Healthy Brother     x3  . Healthy Daughter     x3  . Breast cancer Sister 82    Social History   Social History  . Marital status: Divorced    Spouse name: N/A  . Number of children: N/A  . Years of education: N/A   Social History Main Topics  . Smoking status: Never Smoker  . Smokeless tobacco: Never Used  . Alcohol use 2.4 oz/week    4 Glasses of wine per week  . Drug use: No  . Sexual activity: Not Currently   Other  Topics Concern  . None   Social History Narrative  . None   Review of Systems - See HPI.  All other ROS are negative.  BP 110/76   Pulse 68   Temp 98.7 F (37.1 C) (Oral)   Resp 14   Ht _0  (1.676 m)   Wt 213 lb (96.6 kg)   SpO2 98%   BMI 34.38 kg/m   Physical Exam  Constitutional: She is oriented to person, place, and time and well-developed, well-nourished, and in no distress.  HENT:  Head: Normocephalic and atraumatic.  Eyes: Conjunctivae are normal.  Neck: Neck supple.  Cardiovascular: Normal rate, regular rhythm, normal heart sounds and intact distal pulses.   Pulmonary/Chest: Effort normal and breath sounds normal. No respiratory distress. She has no wheezes. She has no rales. She exhibits no tenderness.  Neurological: She is alert and oriented to person, place, and time.  Skin: Skin is warm and dry. No rash noted.  Psychiatric: Affect normal.  Vitals reviewed.   Recent Results (from the past 2160 hour(s))  Basic metabolic panel     Status: Abnormal   Collection Time: 11/01/16  9:46 AM  Result Value Ref Range   Sodium 140 135 - 145 mmol/L   Potassium 4.6 3.5 - 5.1 mmol/L   Chloride 102 101 - 111 mmol/L   CO2 29 22 - 32 mmol/L   Glucose, Bld 94 65 - 99 mg/dL   BUN 19 6 - 20 mg/dL   Creatinine, Ser 1.33 (H) 0.44 - 1.00 mg/dL   Calcium 10.0 8.9 - 10.3 mg/dL   GFR calc non Af Amer 43 (L) >60 mL/min   GFR calc Af Amer 50 (L) >60 mL/min    Comment: (NOTE) The eGFR has been calculated using the CKD EPI equation. This calculation has not been validated in all clinical situations. eGFR's persistently <60 mL/min signify possible Chronic Kidney Disease.    Anion gap 9 5 - 15  CBC     Status: None   Collection Time: 11/01/16  9:46 AM  Result Value Ref Range   WBC 8.5 4.0 - 10.5 K/uL   RBC 4.36 3.87 - 5.11 MIL/uL   Hemoglobin 13.0 12.0 - 15.0 g/dL   HCT 40.0 36.0 - 46.0 %   MCV 91.7 78.0 - 100.0 fL   MCH 29.8 26.0 - 34.0 pg   MCHC 32.5 30.0 - 36.0 g/dL   RDW  13.6 11.5 - 15.5 %   Platelets 272 150 - 400 K/uL  CUP PACEART REMOTE DEVICE CHECK     Status: None   Collection Time: 12/08/16 12:46 PM  Result Value Ref Range   Date Time Interrogation Session [id number]  Pulse Generator Manufacturer MERM    Pulse Gen Model [id number] Viva Javier Docker XT CRT-D    Pulse Gen Serial Number [id number]    Clinic Name [facility]    Implantable Pulse Generator Type Cardiac Resynch Therapy Defibulator    Implantable Pulse Generator Implant Date [id number]    Implantable Lead Manufacturer MERM    Implantable Lead Model 4598 Attain Performa S MRI SureScan    Implantable Lead Serial Number [id number]    Implantable Lead Implant Date 02637858    Implantable Lead Location Detail 1 UNKNOWN    Implantable Lead Location [id number]    Implantable Lead Manufacturer MERM    Implantable Lead Model 5076 CapSureFix Novus    Implantable Lead Serial Number [id number]    Implantable Lead Implant Date [id number]    Implantable Lead Location Detail 1 APPENDAGE    Implantable Lead Location [id number]    Implantable Lead Manufacturer [facility]    Implantable Lead Model [id number] Sprint Quattro Secure S    Implantable Lead Serial Number [id number]    Implantable Lead Implant Date 62836629    Implantable Lead Location Detail 1 APEX    Implantable Lead Location [id number]    Lead Channel Setting Sensing Sensitivity 0.45 mV   Lead Channel Setting Pacing Amplitude 2 V   Lead Channel Setting Pacing Pulse Width 0.4 ms   Lead Channel Setting Pacing Amplitude 2.5 V   Lead Channel Setting Pacing Pulse Width 0.4 ms   Lead Channel Setting Pacing Amplitude 2.5 V   Lead Channel Setting Pacing Capture Mode Adaptive Capture    Lead Channel Impedance Value 513 ohm   Lead Channel Sensing Intrinsic Amplitude 2 mV   Lead Channel Sensing Intrinsic Amplitude 2 mV   Lead Channel Pacing Threshold Amplitude 0.625 V   Lead Channel Pacing Threshold Pulse Width 0.4 ms   Lead Channel Impedance Value 456 ohm   Lead  Channel Impedance Value 342 ohm   Lead Channel Sensing Intrinsic Amplitude 22.125 mV   Lead Channel Sensing Intrinsic Amplitude 22.125 mV   Lead Channel Pacing Threshold Amplitude 0.625 V   Lead Channel Pacing Threshold Pulse Width 0.4 ms   HighPow Impedance 75 ohm   Lead Channel Impedance Value 760 ohm   Lead Channel Impedance Value 760 ohm   Lead Channel Impedance Value 722 ohm   Lead Channel Impedance Value 513 ohm   Lead Channel Impedance Value 589 ohm   Lead Channel Impedance Value 608 ohm   Lead Channel Impedance Value 475 ohm   Lead Channel Impedance Value 361 ohm   Lead Channel Impedance Value 399 ohm   Lead Channel Impedance Value 342 ohm   Lead Channel Pacing Threshold Amplitude 1 V   Lead Channel Pacing Threshold Pulse Width 0.4 ms   Battery Status OK    Battery Remaining Longevity 81 mo   Battery Voltage 2.99 V   Brady Statistic RA Percent Paced 0.22 %   Brady Statistic RV Percent Paced 99.57 %   Brady Statistic AP VP Percent 0.20 %   Brady Statistic AS VP Percent 99.51 %   Brady Statistic AP VS Percent 0.02 %   Brady Statistic AS VS Percent 0.28 %   Eval Rhythm AsBp     Assessment/Plan: Anxiety and depression Improved with Wellbutrin. Vitals within normal limits. Continue current regimen.  ADD (attention deficit disorder) Deteriorated. Will go back to prior dose of Vyvanse giving she tolerated well without change to vitals. Cardiology has given approval to continue stimulant medications. Risks and benefits  of long-term stimulant use again reviewed with patient.   Dyslipidemia Has not picked up Livalo. Will check on status of this to double check medication was approved so she can start. F/U 4 weeks for repeat assessment, fasting lipids and LFTs.    Leeanne Rio, PA-C

## 2016-12-27 NOTE — Assessment & Plan Note (Signed)
Improved with Wellbutrin. Vitals within normal limits. Continue current regimen.

## 2016-12-27 NOTE — Progress Notes (Signed)
Pre visit review using our clinic review tool, if applicable. No additional management support is needed unless otherwise documented below in the visit note. 

## 2016-12-27 NOTE — Assessment & Plan Note (Signed)
Has not picked up Livalo. Will check on status of this to double check medication was approved so she can start. F/U 4 weeks for repeat assessment, fasting lipids and LFTs.

## 2017-01-08 ENCOUNTER — Other Ambulatory Visit (HOSPITAL_COMMUNITY): Payer: Self-pay | Admitting: Internal Medicine

## 2017-01-24 ENCOUNTER — Ambulatory Visit: Payer: BLUE CROSS/BLUE SHIELD | Admitting: Physician Assistant

## 2017-01-30 NOTE — Progress Notes (Signed)
Patient presents to clinic today for follow-up of ADD and for repeat assessment of BP. At last visit, patient's Vyvanse was increased to prior dose as she was not getting significant improvement with lower dose. Patient endorses taking medication as directed and doing very well. Notes good focus with this regimen. Denies racing heart, chest pain, insomnia or anorexia. .  BP Readings from Last 3 Encounters:  01/31/17 108/60  12/27/16 110/76  11/01/16 108/62   Patient was recently started on Livalo. She has had intolerance to stronger statins previously including Lipitor and Crestor. Attempted Livalo giving low side effect profile. Notes taking for 2 weeks before stopping. States it gave her flu-like symptoms, mainly malaise and fatigue. Would like to discuss options.   Past Medical History:  Diagnosis Date  . ADD (attention deficit disorder)   . AICD (automatic cardioverter/defibrillator) present 09-02-15  . Anxiety    "just when my daddy died a few years ago" (09-02-2015)  . AR (allergic rhinitis)   . CHF (congestive heart failure) ([place]) dx'd 11/2014  . Chicken pox   . Depression    "just when my daddy died a few years ago" (09/02/15)  . Dyslipidemia   . Environmental allergies   . H/O vitamin D deficiency   . LBBB (left bundle branch block)    W/1st degree AV Block, Avoid Beta Blockers  . OSA (obstructive sleep apnea) 03/18/2015   Severe OSA with AHI 30/hr; "suppose to wear mask but don't" (09-02-15)  . Seasonal affective disorder [facility])     Current Outpatient Prescriptions on File Prior to Visit  Medication Sig Dispense Refill  . acetaminophen (TYLENOL) 500 MG tablet Take 500 mg by mouth every 6 (six) hours as needed for moderate pain.    Marland Kitchen b complex vitamins tablet Take 1 tablet by mouth daily.    Marland Kitchen buPROPion (WELLBUTRIN XL) 150 MG 24 hr tablet TAKE 1 TABLET(150 MG) BY MOUTH DAILY 30 tablet 3  . carvedilol (COREG) 12.5 MG tablet Take 1 tablet (12.5 mg total) by mouth 2  (two) times daily with a meal. 60 tablet 3  . fexofenadine (ALLEGRA) 180 MG tablet Take 1 tablet (180 mg total) by mouth daily.    . furosemide (LASIX) 20 MG tablet TAKE 1 TABLET(20 MG) BY MOUTH DAILY AS NEEDED 15 tablet 2  . lisinopril (PRINIVIL,ZESTRIL) 10 MG tablet TAKE 1 TABLET(10 MG) BY MOUTH AT BEDTIME 30 tablet 2  . mometasone (NASONEX) 50 MCG/ACT nasal spray Place 2 sprays into the nose daily. 17 g 12  . potassium chloride (K-DUR,KLOR-CON) 10 MEQ tablet Take 2 tablets (20 mEq total) by mouth as needed (when take lasix). 30 tablet 3  . spironolactone (ALDACTONE) 25 MG tablet TAKE 1 TABLET(25 MG) BY MOUTH DAILY 30 tablet 6   No current facility-administered medications on file prior to visit.     Allergies  Allergen Reactions  . Bee Venom Anaphylaxis  . Erythromycin Itching and Rash  . Lovastatin Other (See Comments)    weakness  . Penicillins Hives, Itching and Rash  . Simvastatin Other (See Comments)    weakness    Family History  Problem Relation Age of Onset  . COPD Mother     Living  . Pancreatic cancer Father 31    Deceased  . Lung cancer Paternal Grandfather   . Heart disease Paternal Grandmother   . Hypertension Paternal Grandmother   . Alzheimer's disease Maternal Grandfather   . Arthritis/Rheumatoid Maternal Grandmother   . Heart disease Maternal Grandmother  Tachycardia  . Cancer Paternal Uncle   . Healthy Brother     x3  . Healthy Daughter     x3  . Breast cancer Sister 31    Social History   Social History  . Marital status: Divorced    Spouse name: N/A  . Number of children: N/A  . Years of education: N/A   Social History Main Topics  . Smoking status: Never Smoker  . Smokeless tobacco: Never Used  . Alcohol use 2.4 oz/week    4 Glasses of wine per week  . Drug use: No  . Sexual activity: Not Currently   Other Topics Concern  . None   Social History Narrative  . None    Review of Systems - See HPI.  All other ROS are  negative.  BP 108/60   Pulse 65   Temp 98.6 F (37 C) (Oral)   Resp 14   Ht 5\' 6"  (1.676 m)   Wt 211 lb (95.7 kg)   SpO2 98%   BMI 34.06 kg/m   Physical Exam  Recent Results (from the past 2160 hour(s))  CUP PACEART REMOTE DEVICE CHECK     Status: None   Collection Time: 12/08/16 12:46 PM  Result Value Ref Range   Date Time Interrogation Session [id number]    Pulse Generator Manufacturer MERM    Pulse Gen Model [id number] Viva Quad XT CRT-D    Pulse Gen Serial Number [id number]    Clinic Name [place]    Implantable Pulse Generator Type Cardiac Resynch Therapy Defibulator    Implantable Pulse Generator Implant Date [id number]    Implantable Lead Manufacturer MERM    Implantable Lead Model 4598 Attain Performa S MRI SureScan    Implantable Lead Serial Number [id number]    Implantable Lead Implant Date 63785885    Implantable Lead Location Detail 1 UNKNOWN    Implantable Lead Location [id number]    Implantable Lead Manufacturer MERM    Implantable Lead Model 5076 CapSureFix Novus    Implantable Lead Serial Number [id number]    Implantable Lead Implant Date 02774128    Implantable Lead Location Detail 1 APPENDAGE    Implantable Lead Location [id number]    Implantable Lead Manufacturer [facility]    Implantable Lead Model [id number] Sprint Quattro Secure S    Implantable Lead Serial Number [id number]    Implantable Lead Implant Date 72094709    Implantable Lead Location Detail 1 APEX    Implantable Lead Location [id number]    Lead Channel Setting Sensing Sensitivity 0.45 mV   Lead Channel Setting Pacing Amplitude 2 V   Lead Channel Setting Pacing Pulse Width 0.4 ms   Lead Channel Setting Pacing Amplitude 2.5 V   Lead Channel Setting Pacing Pulse Width 0.4 ms   Lead Channel Setting Pacing Amplitude 2.5 V   Lead Channel Setting Pacing Capture Mode Adaptive Capture    Lead Channel Impedance Value 513 ohm   Lead Channel Sensing Intrinsic Amplitude 2 mV   Lead Channel Sensing Intrinsic  Amplitude 2 mV   Lead Channel Pacing Threshold Amplitude 0.625 V   Lead Channel Pacing Threshold Pulse Width 0.4 ms   Lead Channel Impedance Value 456 ohm   Lead Channel Impedance Value 342 ohm   Lead Channel Sensing Intrinsic Amplitude 22.125 mV   Lead Channel Sensing Intrinsic Amplitude 22.125 mV   Lead Channel Pacing Threshold Amplitude 0.625 V   Lead Channel Pacing Threshold Pulse Width 0.4 ms   HighPow Impedance 75 ohm  Lead Channel Impedance Value 760 ohm   Lead Channel Impedance Value 760 ohm   Lead Channel Impedance Value 722 ohm   Lead Channel Impedance Value 513 ohm   Lead Channel Impedance Value 589 ohm   Lead Channel Impedance Value 608 ohm   Lead Channel Impedance Value 475 ohm   Lead Channel Impedance Value 361 ohm   Lead Channel Impedance Value 399 ohm   Lead Channel Impedance Value 342 ohm   Lead Channel Pacing Threshold Amplitude 1 V   Lead Channel Pacing Threshold Pulse Width 0.4 ms   Battery Status OK    Battery Remaining Longevity 81 mo   Battery Voltage 2.99 V   Brady Statistic RA Percent Paced 0.22 %   Brady Statistic RV Percent Paced 99.57 %   Brady Statistic AP VP Percent 0.20 %   Brady Statistic AS VP Percent 99.51 %   Brady Statistic AP VS Percent 0.02 %   Brady Statistic AS VS Percent 0.28 %   Eval Rhythm AsBp     Assessment/Plan: ADD (attention deficit disorder) Improvement with restarting prior dose of Vyvanse. Vitals stable. Continue current regimen.   Dyslipidemia Livalo stopped. Will attempt Pravachol at very low dose, taking every other day. Will see how she tolerates. If unable to tolerate will need a consultation with lipid clinic.    Leeanne Rio, PA-C

## 2017-01-31 ENCOUNTER — Encounter: Payer: Self-pay | Admitting: Physician Assistant

## 2017-01-31 ENCOUNTER — Ambulatory Visit (INDEPENDENT_AMBULATORY_CARE_PROVIDER_SITE_OTHER): Payer: BLUE CROSS/BLUE SHIELD | Admitting: Physician Assistant

## 2017-01-31 VITALS — BP 108/60 | HR 65 | Temp 98.6°F | Resp 14 | Ht 66.0 in | Wt 211.0 lb

## 2017-01-31 DIAGNOSIS — F988 Other specified behavioral and emotional disorders with onset usually occurring in childhood and adolescence: Secondary | ICD-10-CM

## 2017-01-31 DIAGNOSIS — E785 Hyperlipidemia, unspecified: Secondary | ICD-10-CM | POA: Diagnosis not present

## 2017-01-31 MED ORDER — LISDEXAMFETAMINE DIMESYLATE 40 MG PO CAPS: 40.0000 mg | ORAL_CAPSULE | ORAL | 0 refills | Status: DC

## 2017-01-31 MED ORDER — PRAVASTATIN SODIUM 20 MG PO TABS: 10.0000 mg | ORAL_TABLET | Freq: Every day | ORAL | 0 refills | Status: DC

## 2017-01-31 MED ORDER — LISDEXAMFETAMINE DIMESYLATE 40 MG PO CAPS
40.0000 mg | ORAL_CAPSULE | ORAL | 0 refills | Status: DC
Start: 2017-01-31 — End: 2017-01-31

## 2017-01-31 NOTE — Assessment & Plan Note (Signed)
Livalo stopped. Will attempt Pravachol at very low dose, taking every other day. Will see how she tolerates. If unable to tolerate will need a consultation with lipid clinic.

## 2017-01-31 NOTE — Assessment & Plan Note (Signed)
Improvement with restarting prior dose of Vyvanse. Vitals stable. Continue current regimen.

## 2017-01-31 NOTE — Progress Notes (Signed)
Pre visit review using our clinic review tool, if applicable. No additional management support is needed unless otherwise documented below in the visit note. 

## 2017-01-31 NOTE — Patient Instructions (Signed)
Please continue current medication regimen with the following exception:  We are going to make a last effort with a statin medication -- pravachol. I want you to take 1/2 tablet every other day for 2 weeks to make sure you tolerate. Then we will try to increase to 1/2 tablet daily. If you note any aches, weakness, etc, stop the medication and call me.  Otherwise I will be calling you in a week to see how you are doing.   Follow-up for ADD in 3 months.

## 2017-03-09 ENCOUNTER — Ambulatory Visit (INDEPENDENT_AMBULATORY_CARE_PROVIDER_SITE_OTHER): Payer: BLUE CROSS/BLUE SHIELD | Admitting: *Deleted

## 2017-03-09 DIAGNOSIS — I428 Other cardiomyopathies: Secondary | ICD-10-CM

## 2017-03-09 DIAGNOSIS — I5022 Chronic systolic (congestive) heart failure: Secondary | ICD-10-CM

## 2017-03-09 NOTE — Progress Notes (Signed)
Remote ICD transmission.   

## 2017-03-11 ENCOUNTER — Other Ambulatory Visit (HOSPITAL_COMMUNITY): Payer: Self-pay | Admitting: Internal Medicine

## 2017-03-13 ENCOUNTER — Encounter: Payer: Self-pay | Admitting: Cardiology

## 2017-03-14 LAB — CUP PACEART REMOTE DEVICE CHECK
Brady Statistic AP VP Percent: 0.36 %
Brady Statistic AP VS Percent: 0.04 %
Brady Statistic AS VP Percent: 99.39 %
Brady Statistic AS VS Percent: 0.21 %
Brady Statistic RA Percent Paced: 0.4 %
Brady Statistic RV Percent Paced: 99.72 %
HighPow Impedance: 74 Ohm
Implantable Lead Implant Date: 20161114
Implantable Lead Implant Date: 20161114
Implantable Lead Location: 753858
Implantable Pulse Generator Implant Date: 20161114
Lead Channel Impedance Value: 361 Ohm
Lead Channel Impedance Value: 399 Ohm
Lead Channel Impedance Value: 399 Ohm
Lead Channel Impedance Value: 399 Ohm
Lead Channel Impedance Value: 456 Ohm
Lead Channel Impedance Value: 513 Ohm
Lead Channel Impedance Value: 532 Ohm
Lead Channel Impedance Value: 665 Ohm
Lead Channel Impedance Value: 703 Ohm
Lead Channel Impedance Value: 836 Ohm
Lead Channel Pacing Threshold Amplitude: 0.625 V
Lead Channel Pacing Threshold Pulse Width: 0.4 ms
Lead Channel Pacing Threshold Pulse Width: 0.4 ms
Lead Channel Pacing Threshold Pulse Width: 0.4 ms
Lead Channel Sensing Intrinsic Amplitude: 1.125 mV
Lead Channel Sensing Intrinsic Amplitude: 1.125 mV
Lead Channel Sensing Intrinsic Amplitude: 26.625 mV
Lead Channel Sensing Intrinsic Amplitude: 26.625 mV
Lead Channel Setting Pacing Amplitude: 2 V
Lead Channel Setting Pacing Amplitude: 2.5 V
Lead Channel Setting Pacing Pulse Width: 0.4 ms
Lead Channel Setting Pacing Pulse Width: 0.4 ms
Lead Channel Setting Sensing Sensitivity: 0.45 mV
MDC IDC LEAD IMPLANT DT: 20161114
MDC IDC LEAD LOCATION: 753859
MDC IDC LEAD LOCATION: 753860
MDC IDC MSMT BATTERY REMAINING LONGEVITY: 77 mo
MDC IDC MSMT BATTERY VOLTAGE: 2.99 V
MDC IDC MSMT LEADCHNL LV IMPEDANCE VALUE: 817 Ohm
MDC IDC MSMT LEADCHNL LV IMPEDANCE VALUE: 836 Ohm
MDC IDC MSMT LEADCHNL LV PACING THRESHOLD AMPLITUDE: 0.875 V
MDC IDC MSMT LEADCHNL RA PACING THRESHOLD AMPLITUDE: 0.625 V
MDC IDC MSMT LEADCHNL RV IMPEDANCE VALUE: 418 Ohm
MDC IDC SESS DTM: 20180608084222
MDC IDC SET LEADCHNL LV PACING AMPLITUDE: 2.5 V

## 2017-04-21 ENCOUNTER — Other Ambulatory Visit (HOSPITAL_COMMUNITY): Payer: Self-pay | Admitting: Internal Medicine

## 2017-05-24 ENCOUNTER — Ambulatory Visit (HOSPITAL_COMMUNITY)
Admission: RE | Admit: 2017-05-24 | Discharge: 2017-05-24 | Disposition: A | Discharge status: Home / Self Care | Payer: PPO | Source: Ambulatory Visit | Attending: Physician Assistant | Admitting: Physician Assistant

## 2017-05-24 ENCOUNTER — Encounter (HOSPITAL_COMMUNITY): Payer: Self-pay | Admitting: Internal Medicine

## 2017-05-24 ENCOUNTER — Ambulatory Visit (HOSPITAL_BASED_OUTPATIENT_CLINIC_OR_DEPARTMENT_OTHER)
Admission: RE | Admit: 2017-05-24 | Discharge: 2017-05-24 | Disposition: A | Discharge status: Home / Self Care | Payer: PPO | Source: Ambulatory Visit | Attending: Internal Medicine | Admitting: Internal Medicine

## 2017-05-24 VITALS — BP 125/73 | HR 70 | Wt 213.0 lb

## 2017-05-24 DIAGNOSIS — G4733 Obstructive sleep apnea (adult) (pediatric): Secondary | ICD-10-CM | POA: Diagnosis not present

## 2017-05-24 DIAGNOSIS — I447 Left bundle-branch block, unspecified: Secondary | ICD-10-CM | POA: Insufficient documentation

## 2017-05-24 DIAGNOSIS — E785 Hyperlipidemia, unspecified: Secondary | ICD-10-CM | POA: Insufficient documentation

## 2017-05-24 DIAGNOSIS — I5022 Chronic systolic (congestive) heart failure: Secondary | ICD-10-CM

## 2017-05-24 DIAGNOSIS — F988 Other specified behavioral and emotional disorders with onset usually occurring in childhood and adolescence: Secondary | ICD-10-CM | POA: Insufficient documentation

## 2017-05-24 DIAGNOSIS — E559 Vitamin D deficiency, unspecified: Secondary | ICD-10-CM | POA: Diagnosis not present

## 2017-05-24 DIAGNOSIS — I429 Cardiomyopathy, unspecified: Secondary | ICD-10-CM | POA: Diagnosis not present

## 2017-05-24 DIAGNOSIS — F329 Major depressive disorder, single episode, unspecified: Secondary | ICD-10-CM | POA: Diagnosis not present

## 2017-05-24 DIAGNOSIS — Z95810 Presence of automatic (implantable) cardiac defibrillator: Secondary | ICD-10-CM | POA: Diagnosis not present

## 2017-05-24 DIAGNOSIS — F419 Anxiety disorder, unspecified: Secondary | ICD-10-CM | POA: Insufficient documentation

## 2017-05-24 LAB — BASIC METABOLIC PANEL
Anion gap: 7 (ref 5–15)
BUN: 18 mg/dL (ref 6–20)
CO2: 28 mmol/L (ref 22–32)
CREATININE: 1.2 mg/dL — AB (ref 0.44–1.00)
Calcium: 9.8 mg/dL (ref 8.9–10.3)
Chloride: 103 mmol/L (ref 101–111)
GFR calc Af Amer: 56 mL/min — ABNORMAL LOW (ref >60)
GFR, EST NON AFRICAN AMERICAN: 48 mL/min — AB (ref >60)
GLUCOSE: 94 mg/dL (ref 65–99)
Potassium: 4.4 mmol/L (ref 3.5–5.1)
SODIUM: 138 mmol/L (ref 135–145)

## 2017-05-24 NOTE — Progress Notes (Signed)
Advanced Heart Failure Clinic Note   Patient ID: Debra Holloway, female   DOB: 27-Dec-1955, 61 y.o.   MRN: [id number] Primary Cardiologist: Meda Coffee  HPI: Debra Holloway is a 61 year old woman with h/o ADD, LBBB and depression. She was admitted to the hospital on 11/14/2014 with ADHF.  Echocardiogram demonstrated LVEF15-20%. Underwent LHC which showed normal coronaries.    She underwent implantation of Medtronic CRT-D by Dr. Caryl Comes in November 2016.  Today she returns for 6 month HF follow up. Overall feeling. Denies orthopnea/pnd. Mild dyspnea with steps. Able to walk around the grocery store. Weight at home 217 pounds. Taking lasix about 1-2 times a month. Not exercising. Taking all medication.   01/20/2015: CMRI- EF 29% RVEF 51% + dyssynchrony. No scar or infiltrative disease.  ICD interrogated today: Activity 3 hours per day. Fluid well below threshold. No AT/AF.   Echo (6/16):  EF ~30% with significant dyssynchrony, moderate MR Echo (9/16):  EF 25-30%, Grade 1 DD ECHO 01/2016: EF 45-50%.    ROS: All systems negative except as listed in HPI, PMH and Problem List.  Past Medical History:  Diagnosis Date  . ADD (attention deficit disorder)   . AICD (automatic cardioverter/defibrillator) present 13-Sep-2015  . Anxiety    "just when my daddy died a few years ago" (2015/09/13)  . AR (allergic rhinitis)   . CHF (congestive heart failure) ([place]) dx'd 11/2014  . Chicken pox   . Depression    "just when my daddy died a few years ago" (Sep 13, 2015)  . Dyslipidemia   . Environmental allergies   . H/O vitamin D deficiency   . LBBB (left bundle branch block)    W/1st degree AV Block, Avoid Beta Blockers  . OSA (obstructive sleep apnea) 03/18/2015   Severe OSA with AHI 30/hr; "suppose to wear mask but don't" (September 13, 2015)  . Seasonal affective disorder ([place])     SH:  Social History   Social History  . Marital status: Divorced    Spouse name: N/A  . Number of children: N/A  . Years of education:  N/A   Occupational History  . Not on file.   Social History Main Topics  . Smoking status: Never Smoker  . Smokeless tobacco: Never Used  . Alcohol use 2.4 oz/week    4 Glasses of wine per week  . Drug use: No  . Sexual activity: Not Currently   Other Topics Concern  . Not on file   Social History Narrative  . No narrative on file    FH:  Family History  Problem Relation Age of Onset  . COPD Mother        Living  . Pancreatic cancer Father 22       Deceased  . Lung cancer Paternal Grandfather   . Heart disease Paternal Grandmother   . Hypertension Paternal Grandmother   . Alzheimer's disease Maternal Grandfather   . Arthritis/Rheumatoid Maternal Grandmother   . Heart disease Maternal Grandmother        Tachycardia  . Cancer Paternal Uncle   . Healthy Brother        x3  . Healthy Daughter        x3  . Breast cancer Sister 53     Current Outpatient Prescriptions  Medication Sig Dispense Refill  . acetaminophen (TYLENOL) 500 MG tablet Take 500 mg by mouth every 6 (six) hours as needed for moderate pain.    Marland Kitchen b complex vitamins tablet Take 1 tablet by mouth daily.    Marland Kitchen  buPROPion (WELLBUTRIN XL) 150 MG 24 hr tablet TAKE 1 TABLET(150 MG) BY MOUTH DAILY 30 tablet 3  . carvedilol (COREG) 12.5 MG tablet Take 1 tablet (12.5 mg total) by mouth 2 (two) times daily with a meal. 60 tablet 3  . fexofenadine (ALLEGRA) 180 MG tablet Take 1 tablet (180 mg total) by mouth daily.    . furosemide (LASIX) 20 MG tablet TAKE 1 TABLET(20 MG) BY MOUTH DAILY AS NEEDED 15 tablet 3  . lisdexamfetamine (VYVANSE) 40 MG capsule Take 1 capsule (40 mg total) by mouth every morning. 30 capsule 0  . lisinopril (PRINIVIL,ZESTRIL) 10 MG tablet TAKE 1 TABLET(10 MG) BY MOUTH AT BEDTIME 30 tablet 0  . mometasone (NASONEX) 50 MCG/ACT nasal spray Place 2 sprays into the nose daily. 17 g 12  . potassium chloride (K-DUR,KLOR-CON) 10 MEQ tablet Take 2 tablets (20 mEq total) by mouth as needed (when take  lasix). 30 tablet 3  . spironolactone (ALDACTONE) 25 MG tablet TAKE 1 TABLET(25 MG) BY MOUTH DAILY 30 tablet 6   No current facility-administered medications for this encounter.     Vitals:   05/24/17 1020  BP: 125/73  Pulse: 70  SpO2: 100%  Weight: 213 lb (96.6 kg)    PHYSICAL EXAM: General:  Well appearing. No resp difficulty HEENT: normal Neck: supple. no JVD. Carotids 2+ bilat; no bruits. No lymphadenopathy or thryomegaly appreciated. Cor: PMI nondisplaced. Regular rate & rhythm. No rubs, gallops or murmurs. Lungs: clear Abdomen: soft, nontender, nondistended. No hepatosplenomegaly. No bruits or masses. Good bowel sounds. Extremities: no cyanosis, clubbing, rash, edema Neuro: alert & orientedx3, cranial nerves grossly intact. moves all 4 extremities w/o difficulty. Affect pleasant    ASSESSMENT & PLAN:  1) Chronic systolic HF: NICM. EF 25-30% by echo in 9/16, stable from 6/16 of about 30%. Normal coronaries on cath. Possible viral CMP. cMRI in 4/16 did not show evidence for infiltrative disease.  Has Mectronic Optivol CRT-D .  ECHO from April 2017 with improved EF 45-50%.  --EF improved after CRT-D. Todays ECHO discussed and reviewed by Dr Haroldine Laws. EF normal.  -- Volume status stable.Check BMET  --Continue current dose of lisinopril, spiro, and carvedilol  2) LBBB: Chronic. S/p CRT-D 3) OSA: Mild. Unable to tolerate CPAP.  4) ADD and depression: Per PCP  Follow up 12  months.    Debra Clegg NP-C  10:16 AM  Patient seen and examined with Darrick Grinder, NP. We discussed all aspects of the encounter. I agree with the assessment and plan as stated above.   Echo reviewed personally today and EF 55% with great improvement from CRT. No s/s HF on exam. Continue current regimen.   Glori Bickers, MD  9:39 PM

## 2017-05-24 NOTE — Patient Instructions (Signed)
Labs today (will call for abnormal results, otherwise no news is good news)  Follow up in 1 year, please call us to schedule your appointment.

## 2017-05-24 NOTE — Progress Notes (Signed)
  Echocardiogram 2D Echocardiogram has been performed.  Debra Holloway 05/24/2017, 9:54 AM

## 2017-05-31 ENCOUNTER — Other Ambulatory Visit: Payer: Self-pay | Admitting: Internal Medicine

## 2017-06-01 ENCOUNTER — Other Ambulatory Visit (HOSPITAL_COMMUNITY): Payer: Self-pay | Admitting: Internal Medicine

## 2017-06-06 ENCOUNTER — Other Ambulatory Visit (HOSPITAL_COMMUNITY): Payer: Self-pay | Admitting: Cardiology

## 2017-06-06 MED ORDER — CARVEDILOL 12.5 MG PO TABS: 12.5000 mg | ORAL_TABLET | Freq: Two times a day (BID) | ORAL | 11 refills | Status: DC

## 2017-06-11 ENCOUNTER — Ambulatory Visit (INDEPENDENT_AMBULATORY_CARE_PROVIDER_SITE_OTHER): Payer: BLUE CROSS/BLUE SHIELD | Admitting: *Deleted

## 2017-06-11 DIAGNOSIS — I428 Other cardiomyopathies: Secondary | ICD-10-CM | POA: Diagnosis not present

## 2017-06-11 DIAGNOSIS — Z95810 Presence of automatic (implantable) cardiac defibrillator: Secondary | ICD-10-CM

## 2017-06-11 DIAGNOSIS — I5022 Chronic systolic (congestive) heart failure: Secondary | ICD-10-CM

## 2017-06-11 NOTE — Progress Notes (Signed)
Remote ICD transmission.   

## 2017-06-12 LAB — CUP PACEART REMOTE DEVICE CHECK
Battery Remaining Longevity: 73 mo
Battery Voltage: 2.99 V
Brady Statistic AP VP Percent: 0.25 %
Brady Statistic AP VS Percent: 0.02 %
Brady Statistic AS VS Percent: 0.41 %
Brady Statistic RA Percent Paced: 0.27 %
HighPow Impedance: 72 Ohm
Implantable Lead Implant Date: 20161114
Implantable Lead Implant Date: 20161114
Implantable Lead Location: 753858
Implantable Lead Location: 753859
Implantable Lead Location: 753860
Implantable Lead Model: 5076
Implantable Pulse Generator Implant Date: 20161114
Lead Channel Impedance Value: 342 Ohm
Lead Channel Impedance Value: 361 Ohm
Lead Channel Impedance Value: 418 Ohm
Lead Channel Impedance Value: 456 Ohm
Lead Channel Impedance Value: 475 Ohm
Lead Channel Impedance Value: 532 Ohm
Lead Channel Impedance Value: 589 Ohm
Lead Channel Impedance Value: 608 Ohm
Lead Channel Impedance Value: 779 Ohm
Lead Channel Impedance Value: 779 Ohm
Lead Channel Impedance Value: 817 Ohm
Lead Channel Pacing Threshold Amplitude: 0.625 V
Lead Channel Pacing Threshold Amplitude: 0.875 V
Lead Channel Pacing Threshold Pulse Width: 0.4 ms
Lead Channel Pacing Threshold Pulse Width: 0.4 ms
Lead Channel Sensing Intrinsic Amplitude: 15.125 mV
Lead Channel Sensing Intrinsic Amplitude: 2.625 mV
Lead Channel Sensing Intrinsic Amplitude: 2.625 mV
Lead Channel Setting Pacing Amplitude: 2 V
Lead Channel Setting Pacing Amplitude: 2.5 V
Lead Channel Setting Pacing Amplitude: 2.5 V
Lead Channel Setting Pacing Pulse Width: 0.4 ms
Lead Channel Setting Sensing Sensitivity: 0.45 mV
MDC IDC LEAD IMPLANT DT: 20161114
MDC IDC MSMT LEADCHNL LV IMPEDANCE VALUE: 361 Ohm
MDC IDC MSMT LEADCHNL LV IMPEDANCE VALUE: 399 Ohm
MDC IDC MSMT LEADCHNL RV PACING THRESHOLD AMPLITUDE: 0.625 V
MDC IDC MSMT LEADCHNL RV PACING THRESHOLD PULSEWIDTH: 0.4 ms
MDC IDC MSMT LEADCHNL RV SENSING INTR AMPL: 15.125 mV
MDC IDC SESS DTM: 20180910041703
MDC IDC SET LEADCHNL LV PACING PULSEWIDTH: 0.4 ms
MDC IDC STAT BRADY AS VP PERCENT: 99.32 %
MDC IDC STAT BRADY RV PERCENT PACED: 99.42 %

## 2017-06-13 ENCOUNTER — Encounter: Payer: Self-pay | Admitting: Cardiology

## 2017-06-26 ENCOUNTER — Ambulatory Visit (INDEPENDENT_AMBULATORY_CARE_PROVIDER_SITE_OTHER): Payer: PPO | Admitting: Physician Assistant

## 2017-06-26 ENCOUNTER — Encounter: Payer: Self-pay | Admitting: Physician Assistant

## 2017-06-26 VITALS — BP 100/62 | HR 76 | Temp 98.6°F | Resp 14 | Ht 66.0 in | Wt 215.0 lb

## 2017-06-26 DIAGNOSIS — Z0000 Encounter for general adult medical examination without abnormal findings: Secondary | ICD-10-CM

## 2017-06-26 DIAGNOSIS — F329 Major depressive disorder, single episode, unspecified: Secondary | ICD-10-CM

## 2017-06-26 DIAGNOSIS — F988 Other specified behavioral and emotional disorders with onset usually occurring in childhood and adolescence: Secondary | ICD-10-CM | POA: Diagnosis not present

## 2017-06-26 DIAGNOSIS — I428 Other cardiomyopathies: Secondary | ICD-10-CM | POA: Diagnosis not present

## 2017-06-26 DIAGNOSIS — I5022 Chronic systolic (congestive) heart failure: Secondary | ICD-10-CM | POA: Diagnosis not present

## 2017-06-26 DIAGNOSIS — E785 Hyperlipidemia, unspecified: Secondary | ICD-10-CM

## 2017-06-26 DIAGNOSIS — F32A Depression, unspecified: Secondary | ICD-10-CM

## 2017-06-26 DIAGNOSIS — F419 Anxiety disorder, unspecified: Secondary | ICD-10-CM

## 2017-06-26 MED ORDER — LISDEXAMFETAMINE DIMESYLATE 40 MG PO CAPS: 40.0000 mg | ORAL_CAPSULE | ORAL | 0 refills | Status: DC

## 2017-06-26 NOTE — Patient Instructions (Addendum)
Please schedule an appointment for fasting labs. I will call you with your results.  Please reconsider breast exam and/or mammogram.  Please reconsider the cologuard testing.  Please download the [employer] app on your phone to help with anxiety. Read through the Mindfulness book I have given you and attempt some of the exercises!  I want you out and walking daily to get some sunlight and fresh air.  I am working on getting the Vyvanse approved for you.    Preventive Care 40-64 Years, Female Preventive care refers to lifestyle choices and visits with your health care provider that can promote health and wellness. What does preventive care include?  A yearly physical exam. This is also called an annual well check.  Dental exams once or twice a year.  Routine eye exams. Ask your health care provider how often you should have your eyes checked.  Personal lifestyle choices, including: ? Daily care of your teeth and gums. ? Regular physical activity. ? Eating a healthy diet. ? Avoiding tobacco and drug use. ? Limiting alcohol use. ? Practicing safe sex. ? Taking low-dose aspirin daily starting at age 32. ? Taking vitamin and mineral supplements as recommended by your health care provider. What happens during an annual well check? The services and screenings done by your health care provider during your annual well check will depend on your age, overall health, lifestyle risk factors, and family history of disease. Counseling Your health care provider may ask you questions about your:  Alcohol use.  Tobacco use.  Drug use.  Emotional well-being.  Home and relationship well-being.  Sexual activity.  Eating habits.  Work and work Statistician.  Method of birth control.  Menstrual cycle.  Pregnancy history.  Screening You may have the following tests or measurements:  Height, weight, and BMI.  Blood pressure.  Lipid and cholesterol levels. These may be checked  every 5 years, or more frequently if you are over 41 years old.  Skin check.  Lung cancer screening. You may have this screening every year starting at age 41 if you have a 30-pack-year history of smoking and currently smoke or have quit within the past 15 years.  Fecal occult blood test (FOBT) of the stool. You may have this test every year starting at age 36.  Flexible sigmoidoscopy or colonoscopy. You may have a sigmoidoscopy every 5 years or a colonoscopy every 10 years starting at age 76.  Hepatitis C blood test.  Hepatitis B blood test.  Sexually transmitted disease (STD) testing.  Diabetes screening. This is done by checking your blood sugar (glucose) after you have not eaten for a while (fasting). You may have this done every 1-3 years.  Mammogram. This may be done every 1-2 years. Talk to your health care provider about when you should start having regular mammograms. This may depend on whether you have a family history of breast cancer.  BRCA-related cancer screening. This may be done if you have a family history of breast, ovarian, tubal, or peritoneal cancers.  Pelvic exam and Pap test. This may be done every 3 years starting at age 57. Starting at age 52, this may be done every 5 years if you have a Pap test in combination with an HPV test.  Bone density scan. This is done to screen for osteoporosis. You may have this scan if you are at high risk for osteoporosis.  Discuss your test results, treatment options, and if necessary, the need for more tests with your  health care provider. Vaccines Your health care provider may recommend certain vaccines, such as:  Influenza vaccine. This is recommended every year.  Tetanus, diphtheria, and acellular pertussis (Tdap, Td) vaccine. You may need a Td booster every 10 years.  Varicella vaccine. You may need this if you have not been vaccinated.  Zoster vaccine. You may need this after age 52.  Measles, mumps, and rubella (MMR)  vaccine. You may need at least one dose of MMR if you were born in 1957 or later. You may also need a second dose.  Pneumococcal 13-valent conjugate (PCV13) vaccine. You may need this if you have certain conditions and were not previously vaccinated.  Pneumococcal polysaccharide (PPSV23) vaccine. You may need one or two doses if you smoke cigarettes or if you have certain conditions.  Meningococcal vaccine. You may need this if you have certain conditions.  Hepatitis A vaccine. You may need this if you have certain conditions or if you travel or work in places where you may be exposed to hepatitis A.  Hepatitis B vaccine. You may need this if you have certain conditions or if you travel or work in places where you may be exposed to hepatitis B.  Haemophilus influenzae type b (Hib) vaccine. You may need this if you have certain conditions.  Talk to your health care provider about which screenings and vaccines you need and how often you need them. This information is not intended to replace advice given to you by your health care provider. Make sure you discuss any questions you have with your health care provider. Document Released: 10/15/2015 Document Revised: 06/07/2016 Document Reviewed: 07/20/2015 Elsevier Interactive Patient Education  2017 [employer].

## 2017-06-26 NOTE — Assessment & Plan Note (Signed)
Followed by Cardiology. Continue care as directed by specialist. Vitals are stable today.

## 2017-06-26 NOTE — Assessment & Plan Note (Signed)
Depression screen negative. Health Maintenance reviewed -- Declines flu shot, mammography, breast exam or colonoscopy AMA. Preventive schedule discussed and handout given in AVS. Will obtain fasting labs today.

## 2017-06-26 NOTE — Progress Notes (Signed)
Patient presents to clinic today for annual exam.  Patient is not fasting for labs.  Chronic Issues: ADD -- On Vyvanse 40 mg daily. Has been stable on this regimen for quite some time. Patient denies chest pain, palpitations, lightheadedness, dizziness, vision changes or frequent headaches.  Depression -- Is currently on Wellbutrin XL. Is taking as directed. Endorses doing well overall. Has had some down days 2/2 her daughter's diagnosis of breast cancer and recent mastectomy. Denies SI/HI.  CHF -- Followed by Heart Failure Clinic. Taking medications as directed. Patient denies chest pain, palpitations, lightheadedness, dizziness, vision changes or frequent headaches.  Health Maintenance: Immunizations -- Declines flu shot. Tetanus up-to-date. Colonoscopy -- Patient declines again. Declines cologuard or hemoccult.  Mammogram -- Patient declines mammography or breast exam despite family history of breast cancer PAP -- s/p hysterectomy  Past Medical History:  Diagnosis Date  . ADD (attention deficit disorder)   . AICD (automatic cardioverter/defibrillator) present 08-30-2015  . Anxiety    "just when my daddy died a few years ago" (August 30, 2015)  . AR (allergic rhinitis)   . CHF (congestive heart failure) ([place]) dx'd 11/2014  . Chicken pox   . Depression    "just when my daddy died a few years ago" (08-30-2015)  . Dyslipidemia   . Environmental allergies   . H/O vitamin D deficiency   . LBBB (left bundle branch block)    W/1st degree AV Block, Avoid Beta Blockers  . OSA (obstructive sleep apnea) 03/18/2015   Severe OSA with AHI 30/hr; "suppose to wear mask but don't" (08-30-15)  . Seasonal affective disorder [facility])     Past Surgical History:  Procedure Laterality Date  . CARDIAC CATHETERIZATION    . DILATION AND CURETTAGE OF UTERUS  2003  . EP IMPLANTABLE DEVICE N/A 2015/08/30   Procedure: BiV ICD Insertion CRT-D;  Surgeon: Deboraha Sprang, MD;  Location: [facility] CV LAB;   Service: Cardiovascular;  Laterality: N/A;  . LEFT AND RIGHT HEART CATHETERIZATION WITH CORONARY ANGIOGRAM N/A 11/17/2014   Procedure: LEFT AND RIGHT HEART CATHETERIZATION WITH CORONARY ANGIOGRAM;  Surgeon: Troy Sine, MD;  Location[facility] C[facility] LAB;  Service: Cardiovascular;  Laterality: N/A;  . TOTAL ABDOMINAL HYSTERECTOMY  2004  . TUBAL LIGATION    . WISDOM TOOTH EXTRACTION  1986    Current Outpatient Prescriptions on File Prior to Visit  Medication Sig Dispense Refill  . acetaminophen (TYLENOL) 500 MG tablet Take 500 mg by mouth every 6 (six) hours as needed for moderate pain.    Marland Kitchen b complex vitamins tablet Take 1 tablet by mouth daily.    Marland Kitchen buPROPion (WELLBUTRIN XL) 150 MG 24 hr tablet TAKE 1 TABLET(150 MG) BY MOUTH DAILY 30 tablet 3  . carvedilol (COREG) 12.5 MG tablet Take 1 tablet (12.5 mg total) by mouth 2 (two) times daily with a meal. 60 tablet 11  . fexofenadine (ALLEGRA) 180 MG tablet Take 1 tablet (180 mg total) by mouth daily.    . furosemide (LASIX) 20 MG tablet TAKE 1 TABLET(20 MG) BY MOUTH DAILY AS NEEDED 15 tablet 3  . lisinopril (PRINIVIL,ZESTRIL) 10 MG tablet TAKE 1 TABLET(10 MG) BY MOUTH AT BEDTIME 30 tablet 11  . mometasone (NASONEX) 50 MCG/ACT nasal spray Place 2 sprays into the nose daily. 17 g 12  . potassium chloride (K-DUR,KLOR-CON) 10 MEQ tablet Take 2 tablets (20 mEq total) by mouth as needed (when take lasix). 30 tablet 3  . spironolactone (ALDACTONE) 25 MG tablet TAKE 1  TABLET(25 MG) BY MOUTH DAILY 30 tablet 6   No current facility-administered medications on file prior to visit.     Allergies  Allergen Reactions  . Bee Venom Anaphylaxis  . Erythromycin Itching and Rash  . Lovastatin Other (See Comments)    weakness  . Penicillins Hives, Itching and Rash  . Simvastatin Other (See Comments)    weakness    Family History  Problem Relation Age of Onset  . COPD Mother        Living  . Pancreatic cancer Father 54       Deceased  . Lung cancer  Paternal Grandfather   . Heart disease Paternal Grandmother   . Hypertension Paternal Grandmother   . Alzheimer's disease Maternal Grandfather   . Arthritis/Rheumatoid Maternal Grandmother   . Heart disease Maternal Grandmother        Tachycardia  . Cancer Paternal Uncle   . Healthy Brother        x3  . Healthy Daughter        x3  . Breast cancer Sister 60    Social History   Social History  . Marital status: Divorced    Spouse name: N/A  . Number of children: N/A  . Years of education: N/A   Occupational History  . Not on file.   Social History Main Topics  . Smoking status: Never Smoker  . Smokeless tobacco: Never Used  . Alcohol use 2.4 oz/week    4 Glasses of wine per week  . Drug use: No  . Sexual activity: Not Currently   Other Topics Concern  . Not on file   Social History Narrative  . No narrative on file   Review of Systems  Constitutional: Negative for fever and weight loss.  HENT: Negative for ear discharge, ear pain, hearing loss and tinnitus.   Eyes: Negative for blurred vision, double vision, photophobia and pain.  Respiratory: Negative for cough and shortness of breath.   Cardiovascular: Negative for chest pain and palpitations.  Gastrointestinal: Negative for abdominal pain, blood in stool, constipation, diarrhea, heartburn, melena, nausea and vomiting.  Genitourinary: Negative for dysuria, flank pain, frequency, hematuria and urgency.  Musculoskeletal: Negative for falls.  Neurological: Negative for dizziness, loss of consciousness and headaches.  Endo/Heme/Allergies: Negative for environmental allergies.  Psychiatric/Behavioral: Negative for depression, hallucinations, substance abuse and suicidal ideas. The patient is not nervous/anxious and does not have insomnia.    BP 100/62   Pulse 76   Temp 98.6 F (37 C) (Oral)   Resp 14   Ht 5' 6" (1.676 m)   Wt 215 lb (97.5 kg)   SpO2 99%   BMI 34.70 kg/m   Physical Exam  Constitutional: She  is oriented to person, place, and time and well-developed, well-nourished, and in no distress.  HENT:  Head: Normocephalic and atraumatic.  Eyes: Conjunctivae are normal.  Neck: Neck supple. No thyromegaly present.  Cardiovascular: Normal rate, regular rhythm, normal heart sounds and intact distal pulses.   Pulmonary/Chest: Effort normal and breath sounds normal. No respiratory distress. She has no wheezes. She has no rales. She exhibits no tenderness.  Neurological: She is alert and oriented to person, place, and time.  Skin: Skin is warm and dry. No rash noted.  Vitals reviewed.   Recent Results (from the past 2160 hour(s))  Basic Metabolic Panel (BMET)     Status: Abnormal   Collection Time: 05/24/17 10:54 AM  Result Value Ref Range   Sodium 138 135 -  145 mmol/L   Potassium 4.4 3.5 - 5.1 mmol/L   Chloride 103 101 - 111 mmol/L   CO2 28 22 - 32 mmol/L   Glucose, Bld 94 65 - 99 mg/dL   BUN 18 6 - 20 mg/dL   Creatinine, Ser 1.20 (H) 0.44 - 1.00 mg/dL   Calcium 9.8 8.9 - 10.3 mg/dL   GFR calc non Af Amer 48 (L) >60 mL/min   GFR calc Af Amer 56 (L) >60 mL/min    Comment: (NOTE) The eGFR has been calculated using the CKD EPI equation. This calculation has not been validated in all clinical situations. eGFR's persistently <60 mL/min signify possible Chronic Kidney Disease.    Anion gap 7 5 - 15  CUP PACEART REMOTE DEVICE CHECK     Status: None   Collection Time: 06/11/17  4:17 AM  Result Value Ref Range   Date Time Interrogation Session [id number]    Pulse Generator Manufacturer MERM    Pulse Gen Model [id number] Viva Quad XT CRT-D    Pulse Gen Serial Number [id number]    Clinic Name [facility] Pulse Generator Type Cardiac Resynch Therapy Defibulator    Implantable Pulse Generator Implant Date [id number]    Implantable Lead Manufacturer MERM    Implantable Lead Model 4598 Attain Performa S MRI SureScan    Implantable Lead Serial Number [id number]     Implantable Lead Implant Date [id number]    Implantable Lead Location Detail 1 UNKNOWN    Implantable Lead Location [id number]    Implantable Lead Manufacturer MERM    Implantable Lead Model 5076 CapSureFix Novus    Implantable Lead Serial Number [id number]    Implantable Lead Implant Date 88280034    Implantable Lead Location Detail 1 APPENDAGE    Implantable Lead Location [id number]    Implantable Lead Manufacturer [facility]    Implantable Lead Model [id number] Sprint Quattro Secure S    Implantable Lead Serial Number [id number]    Implantable Lead Implant Date 50569794    Implantable Lead Location Detail 1 APEX    Implantable Lead Location [id number]    Lead Channel Setting Sensing Sensitivity 0.45 mV   Lead Channel Setting Pacing Amplitude 2 V   Lead Channel Setting Pacing Pulse Width 0.4 ms   Lead Channel Setting Pacing Amplitude 2.5 V   Lead Channel Setting Pacing Pulse Width 0.4 ms   Lead Channel Setting Pacing Amplitude 2.5 V   Lead Channel Setting Pacing Capture Mode Adaptive Capture    Lead Channel Impedance Value 456 ohm   Lead Channel Sensing Intrinsic Amplitude 2.625 mV   Lead Channel Sensing Intrinsic Amplitude 2.625 mV   Lead Channel Pacing Threshold Amplitude 0.625 V   Lead Channel Pacing Threshold Pulse Width 0.4 ms   Lead Channel Impedance Value 418 ohm   Lead Channel Impedance Value 361 ohm   Lead Channel Sensing Intrinsic Amplitude 15.125 mV   Lead Channel Sensing Intrinsic Amplitude 15.125 mV   Lead Channel Pacing Threshold Amplitude 0.625 V   Lead Channel Pacing Threshold Pulse Width 0.4 ms   HighPow Impedance 72 ohm   Lead Channel Impedance Value 779 ohm   Lead Channel Impedance Value 817 ohm   Lead Channel Impedance Value 779 ohm   Lead Channel Impedance Value 475 ohm   Lead Channel Impedance Value 589 ohm   Lead Channel Impedance Value 608 ohm   Lead Channel Impedance Value 532 ohm   Lead Channel Impedance Value 361 ohm   Lead Channel  Impedance Value 399 ohm   Lead Channel  Impedance Value 342 ohm   Lead Channel Pacing Threshold Amplitude 0.875 V   Lead Channel Pacing Threshold Pulse Width 0.4 ms   Battery Status OK    Battery Remaining Longevity 73 mo   Battery Voltage 2.99 V   Brady Statistic RA Percent Paced 0.27 %   Brady Statistic RV Percent Paced 99.42 %   Brady Statistic AP VP Percent 0.25 %   Brady Statistic AS VP Percent 99.32 %   Brady Statistic AP VS Percent 0.02 %   Brady Statistic AS VS Percent 0.41 %   Eval Rhythm AsBp     Assessment/Plan: NICM (nonischemic cardiomyopathy) ([place]) Followed by Cardiology. Continue care as directed by specialist. Vitals are stable today.  Chronic systolic heart failure (HCC) Stable. Patient euvolemic on examination. Follow-up with Cardiology as scheduled.  Visit for preventive health examination Depression screen negative. Health Maintenance reviewed -- Declines flu shot, mammography, breast exam or colonoscopy AMA. Preventive schedule discussed and handout given in AVS. Will obtain fasting labs today.   Dyslipidemia Repeat fasting lipids.   Anxiety and depression Doing well overall. Discussed mindfulness training. Recommended mindfulness training. Book given.  ADD (attention deficit disorder) Stable. Doing well. Continue current regimen.     Leeanne Rio, PA-C

## 2017-06-26 NOTE — Assessment & Plan Note (Signed)
Doing well overall. Discussed mindfulness training. Recommended mindfulness training. Book given.

## 2017-06-26 NOTE — Assessment & Plan Note (Signed)
Stable. Doing well. Continue current regimen.

## 2017-06-26 NOTE — Assessment & Plan Note (Signed)
Stable. Patient euvolemic on examination. Follow-up with Cardiology as scheduled.

## 2017-06-26 NOTE — Progress Notes (Signed)
Pre visit review using our clinic review tool, if applicable. No additional management support is needed unless otherwise documented below in the visit note. 

## 2017-06-26 NOTE — Assessment & Plan Note (Signed)
Repeat fasting lipids.

## 2017-06-28 ENCOUNTER — Telehealth: Payer: Self-pay | Admitting: Emergency Medicine

## 2017-06-28 NOTE — Telephone Encounter (Signed)
PA started on Vyvanse medication for patient. PA approved until 10/01/17 for Vyvanse thru [employer]. Pharmacy and patient notified of approval.

## 2017-07-02 ENCOUNTER — Other Ambulatory Visit: Payer: PPO

## 2017-07-06 ENCOUNTER — Telehealth: Payer: Self-pay | Admitting: General Practice

## 2017-07-06 DIAGNOSIS — I509 Heart failure, unspecified: Secondary | ICD-10-CM

## 2017-07-06 NOTE — Telephone Encounter (Signed)
Pt. Is scheduled for 07/09/17 at [place] for labs

## 2017-07-06 NOTE — Telephone Encounter (Signed)
Pt called in and advised that she had missed her lab appointment on Monday. She advised that Einar Pheasant had completed a CPE on her. I did not see future labs in her chart. Please advise?

## 2017-07-06 NOTE — Telephone Encounter (Signed)
Orders placed. She can schedule and have done.

## 2017-07-09 ENCOUNTER — Other Ambulatory Visit (INDEPENDENT_AMBULATORY_CARE_PROVIDER_SITE_OTHER): Payer: PPO

## 2017-07-09 DIAGNOSIS — Z0000 Encounter for general adult medical examination without abnormal findings: Secondary | ICD-10-CM

## 2017-07-09 LAB — CBC WITH DIFFERENTIAL/PLATELET
BASOS ABS: 0.1 10*3/uL (ref 0.0–0.1)
Basophils Relative: 1 % (ref 0.0–3.0)
Eosinophils Absolute: 0.1 10*3/uL (ref 0.0–0.7)
Eosinophils Relative: 0.9 % (ref 0.0–5.0)
HEMATOCRIT: 37.5 % (ref 36.0–46.0)
Hemoglobin: 12.2 g/dL (ref 12.0–15.0)
Lymphocytes Relative: 28 % (ref 12.0–46.0)
Lymphs Abs: 2.1 10*3/uL (ref 0.7–4.0)
MCHC: 32.5 g/dL (ref 30.0–36.0)
MCV: 94.3 fl (ref 78.0–100.0)
MONOS PCT: 7.7 % (ref 3.0–12.0)
Monocytes Absolute: 0.6 10*3/uL (ref 0.1–1.0)
Neutro Abs: 4.8 10*3/uL (ref 1.4–7.7)
Neutrophils Relative %: 62.4 % (ref 43.0–77.0)
Platelets: 234 10*3/uL (ref 150.0–400.0)
RBC: 3.98 Mil/uL (ref 3.87–5.11)
RDW: 13.6 % (ref 11.5–15.5)
WBC: 7.6 10*3/uL (ref 4.0–10.5)

## 2017-07-09 LAB — COMPREHENSIVE METABOLIC PANEL
ALT: 15 U/L (ref 0–35)
AST: 14 U/L (ref 0–37)
Albumin: 3.7 g/dL (ref 3.5–5.2)
Alkaline Phosphatase: 56 U/L (ref 39–117)
BILIRUBIN TOTAL: 0.5 mg/dL (ref 0.2–1.2)
BUN: 15 mg/dL (ref 6–23)
CO2: 26 meq/L (ref 19–32)
Calcium: 9.1 mg/dL (ref 8.4–10.5)
Chloride: 107 mEq/L (ref 96–112)
Creatinine, Ser: 0.92 mg/dL (ref 0.40–1.20)
GFR: 66.23 mL/min (ref >60.00)
Glucose, Bld: 86 mg/dL (ref 70–99)
Potassium: 4.2 mEq/L (ref 3.5–5.1)
Sodium: 139 mEq/L (ref 135–145)
TOTAL PROTEIN: 6.3 g/dL (ref 6.0–8.3)

## 2017-07-09 LAB — LIPID PANEL
CHOL/HDL RATIO: 3
Cholesterol: 204 mg/dL — ABNORMAL HIGH (ref 0–200)
HDL: 65.4 mg/dL (ref >39.00)
LDL Cholesterol: 120 mg/dL — ABNORMAL HIGH (ref 0–99)
NonHDL: 138.39
TRIGLYCERIDES: 90 mg/dL (ref 0.0–149.0)
VLDL: 18 mg/dL (ref 0.0–40.0)

## 2017-07-09 LAB — HEMOGLOBIN A1C: Hgb A1c MFr Bld: 5.5 % (ref 4.6–6.5)

## 2017-07-09 LAB — TSH: TSH: 3.05 u[IU]/mL (ref 0.35–4.50)

## 2017-07-13 ENCOUNTER — Encounter: Payer: Self-pay | Admitting: Emergency Medicine

## 2017-09-10 ENCOUNTER — Encounter: Payer: PPO | Admitting: *Deleted

## 2017-09-12 ENCOUNTER — Ambulatory Visit: Payer: PPO | Admitting: Physician Assistant

## 2017-09-12 ENCOUNTER — Encounter: Payer: Self-pay | Admitting: Physician Assistant

## 2017-09-12 ENCOUNTER — Other Ambulatory Visit: Payer: Self-pay

## 2017-09-12 ENCOUNTER — Encounter: Payer: PPO | Admitting: Internal Medicine

## 2017-09-12 VITALS — BP 120/82 | HR 71 | Temp 98.4°F | Resp 16 | Ht 66.0 in | Wt 217.0 lb

## 2017-09-12 DIAGNOSIS — R05 Cough: Secondary | ICD-10-CM

## 2017-09-12 DIAGNOSIS — R058 Other specified cough: Secondary | ICD-10-CM

## 2017-09-12 MED ORDER — LISDEXAMFETAMINE DIMESYLATE 40 MG PO CAPS: 40.0000 mg | ORAL_CAPSULE | ORAL | 0 refills | Status: DC

## 2017-09-12 MED ORDER — BENZONATATE 100 MG PO CAPS: 100.0000 mg | ORAL_CAPSULE | Freq: Three times a day (TID) | ORAL | 0 refills | Status: DC | PRN

## 2017-09-12 MED ORDER — LISDEXAMFETAMINE DIMESYLATE 40 MG PO CAPS
40.0000 mg | ORAL_CAPSULE | ORAL | 0 refills | Status: DC
Start: 2017-09-12 — End: 2018-01-04

## 2017-09-12 NOTE — Patient Instructions (Addendum)
Please stay well-hydrated and get plenty of rest. Start saline nasal rinse and restart your nasonex. Make sure to take your Allegra as directed. Continue plain Mucinex. Start the Viola as directed. Get some hard candy to suck on to help calm down the cough reflex.  Symptoms should continue to improve.  I have refilled your Vyvanse. I am glad you are doing well otherwise.

## 2017-09-12 NOTE — Progress Notes (Signed)
Patient presents to clinic today c/o 3 weeks of a dry cough following a week of sinus congestion, headache and ear pain. States all of her sinus symptoms dissipated after a week or so but has been left with a nagging cough. Denies chest pain or SOB. Noted mild wheeze during head cold that has also resolved. Denies fever or chills. Has noted slight fatigue.  Past Medical History:  Diagnosis Date  . ADD (attention deficit disorder)   . AICD (automatic cardioverter/defibrillator) present 09-15-15  . Anxiety    "just when my daddy died a few years ago" (September 15, 2015)  . AR (allergic rhinitis)   . CHF (congestive heart failure) ([place]) dx'd 11/2014  . Chicken pox   . Depression    "just when my daddy died a few years ago" (2015-09-15)  . Dyslipidemia   . Environmental allergies   . H/O vitamin D deficiency   . LBBB (left bundle branch block)    W/1st degree AV Block, Avoid Beta Blockers  . OSA (obstructive sleep apnea) 03/18/2015   Severe OSA with AHI 30/hr; "suppose to wear mask but don't" (15-Sep-2015)  . Seasonal affective disorder [facility])     Current Outpatient Medications on File Prior to Visit  Medication Sig Dispense Refill  . acetaminophen (TYLENOL) 500 MG tablet Take 500 mg by mouth every 6 (six) hours as needed for moderate pain.    Marland Kitchen b complex vitamins tablet Take 1 tablet by mouth daily.    Marland Kitchen buPROPion (WELLBUTRIN XL) 150 MG 24 hr tablet TAKE 1 TABLET(150 MG) BY MOUTH DAILY 30 tablet 3  . carvedilol (COREG) 12.5 MG tablet Take 1 tablet (12.5 mg total) by mouth 2 (two) times daily with a meal. 60 tablet 11  . fexofenadine (ALLEGRA) 180 MG tablet Take 1 tablet (180 mg total) by mouth daily.    . furosemide (LASIX) 20 MG tablet TAKE 1 TABLET(20 MG) BY MOUTH DAILY AS NEEDED 15 tablet 3  . lisdexamfetamine (VYVANSE) 40 MG capsule Take 1 capsule (40 mg total) by mouth every morning. 30 capsule 0  . lisinopril (PRINIVIL,ZESTRIL) 10 MG tablet TAKE 1 TABLET(10 MG) BY MOUTH AT BEDTIME 30  tablet 11  . mometasone (NASONEX) 50 MCG/ACT nasal spray Place 2 sprays into the nose daily. 17 g 12  . potassium chloride (K-DUR,KLOR-CON) 10 MEQ tablet Take 2 tablets (20 mEq total) by mouth as needed (when take lasix). 30 tablet 3  . spironolactone (ALDACTONE) 25 MG tablet TAKE 1 TABLET(25 MG) BY MOUTH DAILY 30 tablet 6   No current facility-administered medications on file prior to visit.     Allergies  Allergen Reactions  . Bee Venom Anaphylaxis  . Erythromycin Itching and Rash  . Lovastatin Other (See Comments)    weakness  . Penicillins Hives, Itching and Rash  . Simvastatin Other (See Comments)    weakness    Family History  Problem Relation Age of Onset  . COPD Mother        Living  . Pancreatic cancer Father 62       Deceased  . Lung cancer Paternal Grandfather   . Heart disease Paternal Grandmother   . Hypertension Paternal Grandmother   . Alzheimer's disease Maternal Grandfather   . Arthritis/Rheumatoid Maternal Grandmother   . Heart disease Maternal Grandmother        Tachycardia  . Cancer Paternal Uncle   . Healthy Brother        x3  . Healthy Daughter  x3  . Breast cancer Sister 16    Social History   Socioeconomic History  . Marital status: Divorced    Spouse name: None  . Number of children: None  . Years of education: None  . Highest education level: None  Social Needs  . Financial resource strain: None  . Food insecurity - worry: None  . Food insecurity - inability: None  . Transportation needs - medical: None  . Transportation needs - non-medical: None  Occupational History  . None  Tobacco Use  . Smoking status: Never Smoker  . Smokeless tobacco: Never Used  Substance and Sexual Activity  . Alcohol use: Yes    Alcohol/week: 2.4 oz    Types: 4 Glasses of wine per week  . Drug use: No  . Sexual activity: Not Currently  Other Topics Concern  . None  Social History Narrative  . None   Review of Systems - See HPI.  All other  ROS are negative.  BP 120/82   Pulse 71   Temp 98.4 F (36.9 C) (Oral)   Resp 16   Ht 5' 6"  (1.676 m)   Wt 217 lb (98.4 kg)   SpO2 98%   BMI 35.02 kg/m   Physical Exam  Constitutional: She is oriented to person, place, and time and well-developed, well-nourished, and in no distress.  HENT:  Head: Normocephalic and atraumatic.  Right Ear: External ear normal.  Left Ear: External ear normal. A middle ear effusion (slight serous fluid build up noted.) is present.  Nose: Rhinorrhea present. No mucosal edema. Right sinus exhibits no maxillary sinus tenderness and no frontal sinus tenderness. Left sinus exhibits no maxillary sinus tenderness and no frontal sinus tenderness.  Mouth/Throat: Oropharynx is clear and moist. No oropharyngeal exudate.  Eyes: Conjunctivae are normal.  Neck: Neck supple.  Cardiovascular: Normal rate, regular rhythm, normal heart sounds and intact distal pulses.  Pulmonary/Chest: Effort normal and breath sounds normal. No respiratory distress. She has no wheezes. She has no rales. She exhibits no tenderness.  Lymphadenopathy:    She has no cervical adenopathy.  Neurological: She is alert and oriented to person, place, and time.  Skin: Skin is warm and dry. No rash noted.  Psychiatric: Affect normal.  Vitals reviewed.  Recent Results (from the past 2160 hour(s))  TSH     Status: None   Collection Time: 07/09/17  8:58 AM  Result Value Ref Range   TSH 3.05 0.35 - 4.50 uIU/mL  HgB A1c     Status: None   Collection Time: 07/09/17  8:58 AM  Result Value Ref Range   Hgb A1c MFr Bld 5.5 4.6 - 6.5 %    Comment: Glycemic Control Guidelines for People with Diabetes:Non Diabetic:  <6%Goal of Therapy: <7%Additional Action Suggested:  >8%   Lipid panel     Status: Abnormal   Collection Time: 07/09/17  8:58 AM  Result Value Ref Range   Cholesterol 204 (H) 0 - 200 mg/dL    Comment: ATP III Classification       Desirable:  < 200 mg/dL               Borderline High:  200  - 239 mg/dL          High:  > = 240 mg/dL   Triglycerides 90.0 0.0 - 149.0 mg/dL    Comment: Normal:  <150 mg/dLBorderline High:  150 - 199 mg/dL   HDL 65.40 >39.00 mg/dL   VLDL 18.0 0.0 -  40.0 mg/dL   LDL Cholesterol 120 (H) 0 - 99 mg/dL   Total CHOL/HDL Ratio 3     Comment:                Men          Women1/2 Average Risk     3.4          3.3Average Risk          5.0          4.42X Average Risk          9.6          7.13X Average Risk          15.0          11.0                       NonHDL 138.39     Comment: NOTE:  Non-HDL goal should be 30 mg/dL higher than patient's LDL goal (i.e. LDL goal of < 70 mg/dL, would have non-HDL goal of < 100 mg/dL)  Comp Met (CMET)     Status: None   Collection Time: 07/09/17  8:58 AM  Result Value Ref Range   Sodium 139 135 - 145 mEq/L   Potassium 4.2 3.5 - 5.1 mEq/L   Chloride 107 96 - 112 mEq/L   CO2 26 19 - 32 mEq/L   Glucose, Bld 86 70 - 99 mg/dL   BUN 15 6 - 23 mg/dL   Creatinine, Ser 0.92 0.40 - 1.20 mg/dL   Total Bilirubin 0.5 0.2 - 1.2 mg/dL   Alkaline Phosphatase 56 39 - 117 U/L   AST 14 0 - 37 U/L   ALT 15 0 - 35 U/L   Total Protein 6.3 6.0 - 8.3 g/dL   Albumin 3.7 3.5 - 5.2 g/dL   Calcium 9.1 8.4 - 10.5 mg/dL   GFR 66.23 >60.00 mL/min  CBC w/Diff     Status: None   Collection Time: 07/09/17  8:58 AM  Result Value Ref Range   WBC 7.6 4.0 - 10.5 K/uL   RBC 3.98 3.87 - 5.11 Mil/uL   Hemoglobin 12.2 12.0 - 15.0 g/dL   HCT 37.5 36.0 - 46.0 %   MCV 94.3 78.0 - 100.0 fl   MCHC 32.5 30.0 - 36.0 g/dL   RDW 13.6 11.5 - 15.5 %   Platelets 234.0 150.0 - 400.0 K/uL   Neutrophils Relative % 62.4 43.0 - 77.0 %   Lymphocytes Relative 28.0 12.0 - 46.0 %   Monocytes Relative 7.7 3.0 - 12.0 %   Eosinophils Relative 0.9 0.0 - 5.0 %   Basophils Relative 1.0 0.0 - 3.0 %   Neutro Abs 4.8 1.4 - 7.7 K/uL   Lymphs Abs 2.1 0.7 - 4.0 K/uL   Monocytes Absolute 0.6 0.1 - 1.0 K/uL   Eosinophils Absolute 0.1 0.0 - 0.7 K/uL   Basophils Absolute 0.1 0.0 -  0.1 K/uL    Assessment/Plan: 1. Post-viral cough syndrome Lungs CTAB. Exam negative except for mild serous fluid behind L TM and rhinorrhea. Patient to restart her Allegra and Nasonex. Start Tessalon for cough. Increased hydration and rest recommended. Follow-up if not improving.   Leeanne Rio, PA-C

## 2017-09-14 ENCOUNTER — Encounter: Payer: Self-pay | Admitting: Cardiology

## 2017-09-21 ENCOUNTER — Ambulatory Visit (INDEPENDENT_AMBULATORY_CARE_PROVIDER_SITE_OTHER): Payer: PPO | Admitting: *Deleted

## 2017-09-21 DIAGNOSIS — I5022 Chronic systolic (congestive) heart failure: Secondary | ICD-10-CM

## 2017-09-21 DIAGNOSIS — I428 Other cardiomyopathies: Secondary | ICD-10-CM | POA: Diagnosis not present

## 2017-09-26 ENCOUNTER — Encounter: Payer: Self-pay | Admitting: Cardiology

## 2017-10-04 LAB — CUP PACEART REMOTE DEVICE CHECK
Battery Voltage: 2.95 V
Brady Statistic AP VP Percent: 0.12 %
Brady Statistic AS VP Percent: 99.41 %
Brady Statistic AS VS Percent: 0.45 %
Brady Statistic RA Percent Paced: 0.15 %
Brady Statistic RV Percent Paced: 99.41 %
Date Time Interrogation Session: 20181222020935
HighPow Impedance: 78 Ohm
Implantable Lead Implant Date: 20161114
Implantable Lead Location: 753859
Implantable Lead Location: 753860
Implantable Lead Model: 4598
Implantable Lead Model: 5076
Implantable Pulse Generator Implant Date: 20161114
Lead Channel Impedance Value: 361 Ohm
Lead Channel Impedance Value: 361 Ohm
Lead Channel Impedance Value: 361 Ohm
Lead Channel Impedance Value: 399 Ohm
Lead Channel Impedance Value: 418 Ohm
Lead Channel Impedance Value: 532 Ohm
Lead Channel Impedance Value: 589 Ohm
Lead Channel Impedance Value: 589 Ohm
Lead Channel Impedance Value: 779 Ohm
Lead Channel Impedance Value: 817 Ohm
Lead Channel Pacing Threshold Amplitude: 0.625 V
Lead Channel Pacing Threshold Amplitude: 0.625 V
Lead Channel Pacing Threshold Amplitude: 0.875 V
Lead Channel Pacing Threshold Pulse Width: 0.4 ms
Lead Channel Pacing Threshold Pulse Width: 0.4 ms
Lead Channel Sensing Intrinsic Amplitude: 17.375 mV
Lead Channel Sensing Intrinsic Amplitude: 2.75 mV
Lead Channel Sensing Intrinsic Amplitude: 2.75 mV
Lead Channel Setting Pacing Amplitude: 2 V
Lead Channel Setting Pacing Amplitude: 2.5 V
Lead Channel Setting Pacing Amplitude: 2.5 V
Lead Channel Setting Sensing Sensitivity: 0.45 mV
MDC IDC LEAD IMPLANT DT: 20161114
MDC IDC LEAD IMPLANT DT: 20161114
MDC IDC LEAD LOCATION: 753858
MDC IDC MSMT BATTERY REMAINING LONGEVITY: 69 mo
MDC IDC MSMT LEADCHNL LV IMPEDANCE VALUE: 513 Ohm
MDC IDC MSMT LEADCHNL LV IMPEDANCE VALUE: 779 Ohm
MDC IDC MSMT LEADCHNL RA PACING THRESHOLD PULSEWIDTH: 0.4 ms
MDC IDC MSMT LEADCHNL RV IMPEDANCE VALUE: 456 Ohm
MDC IDC MSMT LEADCHNL RV SENSING INTR AMPL: 17.375 mV
MDC IDC SET LEADCHNL LV PACING PULSEWIDTH: 0.4 ms
MDC IDC SET LEADCHNL RV PACING PULSEWIDTH: 0.4 ms
MDC IDC STAT BRADY AP VS PERCENT: 0.02 %

## 2017-10-19 ENCOUNTER — Other Ambulatory Visit (HOSPITAL_COMMUNITY): Payer: Self-pay | Admitting: Internal Medicine

## 2017-11-09 ENCOUNTER — Encounter: Payer: Self-pay | Admitting: Internal Medicine

## 2017-11-09 ENCOUNTER — Ambulatory Visit: Payer: Medicare Other | Admitting: Internal Medicine

## 2017-11-09 VITALS — BP 110/62 | HR 76 | Ht 66.0 in | Wt 214.0 lb

## 2017-11-09 DIAGNOSIS — I5022 Chronic systolic (congestive) heart failure: Secondary | ICD-10-CM | POA: Diagnosis not present

## 2017-11-09 DIAGNOSIS — Z95810 Presence of automatic (implantable) cardiac defibrillator: Secondary | ICD-10-CM

## 2017-11-09 DIAGNOSIS — I428 Other cardiomyopathies: Secondary | ICD-10-CM

## 2017-11-09 DIAGNOSIS — I447 Left bundle-branch block, unspecified: Secondary | ICD-10-CM

## 2017-11-09 LAB — CUP PACEART INCLINIC DEVICE CHECK
Battery Remaining Longevity: 67 mo
Battery Voltage: 2.97 V
Brady Statistic AP VS Percent: 0.02 %
Brady Statistic AS VP Percent: 99.36 %
Brady Statistic AS VS Percent: 0.4 %
Brady Statistic RA Percent Paced: 0.25 %
Date Time Interrogation Session: 20190208153531
HIGH POWER IMPEDANCE MEASURED VALUE: 77 Ohm
Implantable Lead Implant Date: 20161114
Implantable Lead Implant Date: 20161114
Implantable Lead Location: 753858
Implantable Lead Location: 753859
Implantable Pulse Generator Implant Date: 20161114
Lead Channel Impedance Value: 399 Ohm
Lead Channel Impedance Value: 399 Ohm
Lead Channel Impedance Value: 418 Ohm
Lead Channel Impedance Value: 456 Ohm
Lead Channel Impedance Value: 475 Ohm
Lead Channel Impedance Value: 513 Ohm
Lead Channel Impedance Value: 646 Ohm
Lead Channel Impedance Value: 646 Ohm
Lead Channel Impedance Value: 817 Ohm
Lead Channel Impedance Value: 817 Ohm
Lead Channel Pacing Threshold Amplitude: 0.625 V
Lead Channel Pacing Threshold Pulse Width: 0.4 ms
Lead Channel Pacing Threshold Pulse Width: 0.4 ms
Lead Channel Pacing Threshold Pulse Width: 0.4 ms
Lead Channel Sensing Intrinsic Amplitude: 21.375 mV
Lead Channel Sensing Intrinsic Amplitude: 26.625 mV
Lead Channel Setting Pacing Amplitude: 2 V
Lead Channel Setting Pacing Amplitude: 2.5 V
Lead Channel Setting Pacing Amplitude: 2.5 V
Lead Channel Setting Pacing Pulse Width: 0.4 ms
Lead Channel Setting Pacing Pulse Width: 0.4 ms
Lead Channel Setting Sensing Sensitivity: 0.45 mV
MDC IDC LEAD IMPLANT DT: 20161114
MDC IDC LEAD LOCATION: 753860
MDC IDC MSMT LEADCHNL LV IMPEDANCE VALUE: 361 Ohm
MDC IDC MSMT LEADCHNL LV IMPEDANCE VALUE: 551 Ohm
MDC IDC MSMT LEADCHNL LV IMPEDANCE VALUE: 779 Ohm
MDC IDC MSMT LEADCHNL LV PACING THRESHOLD AMPLITUDE: 0.875 V
MDC IDC MSMT LEADCHNL RA SENSING INTR AMPL: 1.875 mV
MDC IDC MSMT LEADCHNL RA SENSING INTR AMPL: 3.125 mV
MDC IDC MSMT LEADCHNL RV PACING THRESHOLD AMPLITUDE: 0.625 V
MDC IDC STAT BRADY AP VP PERCENT: 0.23 %
MDC IDC STAT BRADY RV PERCENT PACED: 99.46 %

## 2017-11-09 NOTE — Progress Notes (Signed)
Patient Care Team: Delorse Limber as PCP - General (Physician Assistant)   HPI  Debra Holloway is a 62 y.o. female Seen in follow-up for CRT-D-Medtronic implanted 11/16 for left bundle branch block with nonischemic cardiomyopathy.  She is managed with beta-blockers ACE inhibitors and ARB's  Has had an intercurrent URI with some dyspnea.  Has had some peripheral edema but she manages this with as needed diuretics effectively.  No orthopnea or nocturnal dyspnea    DATE TEST EF   4/16 cMRI  25-30 %   4/17 Echo   40-45 %   8/18 Echo  50-55%    Date Cr K  10/18 0.92 4.2           Past Medical History:  Diagnosis Date  . ADD (attention deficit disorder)   . AICD (automatic cardioverter/defibrillator) present 2015/09/13  . Anxiety    "just when my daddy died a few years ago" (2015/09/13)  . AR (allergic rhinitis)   . CHF (congestive heart failure) ([place]) dx'd 11/2014  . Chicken pox   . Depression    "just when my daddy died a few years ago" (09-13-15)  . Dyslipidemia   . Environmental allergies   . H/O vitamin D deficiency   . LBBB (left bundle branch block)    W/1st degree AV Block, Avoid Beta Blockers  . OSA (obstructive sleep apnea) 03/18/2015   Severe OSA with AHI 30/hr; "suppose to wear mask but don't" (Sep 13, 2015)  . Seasonal affective disorder [facility])     Past Surgical History:  Procedure Laterality Date  . CARDIAC CATHETERIZATION    . DILATION AND CURETTAGE OF UTERUS  2003  . EP IMPLANTABLE DEVICE N/A Sep 13, 2015   Procedure: BiV ICD Insertion CRT-D;  Surgeon: Deboraha Sprang, MD;  Location: [place] CV LAB;  Service: Cardiovascular;  Laterality: N/A;  . LEFT AND RIGHT HEART CATHETERIZATION WITH CORONARY ANGIOGRAM N/A 11/17/2014   Procedure: LEFT AND RIGHT HEART CATHETERIZATION WITH CORONARY ANGIOGRAM;  Surgeon: Troy Sine, MD;  [facility] C[facility] LAB;  Service: Cardiovascular;  Laterality: N/A;  . TOTAL ABDOMINAL HYSTERECTOMY  2004  . TUBAL  LIGATION    . WISDOM TOOTH EXTRACTION  1986    Current Outpatient Medications  Medication Sig Dispense Refill  . acetaminophen (TYLENOL) 500 MG tablet Take 500 mg by mouth every 6 (six) hours as needed for moderate pain.    Marland Kitchen b complex vitamins tablet Take 1 tablet by mouth daily.    Marland Kitchen buPROPion (WELLBUTRIN XL) 150 MG 24 hr tablet TAKE 1 TABLET(150 MG) BY MOUTH DAILY 30 tablet 3  . carvedilol (COREG) 12.5 MG tablet Take 1 tablet (12.5 mg total) by mouth 2 (two) times daily with a meal. 60 tablet 11  . Cholecalciferol (VITAMIN D PO) Take 1 tablet by mouth daily.    . fexofenadine (ALLEGRA) 180 MG tablet Take 1 tablet (180 mg total) by mouth daily.    . furosemide (LASIX) 20 MG tablet TAKE 1 TABLET(20 MG) BY MOUTH DAILY AS NEEDED 15 tablet 6  . lisdexamfetamine (VYVANSE) 40 MG capsule Take 1 capsule (40 mg total) by mouth every morning. 30 capsule 0  . lisinopril (PRINIVIL,ZESTRIL) 10 MG tablet TAKE 1 TABLET(10 MG) BY MOUTH AT BEDTIME 30 tablet 11  . mometasone (NASONEX) 50 MCG/ACT nasal spray Place 2 sprays into the nose daily. 17 g 12  . potassium chloride (K-DUR,KLOR-CON) 10 MEQ tablet TAKE 2 TABLETS( 20 MEQ) BY MOUTH AS NEEDED WHEN  TAKING LASIX( FUROSEMIDE) 30 tablet 6  . spironolactone (ALDACTONE) 25 MG tablet TAKE 1 TABLET(25 MG) BY MOUTH DAILY 30 tablet 6   No current facility-administered medications for this visit.     Allergies  Allergen Reactions  . Bee Venom Anaphylaxis  . Erythromycin Itching and Rash  . Lovastatin Other (See Comments)    weakness  . Penicillins Hives, Itching and Rash  . Simvastatin Other (See Comments)    weakness      Review of Systems negative except from HPI and PMH  Physical Exam BP 110/62   Pulse 76   Ht 5\' 6"  (1.676 m)   Wt 214 lb (97.1 kg)   BMI 34.54 kg/m  Well developed and nourished in no acute distress HENT normal Neck supple with JVP-flat Clear Device pocket well healed; without hematoma or erythema.  There is no  tethering Regular rate and rhythm, no murmurs or gallops Abd-soft with active BS No Clubbing cyanosis edema Skin-warm and dry A & Oriented  Grossly normal sensory and motor function     ECG demonstrates sinus with P-synchronous/ AV  pacing  QRS negative lead I negative lead V1 QRS duration 124  Assessment and  Plan  Nonischemic cardiomyopathy  Left bundle branch block  CRT-ICD Medtronic  No reprograaming necessary   Details in paceart   Congestive heart failure-chronic diastolic  Dyspnea on exertion  Anxiety   Dyspnea does not correlate with any evidence of volume overload based on her Optivol.  Having said that she is also had this URI for which she received no antibiotics.  This could have  an atypical pneumonia component which can be associated with significant dyspnea.  If her symptoms do not abate I suggested that she follow-up with her PCP  Euvolemic continue current meds

## 2017-11-09 NOTE — Patient Instructions (Addendum)
Medication Instructions:  Your physician recommends that you continue on your current medications as directed. Please refer to the Current Medication list given to you today.  Labwork: None ordered.  Testing/Procedures: None ordered.  Follow-Up: Your physician recommends that you schedule a follow-up appointment in: One year with Chanetta Marshall, PA  Any Other Special Instructions Will Be Listed Below (If Applicable).     If you need a refill on your cardiac medications before your next appointment, please call your pharmacy.

## 2017-12-26 ENCOUNTER — Ambulatory Visit (INDEPENDENT_AMBULATORY_CARE_PROVIDER_SITE_OTHER): Payer: Medicare Other | Admitting: *Deleted

## 2017-12-26 DIAGNOSIS — I5022 Chronic systolic (congestive) heart failure: Secondary | ICD-10-CM

## 2017-12-26 DIAGNOSIS — I428 Other cardiomyopathies: Secondary | ICD-10-CM | POA: Diagnosis not present

## 2017-12-26 NOTE — Progress Notes (Signed)
Remote ICD transmission.   

## 2017-12-28 LAB — CUP PACEART REMOTE DEVICE CHECK
Battery Remaining Longevity: 62 mo
Battery Voltage: 2.97 V
Brady Statistic AS VP Percent: 99.28 %
Brady Statistic AS VS Percent: 0.55 %
Brady Statistic RA Percent Paced: 0.17 %
Brady Statistic RV Percent Paced: 99.29 %
HighPow Impedance: 74 Ohm
Implantable Lead Implant Date: 20161114
Implantable Lead Location: 753858
Implantable Lead Location: 753859
Implantable Lead Model: 4598
Implantable Pulse Generator Implant Date: 20161114
Lead Channel Impedance Value: 361 Ohm
Lead Channel Impedance Value: 456 Ohm
Lead Channel Impedance Value: 475 Ohm
Lead Channel Impedance Value: 513 Ohm
Lead Channel Impedance Value: 608 Ohm
Lead Channel Impedance Value: 608 Ohm
Lead Channel Impedance Value: 779 Ohm
Lead Channel Sensing Intrinsic Amplitude: 0.875 mV
Lead Channel Sensing Intrinsic Amplitude: 27.5 mV
Lead Channel Sensing Intrinsic Amplitude: 27.5 mV
Lead Channel Setting Pacing Amplitude: 2.5 V
Lead Channel Setting Pacing Amplitude: 2.5 V
Lead Channel Setting Pacing Pulse Width: 0.4 ms
Lead Channel Setting Pacing Pulse Width: 0.4 ms
Lead Channel Setting Sensing Sensitivity: 0.45 mV
MDC IDC LEAD IMPLANT DT: 20161114
MDC IDC LEAD IMPLANT DT: 20161114
MDC IDC LEAD LOCATION: 753860
MDC IDC MSMT LEADCHNL LV IMPEDANCE VALUE: 361 Ohm
MDC IDC MSMT LEADCHNL LV IMPEDANCE VALUE: 361 Ohm
MDC IDC MSMT LEADCHNL LV IMPEDANCE VALUE: 361 Ohm
MDC IDC MSMT LEADCHNL LV IMPEDANCE VALUE: 760 Ohm
MDC IDC MSMT LEADCHNL LV IMPEDANCE VALUE: 779 Ohm
MDC IDC MSMT LEADCHNL LV PACING THRESHOLD AMPLITUDE: 0.875 V
MDC IDC MSMT LEADCHNL LV PACING THRESHOLD PULSEWIDTH: 0.4 ms
MDC IDC MSMT LEADCHNL RA IMPEDANCE VALUE: 456 Ohm
MDC IDC MSMT LEADCHNL RA PACING THRESHOLD AMPLITUDE: 0.625 V
MDC IDC MSMT LEADCHNL RA PACING THRESHOLD PULSEWIDTH: 0.4 ms
MDC IDC MSMT LEADCHNL RA SENSING INTR AMPL: 0.875 mV
MDC IDC MSMT LEADCHNL RV PACING THRESHOLD AMPLITUDE: 0.625 V
MDC IDC MSMT LEADCHNL RV PACING THRESHOLD PULSEWIDTH: 0.4 ms
MDC IDC SESS DTM: 20190327052503
MDC IDC SET LEADCHNL RA PACING AMPLITUDE: 2 V
MDC IDC STAT BRADY AP VP PERCENT: 0.15 %
MDC IDC STAT BRADY AP VS PERCENT: 0.03 %

## 2017-12-31 ENCOUNTER — Encounter: Payer: Self-pay | Admitting: Cardiology

## 2018-01-03 ENCOUNTER — Encounter: Payer: Self-pay | Admitting: Physician Assistant

## 2018-01-04 ENCOUNTER — Ambulatory Visit: Payer: Medicare Other | Admitting: Physician Assistant

## 2018-01-04 MED ORDER — LISDEXAMFETAMINE DIMESYLATE 40 MG PO CAPS: 40.0000 mg | ORAL_CAPSULE | ORAL | 0 refills | Status: DC

## 2018-01-04 NOTE — Addendum Note (Signed)
Addended by: Brunetta Jeans on: 01/04/2018 08:11 AM   Modules accepted: Orders

## 2018-01-08 ENCOUNTER — Ambulatory Visit: Payer: Medicare Other | Admitting: Physician Assistant

## 2018-01-16 ENCOUNTER — Telehealth: Payer: Self-pay | Admitting: Internal Medicine

## 2018-01-16 DIAGNOSIS — Z95810 Presence of automatic (implantable) cardiac defibrillator: Secondary | ICD-10-CM | POA: Diagnosis not present

## 2018-01-16 DIAGNOSIS — R072 Precordial pain: Secondary | ICD-10-CM | POA: Diagnosis not present

## 2018-01-16 DIAGNOSIS — R42 Dizziness and giddiness: Secondary | ICD-10-CM | POA: Diagnosis not present

## 2018-01-16 DIAGNOSIS — R001 Bradycardia, unspecified: Secondary | ICD-10-CM | POA: Diagnosis not present

## 2018-01-16 DIAGNOSIS — R002 Palpitations: Secondary | ICD-10-CM | POA: Diagnosis not present

## 2018-01-16 DIAGNOSIS — R7989 Other specified abnormal findings of blood chemistry: Secondary | ICD-10-CM | POA: Diagnosis not present

## 2018-01-16 DIAGNOSIS — Z950 Presence of cardiac pacemaker: Secondary | ICD-10-CM | POA: Diagnosis not present

## 2018-01-16 DIAGNOSIS — R079 Chest pain, unspecified: Secondary | ICD-10-CM | POA: Diagnosis not present

## 2018-01-16 NOTE — Telephone Encounter (Signed)
Patient call transferred into triage. Started today experiencing a lightheaded feeling, then pressure in chest behind breastbone, arms get tingly like when her device gets checked in clinic.  This has been coming and going about every 20 min today.  She cannot send a transmission.  She is sitting with her grandbaby, not at home.  Advised would ask device clinic triage nurse to call her.

## 2018-01-16 NOTE — Telephone Encounter (Signed)
Debra Holloway is not home and cannot send a transmission. She feels like she has made herself more anxious because she's worried about passing out while she's caring for her granddaughter. Her daughter-in-law is on the way home to relieve her babysitting. I recommended that she not drive but that if she could come to the [place] office I would check her ICD today. She says that it is the same distance to her monitor in [place] where she is staying. She is also worried about her BP being low and doesn't have a way to check it now.  She has decided that if the spells continue as frequently as they have been she will call 911 to be evaluated. If she can find a way to the office and the episodes subside she will let us know.

## 2018-01-16 NOTE — Telephone Encounter (Signed)
New Message   Pt c/o of Chest Pain: STAT if CP now or developed within 24 hours  1. Are you having CP right now? no  2. Are you experiencing any other symptoms (ex. SOB, nausea, vomiting, sweating)? Sob, chest pressure, scared, anxiety and lightheadedness   3. How long have you been experiencing CP? This morning  4. Is your CP continuous or coming and going? Coming and going  5. Have you taken Nitroglycerin? no ?

## 2018-01-17 ENCOUNTER — Telehealth: Payer: Self-pay | Admitting: Internal Medicine

## 2018-01-17 ENCOUNTER — Other Ambulatory Visit (HOSPITAL_COMMUNITY): Payer: Self-pay

## 2018-01-17 ENCOUNTER — Telehealth (HOSPITAL_COMMUNITY): Payer: Self-pay | Admitting: *Deleted

## 2018-01-17 ENCOUNTER — Other Ambulatory Visit: Payer: Self-pay

## 2018-01-17 ENCOUNTER — Other Ambulatory Visit: Payer: Self-pay | Admitting: Physician Assistant

## 2018-01-17 ENCOUNTER — Telehealth (HOSPITAL_COMMUNITY): Payer: Self-pay

## 2018-01-17 ENCOUNTER — Encounter (HOSPITAL_COMMUNITY): Payer: Self-pay | Admitting: Emergency Medicine

## 2018-01-17 ENCOUNTER — Emergency Department (HOSPITAL_COMMUNITY)
Admission: EM | Admit: 2018-01-17 | Discharge: 2018-01-17 | Disposition: A | Discharge status: Home / Self Care | Payer: Medicare Other | Attending: Emergency Medicine | Admitting: Emergency Medicine

## 2018-01-17 DIAGNOSIS — I5022 Chronic systolic (congestive) heart failure: Secondary | ICD-10-CM | POA: Insufficient documentation

## 2018-01-17 DIAGNOSIS — R072 Precordial pain: Secondary | ICD-10-CM | POA: Diagnosis not present

## 2018-01-17 DIAGNOSIS — Z79899 Other long term (current) drug therapy: Secondary | ICD-10-CM | POA: Diagnosis not present

## 2018-01-17 DIAGNOSIS — M542 Cervicalgia: Secondary | ICD-10-CM | POA: Insufficient documentation

## 2018-01-17 DIAGNOSIS — R079 Chest pain, unspecified: Secondary | ICD-10-CM | POA: Insufficient documentation

## 2018-01-17 LAB — I-STAT TROPONIN, ED: Troponin i, poc: 0 ng/mL (ref 0.00–0.08)

## 2018-01-17 LAB — BASIC METABOLIC PANEL
Anion gap: 10 (ref 5–15)
BUN: 10 mg/dL (ref 6–20)
CO2: 24 mmol/L (ref 22–32)
Calcium: 9.2 mg/dL (ref 8.9–10.3)
Chloride: 104 mmol/L (ref 101–111)
Creatinine, Ser: 1.03 mg/dL — ABNORMAL HIGH (ref 0.44–1.00)
GFR calc Af Amer: >60 mL/min (ref >60)
GFR calc non Af Amer: 58 mL/min — ABNORMAL LOW (ref >60)
Glucose, Bld: 94 mg/dL (ref 65–99)
Potassium: 4.2 mmol/L (ref 3.5–5.1)
Sodium: 138 mmol/L (ref 135–145)

## 2018-01-17 LAB — CBC
HCT: 39.6 % (ref 36.0–46.0)
Hemoglobin: 12.9 g/dL (ref 12.0–15.0)
MCH: 29.9 pg (ref 26.0–34.0)
MCHC: 32.6 g/dL (ref 30.0–36.0)
MCV: 91.7 fL (ref 78.0–100.0)
Platelets: 224 10*3/uL (ref 150–400)
RBC: 4.32 MIL/uL (ref 3.87–5.11)
RDW: 13.2 % (ref 11.5–15.5)
WBC: 9.5 10*3/uL (ref 4.0–10.5)

## 2018-01-17 NOTE — Telephone Encounter (Signed)
New Message  Pt states that she was in the er due to chest pressure and they were going to run test but she decided to checked out ama because she rather see her cardiologist. Also says they told her she had inverted t waves. Please call

## 2018-01-17 NOTE — Telephone Encounter (Signed)
Pt called our office stating she experienced tingling in her arm and chest pain so she went to [facility] ED by EMS. [facility] wanted to admit her but she refused and left AMA because she wanted to talk to [facility] first. Patient feels better today but has " cramping in the left side of her neck". I spoke with Dr.Bensimhons nurse who reviewed her labs/CT from [place] will follow up with Dr.Bensimhon but said if patient feels bad or starts to experience other symptoms she needs to go to the emergency room.  Pt aware and agreeable with plan. As of now she will wait to hear back from our office.

## 2018-01-17 NOTE — Telephone Encounter (Signed)
Spoke with pt who states she left [facility] AMA this morning. Per the pt, they wanted to admit her for chest pressure, positive d-dimer, and inverted T waves. When she left they strongly advised her to call her cardiology office. I advised her that given her test results and symptoms, I would like for her to go back to the ED for evaluation. She verbalized understanding and stated she would go to [facility] ED today.

## 2018-01-17 NOTE — Telephone Encounter (Signed)
Pt currently being seen in [facility] ED.

## 2018-01-17 NOTE — ED Triage Notes (Addendum)
Pt states yesterday she was having lightheadedness, chest pressure to central chest, tingling in both arms, pain radiating into her shoulder blades. She was screened at [place] she had an elevated D dimer but had a chest XRAY and CT of the chest that she said was clear. New changes to EKG showed inverted T waves. They wanted to admit her for stress test but pt left and wanted to be at [facility], she sees MD Cleda Mccreedy and [place]. No chest pain presently but her only complaint is left neck cramping. Pt had her pacemaker interrogated at [facility], it did not show any irregularities from yesterday. She also had her BNP checked w hx of CHF but it was normal.

## 2018-01-17 NOTE — ED Notes (Signed)
Pt ambulatory with steady gait 

## 2018-01-17 NOTE — Telephone Encounter (Signed)
Pt called back while in ED for further instruction no further instructions at this time

## 2018-01-17 NOTE — ED Provider Notes (Signed)
[facility] EMERGENCY DEPARTMENT Provider Note   CSN: [id number] Arrival date & time: 01/17/18  1128     History   Chief Complaint Chief Complaint  Patient presents with  . Chest Pain    HPI Debra Holloway is a 62 y.o. female.  HPI   62 year old female with a past history of nonischemic cardiomyopathy status post pacemaker.  She presented to [facility] yesterday after having several episodes of substernal chest pain which she describes as crescendo decrescendo pressure in the center of her chest and into her back.  These episodes lasted for less than a minute.  She had 6+ episodes prior to go to the emergency room.  This started while at rest while holding a baby.  Her workup there was significant for an elevated d-dimer but subsequent CT angiography was negative for pulmonary embolism.  It did not know potential bronchitic changes and question of hiatal hernia.  Cardiac enzymes are normal.  I cannot readily review the EKG they did there that they noted changes and recommended admitting for stress testing.  Her cardiology care is here in [place] so she had up leaving AMA.  She has not had any further chest pain today but is having some intermittent aching pain in her left lateral neck.  This seems to come and go without any appreciable exacerbating relieving factors.  She called the cardiology office and was advised to come the emergency room.  She denies any fevers or chills.  No dyspnea.  No unusual leg pain or swelling.  She reports compliance with her medications.  Dr. Caryl Comes is her electrophysiologist.  Dr. Haroldine Laws would follows her for her heart failure.  She had normal coronary arteries on heart catheterization February 2016.   Past Medical History:  Diagnosis Date  . ADD (attention deficit disorder)   . AICD (automatic cardioverter/defibrillator) present 2015/08/27  . Anxiety    "just when my daddy died a few years ago" (2015/08/27)  . AR (allergic  rhinitis)   . CHF (congestive heart failure) ([place]) dx'd 11/2014  . Chicken pox   . Depression    "just when my daddy died a few years ago" (August 27, 2015)  . Dyslipidemia   . Environmental allergies   . H/O vitamin D deficiency   . LBBB (left bundle branch block)    W/1st degree AV Block, Avoid Beta Blockers  . OSA (obstructive sleep apnea) 03/18/2015   Severe OSA with AHI 30/hr; "suppose to wear mask but don't" (27-Aug-2015)  . Seasonal affective disorder [facility])     Patient Active Problem List   Diagnosis Date Noted  . Anxiety and depression 09/21/2016  . Visit for preventive health examination 06/18/2016  . NICM (nonischemic cardiomyopathy) ([place]) 08/27/15  . OSA (obstructive sleep apnea) 03/18/2015  . Chronic systolic heart failure ([place]) 12/07/2014  . Nonischemic dilated cardiomyopathy ([place])   . LBBB (left bundle branch block)   . Dyslipidemia   . Hot flashes, menopausal 09/18/2014  . ADD (attention deficit disorder) 09/06/2013  . Environmental allergies 09/06/2013    Past Surgical History:  Procedure Laterality Date  . CARDIAC CATHETERIZATION    . DILATION AND CURETTAGE OF UTERUS  2003  . EP IMPLANTABLE DEVICE N/A August 27, 2015   Procedure: BiV ICD Insertion CRT-D;  Surgeon: Deboraha Sprang, MD;  Location: [facility] CV LAB;  Service: Cardiovascular;  Laterality: N/A;  . LEFT AND RIGHT HEART CATHETERIZATION WITH CORONARY ANGIOGRAM N/A 11/17/2014   Procedure: LEFT AND RIGHT HEART CATHETERIZATION WITH CORONARY  ANGIOGRAM;  Surgeon: Troy Sine, MD;  Location: [facility] CATH LAB;  Service: Cardiovascular;  Laterality: N/A;  . TOTAL ABDOMINAL HYSTERECTOMY  2004  . TUBAL LIGATION    . WISDOM TOOTH EXTRACTION  1986     OB History   None      Home Medications    Prior to Admission medications   Medication Sig Start Date End Date Taking? Authorizing Provider  acetaminophen (TYLENOL) 500 MG tablet Take 500 mg by mouth every 6 (six) hours as needed for moderate pain.    [provider]  b complex vitamins tablet Take 1 tablet by mouth daily.    [provider]  buPROPion (WELLBUTRIN XL) 150 MG 24 hr tablet TAKE 1 TABLET(150 MG) BY MOUTH DAILY 12/04/16   Brunetta Jeans, PA-C  carvedilol (COREG) 12.5 MG tablet Take 1 tablet (12.5 mg total) by mouth 2 (two) times daily with a meal. 06/06/17   Bensimhon, Shaune Pascal, MD  Cholecalciferol (VITAMIN D PO) Take 1 tablet by mouth daily.    [provider]  fexofenadine (ALLEGRA) 180 MG tablet Take 1 tablet (180 mg total) by mouth daily. 11/30/14   Dorothy Spark, MD  furosemide (LASIX) 20 MG tablet TAKE 1 TABLET(20 MG) BY MOUTH DAILY AS NEEDED 10/19/17   Bensimhon, Shaune Pascal, MD  lisdexamfetamine (VYVANSE) 40 MG capsule Take 1 capsule (40 mg total) by mouth every morning. 01/04/18   Brunetta Jeans, PA-C  lisinopril (PRINIVIL,ZESTRIL) 10 MG tablet TAKE 1 TABLET(10 MG) BY MOUTH AT BEDTIME 06/05/17   Bensimhon, Shaune Pascal, MD  mometasone (NASONEX) 50 MCG/ACT nasal spray Place 2 sprays into the nose daily. 03/13/16   Brunetta Jeans, PA-C  potassium chloride (K-DUR,KLOR-CON) 10 MEQ tablet TAKE 2 TABLETS( 20 MEQ) BY MOUTH AS NEEDED WHEN TAKING LASIX( FUROSEMIDE) 10/19/17   Bensimhon, Shaune Pascal, MD  spironolactone (ALDACTONE) 25 MG tablet TAKE 1 TABLET(25 MG) BY MOUTH DAILY 01/09/17   Bensimhon, Shaune Pascal, MD    Family History Family History  Problem Relation Age of Onset  . COPD Mother        Living  . Pancreatic cancer Father 35       Deceased  . Lung cancer Paternal Grandfather   . Heart disease Paternal Grandmother   . Hypertension Paternal Grandmother   . Alzheimer's disease Maternal Grandfather   . Arthritis/Rheumatoid Maternal Grandmother   . Heart disease Maternal Grandmother        Tachycardia  . Cancer Paternal Uncle   . Healthy Brother        x3  . Healthy Daughter        x3  . Breast cancer Sister 4    Social History Social History   Tobacco Use  . Smoking status: Never Smoker  .  Smokeless tobacco: Never Used  Substance Use Topics  . Alcohol use: Yes    Alcohol/week: 2.4 oz    Types: 4 Glasses of wine per week  . Drug use: No     Allergies   Bee venom; Erythromycin; Lovastatin; Penicillins; and Simvastatin   Review of Systems Review of Systems  All systems reviewed and negative, other than as noted in HPI.  Physical Exam Updated Vital Signs BP (!) 126/56   Pulse 77   Temp 98.5 F (36.9 C) (Oral)   Resp 20   SpO2 100%   Physical Exam  Constitutional: She appears well-developed and well-nourished. No distress.  HENT:  Head: Normocephalic and atraumatic.  Eyes:  Conjunctivae are normal. Right eye exhibits no discharge. Left eye exhibits no discharge.  Neck: Neck supple.  Cardiovascular: Normal rate, regular rhythm and normal heart sounds. Exam reveals no gallop and no friction rub.  No murmur heard. Pulmonary/Chest: Effort normal and breath sounds normal. No respiratory distress.  Abdominal: Soft. She exhibits no distension. There is no tenderness.  Musculoskeletal: She exhibits no edema or tenderness.  Neurological: She is alert.  Skin: Skin is warm and dry.  Psychiatric: She has a normal mood and affect. Her behavior is normal. Thought content normal.  Nursing note and vitals reviewed.    ED Treatments / Results  Labs (all labs ordered are listed, but only abnormal results are displayed) Labs Reviewed  BASIC METABOLIC PANEL - Abnormal; Notable for the following components:      Result Value   Creatinine, Ser 1.03 (*)    GFR calc non Af Amer 58 (*)    All other components within normal limits  CBC  I-STAT TROPONIN, ED    EKG EKG Interpretation  Date/Time:  Thursday January 17 2018 11:33:46 EDT Ventricular Rate:  71 PR Interval:  148 QRS Duration: 106 QT Interval:  408 QTC Calculation: 443 R Axis:   88 Text Interpretation:  Atrial-sensed ventricular-paced rhythm Biventricular pacemaker detected Confirmed by Virgel Manifold [phone])  on 01/17/2018 6:09:44 PM   Radiology No results found.  Procedures Procedures (including critical care time)  Medications Ordered in ED Medications - No data to display   Initial Impression / Assessment and Plan / ED Course  I have reviewed the triage vital signs and the nursing notes.  Pertinent labs & imaging results that were available during my care of the patient were reviewed by me and considered in my medical decision making (see chart for details).     62 year old female with chest pain yesterday which has since resolved and now atypical left neck pain.  She reports that providers at [facility] yesterday told her that she had a heart score for recommending admission.  This is a she was having at that time have since resolved.  Repeat troponin today is normal.  Her EKG shows a paced rhythm.  To be, her current symptoms are very atypical for ACS.  Last heart catheterization a couple years ago with normal coronaries.  My suspicion for ACS is pretty low at this point.  I feel she is appropriate for outpatient follow-up.  She has established care with electrophysiology and the heart failure team.    Final Clinical Impressions(s) / ED Diagnoses   Final diagnoses:  Neck pain on left side    ED Discharge Orders    None       Virgel Manifold, MD 01/19/18 1037

## 2018-01-17 NOTE — ED Notes (Signed)
Pt verbalized understanding discharge instructions and denies any further needs or questions at this time. VS stable, ambulatory and steady gait.   

## 2018-01-18 ENCOUNTER — Other Ambulatory Visit (HOSPITAL_COMMUNITY): Payer: Self-pay | Admitting: Internal Medicine

## 2018-02-18 ENCOUNTER — Ambulatory Visit (HOSPITAL_COMMUNITY)
Admission: RE | Admit: 2018-02-18 | Discharge: 2018-02-18 | Disposition: A | Discharge status: Home / Self Care | Payer: Medicare Other | Source: Ambulatory Visit | Attending: Internal Medicine | Admitting: Internal Medicine

## 2018-02-18 ENCOUNTER — Ambulatory Visit: Payer: Medicare Other | Admitting: Physician Assistant

## 2018-02-18 ENCOUNTER — Encounter (HOSPITAL_COMMUNITY): Payer: Self-pay | Admitting: Internal Medicine

## 2018-02-18 ENCOUNTER — Encounter: Payer: Self-pay | Admitting: Internal Medicine

## 2018-02-18 ENCOUNTER — Encounter: Payer: Self-pay | Admitting: Emergency Medicine

## 2018-02-18 VITALS — BP 132/82 | HR 74 | Wt 218.0 lb

## 2018-02-18 DIAGNOSIS — E559 Vitamin D deficiency, unspecified: Secondary | ICD-10-CM | POA: Diagnosis not present

## 2018-02-18 DIAGNOSIS — Z825 Family history of asthma and other chronic lower respiratory diseases: Secondary | ICD-10-CM | POA: Insufficient documentation

## 2018-02-18 DIAGNOSIS — Z803 Family history of malignant neoplasm of breast: Secondary | ICD-10-CM | POA: Insufficient documentation

## 2018-02-18 DIAGNOSIS — F419 Anxiety disorder, unspecified: Secondary | ICD-10-CM | POA: Insufficient documentation

## 2018-02-18 DIAGNOSIS — I447 Left bundle-branch block, unspecified: Secondary | ICD-10-CM | POA: Diagnosis not present

## 2018-02-18 DIAGNOSIS — I5022 Chronic systolic (congestive) heart failure: Secondary | ICD-10-CM | POA: Diagnosis not present

## 2018-02-18 DIAGNOSIS — Z8249 Family history of ischemic heart disease and other diseases of the circulatory system: Secondary | ICD-10-CM | POA: Diagnosis not present

## 2018-02-18 DIAGNOSIS — R072 Precordial pain: Secondary | ICD-10-CM

## 2018-02-18 DIAGNOSIS — F329 Major depressive disorder, single episode, unspecified: Secondary | ICD-10-CM | POA: Insufficient documentation

## 2018-02-18 DIAGNOSIS — Z800 Family history of malignant neoplasm of digestive organs: Secondary | ICD-10-CM | POA: Insufficient documentation

## 2018-02-18 DIAGNOSIS — E785 Hyperlipidemia, unspecified: Secondary | ICD-10-CM | POA: Diagnosis not present

## 2018-02-18 DIAGNOSIS — G4733 Obstructive sleep apnea (adult) (pediatric): Secondary | ICD-10-CM | POA: Diagnosis not present

## 2018-02-18 DIAGNOSIS — Z79899 Other long term (current) drug therapy: Secondary | ICD-10-CM | POA: Diagnosis not present

## 2018-02-18 DIAGNOSIS — Z95810 Presence of automatic (implantable) cardiac defibrillator: Secondary | ICD-10-CM | POA: Insufficient documentation

## 2018-02-18 DIAGNOSIS — I429 Cardiomyopathy, unspecified: Secondary | ICD-10-CM | POA: Diagnosis not present

## 2018-02-18 DIAGNOSIS — Z8261 Family history of arthritis: Secondary | ICD-10-CM | POA: Insufficient documentation

## 2018-02-18 DIAGNOSIS — Z801 Family history of malignant neoplasm of trachea, bronchus and lung: Secondary | ICD-10-CM | POA: Diagnosis not present

## 2018-02-18 NOTE — Patient Instructions (Signed)
Echocardiogram and Follow up in 9 months.  Please call us in December to schedule your follow up.  [phone], Option 3.

## 2018-02-18 NOTE — Addendum Note (Signed)
Encounter addended by: Darron Doom, RN on: 02/18/2018 2:40 PM  Actions taken: Diagnosis association updated, Order list changed, Sign clinical note

## 2018-02-18 NOTE — Progress Notes (Signed)
Advanced Heart Failure Clinic Note   Patient ID: Debra Holloway, female   DOB: 1956/03/30, 62 y.o.   MRN: [id number] Primary Cardiologist: Meda Coffee  HPI: Debra Holloway is a 62 year old woman with h/o ADD, LBBB and depression. She was admitted to the hospital on 11/14/2014 with ADHF.  Echocardiogram demonstrated LVEF15-20%. Underwent LHC which showed normal coronaries.    She underwent implantation of Medtronic CRT-D by Dr. Caryl Comes in November 2016.  Today she returns for follow up. Was seen in the ED at [facility] on 01/17/18 for multiple episodes of CP. D-dimer +. Chest CT no clot. Troponin normal. Tried to admit her for TWI on ECG but rhythm is chronically paced. Feels good. No further CP. No orthopnea or PND. No edema.   01/20/2015: CMRI- EF 29% RVEF 51% + dyssynchrony. No scar or infiltrative disease.  ICD interrogated today: No VT/AF  Activity 4 hours per day. Fluid well below threshold. 100% v-paced  Echo (6/16):  EF ~30% with significant dyssynchrony, moderate MR Echo (9/16):  EF 25-30%, Grade 1 DD ECHO 01/2016: EF 45-50%.  Echo 8/18: EF 50-55%    ROS: All systems negative except as listed in HPI, PMH and Problem List.  Past Medical History:  Diagnosis Date  . ADD (attention deficit disorder)   . AICD (automatic cardioverter/defibrillator) present 09-05-15  . Anxiety    "just when my daddy died a few years ago" (09/05/15)  . AR (allergic rhinitis)   . CHF (congestive heart failure) ([place]) dx'd 11/2014  . Chicken pox   . Depression    "just when my daddy died a few years ago" (09/05/15)  . Dyslipidemia   . Environmental allergies   . H/O vitamin D deficiency   . LBBB (left bundle branch block)    W/1st degree AV Block, Avoid Beta Blockers  . OSA (obstructive sleep apnea) 03/18/2015   Severe OSA with AHI 30/hr; "suppose to wear mask but don't" (09/05/2015)  . Seasonal affective disorder ([place])     SH:  Social History   Socioeconomic History  . Marital status: Divorced   Spouse name: Not on file  . Number of children: Not on file  . Years of education: Not on file  . Highest education level: Not on file  Occupational History  . Not on file  Social Needs  . Financial resource strain: Not on file  . Food insecurity:    Worry: Not on file    Inability: Not on file  . Transportation needs:    Medical: Not on file    Non-medical: Not on file  Tobacco Use  . Smoking status: Never Smoker  . Smokeless tobacco: Never Used  Substance and Sexual Activity  . Alcohol use: Yes    Alcohol/week: 2.4 oz    Types: 4 Glasses of wine per week  . Drug use: No  . Sexual activity: Not Currently  Lifestyle  . Physical activity:    Days per week: Not on file    Minutes per session: Not on file  . Stress: Not on file  Relationships  . Social connections:    Talks on phone: Not on file    Gets together: Not on file    Attends religious service: Not on file    Active member of club or organization: Not on file    Attends meetings of clubs or organizations: Not on file    Relationship status: Not on file  . Intimate partner violence:    Fear of current or  ex partner: Not on file    Emotionally abused: Not on file    Physically abused: Not on file    Forced sexual activity: Not on file  Other Topics Concern  . Not on file  Social History Narrative  . Not on file    FH:  Family History  Problem Relation Age of Onset  . COPD Mother        Living  . Pancreatic cancer Father 33       Deceased  . Lung cancer Paternal Grandfather   . Heart disease Paternal Grandmother   . Hypertension Paternal Grandmother   . Alzheimer's disease Maternal Grandfather   . Arthritis/Rheumatoid Maternal Grandmother   . Heart disease Maternal Grandmother        Tachycardia  . Cancer Paternal Uncle   . Healthy Brother        x3  . Healthy Daughter        x3  . Breast cancer Sister 41     Current Outpatient Medications  Medication Sig Dispense Refill  . acetaminophen  (TYLENOL) 500 MG tablet Take 500 mg by mouth every 6 (six) hours as needed for moderate pain.    Marland Kitchen b complex vitamins tablet Take 1 tablet by mouth daily.    Marland Kitchen buPROPion (WELLBUTRIN XL) 150 MG 24 hr tablet TAKE 1 TABLET(150 MG) BY MOUTH DAILY 30 tablet 3  . carvedilol (COREG) 12.5 MG tablet Take 1 tablet (12.5 mg total) by mouth 2 (two) times daily with a meal. 60 tablet 11  . Cholecalciferol (VITAMIN D PO) Take 1 tablet by mouth daily.    . fexofenadine (ALLEGRA) 180 MG tablet Take 1 tablet (180 mg total) by mouth daily.    . furosemide (LASIX) 20 MG tablet TAKE 1 TABLET(20 MG) BY MOUTH DAILY AS NEEDED 15 tablet 6  . lisdexamfetamine (VYVANSE) 40 MG capsule Take 1 capsule (40 mg total) by mouth every morning. 30 capsule 0  . lisinopril (PRINIVIL,ZESTRIL) 10 MG tablet TAKE 1 TABLET(10 MG) BY MOUTH AT BEDTIME 30 tablet 11  . mometasone (NASONEX) 50 MCG/ACT nasal spray Place 2 sprays into the nose daily. 17 g 12  . potassium chloride (K-DUR,KLOR-CON) 10 MEQ tablet TAKE 2 TABLETS( 20 MEQ) BY MOUTH AS NEEDED WHEN TAKING LASIX( FUROSEMIDE) 30 tablet 6  . spironolactone (ALDACTONE) 25 MG tablet TAKE 1 TABLET(25 MG) BY MOUTH DAILY 30 tablet 11   No current facility-administered medications for this encounter.     Vitals:   02/18/18 1412  BP: 132/82  Pulse: 74  SpO2: 98%  Weight: 218 lb (98.9 kg)    PHYSICAL EXAM: General:  Well appearing. No resp difficulty HEENT: normal Neck: supple. no JVD. Carotids 2+ bilat; no bruits. No lymphadenopathy or thryomegaly appreciated. Cor: PMI nondisplaced. Regular rate & rhythm. No rubs, gallops or murmurs. Lungs: clear Abdomen: soft, nontender, nondistended. No hepatosplenomegaly. No bruits or masses. Good bowel sounds. Extremities: no cyanosis, clubbing, rash, edema Neuro: alert & orientedx3, cranial nerves grossly intact. moves all 4 extremities w/o difficulty. Affect pleasant  ASSESSMENT & PLAN:  1) Chronic systolic HF: NICM . EF 25-30% by echo in  9/16, stable from 6/16 of about 30%. Normal coronaries on cath. Possible viral CMP versus LBBB. cMRI in 4/16 did not show evidence for infiltrative disease.  Has Mectronic Optivol CRT-D  ECHO from April 2017 with improved EF 45-50%.  - Echo 8/18 EF 50-55%  - EF improved after CRT-D.  - Volume status stable. - Continue current dose  of lisinopril, spiro, and carvedilol  - Labs today - repeat echo at next visit 2) CP - Cath 2016 with normal coronaries. Doubt ischemic. Reassured her  3) LBBB: Chronic. S/p CRT-D 3) OSA: Mild. Unable to tolerate CPAP.  4) ADD and depression: Per PCP \  Glori Bickers MD 2:24 PM

## 2018-03-11 ENCOUNTER — Telehealth: Payer: Self-pay | Admitting: Physician Assistant

## 2018-03-11 ENCOUNTER — Encounter: Payer: Self-pay | Admitting: Physician Assistant

## 2018-03-11 ENCOUNTER — Ambulatory Visit (INDEPENDENT_AMBULATORY_CARE_PROVIDER_SITE_OTHER): Payer: Medicare Other | Admitting: Physician Assistant

## 2018-03-11 ENCOUNTER — Other Ambulatory Visit: Payer: Self-pay

## 2018-03-11 VITALS — BP 124/78 | HR 73 | Temp 98.3°F | Resp 14 | Ht 66.0 in | Wt 214.0 lb

## 2018-03-11 DIAGNOSIS — F988 Other specified behavioral and emotional disorders with onset usually occurring in childhood and adolescence: Secondary | ICD-10-CM

## 2018-03-11 MED ORDER — LISDEXAMFETAMINE DIMESYLATE 40 MG PO CAPS: 40.0000 mg | ORAL_CAPSULE | ORAL | 0 refills | Status: DC

## 2018-03-11 NOTE — Progress Notes (Signed)
Patient presents to clinic today for follow-up of ADD. Patient currently on regimen of Vyvanse 40 mg daily. Has been on this medication for some time with Cardiologist approval. Is taking medication as directed and tolerating well. Is eating and sleeping well without issue.   Past Medical History:  Diagnosis Date  . ADD (attention deficit disorder)   . AICD (automatic cardioverter/defibrillator) present 08/19/2015  . Anxiety    "just when my daddy died a few years ago" (08-19-2015)  . AR (allergic rhinitis)   . CHF (congestive heart failure) ([place]) dx'd 11/2014  . Chicken pox   . Depression    "just when my daddy died a few years ago" (2015/08/19)  . Dyslipidemia   . Environmental allergies   . H/O vitamin D deficiency   . LBBB (left bundle branch block)    W/1st degree AV Block, Avoid Beta Blockers  . OSA (obstructive sleep apnea) 03/18/2015   Severe OSA with AHI 30/hr; "suppose to wear mask but don't" (19-Aug-2015)  . Seasonal affective disorder [facility])     Current Outpatient Medications on File Prior to Visit  Medication Sig Dispense Refill  . b complex vitamins tablet Take 1 tablet by mouth daily.    Marland Kitchen buPROPion (WELLBUTRIN XL) 150 MG 24 hr tablet TAKE 1 TABLET(150 MG) BY MOUTH DAILY 30 tablet 3  . carvedilol (COREG) 12.5 MG tablet Take 1 tablet (12.5 mg total) by mouth 2 (two) times daily with a meal. 60 tablet 11  . Cholecalciferol (VITAMIN D PO) Take 1 tablet by mouth daily.    . fexofenadine (ALLEGRA) 180 MG tablet Take 1 tablet (180 mg total) by mouth daily.    . furosemide (LASIX) 20 MG tablet TAKE 1 TABLET(20 MG) BY MOUTH DAILY AS NEEDED 15 tablet 6  . lisdexamfetamine (VYVANSE) 40 MG capsule Take 1 capsule (40 mg total) by mouth every morning. 30 capsule 0  . lisinopril (PRINIVIL,ZESTRIL) 10 MG tablet TAKE 1 TABLET(10 MG) BY MOUTH AT BEDTIME 30 tablet 11  . mometasone (NASONEX) 50 MCG/ACT nasal spray Place 2 sprays into the nose daily. 17 g 12  . potassium chloride  (K-DUR,KLOR-CON) 10 MEQ tablet TAKE 2 TABLETS( 20 MEQ) BY MOUTH AS NEEDED WHEN TAKING LASIX( FUROSEMIDE) 30 tablet 6  . spironolactone (ALDACTONE) 25 MG tablet TAKE 1 TABLET(25 MG) BY MOUTH DAILY 30 tablet 11  . acetaminophen (TYLENOL) 500 MG tablet Take 500 mg by mouth every 6 (six) hours as needed for moderate pain.     No current facility-administered medications on file prior to visit.     Allergies  Allergen Reactions  . Bee Venom Anaphylaxis  . Erythromycin Itching and Rash  . Lovastatin Other (See Comments)    weakness  . Penicillins Hives, Itching and Rash  . Simvastatin Other (See Comments)    weakness    Family History  Problem Relation Age of Onset  . COPD Mother        Living  . Pancreatic cancer Father 72       Deceased  . Lung cancer Paternal Grandfather   . Heart disease Paternal Grandmother   . Hypertension Paternal Grandmother   . Alzheimer's disease Maternal Grandfather   . Arthritis/Rheumatoid Maternal Grandmother   . Heart disease Maternal Grandmother        Tachycardia  . Cancer Paternal Uncle   . Healthy Brother        x3  . Healthy Daughter        x3  . Breast  cancer Sister 15    Social History   Socioeconomic History  . Marital status: Divorced    Spouse name: Not on file  . Number of children: Not on file  . Years of education: Not on file  . Highest education level: Not on file  Occupational History  . Not on file  Social Needs  . Financial resource strain: Not on file  . Food insecurity:    Worry: Not on file    Inability: Not on file  . Transportation needs:    Medical: Not on file    Non-medical: Not on file  Tobacco Use  . Smoking status: Never Smoker  . Smokeless tobacco: Never Used  Substance and Sexual Activity  . Alcohol use: Yes    Alcohol/week: 2.4 oz    Types: 4 Glasses of wine per week  . Drug use: No  . Sexual activity: Not Currently  Lifestyle  . Physical activity:    Days per week: Not on file    Minutes per  session: Not on file  . Stress: Not on file  Relationships  . Social connections:    Talks on phone: Not on file    Gets together: Not on file    Attends religious service: Not on file    Active member of club or organization: Not on file    Attends meetings of clubs or organizations: Not on file    Relationship status: Not on file  Other Topics Concern  . Not on file  Social History Narrative  . Not on file   Review of Systems - See HPI.  All other ROS are negative.  BP 124/78   Pulse 73   Temp 98.3 F (36.8 C) (Oral)   Resp 14   Ht 5' 6"  (1.676 m)   Wt 214 lb (97.1 kg)   SpO2 98%   BMI 34.54 kg/m   Physical Exam  Constitutional: She appears well-developed and well-nourished.  HENT:  Head: Normocephalic and atraumatic.  Cardiovascular: Normal rate, regular rhythm and normal heart sounds.  Pulmonary/Chest: Effort normal.  Skin: Skin is warm and dry.  Psychiatric: She has a normal mood and affect.  Vitals reviewed.  Recent Results (from the past 2160 hour(s))  CUP PACEART REMOTE DEVICE CHECK     Status: None   Collection Time: 12/26/17  5:25 AM  Result Value Ref Range   Date Time Interrogation Session [id number]    Pulse Generator Manufacturer MERM    Pulse Gen Model [id number] Viva Quad XT CRT-D    Pulse Gen Serial Number [id number]    Clinic Name [facility]    Implantable Pulse Generator Type Cardiac Resynch Therapy Defibulator    Implantable Pulse Generator Implant Date [id number]    Implantable Lead Manufacturer MERM    Implantable Lead Model 4598 Geryl Rankins MRI SureScan    Implantable Lead Serial Number [id number]    Implantable Lead Implant Date 47425956    Implantable Lead Location Detail 1 UNKNOWN    Implantable Lead Location [id number]    Implantable Lead Manufacturer [facility]    Implantable Lead Model 5076 CapSureFix Novus    Implantable Lead Serial Number [id number]    Implantable Lead Implant Date [id number]    Implantable Lead Location Detail  1 APPENDAGE    Implantable Lead Location [id number]    Implantable Lead Manufacturer [facility]    Implantable Lead Model [id number] Sprint Quattro Secure S    Implantable Lead Serial Number [id number]    Implantable Lead  Implant Date 10272536    Implantable Lead Location Detail 1 APEX    Implantable Lead Location [phone]    Lead Channel Setting Sensing Sensitivity 0.45 mV   Lead Channel Setting Pacing Amplitude 2 V   Lead Channel Setting Pacing Pulse Width 0.4 ms   Lead Channel Setting Pacing Amplitude 2.5 V   Lead Channel Setting Pacing Pulse Width 0.4 ms   Lead Channel Setting Pacing Amplitude 2.5 V   Lead Channel Setting Pacing Capture Mode Adaptive Capture    Lead Channel Impedance Value 456 ohm   Lead Channel Sensing Intrinsic Amplitude 0.875 mV   Lead Channel Sensing Intrinsic Amplitude 0.875 mV   Lead Channel Pacing Threshold Amplitude 0.625 V   Lead Channel Pacing Threshold Pulse Width 0.4 ms   Lead Channel Impedance Value 456 ohm   Lead Channel Impedance Value 361 ohm   Lead Channel Sensing Intrinsic Amplitude 27.5 mV   Lead Channel Sensing Intrinsic Amplitude 27.5 mV   Lead Channel Pacing Threshold Amplitude 0.625 V   Lead Channel Pacing Threshold Pulse Width 0.4 ms   HighPow Impedance 74 ohm   Lead Channel Impedance Value 760 ohm   Lead Channel Impedance Value 779 ohm   Lead Channel Impedance Value 779 ohm   Lead Channel Impedance Value 475 ohm   Lead Channel Impedance Value 608 ohm   Lead Channel Impedance Value 608 ohm   Lead Channel Impedance Value 513 ohm   Lead Channel Impedance Value 361 ohm   Lead Channel Impedance Value 361 ohm   Lead Channel Impedance Value 361 ohm   Lead Channel Pacing Threshold Amplitude 0.875 V   Lead Channel Pacing Threshold Pulse Width 0.4 ms   Battery Status OK    Battery Remaining Longevity 62 mo   Battery Voltage 2.97 V   Brady Statistic RA Percent Paced 0.17 %   Brady Statistic RV Percent Paced 99.29 %   Brady Statistic AP VP Percent 0.15 %     Brady Statistic AS VP Percent 99.28 %   Brady Statistic AP VS Percent 0.03 %   Brady Statistic AS VS Percent 0.55 %   Eval Rhythm As/Bi-Vp   Basic metabolic panel     Status: Abnormal   Collection Time: 01/17/18 11:54 AM  Result Value Ref Range   Sodium 138 135 - 145 mmol/L   Potassium 4.2 3.5 - 5.1 mmol/L   Chloride 104 101 - 111 mmol/L   CO2 24 22 - 32 mmol/L   Glucose, Bld 94 65 - 99 mg/dL   BUN 10 6 - 20 mg/dL   Creatinine, Ser 1.03 (H) 0.44 - 1.00 mg/dL   Calcium 9.2 8.9 - 10.3 mg/dL   GFR calc non Af Amer 58 (L) >60 mL/min   GFR calc Af Amer >60 >60 mL/min    Comment: (NOTE) The eGFR has been calculated using the CKD EPI equation. This calculation has not been validated in all clinical situations. eGFR's persistently <60 mL/min signify possible Chronic Kidney Disease.    Anion gap 10 5 - 15    Comment: Performed at [facility] [address], [place] [address]  CBC     Status: None   Collection Time: 01/17/18 11:54 AM  Result Value Ref Range   WBC 9.5 4.0 - 10.5 K/uL   RBC 4.32 3.87 - 5.11 MIL/uL   Hemoglobin 12.9 12.0 - 15.0 g/dL   HCT 39.6 36.0 - 46.0 %   MCV 91.7 78.0 - 100.0 fL  MCH 29.9 26.0 - 34.0 pg   MCHC 32.6 30.0 - 36.0 g/dL   RDW 13.2 11.5 - 15.5 %   Platelets 224 150 - 400 K/uL    Comment: Performed at [facility], [place] [address], [place], [place] [address]  I-stat troponin, ED     Status: None   Collection Time: 01/17/18  1:00 PM  Result Value Ref Range   Troponin i, poc 0.00 0.00 - 0.08 ng/mL   Comment 3            Comment: Due to the release kinetics of cTnI, a negative result within the first hours of the onset of symptoms does not rule out myocardial infarction with certainty. If myocardial infarction is still suspected, repeat the test at appropriate intervals.    Assessment/Plan: ADD (attention deficit disorder) Doing well. No changes. Continue current regimen.     Leeanne Rio, PA-C

## 2018-03-11 NOTE — Telephone Encounter (Signed)
Rx has been sent to pharmacy

## 2018-03-11 NOTE — Telephone Encounter (Signed)
Patient called in, via the P.E.C[facility] to ask about the Vyvance prescription that was discussed earlier today during her appointment. Patient states that the pharmacy has not yet received the prescription and she is asking that it be sent in for her to pick up today. Had P.E.C. agent relay message to patient that a note was being sent to the provider. Please call the patient to confirm prescription will be sent to pharmacy.

## 2018-03-11 NOTE — Assessment & Plan Note (Signed)
Doing well. No changes. Continue current regimen.

## 2018-03-11 NOTE — Patient Instructions (Signed)
I am glad you are doing well overall. We have updated your contract with [place] for your medications. We will renew yearly.  Follow-up with me in 6 months for a complete physical.  Return sooner as needed.

## 2018-03-27 ENCOUNTER — Ambulatory Visit (INDEPENDENT_AMBULATORY_CARE_PROVIDER_SITE_OTHER): Payer: Medicare Other | Admitting: *Deleted

## 2018-03-27 DIAGNOSIS — I428 Other cardiomyopathies: Secondary | ICD-10-CM

## 2018-03-27 DIAGNOSIS — I5022 Chronic systolic (congestive) heart failure: Secondary | ICD-10-CM | POA: Diagnosis not present

## 2018-03-27 NOTE — Progress Notes (Signed)
Remote ICD transmission.   

## 2018-03-28 ENCOUNTER — Encounter: Payer: Self-pay | Admitting: Cardiology

## 2018-03-28 LAB — CUP PACEART REMOTE DEVICE CHECK
Battery Remaining Longevity: 59 mo
Battery Voltage: 2.96 V
Brady Statistic AP VP Percent: 0.21 %
Brady Statistic AS VS Percent: 0.14 %
Brady Statistic RV Percent Paced: 99.81 %
HighPow Impedance: 72 Ohm
Implantable Lead Implant Date: 20161114
Implantable Lead Location: 753859
Implantable Lead Location: 753860
Implantable Lead Model: 4598
Implantable Lead Model: 5076
Implantable Pulse Generator Implant Date: 20161114
Lead Channel Impedance Value: 342 Ohm
Lead Channel Impedance Value: 361 Ohm
Lead Channel Impedance Value: 361 Ohm
Lead Channel Impedance Value: 361 Ohm
Lead Channel Impedance Value: 399 Ohm
Lead Channel Impedance Value: 532 Ohm
Lead Channel Impedance Value: 589 Ohm
Lead Channel Impedance Value: 779 Ohm
Lead Channel Impedance Value: 779 Ohm
Lead Channel Impedance Value: 779 Ohm
Lead Channel Pacing Threshold Amplitude: 0.625 V
Lead Channel Pacing Threshold Amplitude: 0.625 V
Lead Channel Pacing Threshold Amplitude: 0.75 V
Lead Channel Pacing Threshold Pulse Width: 0.4 ms
Lead Channel Pacing Threshold Pulse Width: 0.4 ms
Lead Channel Sensing Intrinsic Amplitude: 2.125 mV
Lead Channel Sensing Intrinsic Amplitude: 2.125 mV
Lead Channel Sensing Intrinsic Amplitude: 25.75 mV
Lead Channel Setting Pacing Amplitude: 2 V
Lead Channel Setting Pacing Amplitude: 2.5 V
Lead Channel Setting Pacing Pulse Width: 0.4 ms
Lead Channel Setting Sensing Sensitivity: 0.45 mV
MDC IDC LEAD IMPLANT DT: 20161114
MDC IDC LEAD IMPLANT DT: 20161114
MDC IDC LEAD LOCATION: 753858
MDC IDC MSMT LEADCHNL LV IMPEDANCE VALUE: 475 Ohm
MDC IDC MSMT LEADCHNL LV IMPEDANCE VALUE: 589 Ohm
MDC IDC MSMT LEADCHNL RA IMPEDANCE VALUE: 456 Ohm
MDC IDC MSMT LEADCHNL RV PACING THRESHOLD PULSEWIDTH: 0.4 ms
MDC IDC MSMT LEADCHNL RV SENSING INTR AMPL: 25.75 mV
MDC IDC SESS DTM: 20190626041803
MDC IDC SET LEADCHNL LV PACING AMPLITUDE: 2.25 V
MDC IDC SET LEADCHNL LV PACING PULSEWIDTH: 0.4 ms
MDC IDC STAT BRADY AP VS PERCENT: 0.02 %
MDC IDC STAT BRADY AS VP PERCENT: 99.63 %
MDC IDC STAT BRADY RA PERCENT PACED: 0.23 %

## 2018-04-22 ENCOUNTER — Encounter: Payer: Self-pay | Admitting: Physician Assistant

## 2018-04-22 MED ORDER — LISDEXAMFETAMINE DIMESYLATE 40 MG PO CAPS: 40.0000 mg | ORAL_CAPSULE | ORAL | 0 refills | Status: DC

## 2018-04-22 NOTE — Telephone Encounter (Signed)
Vyvanse last refilled on 03/11/18 #30 Last OV: 03/11/18 CSC: 03/11/18  Please advise

## 2018-05-29 ENCOUNTER — Other Ambulatory Visit: Payer: Self-pay | Admitting: Physician Assistant

## 2018-05-29 MED ORDER — LISDEXAMFETAMINE DIMESYLATE 40 MG PO CAPS: 40.0000 mg | ORAL_CAPSULE | ORAL | 0 refills | Status: DC

## 2018-05-29 NOTE — Telephone Encounter (Signed)
Last refill of Vyvanse on 04/22/18 #30 Last OV was 03/11/18 6 month follow up CPE Please advise

## 2018-06-17 ENCOUNTER — Other Ambulatory Visit (HOSPITAL_COMMUNITY): Payer: Self-pay | Admitting: Internal Medicine

## 2018-06-24 ENCOUNTER — Other Ambulatory Visit: Payer: Self-pay | Admitting: Physician Assistant

## 2018-06-24 NOTE — Telephone Encounter (Signed)
Last OV 03/11/18 vyvanse last filled 05/29/18 #30 with 0

## 2018-06-25 MED ORDER — LISDEXAMFETAMINE DIMESYLATE 40 MG PO CAPS: 40.0000 mg | ORAL_CAPSULE | ORAL | 0 refills | Status: DC

## 2018-06-26 ENCOUNTER — Ambulatory Visit (INDEPENDENT_AMBULATORY_CARE_PROVIDER_SITE_OTHER): Payer: Medicare Other | Admitting: *Deleted

## 2018-06-26 DIAGNOSIS — I5022 Chronic systolic (congestive) heart failure: Secondary | ICD-10-CM

## 2018-06-26 DIAGNOSIS — I428 Other cardiomyopathies: Secondary | ICD-10-CM

## 2018-06-26 NOTE — Progress Notes (Signed)
Remote ICD transmission.   

## 2018-06-27 ENCOUNTER — Encounter: Payer: Self-pay | Admitting: Cardiology

## 2018-07-16 LAB — CUP PACEART REMOTE DEVICE CHECK
Battery Remaining Longevity: 55 mo
Brady Statistic AP VS Percent: 0.03 %
Brady Statistic AS VS Percent: 0.64 %
HighPow Impedance: 85 Ohm
Implantable Lead Implant Date: 20161114
Implantable Lead Implant Date: 20161114
Implantable Lead Location: 753858
Implantable Lead Location: 753859
Implantable Lead Location: 753860
Implantable Lead Model: 4598
Implantable Pulse Generator Implant Date: 20161114
Lead Channel Impedance Value: 418 Ohm
Lead Channel Impedance Value: 418 Ohm
Lead Channel Impedance Value: 456 Ohm
Lead Channel Impedance Value: 532 Ohm
Lead Channel Impedance Value: 836 Ohm
Lead Channel Impedance Value: 893 Ohm
Lead Channel Pacing Threshold Amplitude: 0.625 V
Lead Channel Pacing Threshold Amplitude: 0.875 V
Lead Channel Pacing Threshold Pulse Width: 0.4 ms
Lead Channel Pacing Threshold Pulse Width: 0.4 ms
Lead Channel Sensing Intrinsic Amplitude: 1.5 mV
Lead Channel Sensing Intrinsic Amplitude: 1.5 mV
Lead Channel Setting Pacing Amplitude: 2 V
Lead Channel Setting Pacing Pulse Width: 0.4 ms
Lead Channel Setting Pacing Pulse Width: 0.4 ms
Lead Channel Setting Sensing Sensitivity: 0.45 mV
MDC IDC LEAD IMPLANT DT: 20161114
MDC IDC MSMT BATTERY VOLTAGE: 2.95 V
MDC IDC MSMT LEADCHNL LV IMPEDANCE VALUE: 418 Ohm
MDC IDC MSMT LEADCHNL LV IMPEDANCE VALUE: 551 Ohm
MDC IDC MSMT LEADCHNL LV IMPEDANCE VALUE: 646 Ohm
MDC IDC MSMT LEADCHNL LV IMPEDANCE VALUE: 665 Ohm
MDC IDC MSMT LEADCHNL LV IMPEDANCE VALUE: 836 Ohm
MDC IDC MSMT LEADCHNL LV PACING THRESHOLD PULSEWIDTH: 0.4 ms
MDC IDC MSMT LEADCHNL RA IMPEDANCE VALUE: 418 Ohm
MDC IDC MSMT LEADCHNL RA PACING THRESHOLD AMPLITUDE: 0.625 V
MDC IDC MSMT LEADCHNL RV IMPEDANCE VALUE: 361 Ohm
MDC IDC MSMT LEADCHNL RV SENSING INTR AMPL: 27.25 mV
MDC IDC MSMT LEADCHNL RV SENSING INTR AMPL: 27.25 mV
MDC IDC SESS DTM: 20190925083725
MDC IDC SET LEADCHNL LV PACING AMPLITUDE: 2.5 V
MDC IDC SET LEADCHNL RV PACING AMPLITUDE: 2.5 V
MDC IDC STAT BRADY AP VP PERCENT: 0.37 %
MDC IDC STAT BRADY AS VP PERCENT: 98.97 %
MDC IDC STAT BRADY RA PERCENT PACED: 0.39 %
MDC IDC STAT BRADY RV PERCENT PACED: 99.16 %

## 2018-07-30 ENCOUNTER — Other Ambulatory Visit: Payer: Self-pay

## 2018-07-30 ENCOUNTER — Ambulatory Visit (INDEPENDENT_AMBULATORY_CARE_PROVIDER_SITE_OTHER): Payer: Medicare Other | Admitting: Physician Assistant

## 2018-07-30 ENCOUNTER — Encounter: Payer: Self-pay | Admitting: Physician Assistant

## 2018-07-30 VITALS — BP 122/78 | HR 73 | Temp 98.6°F | Resp 14 | Ht 66.0 in | Wt 215.0 lb

## 2018-07-30 DIAGNOSIS — Z0000 Encounter for general adult medical examination without abnormal findings: Secondary | ICD-10-CM

## 2018-07-30 DIAGNOSIS — I5022 Chronic systolic (congestive) heart failure: Secondary | ICD-10-CM

## 2018-07-30 DIAGNOSIS — E785 Hyperlipidemia, unspecified: Secondary | ICD-10-CM | POA: Diagnosis not present

## 2018-07-30 DIAGNOSIS — F419 Anxiety disorder, unspecified: Secondary | ICD-10-CM

## 2018-07-30 DIAGNOSIS — Z23 Encounter for immunization: Secondary | ICD-10-CM

## 2018-07-30 DIAGNOSIS — F988 Other specified behavioral and emotional disorders with onset usually occurring in childhood and adolescence: Secondary | ICD-10-CM

## 2018-07-30 DIAGNOSIS — F329 Major depressive disorder, single episode, unspecified: Secondary | ICD-10-CM

## 2018-07-30 DIAGNOSIS — I428 Other cardiomyopathies: Secondary | ICD-10-CM

## 2018-07-30 LAB — COMPREHENSIVE METABOLIC PANEL
ALT: 31 U/L (ref 0–35)
AST: 27 U/L (ref 0–37)
Albumin: 4.4 g/dL (ref 3.5–5.2)
Alkaline Phosphatase: 66 U/L (ref 39–117)
BUN: 18 mg/dL (ref 6–23)
CHLORIDE: 104 meq/L (ref 96–112)
CO2: 28 mEq/L (ref 19–32)
Calcium: 9.9 mg/dL (ref 8.4–10.5)
Creatinine, Ser: 0.88 mg/dL (ref 0.40–1.20)
GFR: 69.47 mL/min (ref >60.00)
Glucose, Bld: 96 mg/dL (ref 70–99)
POTASSIUM: 4.6 meq/L (ref 3.5–5.1)
SODIUM: 138 meq/L (ref 135–145)
Total Bilirubin: 0.4 mg/dL (ref 0.2–1.2)
Total Protein: 7.8 g/dL (ref 6.0–8.3)

## 2018-07-30 LAB — LIPID PANEL
CHOL/HDL RATIO: 4
Cholesterol: 222 mg/dL — ABNORMAL HIGH (ref 0–200)
HDL: 61.7 mg/dL (ref >39.00)
LDL Cholesterol: 136 mg/dL — ABNORMAL HIGH (ref 0–99)
NonHDL: 160.41
TRIGLYCERIDES: 122 mg/dL (ref 0.0–149.0)
VLDL: 24.4 mg/dL (ref 0.0–40.0)

## 2018-07-30 LAB — CBC WITH DIFFERENTIAL/PLATELET
BASOS PCT: 1.1 % (ref 0.0–3.0)
Basophils Absolute: 0.1 10*3/uL (ref 0.0–0.1)
EOS ABS: 0.1 10*3/uL (ref 0.0–0.7)
Eosinophils Relative: 0.8 % (ref 0.0–5.0)
HCT: 41.1 % (ref 36.0–46.0)
Hemoglobin: 13.8 g/dL (ref 12.0–15.0)
Lymphocytes Relative: 29.8 % (ref 12.0–46.0)
Lymphs Abs: 2.8 10*3/uL (ref 0.7–4.0)
MCHC: 33.6 g/dL (ref 30.0–36.0)
MCV: 91 fl (ref 78.0–100.0)
MONO ABS: 0.6 10*3/uL (ref 0.1–1.0)
Monocytes Relative: 6.6 % (ref 3.0–12.0)
NEUTROS ABS: 5.8 10*3/uL (ref 1.4–7.7)
Neutrophils Relative %: 61.7 % (ref 43.0–77.0)
Platelets: 252 10*3/uL (ref 150.0–400.0)
RBC: 4.52 Mil/uL (ref 3.87–5.11)
RDW: 13.4 % (ref 11.5–15.5)
WBC: 9.4 10*3/uL (ref 4.0–10.5)

## 2018-07-30 LAB — TSH: TSH: 2.16 u[IU]/mL (ref 0.35–4.50)

## 2018-07-30 LAB — HEMOGLOBIN A1C: Hgb A1c MFr Bld: 5.5 % (ref 4.6–6.5)

## 2018-07-30 MED ORDER — LISDEXAMFETAMINE DIMESYLATE 40 MG PO CAPS: 40.0000 mg | ORAL_CAPSULE | ORAL | 0 refills | Status: DC

## 2018-07-30 NOTE — Progress Notes (Signed)
Patient presents to clinic today for annual exam.  Patient is fasting for labs.  Acute Concerns: Denies acute concerns today.  Health Maintenance: Immunizations -- Agrees to flu shot today.  Colonoscopy -- Declines colonoscopy. Cologuard reviewed. Declines. Mammogram -- Declines all screening AMA. PAP -- s/p hysterectomy. No history of cervical cancer.   Past Medical History:  Diagnosis Date  . ADD (attention deficit disorder)   . AICD (automatic cardioverter/defibrillator) present 08-31-2015  . Anxiety    "just when my daddy died a few years ago" (08/31/2015)  . AR (allergic rhinitis)   . CHF (congestive heart failure) ([place]) dx'd 11/2014  . Chicken pox   . Depression    "just when my daddy died a few years ago" (08-31-2015)  . Dyslipidemia   . Environmental allergies   . H/O vitamin D deficiency   . LBBB (left bundle branch block)    W/1st degree AV Block, Avoid Beta Blockers  . OSA (obstructive sleep apnea) 03/18/2015   Severe OSA with AHI 30/hr; "suppose to wear mask but don't" (2015/08/31)  . Seasonal affective disorder [facility])     Past Surgical History:  Procedure Laterality Date  . CARDIAC CATHETERIZATION    . DILATION AND CURETTAGE OF UTERUS  2003  . EP IMPLANTABLE DEVICE N/A 2015-08-31   Procedure: BiV ICD Insertion CRT-D;  Surgeon: Deboraha Sprang, MD;  Location: [facility];  Service: Cardiovascular;  Laterality: N/A;  . LEFT AND RIGHT HEART CATHETERIZATION WITH CORONARY ANGIOGRAM N/A 11/17/2014   Procedure: LEFT AND RIGHT HEART CATHETERIZATION WITH CORONARY ANGIOGRAM;  Surgeon: Troy Sine, MD;  Location: [facility] CATH LAB;  Service: Cardiovascular;  Laterality: N/A;  . TOTAL ABDOMINAL HYSTERECTOMY  2004  . TUBAL LIGATION    . WISDOM TOOTH EXTRACTION  1986    Current Outpatient Medications on File Prior to Visit  Medication Sig Dispense Refill  . acetaminophen (TYLENOL) 500 MG tablet Take 500 mg by mouth every 6 (six) hours as needed for moderate pain.      Marland Kitchen b complex vitamins tablet Take 1 tablet by mouth daily.    Marland Kitchen buPROPion (WELLBUTRIN XL) 150 MG 24 hr tablet TAKE 1 TABLET(150 MG) BY MOUTH DAILY 30 tablet 3  . carvedilol (COREG) 12.5 MG tablet TAKE 1 TABLET BY MOUTH TWICE DAILY WITH A MEAL 60 tablet 6  . Cholecalciferol (VITAMIN D PO) Take 1 tablet by mouth daily.    . fexofenadine (ALLEGRA) 180 MG tablet Take 1 tablet (180 mg total) by mouth daily.    . furosemide (LASIX) 20 MG tablet TAKE 1 TABLET(20 MG) BY MOUTH DAILY AS NEEDED 15 tablet 6  . lisdexamfetamine (VYVANSE) 40 MG capsule Take 1 capsule (40 mg total) by mouth every morning. 30 capsule 0  . lisinopril (PRINIVIL,ZESTRIL) 10 MG tablet TAKE 1 TABLET(10 MG) BY MOUTH AT BEDTIME 30 tablet 6  . mometasone (NASONEX) 50 MCG/ACT nasal spray Place 2 sprays into the nose daily. 17 g 12  . potassium chloride (K-DUR,KLOR-CON) 10 MEQ tablet TAKE 2 TABLETS( 20 MEQ) BY MOUTH AS NEEDED WHEN TAKING LASIX( FUROSEMIDE) 30 tablet 6  . spironolactone (ALDACTONE) 25 MG tablet TAKE 1 TABLET(25 MG) BY MOUTH DAILY 30 tablet 11   No current facility-administered medications on file prior to visit.     Allergies  Allergen Reactions  . Bee Venom Anaphylaxis  . Erythromycin Itching and Rash  . Lovastatin Other (See Comments)    weakness  . Penicillins Hives, Itching and Rash  .  Simvastatin Other (See Comments)    weakness    Family History  Problem Relation Age of Onset  . COPD Mother        Living  . Pancreatic cancer Father 77       Deceased  . Lung cancer Paternal Grandfather   . Heart disease Paternal Grandmother   . Hypertension Paternal Grandmother   . Alzheimer's disease Maternal Grandfather   . Arthritis/Rheumatoid Maternal Grandmother   . Heart disease Maternal Grandmother        Tachycardia  . Cancer Paternal Uncle   . Healthy Brother        x3  . Healthy Daughter        x3  . Breast cancer Sister 25    Social History   Socioeconomic History  . Marital status: Divorced     Spouse name: Not on file  . Number of children: Not on file  . Years of education: Not on file  . Highest education level: Not on file  Occupational History  . Not on file  Social Needs  . Financial resource strain: Not on file  . Food insecurity:    Worry: Not on file    Inability: Not on file  . Transportation needs:    Medical: Not on file    Non-medical: Not on file  Tobacco Use  . Smoking status: Never Smoker  . Smokeless tobacco: Never Used  Substance and Sexual Activity  . Alcohol use: Yes    Alcohol/week: 4.0 standard drinks    Types: 4 Glasses of wine per week  . Drug use: No  . Sexual activity: Not Currently  Lifestyle  . Physical activity:    Days per week: Not on file    Minutes per session: Not on file  . Stress: Not on file  Relationships  . Social connections:    Talks on phone: Not on file    Gets together: Not on file    Attends religious service: Not on file    Active member of club or organization: Not on file    Attends meetings of clubs or organizations: Not on file    Relationship status: Not on file  . Intimate partner violence:    Fear of current or ex partner: Not on file    Emotionally abused: Not on file    Physically abused: Not on file    Forced sexual activity: Not on file  Other Topics Concern  . Not on file  Social History Narrative  . Not on file   Review of Systems  Constitutional: Negative for fever and weight loss.  HENT: Negative for ear discharge, ear pain, hearing loss and tinnitus.   Eyes: Negative for blurred vision, double vision, photophobia and pain.  Respiratory: Negative for cough and shortness of breath.   Cardiovascular: Negative for chest pain and palpitations.  Gastrointestinal: Negative for abdominal pain, blood in stool, constipation, diarrhea, heartburn, melena, nausea and vomiting.  Genitourinary: Negative for dysuria, flank pain, frequency, hematuria and urgency.  Musculoskeletal: Negative for falls.   Neurological: Negative for dizziness, loss of consciousness and headaches.  Endo/Heme/Allergies: Negative for environmental allergies.  Psychiatric/Behavioral: Negative for depression, hallucinations, substance abuse and suicidal ideas. The patient is not nervous/anxious and does not have insomnia.     BP 122/78   Pulse 73   Temp 98.6 F (37 C) (Oral)   Resp 14   Ht 5\' 6"  (1.676 m)   Wt 215 lb (97.5 kg)   SpO2  99%   BMI 34.70 kg/m   Physical Exam  Constitutional: She is oriented to person, place, and time.  HENT:  Head: Normocephalic and atraumatic.  Right Ear: Tympanic membrane, external ear and ear canal normal.  Left Ear: Tympanic membrane, external ear and ear canal normal.  Nose: Nose normal. No mucosal edema.  Mouth/Throat: Uvula is midline, oropharynx is clear and moist and mucous membranes are normal. No oropharyngeal exudate or posterior oropharyngeal erythema.  Eyes: Pupils are equal, round, and reactive to light. Conjunctivae are normal.  Neck: Neck supple. No thyromegaly present.  Cardiovascular: Normal rate, regular rhythm, normal heart sounds and intact distal pulses.  Pulmonary/Chest: Effort normal and breath sounds normal. No respiratory distress. She has no wheezes. She has no rales.  Abdominal: Soft. Bowel sounds are normal. She exhibits no distension and no mass. There is no tenderness. There is no rebound and no guarding.  Lymphadenopathy:    She has no cervical adenopathy.  Neurological: She is alert and oriented to person, place, and time. No cranial nerve deficit.  Skin: Skin is warm and dry. No rash noted.  Vitals reviewed.  Recent Results (from the past 2160 hour(s))  CUP PACEART REMOTE DEVICE CHECK     Status: None   Collection Time: 06/26/18  8:37 AM  Result Value Ref Range   Date Time Interrogation Session [id number]    Pulse Generator Manufacturer MERM    Pulse Gen Model [id number] Viva Javier Docker XT CRT-D    Pulse Gen Serial Number [id number]     Clinic Name [facility]    Implantable Pulse Generator Type Cardiac Resynch Therapy Defibulator    Implantable Pulse Generator Implant Date [id number]    Implantable Lead Manufacturer MERM    Implantable Lead Model 4598 Attain Performa S MRI SureScan    Implantable Lead Serial Number [id number]    Implantable Lead Implant Date 36644034    Implantable Lead Location Detail 1 UNKNOWN    Implantable Lead Location [id number]    Implantable Lead Manufacturer MERM    Implantable Lead Model 5076 CapSureFix Novus    Implantable Lead Serial Number [id number]    Implantable Lead Implant Date [id number]    Implantable Lead Location Detail 1 APPENDAGE    Implantable Lead Location [id number]    Implantable Lead Manufacturer [facility]    Implantable Lead Model [id number] Sprint Quattro Secure S    Implantable Lead Serial Number [id number]    Implantable Lead Implant Date [id number]    Implantable Lead Location Detail 1 APEX    Implantable Lead Location [id number]    Lead Channel Setting Sensing Sensitivity 0.45 mV   Lead Channel Setting Pacing Amplitude 2 V   Lead Channel Setting Pacing Pulse Width 0.4 ms   Lead Channel Setting Pacing Amplitude 2.5 V   Lead Channel Setting Pacing Pulse Width 0.4 ms   Lead Channel Setting Pacing Amplitude 2.5 V   Lead Channel Setting Pacing Capture Mode Adaptive Capture    Lead Channel Impedance Value 418 ohm   Lead Channel Sensing Intrinsic Amplitude 1.5 mV   Lead Channel Sensing Intrinsic Amplitude 1.5 mV   Lead Channel Pacing Threshold Amplitude 0.625 V   Lead Channel Pacing Threshold Pulse Width 0.4 ms   Lead Channel Impedance Value 418 ohm   Lead Channel Impedance Value 361 ohm   Lead Channel Sensing Intrinsic Amplitude 27.25 mV   Lead Channel Sensing Intrinsic Amplitude 27.25 mV   Lead Channel Pacing Threshold Amplitude 0.625 V   Lead Channel Pacing Threshold Pulse  Width 0.4 ms   HighPow Impedance 85 ohm   Lead Channel Impedance Value 836 ohm   Lead Channel Impedance Value  836 ohm   Lead Channel Impedance Value 893 ohm   Lead Channel Impedance Value 532 ohm   Lead Channel Impedance Value 646 ohm   Lead Channel Impedance Value 665 ohm   Lead Channel Impedance Value 551 ohm   Lead Channel Impedance Value 418 ohm   Lead Channel Impedance Value 418 ohm   Lead Channel Impedance Value 456 ohm   Lead Channel Pacing Threshold Amplitude 0.875 V   Lead Channel Pacing Threshold Pulse Width 0.4 ms   Battery Status OK    Battery Remaining Longevity 55 mo   Battery Voltage 2.95 V   Brady Statistic RA Percent Paced 0.39 %   Brady Statistic RV Percent Paced 99.16 %   Brady Statistic AP VP Percent 0.37 %   Brady Statistic AS VP Percent 98.97 %   Brady Statistic AP VS Percent 0.03 %   Brady Statistic AS VS Percent 0.64 %   Eval Rhythm As/Bi-Vp     Assessment/Plan: 1. Dyslipidemia Taking medications as directed. Followed by Cardiology. Will check fasting labs today. Dietary and exercise recommendations reviewed.  - CBC with Differential/Platelet - Comprehensive metabolic panel - Lipid panel - Hemoglobin A1c  2. Anxiety and depression Stable today. Continue current regimen.  - CBC with Differential/Platelet - TSH  3. Visit for preventive health examination Depression screen negative. Health Maintenance reviewed. Preventive schedule discussed and handout given in AVS. Will obtain fasting labs today.  - CBC with Differential/Platelet - Comprehensive metabolic panel  4. Chronic systolic heart failure (HCC) Euvolemic on exam today. Will check labs. Continue care as directed by Cardiology. - Comprehensive metabolic panel - Lipid panel - Hemoglobin A1c - TSH  5. NICM (nonischemic cardiomyopathy) ([place]) Continue care as directed by Cardiology  6. Attention deficit disorder, unspecified hyperactivity presence Stable. Continue current regimen.    Leeanne Rio, PA-C

## 2018-07-30 NOTE — Patient Instructions (Signed)
Please go to the lab for blood work.   Our office will call you with your results unless you have chosen to receive results via MyChart.  If your blood work is normal we will follow-up each year for physicals and as scheduled for chronic medical problems.  If anything is abnormal we will treat accordingly and get you in for a follow-up.   Preventive Care 61-64 Years, Female Preventive care refers to lifestyle choices and visits with your health care provider that can promote health and wellness. What does preventive care include?  A yearly physical exam. This is also called an annual well check.  Dental exams once or twice a year.  Routine eye exams. Ask your health care provider how often you should have your eyes checked.  Personal lifestyle choices, including:  Daily care of your teeth and gums.  Regular physical activity.  Eating a healthy diet.  Avoiding tobacco and drug use.  Limiting alcohol use.  Practicing safe sex.  Taking low-dose aspirin daily starting at age 50.  Taking vitamin and mineral supplements as recommended by your health care provider. What happens during an annual well check? The services and screenings done by your health care provider during your annual well check will depend on your age, overall health, lifestyle risk factors, and family history of disease. Counseling  Your health care provider may ask you questions about your:  Alcohol use.  Tobacco use.  Drug use.  Emotional well-being.  Home and relationship well-being.  Sexual activity.  Eating habits.  Work and work environment.  Method of birth control.  Menstrual cycle.  Pregnancy history. Screening  You may have the following tests or measurements:  Height, weight, and BMI.  Blood pressure.  Lipid and cholesterol levels. These may be checked every 5 years, or more frequently if you are over 50 years old.  Skin check.  Lung cancer screening. You may have this  screening every year starting at age 55 if you have a 30-pack-year history of smoking and currently smoke or have quit within the past 15 years.  Fecal occult blood test (FOBT) of the stool. You may have this test every year starting at age 50.  Flexible sigmoidoscopy or colonoscopy. You may have a sigmoidoscopy every 5 years or a colonoscopy every 10 years starting at age 50.  Hepatitis C blood test.  Hepatitis B blood test.  Sexually transmitted disease (STD) testing.  Diabetes screening. This is done by checking your blood sugar (glucose) after you have not eaten for a while (fasting). You may have this done every 1-3 years.  Mammogram. This may be done every 1-2 years. Talk to your health care provider about when you should start having regular mammograms. This may depend on whether you have a family history of breast cancer.  BRCA-related cancer screening. This may be done if you have a family history of breast, ovarian, tubal, or peritoneal cancers.  Pelvic exam and Pap test. This may be done every 3 years starting at age 21. Starting at age 30, this may be done every 5 years if you have a Pap test in combination with an HPV test.  Bone density scan. This is done to screen for osteoporosis. You may have this scan if you are at high risk for osteoporosis. Discuss your test results, treatment options, and if necessary, the need for more tests with your health care provider. Vaccines  Your health care provider may recommend certain vaccines, such as:  Influenza vaccine.   is recommended every year.  Tetanus, diphtheria, and acellular pertussis (Tdap, Td) vaccine. You may need a Td booster every 10 years.  Varicella vaccine. You may need this if you have not been vaccinated.  Zoster vaccine. You may need this after age 60.  Measles, mumps, and rubella (MMR) vaccine. You may need at least one dose of MMR if you were born in 1957 or later. You may also need a second  dose.  Pneumococcal 13-valent conjugate (PCV13) vaccine. You may need this if you have certain conditions and were not previously vaccinated.  Pneumococcal polysaccharide (PPSV23) vaccine. You may need one or two doses if you smoke cigarettes or if you have certain conditions.  Meningococcal vaccine. You may need this if you have certain conditions.  Hepatitis A vaccine. You may need this if you have certain conditions or if you travel or work in places where you may be exposed to hepatitis A.  Hepatitis B vaccine. You may need this if you have certain conditions or if you travel or work in places where you may be exposed to hepatitis B.  Haemophilus influenzae type b (Hib) vaccine. You may need this if you have certain conditions.  Talk to your health care provider about which screenings and vaccines you need and how often you need them. This information is not intended to replace advice given to you by your health care provider. Make sure you discuss any questions you have with your health care provider. Document Released: 10/15/2015 Document Revised: 06/07/2016 Document Reviewed: 07/20/2015 Elsevier Interactive Patient Education  2018 Elsevier Inc.   

## 2018-09-26 ENCOUNTER — Ambulatory Visit (INDEPENDENT_AMBULATORY_CARE_PROVIDER_SITE_OTHER): Payer: Medicare Other

## 2018-09-26 DIAGNOSIS — I428 Other cardiomyopathies: Secondary | ICD-10-CM | POA: Diagnosis not present

## 2018-09-26 DIAGNOSIS — I5022 Chronic systolic (congestive) heart failure: Secondary | ICD-10-CM

## 2018-09-26 LAB — CUP PACEART REMOTE DEVICE CHECK
Battery Remaining Longevity: 50 mo
Battery Voltage: 2.95 V
Brady Statistic AP VP Percent: 0.22 %
Brady Statistic AP VS Percent: 0.03 %
Brady Statistic AS VS Percent: 0.45 %
HIGH POWER IMPEDANCE MEASURED VALUE: 72 Ohm
Implantable Lead Implant Date: 20161114
Implantable Lead Implant Date: 20161114
Implantable Lead Location: 753859
Implantable Lead Location: 753860
Implantable Lead Model: 4598
Implantable Lead Model: 5076
Implantable Pulse Generator Implant Date: 20161114
Lead Channel Impedance Value: 342 Ohm
Lead Channel Impedance Value: 399 Ohm
Lead Channel Impedance Value: 399 Ohm
Lead Channel Impedance Value: 399 Ohm
Lead Channel Impedance Value: 513 Ohm
Lead Channel Impedance Value: 551 Ohm
Lead Channel Impedance Value: 665 Ohm
Lead Channel Impedance Value: 817 Ohm
Lead Channel Impedance Value: 836 Ohm
Lead Channel Impedance Value: 836 Ohm
Lead Channel Pacing Threshold Amplitude: 0.625 V
Lead Channel Pacing Threshold Amplitude: 0.75 V
Lead Channel Pacing Threshold Amplitude: 0.875 V
Lead Channel Pacing Threshold Pulse Width: 0.4 ms
Lead Channel Pacing Threshold Pulse Width: 0.4 ms
Lead Channel Sensing Intrinsic Amplitude: 0.75 mV
Lead Channel Sensing Intrinsic Amplitude: 0.75 mV
Lead Channel Setting Pacing Amplitude: 2 V
Lead Channel Setting Pacing Amplitude: 2.5 V
Lead Channel Setting Pacing Amplitude: 2.5 V
Lead Channel Setting Pacing Pulse Width: 0.4 ms
Lead Channel Setting Sensing Sensitivity: 0.45 mV
MDC IDC LEAD IMPLANT DT: 20161114
MDC IDC LEAD LOCATION: 753858
MDC IDC MSMT LEADCHNL LV IMPEDANCE VALUE: 361 Ohm
MDC IDC MSMT LEADCHNL LV IMPEDANCE VALUE: 665 Ohm
MDC IDC MSMT LEADCHNL RA IMPEDANCE VALUE: 418 Ohm
MDC IDC MSMT LEADCHNL RV PACING THRESHOLD PULSEWIDTH: 0.4 ms
MDC IDC MSMT LEADCHNL RV SENSING INTR AMPL: 25 mV
MDC IDC MSMT LEADCHNL RV SENSING INTR AMPL: 25 mV
MDC IDC SESS DTM: 20191226093626
MDC IDC SET LEADCHNL LV PACING PULSEWIDTH: 0.4 ms
MDC IDC STAT BRADY AS VP PERCENT: 99.3 %
MDC IDC STAT BRADY RA PERCENT PACED: 0.25 %
MDC IDC STAT BRADY RV PERCENT PACED: 99.37 %

## 2018-09-26 NOTE — Progress Notes (Signed)
Remote ICD transmission.   

## 2018-10-01 ENCOUNTER — Encounter: Payer: Self-pay | Admitting: Cardiology

## 2018-10-01 NOTE — Progress Notes (Signed)
Letter  

## 2018-10-03 ENCOUNTER — Other Ambulatory Visit: Payer: Self-pay | Admitting: Physician Assistant

## 2018-10-03 MED ORDER — LISDEXAMFETAMINE DIMESYLATE 40 MG PO CAPS: 40.0000 mg | ORAL_CAPSULE | ORAL | 0 refills | Status: DC

## 2018-10-03 NOTE — Telephone Encounter (Signed)
Last OV 07/30/18 vyvanse last filled 07/30/18 #30 with 0

## 2018-12-02 ENCOUNTER — Ambulatory Visit (HOSPITAL_BASED_OUTPATIENT_CLINIC_OR_DEPARTMENT_OTHER)
Admission: RE | Admit: 2018-12-02 | Discharge: 2018-12-02 | Disposition: A | Discharge status: Home / Self Care | Payer: Medicare Other | Source: Ambulatory Visit | Attending: Internal Medicine | Admitting: Internal Medicine

## 2018-12-02 ENCOUNTER — Encounter: Payer: Self-pay | Admitting: Physician Assistant

## 2018-12-02 ENCOUNTER — Ambulatory Visit (HOSPITAL_COMMUNITY)
Admission: RE | Admit: 2018-12-02 | Discharge: 2018-12-02 | Disposition: A | Discharge status: Home / Self Care | Payer: Medicare Other | Source: Ambulatory Visit | Attending: Internal Medicine | Admitting: Internal Medicine

## 2018-12-02 VITALS — BP 132/86 | HR 81 | Wt 215.0 lb

## 2018-12-02 DIAGNOSIS — R42 Dizziness and giddiness: Secondary | ICD-10-CM | POA: Diagnosis not present

## 2018-12-02 DIAGNOSIS — Z95810 Presence of automatic (implantable) cardiac defibrillator: Secondary | ICD-10-CM | POA: Diagnosis not present

## 2018-12-02 DIAGNOSIS — R0602 Shortness of breath: Secondary | ICD-10-CM | POA: Insufficient documentation

## 2018-12-02 DIAGNOSIS — F329 Major depressive disorder, single episode, unspecified: Secondary | ICD-10-CM | POA: Insufficient documentation

## 2018-12-02 DIAGNOSIS — I447 Left bundle-branch block, unspecified: Secondary | ICD-10-CM

## 2018-12-02 DIAGNOSIS — R002 Palpitations: Secondary | ICD-10-CM

## 2018-12-02 DIAGNOSIS — I428 Other cardiomyopathies: Secondary | ICD-10-CM | POA: Insufficient documentation

## 2018-12-02 DIAGNOSIS — R079 Chest pain, unspecified: Secondary | ICD-10-CM | POA: Diagnosis not present

## 2018-12-02 DIAGNOSIS — G4733 Obstructive sleep apnea (adult) (pediatric): Secondary | ICD-10-CM | POA: Diagnosis not present

## 2018-12-02 DIAGNOSIS — I5022 Chronic systolic (congestive) heart failure: Secondary | ICD-10-CM

## 2018-12-02 DIAGNOSIS — Z7901 Long term (current) use of anticoagulants: Secondary | ICD-10-CM | POA: Insufficient documentation

## 2018-12-02 DIAGNOSIS — E785 Hyperlipidemia, unspecified: Secondary | ICD-10-CM | POA: Insufficient documentation

## 2018-12-02 DIAGNOSIS — F419 Anxiety disorder, unspecified: Secondary | ICD-10-CM | POA: Diagnosis not present

## 2018-12-02 DIAGNOSIS — I420 Dilated cardiomyopathy: Secondary | ICD-10-CM

## 2018-12-02 DIAGNOSIS — Z79899 Other long term (current) drug therapy: Secondary | ICD-10-CM | POA: Insufficient documentation

## 2018-12-02 MED ORDER — LISDEXAMFETAMINE DIMESYLATE 40 MG PO CAPS: 40.0000 mg | ORAL_CAPSULE | ORAL | 0 refills | Status: DC

## 2018-12-02 NOTE — Progress Notes (Signed)
  Echocardiogram 2D Echocardiogram has been performed.  Debra Holloway 12/02/2018, 9:49 AM

## 2018-12-02 NOTE — Progress Notes (Signed)
REDS VEST READING= 31

## 2018-12-02 NOTE — Telephone Encounter (Signed)
Last rx for Vyvanse #30 10/21/18 LOV: 07/30/18 CSC: 03/12/2018  Please advise

## 2018-12-02 NOTE — Addendum Note (Signed)
Encounter addended by: Scarlette Calico, RN on: 12/02/2018 11:08 AM  Actions taken: Visit diagnoses modified, Order list changed, Diagnosis association updated, Clinical Note Signed

## 2018-12-02 NOTE — Progress Notes (Signed)
Advanced Heart Failure Clinic Note   Patient ID: Debra Holloway, female   DOB: 11/20/1955, 63 y.o.   MRN: [id number] Primary Cardiologist: Meda Coffee  HPI: Debra Holloway is a 63 y.o. female with h/o ADD, LBBB and depression. She was admitted to the hospital on 11/14/2014 with ADHF.  Echocardiogram demonstrated LVEF15-20%. Underwent LHC which showed normal coronaries.    She underwent implantation of Medtronic CRT-D by Dr. Caryl Comes in November 2016.  She returns today for HF follow up. Echo today EF 50-55%. Overall doing okay. She had the flu in January and a stomach bug in February. She has been fatigued since then. She is SOB with stairs. Takes breaks in the grocery store. She gets dizzy with quick movements, including right and left and also standing. Does okay if she moves slowly. No further CP. Has occasional fluttering in her chest. Occasional BLE edema. Chronically sleeps on 2 pillows. No PND. Takes lasix PRN, not much since she has been sick. Taking all medications. Not weighing regularly. Checks BP at home and occasionally runs in the 80s.  ICD interrogated today: Fluid up and down over the last few months. Currently thoracic impedence below threshold and trending down (indicating volume). Frequent short spurts of AF on device. No VT/VF. Active 2-3 hours/day.   REDs 31%   01/20/2015: CMRI- EF 29% RVEF 51% + dyssynchrony. No scar or infiltrative disease.  Echo (6/16):  EF ~30% with significant dyssynchrony, moderate MR Echo (9/16):  EF 25-30%, Grade 1 DD ECHO 01/2016: EF 45-50%.  Echo 8/18: EF 50-55%  Echo 3/19: EF 55%  Review of systems complete and found to be negative unless listed in HPI.   Past Medical History:  Diagnosis Date  . ADD (attention deficit disorder)   . AICD (automatic cardioverter/defibrillator) present 09-07-2015  . Anxiety    "just when my daddy died a few years ago" (Sep 07, 2015)  . AR (allergic rhinitis)   . CHF (congestive heart failure) ([place]) dx'd 11/2014    . Chicken pox   . Depression    "just when my daddy died a few years ago" (09/07/15)  . Dyslipidemia   . Environmental allergies   . H/O vitamin D deficiency   . LBBB (left bundle branch block)    W/1st degree AV Block, Avoid Beta Blockers  . OSA (obstructive sleep apnea) 03/18/2015   Severe OSA with AHI 30/hr; "suppose to wear mask but don't" (2015-09-07)  . Seasonal affective disorder ([place])     SH:  Social History   Socioeconomic History  . Marital status: Divorced    Spouse name: Not on file  . Number of children: Not on file  . Years of education: Not on file  . Highest education level: Not on file  Occupational History  . Not on file  Social Needs  . Financial resource strain: Not on file  . Food insecurity:    Worry: Not on file    Inability: Not on file  . Transportation needs:    Medical: Not on file    Non-medical: Not on file  Tobacco Use  . Smoking status: Never Smoker  . Smokeless tobacco: Never Used  Substance and Sexual Activity  . Alcohol use: Yes    Alcohol/week: 4.0 standard drinks    Types: 4 Glasses of wine per week  . Drug use: No  . Sexual activity: Not Currently  Lifestyle  . Physical activity:    Days per week: Not on file    Minutes  per session: Not on file  . Stress: Not on file  Relationships  . Social connections:    Talks on phone: Not on file    Gets together: Not on file    Attends religious service: Not on file    Active member of club or organization: Not on file    Attends meetings of clubs or organizations: Not on file    Relationship status: Not on file  . Intimate partner violence:    Fear of current or ex partner: Not on file    Emotionally abused: Not on file    Physically abused: Not on file    Forced sexual activity: Not on file  Other Topics Concern  . Not on file  Social History Narrative  . Not on file    FH:  Family History  Problem Relation Age of Onset  . COPD Mother        Living  . Pancreatic  cancer Father 70       Deceased  . Lung cancer Paternal Grandfather   . Heart disease Paternal Grandmother   . Hypertension Paternal Grandmother   . Alzheimer's disease Maternal Grandfather   . Arthritis/Rheumatoid Maternal Grandmother   . Heart disease Maternal Grandmother        Tachycardia  . Cancer Paternal Uncle   . Healthy Brother        x3  . Healthy Daughter        x3  . Breast cancer Sister 68     Current Outpatient Medications  Medication Sig Dispense Refill  . acetaminophen (TYLENOL) 500 MG tablet Take 500 mg by mouth every 6 (six) hours as needed for moderate pain.    Marland Kitchen b complex vitamins tablet Take 1 tablet by mouth daily.    Marland Kitchen buPROPion (WELLBUTRIN XL) 150 MG 24 hr tablet TAKE 1 TABLET(150 MG) BY MOUTH DAILY 30 tablet 3  . carvedilol (COREG) 12.5 MG tablet TAKE 1 TABLET BY MOUTH TWICE DAILY WITH A MEAL 60 tablet 6  . Cholecalciferol (VITAMIN D PO) Take 1 tablet by mouth daily.    . fexofenadine (ALLEGRA) 180 MG tablet Take 1 tablet (180 mg total) by mouth daily.    . furosemide (LASIX) 20 MG tablet TAKE 1 TABLET(20 MG) BY MOUTH DAILY AS NEEDED 15 tablet 6  . lisdexamfetamine (VYVANSE) 40 MG capsule Take 1 capsule (40 mg total) by mouth every morning. 30 capsule 0  . lisinopril (PRINIVIL,ZESTRIL) 10 MG tablet TAKE 1 TABLET(10 MG) BY MOUTH AT BEDTIME 30 tablet 6  . mometasone (NASONEX) 50 MCG/ACT nasal spray Place 2 sprays into the nose daily. 17 g 12  . potassium chloride (K-DUR,KLOR-CON) 10 MEQ tablet TAKE 2 TABLETS( 20 MEQ) BY MOUTH AS NEEDED WHEN TAKING LASIX( FUROSEMIDE) 30 tablet 6  . spironolactone (ALDACTONE) 25 MG tablet TAKE 1 TABLET(25 MG) BY MOUTH DAILY 30 tablet 11   No current facility-administered medications for this encounter.     Vitals:   12/02/18 1026  BP: 132/86  Pulse: 81  SpO2: 98%  Weight: 97.5 kg (215 lb)    Wt Readings from Last 3 Encounters:  12/02/18 97.5 kg (215 lb)  07/30/18 97.5 kg (215 lb)  03/11/18 97.1 kg (214 lb)     PHYSICAL EXAM: General: Well appearing. No resp difficulty. HEENT: Normal Neck: Supple. JVP flat. Carotids 2+ bilat; no bruits. No thyromegaly or nodule noted. Cor: PMI nondisplaced. RRR, No M/G/R noted Lungs: CTAB, normal effort. Abdomen: Obese Soft, non-tender, non-distended, no HSM.  No bruits or masses. +BS  Extremities: No cyanosis, clubbing, or rash. R and LLE no edema.  Neuro: Alert & orientedx3, cranial nerves grossly intact. moves all 4 extremities w/o difficulty. Affect pleasant   ASSESSMENT & PLAN:  1) Chronic systolic HF: NICM . EF 25-30% by echo in 9/16, stable from 6/16 of about 30%. Normal coronaries on cath. Possible viral CMP versus LBBB. cMRI in 4/16 did not show evidence for infiltrative disease.  Has Mectronic Optivol CRT-D  ECHO from April 2017 with improved EF 45-50%.  - Echo 8/18 EF 50-55%.  - EF improved after CRT-D.  - Echo today EF 50-55% - Volume status stable on exam, but trending up on device. She takes lasix PRN. Clip 31% - Continue current dose of lisinopril, spiro, and carvedilol. Takes lisinopril and spiro at night.  - BMET today 2) CP - Cath 2016 with normal coronaries.  - No recent CP.  3) LBBB: Chronic. S/p CRT-D. No change.  3) OSA: Mild. Unable to tolerate CPAP. No change.  4) ADD and depression: Per PCP 5) Dizziness 6) Short runs of AF - Will interrogate ICD  Georgiana Shore NP 10:30 AM  Patient seen and examined with the above-signed Advanced Practice Provider and/or Housestaff. I personally reviewed laboratory data, imaging studies and relevant notes. I independently examined the patient and formulated the important aspects of the plan. I have edited the note to reflect any of my changes or salient points. I have personally discussed the plan with the patient and/or family.  Doing very well. EF stable for 2 years after CRT. Echo today EF 50-55%. ICD stable. Having some palpitations will interrogate pacer fully. REDS 31%. Volume status  looks good. Will graduate from [facility] today and refer back to Dr. Meda Coffee.   Glori Bickers, MD  11:01 AM

## 2018-12-02 NOTE — Patient Instructions (Signed)
Congratulations!!! You have graduated from the heart failure clinic.  You have been referred to Dr Meda Coffee at [facility], you don't need to be seen there for 6 months and they will call you closer to then to schedule.

## 2018-12-13 ENCOUNTER — Other Ambulatory Visit (HOSPITAL_COMMUNITY): Payer: Self-pay | Admitting: Internal Medicine

## 2018-12-26 ENCOUNTER — Ambulatory Visit (INDEPENDENT_AMBULATORY_CARE_PROVIDER_SITE_OTHER): Payer: Medicare Other | Admitting: *Deleted

## 2018-12-26 ENCOUNTER — Other Ambulatory Visit: Payer: Self-pay

## 2018-12-26 DIAGNOSIS — I420 Dilated cardiomyopathy: Secondary | ICD-10-CM

## 2018-12-26 DIAGNOSIS — I5022 Chronic systolic (congestive) heart failure: Secondary | ICD-10-CM

## 2018-12-26 LAB — CUP PACEART REMOTE DEVICE CHECK
Battery Remaining Longevity: 44 mo
Battery Voltage: 2.93 V
Brady Statistic AP VP Percent: 0.64 %
Brady Statistic AS VP Percent: 98.76 %
Brady Statistic AS VS Percent: 0.58 %
Brady Statistic RA Percent Paced: 0.66 %
Brady Statistic RV Percent Paced: 99.24 %
HighPow Impedance: 83 Ohm
Implantable Lead Implant Date: 20161114
Implantable Lead Location: 753860
Implantable Lead Model: 5076
Implantable Pulse Generator Implant Date: 20161114
Lead Channel Impedance Value: 361 Ohm
Lead Channel Impedance Value: 399 Ohm
Lead Channel Impedance Value: 399 Ohm
Lead Channel Impedance Value: 418 Ohm
Lead Channel Impedance Value: 589 Ohm
Lead Channel Impedance Value: 608 Ohm
Lead Channel Impedance Value: 646 Ohm
Lead Channel Impedance Value: 817 Ohm
Lead Channel Impedance Value: 836 Ohm
Lead Channel Impedance Value: 836 Ohm
Lead Channel Pacing Threshold Amplitude: 0.625 V
Lead Channel Pacing Threshold Amplitude: 0.625 V
Lead Channel Pacing Threshold Amplitude: 0.875 V
Lead Channel Pacing Threshold Pulse Width: 0.4 ms
Lead Channel Pacing Threshold Pulse Width: 0.4 ms
Lead Channel Pacing Threshold Pulse Width: 0.4 ms
Lead Channel Sensing Intrinsic Amplitude: 1.625 mV
Lead Channel Sensing Intrinsic Amplitude: 1.625 mV
Lead Channel Sensing Intrinsic Amplitude: 28 mV
Lead Channel Sensing Intrinsic Amplitude: 28 mV
Lead Channel Setting Pacing Amplitude: 2 V
Lead Channel Setting Pacing Amplitude: 2.5 V
Lead Channel Setting Pacing Pulse Width: 0.4 ms
MDC IDC LEAD IMPLANT DT: 20161114
MDC IDC LEAD IMPLANT DT: 20161114
MDC IDC LEAD LOCATION: 753858
MDC IDC LEAD LOCATION: 753859
MDC IDC MSMT LEADCHNL LV IMPEDANCE VALUE: 532 Ohm
MDC IDC MSMT LEADCHNL RV IMPEDANCE VALUE: 361 Ohm
MDC IDC MSMT LEADCHNL RV IMPEDANCE VALUE: 418 Ohm
MDC IDC SESS DTM: 20200326113825
MDC IDC SET LEADCHNL RV PACING AMPLITUDE: 2.5 V
MDC IDC SET LEADCHNL RV PACING PULSEWIDTH: 0.4 ms
MDC IDC SET LEADCHNL RV SENSING SENSITIVITY: 0.45 mV
MDC IDC STAT BRADY AP VS PERCENT: 0.03 %

## 2019-01-02 ENCOUNTER — Encounter: Payer: Self-pay | Admitting: Cardiology

## 2019-01-02 NOTE — Progress Notes (Signed)
Remote ICD transmission.   

## 2019-01-15 ENCOUNTER — Encounter: Payer: Self-pay | Admitting: Physician Assistant

## 2019-01-17 ENCOUNTER — Encounter: Payer: Self-pay | Admitting: Physician Assistant

## 2019-01-17 ENCOUNTER — Other Ambulatory Visit: Payer: Self-pay

## 2019-01-17 ENCOUNTER — Ambulatory Visit (INDEPENDENT_AMBULATORY_CARE_PROVIDER_SITE_OTHER): Payer: Medicare Other | Admitting: Physician Assistant

## 2019-01-17 VITALS — BP 130/77 | HR 75 | Ht 66.0 in | Wt 214.0 lb

## 2019-01-17 DIAGNOSIS — F419 Anxiety disorder, unspecified: Secondary | ICD-10-CM

## 2019-01-17 DIAGNOSIS — F329 Major depressive disorder, single episode, unspecified: Secondary | ICD-10-CM | POA: Diagnosis not present

## 2019-01-17 DIAGNOSIS — E785 Hyperlipidemia, unspecified: Secondary | ICD-10-CM

## 2019-01-17 DIAGNOSIS — F988 Other specified behavioral and emotional disorders with onset usually occurring in childhood and adolescence: Secondary | ICD-10-CM | POA: Diagnosis not present

## 2019-01-17 MED ORDER — LISDEXAMFETAMINE DIMESYLATE 40 MG PO CAPS: 40.0000 mg | ORAL_CAPSULE | ORAL | 0 refills | Status: DC

## 2019-01-17 NOTE — Assessment & Plan Note (Signed)
Has made significant changes to diet and exercise. She has been encouraged to keep up with this. Will recheck lipids once she is back in town in a couple of months. She will call back to schedule lab.

## 2019-01-17 NOTE — Assessment & Plan Note (Signed)
Doing very well. Recent follow-up with Cardiology with improved EF. Is being released from heart failure clinic. Cleared to continue her Vyvanse. Medication refilled.

## 2019-01-17 NOTE — Patient Instructions (Signed)
Instructions sent to MyChart.  It was so great to see you today!  I am glad you are safe, sound and healthy! Please continue your chronic medications as directed, increasing the Wellbutrin to 300 mg (2 tablets) daily over the weekend. Message me Monday to let me know how you are tolerating so we can decide on next steps.  Once things have calmed down we will want you to come in for lab-only appointment to recheck cholesterol levels. Keep up the good work with diet and exercise!  Hang in there!

## 2019-01-17 NOTE — Assessment & Plan Note (Signed)
Situational deterioration. Will attempt trial of temporary increase in Wellbutrin XL to 300 mg daily. She is going to start this weekend and follow-up via [place] on Monday to let us know how she is tolerating so we can send in new script. Plan to wean back down to 150 once current situation has resolved.

## 2019-01-17 NOTE — Progress Notes (Signed)
I have discussed the procedure for the virtual visit with the patient who has given consent to proceed with assessment and treatment.   Makaylynn Bonillas S Roselind Klus, CMA     

## 2019-01-17 NOTE — Progress Notes (Signed)
Virtual Visit via Video   I connected with patient on 01/17/19 at  8:30 AM EDT by a video enabled telemedicine application and verified that I am speaking with the correct person using two identifiers.  Location patient: Home Location provider: [facility], Office Persons participating in the virtual visit: Patient, Provider, [place] (Patina Moore)  I discussed the limitations of evaluation and management by telemedicine and the availability of in person appointments. The patient expressed understanding and agreed to proceed.  Subjective:   HPI:   Patient presents today via [facility] for follow-up of chronic medical issues.  Hyperlipidemia -- Patient endorses working very hard on diet. Has cut out red meats, saturated fats and limiting starches. Is eating more vegetables, etc. Is walking for 30 minutes daily.    Lab Results  Component Value Date   LDLCALC 136 (H) 07/30/2018   ADD -- Currently on a regimen of Vyvanse 40 mg daily. Has been on this regimen for quite some time and done very well.  Helps her focus and calms mind down so she can sleep at night. Is out of medication for a few days. Has been cleared by Cardiology to continue Vyvanse.   Anxiety/Depression -- Currently on a regimen of Wellbutrin XL 150 mg daily. Has been on this regimen for some time which has done well for her. Notes breakthrough depressive symptoms with crying spells over the past couple of months which she feels is situational due to being isolated from family during [place] pandemic. Denies any panic. Denies SI/HI. Is resting well at night.   ROS:   See pertinent positives and negatives per HPI.  Patient Active Problem List   Diagnosis Date Noted  . Anxiety and depression 09/21/2016  . Visit for preventive health examination 06/18/2016  . NICM (nonischemic cardiomyopathy) ([place]) 08/16/2015  . OSA (obstructive sleep apnea) 03/18/2015  . Chronic systolic heart failure ([place]) 12/07/2014  . Nonischemic  dilated cardiomyopathy ([place])   . LBBB (left bundle branch block)   . Dyslipidemia   . Hot flashes, menopausal 09/18/2014  . ADD (attention deficit disorder) 09/06/2013  . Environmental allergies 09/06/2013    Social History   Tobacco Use  . Smoking status: Never Smoker  . Smokeless tobacco: Never Used  Substance Use Topics  . Alcohol use: Yes    Alcohol/week: 4.0 standard drinks    Types: 4 Glasses of wine per week    Current Outpatient Medications:  .  acetaminophen (TYLENOL) 500 MG tablet, Take 500 mg by mouth every 6 (six) hours as needed for moderate pain., Disp: , Rfl:  .  b complex vitamins tablet, Take 1 tablet by mouth daily., Disp: , Rfl:  .  buPROPion (WELLBUTRIN XL) 150 MG 24 hr tablet, TAKE 1 TABLET(150 MG) BY MOUTH DAILY, Disp: 30 tablet, Rfl: 3 .  carvedilol (COREG) 12.5 MG tablet, TAKE 1 TABLET BY MOUTH TWICE DAILY WITH A MEAL, Disp: 60 tablet, Rfl: 6 .  Cholecalciferol (VITAMIN D PO), Take 1 tablet by mouth daily., Disp: , Rfl:  .  fexofenadine (ALLEGRA) 180 MG tablet, Take 1 tablet (180 mg total) by mouth daily., Disp: , Rfl:  .  fluticasone (FLONASE) 50 MCG/ACT nasal spray, Place 2 sprays into both nostrils daily., Disp: , Rfl:  .  furosemide (LASIX) 20 MG tablet, TAKE 1 TABLET(20 MG) BY MOUTH DAILY AS NEEDED, Disp: 15 tablet, Rfl: 6 .  lisdexamfetamine (VYVANSE) 40 MG capsule, Take 1 capsule (40 mg total) by mouth every morning., Disp: 30 capsule, Rfl:  0 .  lisinopril (PRINIVIL,ZESTRIL) 10 MG tablet, TAKE 1 TABLET(10 MG) BY MOUTH AT BEDTIME, Disp: 30 tablet, Rfl: 6 .  mometasone (NASONEX) 50 MCG/ACT nasal spray, Place 2 sprays into the nose daily. (Patient taking differently: Place 2 sprays into the nose as needed. ), Disp: 17 g, Rfl: 12 .  potassium chloride (K-DUR,KLOR-CON) 10 MEQ tablet, TAKE 2 TABLETS( 20 MEQ) BY MOUTH AS NEEDED WHEN TAKING LASIX( FUROSEMIDE), Disp: 30 tablet, Rfl: 6 .  spironolactone (ALDACTONE) 25 MG tablet, TAKE 1 TABLET(25 MG) BY MOUTH DAILY,  Disp: 30 tablet, Rfl: 11  Allergies  Allergen Reactions  . Bee Venom Anaphylaxis  . Erythromycin Itching and Rash  . Lovastatin Other (See Comments)    weakness  . Penicillins Hives, Itching and Rash  . Simvastatin Other (See Comments)    weakness    Objective:   BP (!) 147/97   Pulse 75   Ht 5\' 6"  (1.676 m)   Wt 214 lb (97.1 kg)   BMI 34.54 kg/m   Patient is well-developed, well-nourished in no acute distress.  Resting comfortably in chair at home.  Head is normocephalic, atraumatic.  No labored breathing.  Speech is clear and coherent with logical contest.  Patient is alert and oriented at baseline.   Assessment and Plan:    ADD (attention deficit disorder) Doing very well. Recent follow-up with Cardiology with improved EF. Is being released from heart failure clinic. Cleared to continue her Vyvanse. Medication refilled.   Dyslipidemia Has made significant changes to diet and exercise. She has been encouraged to keep up with this. Will recheck lipids once she is back in town in a couple of months. She will call back to schedule lab.  Anxiety and depression Situational deterioration. Will attempt trial of temporary increase in Wellbutrin XL to 300 mg daily. She is going to start this weekend and follow-up via [place] on Monday to let us know how she is tolerating so we can send in new script. Plan to wean back down to 150 once current situation has resolved.     Leeanne Rio, PA-C 01/17/2019  Time spent with the patient: 20 minutes, of which >50% was spent in obtaining information about symptoms, reviewing previous labs, evaluations, and treatments, counseling about condition (please see the discussed topics above), and developing a plan to further investigate it; had a number of questions which I addressed.

## 2019-03-06 ENCOUNTER — Encounter: Payer: Self-pay | Admitting: Physician Assistant

## 2019-03-06 MED ORDER — LISDEXAMFETAMINE DIMESYLATE 40 MG PO CAPS: 40.0000 mg | ORAL_CAPSULE | ORAL | 0 refills | Status: DC

## 2019-03-06 NOTE — Addendum Note (Signed)
Addended by: Brunetta Jeans on: 03/06/2019 11:31 AM   Modules accepted: Orders

## 2019-03-27 ENCOUNTER — Ambulatory Visit (INDEPENDENT_AMBULATORY_CARE_PROVIDER_SITE_OTHER): Payer: Medicare Other | Admitting: *Deleted

## 2019-03-27 DIAGNOSIS — I420 Dilated cardiomyopathy: Secondary | ICD-10-CM | POA: Diagnosis not present

## 2019-03-27 DIAGNOSIS — I5022 Chronic systolic (congestive) heart failure: Secondary | ICD-10-CM

## 2019-03-28 ENCOUNTER — Telehealth: Payer: Self-pay

## 2019-03-28 NOTE — Telephone Encounter (Signed)
Left message for patient to remind of missed remote transmission.  

## 2019-04-01 LAB — CUP PACEART REMOTE DEVICE CHECK
Battery Remaining Longevity: 41 mo
Battery Voltage: 2.94 V
Brady Statistic AP VP Percent: 0.66 %
Brady Statistic AP VS Percent: 0.03 %
Brady Statistic AS VP Percent: 98.66 %
Brady Statistic AS VS Percent: 0.65 %
Brady Statistic RA Percent Paced: 0.69 %
Brady Statistic RV Percent Paced: 99.11 %
Date Time Interrogation Session: 20200629015017
HighPow Impedance: 77 Ohm
Implantable Lead Implant Date: 20161114
Implantable Lead Implant Date: 20161114
Implantable Lead Implant Date: 20161114
Implantable Lead Location: 753858
Implantable Lead Location: 753859
Implantable Lead Location: 753860
Implantable Lead Model: 4598
Implantable Lead Model: 5076
Implantable Pulse Generator Implant Date: 20161114
Lead Channel Impedance Value: 361 Ohm
Lead Channel Impedance Value: 361 Ohm
Lead Channel Impedance Value: 361 Ohm
Lead Channel Impedance Value: 399 Ohm
Lead Channel Impedance Value: 418 Ohm
Lead Channel Impedance Value: 456 Ohm
Lead Channel Impedance Value: 532 Ohm
Lead Channel Impedance Value: 589 Ohm
Lead Channel Impedance Value: 608 Ohm
Lead Channel Impedance Value: 608 Ohm
Lead Channel Impedance Value: 874 Ohm
Lead Channel Impedance Value: 874 Ohm
Lead Channel Impedance Value: 893 Ohm
Lead Channel Pacing Threshold Amplitude: 0.5 V
Lead Channel Pacing Threshold Amplitude: 0.625 V
Lead Channel Pacing Threshold Amplitude: 0.875 V
Lead Channel Pacing Threshold Pulse Width: 0.4 ms
Lead Channel Pacing Threshold Pulse Width: 0.4 ms
Lead Channel Pacing Threshold Pulse Width: 0.4 ms
Lead Channel Sensing Intrinsic Amplitude: 0.875 mV
Lead Channel Sensing Intrinsic Amplitude: 0.875 mV
Lead Channel Sensing Intrinsic Amplitude: 25.25 mV
Lead Channel Sensing Intrinsic Amplitude: 25.25 mV
Lead Channel Setting Pacing Amplitude: 2 V
Lead Channel Setting Pacing Amplitude: 2.5 V
Lead Channel Setting Pacing Amplitude: 2.5 V
Lead Channel Setting Pacing Pulse Width: 0.4 ms
Lead Channel Setting Pacing Pulse Width: 0.4 ms
Lead Channel Setting Sensing Sensitivity: 0.45 mV

## 2019-04-07 ENCOUNTER — Encounter: Payer: Self-pay | Admitting: Cardiology

## 2019-04-07 NOTE — Progress Notes (Signed)
Remote ICD transmission.   

## 2019-04-21 ENCOUNTER — Other Ambulatory Visit: Payer: Self-pay | Admitting: Physician Assistant

## 2019-04-21 MED ORDER — LISDEXAMFETAMINE DIMESYLATE 40 MG PO CAPS: 40.0000 mg | ORAL_CAPSULE | ORAL | 0 refills | Status: DC

## 2019-04-21 NOTE — Telephone Encounter (Signed)
Last OV 01/17/19 vyvanse last filled 03/06/19 #30 with 0

## 2019-04-29 ENCOUNTER — Other Ambulatory Visit (HOSPITAL_COMMUNITY): Payer: Self-pay | Admitting: Internal Medicine

## 2019-04-30 DIAGNOSIS — L570 Actinic keratosis: Secondary | ICD-10-CM | POA: Diagnosis not present

## 2019-04-30 DIAGNOSIS — L821 Other seborrheic keratosis: Secondary | ICD-10-CM | POA: Diagnosis not present

## 2019-04-30 DIAGNOSIS — D1801 Hemangioma of skin and subcutaneous tissue: Secondary | ICD-10-CM | POA: Diagnosis not present

## 2019-04-30 DIAGNOSIS — D224 Melanocytic nevi of scalp and neck: Secondary | ICD-10-CM | POA: Diagnosis not present

## 2019-05-06 ENCOUNTER — Encounter: Payer: Self-pay | Admitting: Physician Assistant

## 2019-05-06 DIAGNOSIS — F329 Major depressive disorder, single episode, unspecified: Secondary | ICD-10-CM

## 2019-05-06 DIAGNOSIS — F419 Anxiety disorder, unspecified: Secondary | ICD-10-CM

## 2019-05-06 MED ORDER — BUPROPION HCL ER (XL) 150 MG PO TB24: ORAL_TABLET | ORAL | 3 refills | Status: DC

## 2019-06-02 ENCOUNTER — Encounter: Payer: Self-pay | Admitting: Physician Assistant

## 2019-06-02 MED ORDER — LISDEXAMFETAMINE DIMESYLATE 40 MG PO CAPS: 40.0000 mg | ORAL_CAPSULE | ORAL | 0 refills | Status: DC

## 2019-06-02 NOTE — Addendum Note (Signed)
Addended by: Brunetta Jeans on: 06/02/2019 04:48 PM   Modules accepted: Orders

## 2019-06-02 NOTE — Addendum Note (Signed)
Addended by: Brunetta Jeans on: 06/02/2019 02:50 PM   Modules accepted: Orders

## 2019-06-06 ENCOUNTER — Encounter

## 2019-06-26 ENCOUNTER — Encounter: Payer: Medicare Other | Admitting: *Deleted

## 2019-07-03 ENCOUNTER — Ambulatory Visit (INDEPENDENT_AMBULATORY_CARE_PROVIDER_SITE_OTHER): Payer: Medicare Other | Admitting: *Deleted

## 2019-07-03 DIAGNOSIS — I420 Dilated cardiomyopathy: Secondary | ICD-10-CM

## 2019-07-03 DIAGNOSIS — I5022 Chronic systolic (congestive) heart failure: Secondary | ICD-10-CM

## 2019-07-03 LAB — CUP PACEART REMOTE DEVICE CHECK
Battery Remaining Longevity: 35 mo
Battery Voltage: 2.95 V
Brady Statistic AP VP Percent: 0.38 %
Brady Statistic AP VS Percent: 0.03 %
Brady Statistic AS VP Percent: 99.03 %
Brady Statistic AS VS Percent: 0.56 %
Brady Statistic RA Percent Paced: 0.41 %
Brady Statistic RV Percent Paced: 99.22 %
Date Time Interrogation Session: 20201001021757
HighPow Impedance: 84 Ohm
Implantable Lead Implant Date: 20161114
Implantable Lead Implant Date: 20161114
Implantable Lead Implant Date: 20161114
Implantable Lead Location: 753858
Implantable Lead Location: 753859
Implantable Lead Location: 753860
Implantable Lead Model: 4598
Implantable Lead Model: 5076
Implantable Pulse Generator Implant Date: 20161114
Lead Channel Impedance Value: 361 Ohm
Lead Channel Impedance Value: 399 Ohm
Lead Channel Impedance Value: 418 Ohm
Lead Channel Impedance Value: 456 Ohm
Lead Channel Impedance Value: 456 Ohm
Lead Channel Impedance Value: 513 Ohm
Lead Channel Impedance Value: 532 Ohm
Lead Channel Impedance Value: 646 Ohm
Lead Channel Impedance Value: 703 Ohm
Lead Channel Impedance Value: 722 Ohm
Lead Channel Impedance Value: 893 Ohm
Lead Channel Impedance Value: 950 Ohm
Lead Channel Impedance Value: 950 Ohm
Lead Channel Pacing Threshold Amplitude: 0.625 V
Lead Channel Pacing Threshold Amplitude: 0.625 V
Lead Channel Pacing Threshold Amplitude: 0.875 V
Lead Channel Pacing Threshold Pulse Width: 0.4 ms
Lead Channel Pacing Threshold Pulse Width: 0.4 ms
Lead Channel Pacing Threshold Pulse Width: 0.4 ms
Lead Channel Sensing Intrinsic Amplitude: 2.375 mV
Lead Channel Sensing Intrinsic Amplitude: 2.375 mV
Lead Channel Sensing Intrinsic Amplitude: 26.25 mV
Lead Channel Sensing Intrinsic Amplitude: 26.25 mV
Lead Channel Setting Pacing Amplitude: 2 V
Lead Channel Setting Pacing Amplitude: 2.5 V
Lead Channel Setting Pacing Amplitude: 2.5 V
Lead Channel Setting Pacing Pulse Width: 0.4 ms
Lead Channel Setting Pacing Pulse Width: 0.4 ms
Lead Channel Setting Sensing Sensitivity: 0.45 mV

## 2019-07-10 ENCOUNTER — Encounter: Payer: Self-pay | Admitting: Cardiology

## 2019-07-10 NOTE — Progress Notes (Signed)
Remote ICD transmission.   

## 2019-07-18 ENCOUNTER — Encounter: Payer: Self-pay | Admitting: Physician Assistant

## 2019-07-18 NOTE — Telephone Encounter (Signed)
Last OV 01/17/19 vyvanse last filled 06/02/19 #30 with 0

## 2019-07-20 MED ORDER — LISDEXAMFETAMINE DIMESYLATE 40 MG PO CAPS: 40.0000 mg | ORAL_CAPSULE | ORAL | 0 refills | Status: DC

## 2019-07-28 ENCOUNTER — Ambulatory Visit: Payer: Medicare Other | Admitting: Cardiology

## 2019-07-28 ENCOUNTER — Encounter: Payer: Self-pay | Admitting: Cardiology

## 2019-07-28 ENCOUNTER — Other Ambulatory Visit: Payer: Self-pay

## 2019-07-28 VITALS — BP 132/90 | HR 70 | Ht 66.0 in | Wt 217.0 lb

## 2019-07-28 DIAGNOSIS — Z95810 Presence of automatic (implantable) cardiac defibrillator: Secondary | ICD-10-CM | POA: Diagnosis not present

## 2019-07-28 DIAGNOSIS — I428 Other cardiomyopathies: Secondary | ICD-10-CM | POA: Diagnosis not present

## 2019-07-28 DIAGNOSIS — I420 Dilated cardiomyopathy: Secondary | ICD-10-CM

## 2019-07-28 DIAGNOSIS — I5022 Chronic systolic (congestive) heart failure: Secondary | ICD-10-CM | POA: Diagnosis not present

## 2019-07-28 MED ORDER — LOSARTAN POTASSIUM 50 MG PO TABS: 50.0000 mg | ORAL_TABLET | Freq: Every day | ORAL | 1 refills | Status: DC

## 2019-07-28 NOTE — Patient Instructions (Signed)
Medication Instructions:   STOP TAKING LISINOPRIL NOW  START TAKING LOSARTAN 50 MG BY MOUTH DAILY  *If you need a refill on your cardiac medications before your next appointment, please call your pharmacy*   Lab Work:  TODAY--CMET, CBC W DIFF, PRO-BNP, AND TSH  If you have labs (blood work) drawn today and your tests are completely normal, you will receive your results only by: Marland Kitchen MyChart Message (if you have MyChart) OR . A paper copy in the mail If you have any lab test that is abnormal or we need to change your treatment, we will call you to review the results.    Follow-Up: At [facility], you and your health needs are our priority.  As part of our continuing mission to provide you with exceptional heart care, we have created designated Provider Care Teams.  These Care Teams include your primary Cardiologist (physician) and Advanced Practice Providers (APPs -  Physician Assistants and Nurse Practitioners) who all work together to provide you with the care you need, when you need it.  Your next appointment:   6 months  The format for your next appointment:   In Person  Provider:   Ena Dawley, MD  Other Instructions

## 2019-07-28 NOTE — Progress Notes (Signed)
Cardiology Office Note:    Date:  07/28/2019   ID:  Debra Holloway, DOB 28-Mar-1956, MRN [id number]  PCP:  Brunetta Jeans, PA-C  Cardiologist:  No primary care provider on file.  Electrophysiologist:  None   Referring MD: Jolaine Artist, MD   Reason for visit: CHF  History of Present Illness:    Debra Holloway is a 63 y.o. female with a hx of ADD, LBBB and depression. She was admitted to the hospital on 11/14/2014 with ADHF.  Echocardiogram demonstrated LVEF15-20%. Underwent LHC which showed normal coronaries.   She underwent implantation of Medtronic CRT-D by Dr. Caryl Comes in November 2016. She has been followed by Dr. Haroldine Laws last seen in March 2020 with echo showing EF 50-55%.  She was doing well at that time.   ICD was interrogated then and showed increase fluid impedance.  Frequent short spurts of AF on device. No VT/VF. Active 2-3 hours/day.   07/28/2019 - she is coming after 6 months, she has been experiencing more lower extremity couple weeks and mild shortness of breath.  She still gets occasional very short lasting palpitations with episodes of dizziness/syncope.  She has also been experiencing nagging dry cough.  01/20/2015: CMRI- EF 29% RVEF 51% + dyssynchrony. No scar or infiltrative disease.  Echo (6/16):  EF ~30% with significant dyssynchrony, moderate MR Echo (9/16):  EF 25-30%, Grade 1 DD ECHO 01/2016: EF 45-50%.  Echo 8/18: EF 50-55%  Echo 3/19: EF 55%  Past Medical History:  Diagnosis Date  . ADD (attention deficit disorder)   . AICD (automatic cardioverter/defibrillator) present Sep 09, 2015  . Anxiety    "just when my daddy died a few years ago" (2015/09/09)  . AR (allergic rhinitis)   . CHF (congestive heart failure) ([place]) dx'd 11/2014  . Chicken pox   . Depression    "just when my daddy died a few years ago" (2015-09-09)  . Dyslipidemia   . Environmental allergies   . H/O vitamin D deficiency   . LBBB (left bundle branch block)    W/1st degree  AV Block, Avoid Beta Blockers  . OSA (obstructive sleep apnea) 03/18/2015   Severe OSA with AHI 30/hr; "suppose to wear mask but don't" (Sep 09, 2015)  . Seasonal affective disorder [facility])     Past Surgical History:  Procedure Laterality Date  . CARDIAC CATHETERIZATION    . DILATION AND CURETTAGE OF UTERUS  2003  . EP IMPLANTABLE DEVICE N/A September 09, 2015   Procedure: BiV ICD Insertion CRT-D;  Surgeon: Deboraha Sprang, MD;  Location: [place] CV LAB;  Service: Cardiovascular;  Laterality: N/A;  . LEFT AND RIGHT HEART CATHETERIZATION WITH CORONARY ANGIOGRAM N/A 11/17/2014   Procedure: LEFT AND RIGHT HEART CATHETERIZATION WITH CORONARY ANGIOGRAM;  Surgeon: Troy Sine, MD;  Location: [facility] CATH LAB;  Service: Cardiovascular;  Laterality: N/A;  . TOTAL ABDOMINAL HYSTERECTOMY  2004  . TUBAL LIGATION    . WISDOM TOOTH EXTRACTION  1986    Current Medications: Current Meds  Medication Sig  . acetaminophen (TYLENOL) 500 MG tablet Take 500 mg by mouth every 6 (six) hours as needed for moderate pain.  Marland Kitchen b complex vitamins tablet Take 1 tablet by mouth daily.  Marland Kitchen buPROPion (WELLBUTRIN XL) 150 MG 24 hr tablet TAKE 1 TABLET(150 MG) BY MOUTH DAILY  . carvedilol (COREG) 12.5 MG tablet TAKE 1 TABLET BY MOUTH TWICE DAILY WITH A MEAL  . Cholecalciferol (VITAMIN D PO) Take 1 tablet by mouth daily.  . fexofenadine (ALLEGRA) 180  MG tablet Take 1 tablet (180 mg total) by mouth daily.  . fluticasone (FLONASE) 50 MCG/ACT nasal spray Place 2 sprays into both nostrils daily.  . furosemide (LASIX) 20 MG tablet TAKE 1 TABLET(20 MG) BY MOUTH DAILY AS NEEDED  . lisdexamfetamine (VYVANSE) 40 MG capsule Take 1 capsule (40 mg total) by mouth every morning.  . mometasone (NASONEX) 50 MCG/ACT nasal spray Place 2 sprays into the nose daily. (Patient taking differently: Place 2 sprays into the nose as needed. )  . potassium chloride (K-DUR,KLOR-CON) 10 MEQ tablet TAKE 2 TABLETS( 20 MEQ) BY MOUTH AS NEEDED WHEN TAKING LASIX(  FUROSEMIDE)  . spironolactone (ALDACTONE) 25 MG tablet TAKE 1 TABLET(25 MG) BY MOUTH DAILY  . [DISCONTINUED] lisinopril (ZESTRIL) 10 MG tablet TAKE 1 TABLET(10 MG) BY MOUTH AT BEDTIME     Allergies:   Bee venom, Erythromycin, Lovastatin, Penicillins, and Simvastatin   Social History   Socioeconomic History  . Marital status: Divorced    Spouse name: Not on file  . Number of children: Not on file  . Years of education: Not on file  . Highest education level: Not on file  Occupational History  . Not on file  Social Needs  . Financial resource strain: Not on file  . Food insecurity    Worry: Not on file    Inability: Not on file  . Transportation needs    Medical: Not on file    Non-medical: Not on file  Tobacco Use  . Smoking status: Never Smoker  . Smokeless tobacco: Never Used  Substance and Sexual Activity  . Alcohol use: Yes    Alcohol/week: 4.0 standard drinks    Types: 4 Glasses of wine per week  . Drug use: No  . Sexual activity: Not Currently  Lifestyle  . Physical activity    Days per week: Not on file    Minutes per session: Not on file  . Stress: Not on file  Relationships  . Social Herbalist on phone: Not on file    Gets together: Not on file    Attends religious service: Not on file    Active member of club or organization: Not on file    Attends meetings of clubs or organizations: Not on file    Relationship status: Not on file  Other Topics Concern  . Not on file  Social History Narrative  . Not on file     Family History: The patient's family history includes Alzheimer's disease in her maternal grandfather; Arthritis/Rheumatoid in her maternal grandmother; Breast cancer (age of onset: 85) in her sister; COPD in her mother; Cancer in her paternal uncle; Healthy in her brother and daughter; Heart disease in her maternal grandmother and paternal grandmother; Hypertension in her paternal grandmother; Lung cancer in her paternal grandfather;  Pancreatic cancer (age of onset: 58) in her father.  ROS:   Please see the history of present illness.    All other systems reviewed and are negative.  EKGs/Labs/Other Studies Reviewed:    The following studies were reviewed today: A-sensed BiV paced rhythm  EKG:  EKG is ordered today.  The ekg ordered today demonstrates A-sensed BiV paced rhythm  Recent Labs: 07/30/2018: ALT 31; BUN 18; Creatinine, Ser 0.88; Hemoglobin 13.8; Platelets 252.0; Potassium 4.6; Sodium 138; TSH 2.16  Recent Lipid Panel    Component Value Date/Time   CHOL 222 (H) 07/30/2018 1356   TRIG 122.0 07/30/2018 1356   HDL 61.70 07/30/2018 1356  CHOLHDL 4 07/30/2018 1356   VLDL 24.4 07/30/2018 1356   LDLCALC 136 (H) 07/30/2018 1356    Physical Exam:    VS:  BP 132/90   Pulse 70   Ht 5' 6"  (1.676 m)   Wt 217 lb (98.4 kg)   BMI 35.02 kg/m     Wt Readings from Last 3 Encounters:  07/28/19 217 lb (98.4 kg)  01/17/19 214 lb (97.1 kg)  12/02/18 215 lb (97.5 kg)     GEN:  Well nourished, well developed in no acute distress HEENT: Normal NECK: No JVD; No carotid bruits LYMPHATICS: No lymphadenopathy CARDIAC: RRR, no murmurs, rubs, gallops RESPIRATORY:  Clear to auscultation without rales, wheezing or rhonchi  ABDOMEN: Soft, non-tender, non-distended MUSCULOSKELETAL:  No edema; No deformity  SKIN: Warm and dry NEUROLOGIC:  Alert and oriented x 3 PSYCHIATRIC:  Normal affect   ASSESSMENT:    1. Nonischemic dilated cardiomyopathy ([place])   2. NICM (nonischemic cardiomyopathy) ([place])   3. Chronic systolic heart failure ([place])   4. ICD (implantable cardioverter-defibrillator) in place    PLAN:    In order of problems listed above:  1) Chronic systolic HF: NICM . EF 25-30% by echo in 9/16, stable from 6/16 of about 30%. Normal coronaries on cath. Possible viral CMP versus LBBB. cMRI in 4/16 did not show evidence for infiltrative disease.  Has Mectronic Optivol CRT-D  ECHO from April 2017 with improved  EF 45-50%.  - Echo 8/18 EF 50-55%.  - EF improved after CRT-D.  - Echo today EF 50-55% - Volume status slightly worse on physical exam, mild lower extremity edema, she is instructed to take Lasix 40 daily we will check BMP and BNP, however OptiVol impedance from interrogation from her biventricular pacemaker today shows normal impedance.   -I will switch lisinopril to losartan 50 mg p.o. daily as she has been having dry cough, continue spiro, and carvedilol.  - BMET, BNP today  2) CP - Cath 2016 with normal coronaries.  - No recent CP.   3) LBBB: Chronic. S/p CRT-D. No change.  3) OSA: Mild. Unable to tolerate CPAP. No change.  4) ADD and depression: Per PCP 5) Dizziness 6) Short runs of AF -Interrogated today, functioning well, normal impedance on OptiVol.  Follow-up in 3 months.  Medication Adjustments/Labs and Tests Ordered: Current medicines are reviewed at length with the patient today.  Concerns regarding medicines are outlined above.  Orders Placed This Encounter  Procedures  . CBC w/Diff  . Comp Met (CMET)  . Pro b natriuretic peptide  . TSH  . EKG 12-Lead   Meds ordered this encounter  Medications  . losartan (COZAAR) 50 MG tablet    Sig: Take 1 tablet (50 mg total) by mouth daily.    Dispense:  90 tablet    Refill:  1    Patient Instructions  Medication Instructions:   STOP TAKING LISINOPRIL NOW  START TAKING LOSARTAN 50 MG BY MOUTH DAILY  *If you need a refill on your cardiac medications before your next appointment, please call your pharmacy*   Lab Work:  TODAY--CMET, CBC W DIFF, PRO-BNP, AND TSH  If you have labs (blood work) drawn today and your tests are completely normal, you will receive your results only by: Marland Kitchen MyChart Message (if you have MyChart) OR . A paper copy in the mail If you have any lab test that is abnormal or we need to change your treatment, we will call you to review the results.  Follow-Up: At [facility], you and your  health needs are our priority.  As part of our continuing mission to provide you with exceptional heart care, we have created designated Provider Care Teams.  These Care Teams include your primary Cardiologist (physician) and Advanced Practice Providers (APPs -  Physician Assistants and Nurse Practitioners) who all work together to provide you with the care you need, when you need it.  Your next appointment:   6 months  The format for your next appointment:   In Person  Provider:   Ena Dawley, MD  Other Instructions      Signed, Ena Dawley, MD  07/28/2019 3:06 PM    [place]

## 2019-07-29 ENCOUNTER — Telehealth: Payer: Self-pay | Admitting: Cardiology

## 2019-07-29 LAB — COMPREHENSIVE METABOLIC PANEL
ALT: 16 IU/L (ref 0–32)
AST: 19 IU/L (ref 0–40)
Albumin/Globulin Ratio: 1.4 (ref 1.2–2.2)
Albumin: 4 g/dL (ref 3.8–4.8)
Alkaline Phosphatase: 69 IU/L (ref 39–117)
BUN/Creatinine Ratio: 15 (ref 12–28)
BUN: 13 mg/dL (ref 8–27)
Bilirubin Total: 0.3 mg/dL (ref 0.0–1.2)
CO2: 24 mmol/L (ref 20–29)
Calcium: 9.5 mg/dL (ref 8.7–10.3)
Chloride: 104 mmol/L (ref 96–106)
Creatinine, Ser: 0.89 mg/dL (ref 0.57–1.00)
GFR calc Af Amer: 81 mL/min/{1.73_m2} (ref >59)
GFR calc non Af Amer: 70 mL/min/{1.73_m2} (ref >59)
Globulin, Total: 2.9 g/dL (ref 1.5–4.5)
Glucose: 89 mg/dL (ref 65–99)
Potassium: 4.5 mmol/L (ref 3.5–5.2)
Sodium: 140 mmol/L (ref 134–144)
Total Protein: 6.9 g/dL (ref 6.0–8.5)

## 2019-07-29 LAB — CBC WITH DIFFERENTIAL/PLATELET
Basophils Absolute: 0.1 10*3/uL (ref 0.0–0.2)
Basos: 1 %
EOS (ABSOLUTE): 0.1 10*3/uL (ref 0.0–0.4)
Eos: 1 %
Hematocrit: 39.7 % (ref 34.0–46.6)
Hemoglobin: 13 g/dL (ref 11.1–15.9)
Immature Grans (Abs): 0.1 10*3/uL (ref 0.0–0.1)
Immature Granulocytes: 1 %
Lymphocytes Absolute: 2.7 10*3/uL (ref 0.7–3.1)
Lymphs: 33 %
MCH: 29.9 pg (ref 26.6–33.0)
MCHC: 32.7 g/dL (ref 31.5–35.7)
MCV: 91 fL (ref 79–97)
Monocytes Absolute: 0.6 10*3/uL (ref 0.1–0.9)
Monocytes: 7 %
Neutrophils Absolute: 4.7 10*3/uL (ref 1.4–7.0)
Neutrophils: 57 %
Platelets: 245 10*3/uL (ref 150–450)
RBC: 4.35 x10E6/uL (ref 3.77–5.28)
RDW: 12.6 % (ref 11.7–15.4)
WBC: 8.2 10*3/uL (ref 3.4–10.8)

## 2019-07-29 LAB — TSH: TSH: 1.31 u[IU]/mL (ref 0.450–4.500)

## 2019-07-29 LAB — PRO B NATRIURETIC PEPTIDE: NT-Pro BNP: 212 pg/mL (ref 0–287)

## 2019-07-29 MED ORDER — FUROSEMIDE 40 MG PO TABS: ORAL_TABLET | ORAL | 3 refills | Status: DC

## 2019-07-29 NOTE — Telephone Encounter (Signed)
-----   Message from Dorothy Spark, MD sent at 07/29/2019 12:49 PM EDT ----- Her labs are normal including normal BNP suggestive of no fluid overload this is consistent with reading on her ICD.  I would instruct to take Lasix 40 for next 2 days and then just as needed.

## 2019-07-29 NOTE — Telephone Encounter (Signed)
New Message  Patient is concerned with her results and would like instruction on how to take the Lasix based on her BNP results. Please give patient a call back to discuss.

## 2019-07-29 NOTE — Telephone Encounter (Signed)
Spoke with the pt and informed her of her lab results and recommendations per Dr. Meda Coffee. Informed the pt that per Dr Meda Coffee, she recommends that she take lasix 40 mg by mouth daily x 2 days, then take lasix 40 mg by mouth daily as needed for lower extremity edema, sob, and wt gain, thereafter.  Went over weight gain parameters with the pt.  Confirmed the pharmacy of choice with the pt.  Pt verbalized understanding and agrees with this plan.

## 2019-08-11 NOTE — Telephone Encounter (Signed)
Please have her stop losartan and see if her sleep and dry cough improves, have her call us in 1 week. Thank you, KN   Mycharted the pt that we will d/c her losartan and she needs to call us or mychart [place] in one week, to report how her cough and sleep improve or not.  This was ordered per Dr. Meda Coffee. Endorsed to the pt that I will d/c this out of her med list and update this in allergies as an intolerance.

## 2019-08-20 ENCOUNTER — Encounter: Payer: Self-pay | Admitting: Physician Assistant

## 2019-08-20 MED ORDER — LISDEXAMFETAMINE DIMESYLATE 40 MG PO CAPS: 40.0000 mg | ORAL_CAPSULE | ORAL | 0 refills | Status: DC

## 2019-08-20 MED ORDER — LISINOPRIL 10 MG PO TABS: 10.0000 mg | ORAL_TABLET | Freq: Every day | ORAL | 3 refills | Status: DC

## 2019-08-20 NOTE — Telephone Encounter (Signed)
Vyvanse last rx 07/20/19 #30 LOV: 01/17/19 Chol, ADHD

## 2019-08-20 NOTE — Telephone Encounter (Signed)
Dr. Meda Coffee, we stopped this pts lisinopril 10 mg po at bedtime at her last OV with you, and started her on losartan instead.  She has now stopped her losartan and restarted her lisinopril, and states her headache is gone. She is also on carvedilol 12.5 mg po bid. Please advise if you would like for her to go back to her old regimen of lisinopril 10 mg po at bedtime, discontinue her losartan, and continue her coreg.

## 2019-08-20 NOTE — Telephone Encounter (Signed)
Dorothy Spark, MD  Nuala Alpha, LPN        Lisinopril is okay if she can tolerate it.      Per Dr. Meda Coffee, pt can go back on old regimen of lisinopril 10 mg po daily, if she can tolerate this again.  Advised the pt of this and to stop losartan as instructed on 11/9.  Sent in refills of this med to the pts pharmacy of choice.  Pt made aware of this via mychart message.

## 2019-09-23 ENCOUNTER — Encounter: Payer: Self-pay | Admitting: Physician Assistant

## 2019-09-24 ENCOUNTER — Other Ambulatory Visit: Payer: Self-pay

## 2019-09-24 MED ORDER — LISDEXAMFETAMINE DIMESYLATE 40 MG PO CAPS: 40.0000 mg | ORAL_CAPSULE | ORAL | 0 refills | Status: DC

## 2019-09-24 NOTE — Telephone Encounter (Signed)
Last refill: 11.18.20 #30, 0 Last OV: 4.17.20 dx. ADD

## 2019-10-01 ENCOUNTER — Encounter: Payer: Self-pay | Admitting: Physician Assistant

## 2019-10-02 ENCOUNTER — Encounter: Payer: Self-pay | Admitting: Physician Assistant

## 2019-10-06 ENCOUNTER — Ambulatory Visit: Payer: Medicare Other | Attending: Internal Medicine

## 2019-10-06 DIAGNOSIS — Z20822 Contact with and (suspected) exposure to covid-19: Secondary | ICD-10-CM | POA: Diagnosis not present

## 2019-10-07 ENCOUNTER — Encounter: Payer: Self-pay | Admitting: Physician Assistant

## 2019-10-07 LAB — NOVEL CORONAVIRUS, NAA: SARS-CoV-2, NAA: NOT DETECTED

## 2019-11-07 ENCOUNTER — Encounter: Payer: Self-pay | Admitting: Physician Assistant

## 2019-11-10 ENCOUNTER — Encounter: Payer: Self-pay | Admitting: Physician Assistant

## 2019-11-10 ENCOUNTER — Other Ambulatory Visit: Payer: Self-pay

## 2019-11-10 ENCOUNTER — Ambulatory Visit (INDEPENDENT_AMBULATORY_CARE_PROVIDER_SITE_OTHER): Payer: Medicare Other | Admitting: Physician Assistant

## 2019-11-10 ENCOUNTER — Other Ambulatory Visit: Payer: Self-pay | Admitting: Physician Assistant

## 2019-11-10 DIAGNOSIS — F988 Other specified behavioral and emotional disorders with onset usually occurring in childhood and adolescence: Secondary | ICD-10-CM

## 2019-11-10 MED ORDER — LISDEXAMFETAMINE DIMESYLATE 40 MG PO CAPS: 40.0000 mg | ORAL_CAPSULE | ORAL | 0 refills | Status: DC

## 2019-11-10 NOTE — Progress Notes (Signed)
I have discussed the procedure for the virtual visit with the patient who has given consent to proceed with assessment and treatment.   Miriam Liles S Jeancarlo Leffler, CMA     

## 2019-11-10 NOTE — Progress Notes (Signed)
Virtual Visit via Video   I connected with patient on 11/10/19 at  1:30 PM EST by a video enabled telemedicine application and verified that I am speaking with the correct person using two identifiers.  Location patient: Home Location provider: [place], Office Persons participating in the virtual visit: Patient, Provider, [place] (Patina Moore)  I discussed the limitations of evaluation and management by telemedicine and the availability of in person appointments. The patient expressed understanding and agreed to proceed.  Subjective:   HPI:   Patient presents via DOxy.Me today for follow-up of ADD. Has been on a longstanding regimen of Vyvanse 40 mg once daily.  Endorses still taking as directed, continuing to tolerate well.  Notes good control over her symptoms.  Is sleeping well at night.  Notes blood pressure and heart rate stable at home.  Is following with cardiology as scheduled.  No new concerns today.  ROS:   See pertinent positives and negatives per HPI.  Patient Active Problem List   Diagnosis Date Noted  . Anxiety and depression 09/21/2016  . Visit for preventive health examination 06/18/2016  . NICM (nonischemic cardiomyopathy) ([place]) 08/16/2015  . OSA (obstructive sleep apnea) 03/18/2015  . Chronic systolic heart failure ([place]) 12/07/2014  . Nonischemic dilated cardiomyopathy ([place])   . LBBB (left bundle branch block)   . Dyslipidemia   . Hot flashes, menopausal 09/18/2014  . ADD (attention deficit disorder) 09/06/2013  . Environmental allergies 09/06/2013    Social History   Tobacco Use  . Smoking status: Never Smoker  . Smokeless tobacco: Never Used  Substance Use Topics  . Alcohol use: Yes    Alcohol/week: 4.0 standard drinks    Types: 4 Glasses of wine per week    Current Outpatient Medications:  .  acetaminophen (TYLENOL) 500 MG tablet, Take 500 mg by mouth every 6 (six) hours as needed for moderate pain., Disp: , Rfl:  .  b complex vitamins  tablet, Take 1 tablet by mouth daily., Disp: , Rfl:  .  buPROPion (WELLBUTRIN XL) 150 MG 24 hr tablet, TAKE 1 TABLET(150 MG) BY MOUTH DAILY, Disp: 30 tablet, Rfl: 3 .  carvedilol (COREG) 12.5 MG tablet, TAKE 1 TABLET BY MOUTH TWICE DAILY WITH A MEAL, Disp: 60 tablet, Rfl: 6 .  Cholecalciferol (VITAMIN D PO), Take 2,000 mg by mouth daily. , Disp: , Rfl:  .  fexofenadine (ALLEGRA) 180 MG tablet, Take 1 tablet (180 mg total) by mouth daily., Disp: , Rfl:  .  fluticasone (FLONASE) 50 MCG/ACT nasal spray, Place 2 sprays into both nostrils daily., Disp: , Rfl:  .  furosemide (LASIX) 40 MG tablet, Take 40 mg by mouth daily x 2 days, then take 40 mg by mouth daily as needed for swelling, sob, or wt gain thereafter., Disp: 30 tablet, Rfl: 3 .  lisdexamfetamine (VYVANSE) 40 MG capsule, Take 1 capsule (40 mg total) by mouth every morning. Need follow-up for further fill., Disp: 15 capsule, Rfl: 0 .  lisinopril (ZESTRIL) 10 MG tablet, Take 1 tablet (10 mg total) by mouth daily., Disp: 30 tablet, Rfl: 3 .  mometasone (NASONEX) 50 MCG/ACT nasal spray, Place 2 sprays into the nose daily. (Patient taking differently: Place 2 sprays into the nose as needed. ), Disp: 17 g, Rfl: 12 .  potassium chloride (K-DUR,KLOR-CON) 10 MEQ tablet, TAKE 2 TABLETS( 20 MEQ) BY MOUTH AS NEEDED WHEN TAKING LASIX( FUROSEMIDE), Disp: 30 tablet, Rfl: 6 .  spironolactone (ALDACTONE) 25 MG tablet, TAKE 1 TABLET(25 MG)  BY MOUTH DAILY, Disp: 30 tablet, Rfl: 11  Allergies  Allergen Reactions  . Bee Venom Anaphylaxis  . Losartan Other (See Comments)    Pt reports cough and insomnia.  . Erythromycin Itching and Rash  . Lovastatin Other (See Comments)    weakness  . Penicillins Hives, Itching and Rash  . Simvastatin Other (See Comments)    weakness    Objective:   There were no vitals taken for this visit.  Patient is well-developed, well-nourished in no acute distress.  Resting comfortably at home.  Head is normocephalic,  atraumatic.  No labored breathing.  Speech is clear and coherent with logical content.  Patient is alert and oriented at baseline.   Assessment and Plan:      1. Attention deficit disorder, unspecified hyperactivity presence Stable.  Continue current regimen.  Medications refilled.  She is to schedule CPE as this is overdue. - lisdexamfetamine (VYVANSE) 40 MG capsule; Take 1 capsule (40 mg total) by mouth every morning. Need follow-up for further fill.  Dispense: 30 capsule; Refill: 0 .   Leeanne Rio, [place] 11/10/2019

## 2019-12-02 ENCOUNTER — Telehealth: Payer: Self-pay

## 2019-12-02 NOTE — Telephone Encounter (Signed)
Called patient and informed her of PCP recommendations. She states she is not experiencing any chills, fever, aches, or ear swelling. Patient states she will keep an eye on this and let us know if anything develops.

## 2019-12-02 NOTE — Telephone Encounter (Signed)
Patient reports left ear pain since Friday. Patient thought it was fluid. Patient pulled out a tick which made her ear bleed. Patient is concerned and wondering if she needs to do anything else. Ear is feeling better.

## 2019-12-02 NOTE — Telephone Encounter (Signed)
Keep ear canal clean and dry. If bleeding has stopped and the full tick was removed we should be ok. Is she having any chills, fever, aches, ear swelling, etc? If not, monitor for this and let us know if any of these develop.

## 2019-12-15 ENCOUNTER — Encounter: Payer: Self-pay | Admitting: Physician Assistant

## 2019-12-15 ENCOUNTER — Other Ambulatory Visit: Payer: Self-pay | Admitting: Emergency Medicine

## 2019-12-15 DIAGNOSIS — F988 Other specified behavioral and emotional disorders with onset usually occurring in childhood and adolescence: Secondary | ICD-10-CM

## 2019-12-15 MED ORDER — LISDEXAMFETAMINE DIMESYLATE 40 MG PO CAPS: 40.0000 mg | ORAL_CAPSULE | ORAL | 0 refills | Status: DC

## 2019-12-15 NOTE — Telephone Encounter (Signed)
Vyvanse last rx 11/10/19 #30 LOV: 11/10/19 ADD

## 2020-01-01 ENCOUNTER — Ambulatory Visit (INDEPENDENT_AMBULATORY_CARE_PROVIDER_SITE_OTHER): Payer: Medicare Other | Admitting: *Deleted

## 2020-01-01 DIAGNOSIS — I420 Dilated cardiomyopathy: Secondary | ICD-10-CM

## 2020-01-01 LAB — CUP PACEART REMOTE DEVICE CHECK
Battery Remaining Longevity: 30 mo
Battery Voltage: 2.94 V
Brady Statistic AP VP Percent: 0.52 %
Brady Statistic AP VS Percent: 0.03 %
Brady Statistic AS VP Percent: 99.04 %
Brady Statistic AS VS Percent: 0.42 %
Brady Statistic RA Percent Paced: 0.54 %
Brady Statistic RV Percent Paced: 99.4 %
Date Time Interrogation Session: 20210401033424
HighPow Impedance: 79 Ohm
Implantable Lead Implant Date: 20161114
Implantable Lead Implant Date: 20161114
Implantable Lead Implant Date: 20161114
Implantable Lead Location: 753858
Implantable Lead Location: 753859
Implantable Lead Location: 753860
Implantable Lead Model: 4598
Implantable Lead Model: 5076
Implantable Pulse Generator Implant Date: 20161114
Lead Channel Impedance Value: 361 Ohm
Lead Channel Impedance Value: 361 Ohm
Lead Channel Impedance Value: 399 Ohm
Lead Channel Impedance Value: 418 Ohm
Lead Channel Impedance Value: 418 Ohm
Lead Channel Impedance Value: 456 Ohm
Lead Channel Impedance Value: 551 Ohm
Lead Channel Impedance Value: 589 Ohm
Lead Channel Impedance Value: 646 Ohm
Lead Channel Impedance Value: 703 Ohm
Lead Channel Impedance Value: 874 Ohm
Lead Channel Impedance Value: 893 Ohm
Lead Channel Impedance Value: 950 Ohm
Lead Channel Pacing Threshold Amplitude: 0.625 V
Lead Channel Pacing Threshold Amplitude: 0.625 V
Lead Channel Pacing Threshold Amplitude: 1 V
Lead Channel Pacing Threshold Pulse Width: 0.4 ms
Lead Channel Pacing Threshold Pulse Width: 0.4 ms
Lead Channel Pacing Threshold Pulse Width: 0.4 ms
Lead Channel Sensing Intrinsic Amplitude: 2.125 mV
Lead Channel Sensing Intrinsic Amplitude: 2.125 mV
Lead Channel Sensing Intrinsic Amplitude: 26.375 mV
Lead Channel Sensing Intrinsic Amplitude: 26.375 mV
Lead Channel Setting Pacing Amplitude: 2 V
Lead Channel Setting Pacing Amplitude: 2.5 V
Lead Channel Setting Pacing Amplitude: 2.5 V
Lead Channel Setting Pacing Pulse Width: 0.4 ms
Lead Channel Setting Pacing Pulse Width: 0.4 ms
Lead Channel Setting Sensing Sensitivity: 0.45 mV

## 2020-01-02 NOTE — Progress Notes (Signed)
ICD Remote  

## 2020-01-26 ENCOUNTER — Encounter: Payer: Self-pay | Admitting: Physician Assistant

## 2020-01-26 NOTE — Progress Notes (Signed)
Cardiology Office Note    Date:  01/29/2020   ID:  Debra Holloway, DOB 1956-01-04, MRN [id number]  PCP:  Brunetta Jeans, PA-C  Cardiologist:  Ena Dawley, MD  Electrophysiologist:  Virl Axe, MD   Chief Complaint: f/u CHF  History of Present Illness:   Debra Holloway is a 64 y.o. female with history of NICM, chronic combined CHF, LBBB, depression, anxiety, ADD, dyslipidemia, untreated OSA, subclinical AF, seasonal affective disorder who presents for 6 month follow-up.   She was admitted to the hospital in 2016 with acute heart failure.Echocardiogram demonstrated LVEF 15-20%. She underwent LHC which showed normal coronaries.CMRI in 2016 showed EF 29% RVEF 51% + dyssynchrony, no scar or infiltrative disease. Her cardiomyopathy was felt possibly viral versus LBBB-mediated. She underwent implantation of Medtronic CRT-D by Dr. Caryl Comes in November 2016. She followed with Dr. Haroldine Laws then graduated from [facility] clinic back to general cardiology with Dr. Meda Coffee. Last echo 12/2018 showed EF 55-60% in the body of the report but noting in the text that EF was 50 to 55% with septal hypokinesis and aneurysmal dilitation of inferoapex with hypokinesis in that region. She did not tolerate losartan due to headaches. She previously had a dry cough with lisinopril but this has resolved. Regarding AF, last CHF note indicated short runs of AF. [place] not mentioned in last cardiology note. The last device interrogation that noted this was in 03/2019 reporting the longest episode was 1.54 minutes. She has OSA but has been unable to tolerate any kind of treatment for this including nasal prong due to her anxiety. Last labs 07/2019 showed normal TSH, pBNP, CMET, CBC, 2019 LDL 136.  She returns for follow-up today. I am meeting her for the first time. She reports a gradual smoldering sense of generalized fatigue that has been ongoing for several years. She is tearful about this. She states she can exert herself with  mild shortness of breath that goes away, but then she feels totally wiped out the rest of the day as if she's covered in cement. She has rare "gallops" of her heartbeat but this is generally controlled. No angina or edema. She reports chronic orthopnea. She also has increased her allergy medications due to the pollen. She also stopped taking her vitamin D because of increasing her outdoor exposure during the spring, although does report previous deficiency.   Past Medical History:  Diagnosis Date  . ADD (attention deficit disorder)   . AICD (automatic cardioverter/defibrillator) present 02-Sep-2015  . Anxiety    "just when my daddy died a few years ago" (2015-09-02)  . AR (allergic rhinitis)   . Chicken pox   . Chronic combined systolic and diastolic CHF (congestive heart failure) ([place])   . Depression    "just when my daddy died a few years ago" (02-Sep-2015)  . Dyslipidemia   . Environmental allergies   . H/O vitamin D deficiency   . LBBB (left bundle branch block)    W/1st degree AV Block, Avoid Beta Blockers  . NICM (nonischemic cardiomyopathy) ([place])   . OSA (obstructive sleep apnea) 03/18/2015   Severe OSA with AHI 30/hr; "suppose to wear mask but don't" (09-02-15)  . Seasonal affective disorder [facility])     Past Surgical History:  Procedure Laterality Date  . CARDIAC CATHETERIZATION    . DILATION AND CURETTAGE OF UTERUS  2003  . EP IMPLANTABLE DEVICE N/A 2015-09-02   Procedure: BiV ICD Insertion CRT-D;  Surgeon: Deboraha Sprang, MD;  Location: [facility]  CV LAB;  Service: Cardiovascular;  Laterality: N/A;  . LEFT AND RIGHT HEART CATHETERIZATION WITH CORONARY ANGIOGRAM N/A 11/17/2014   Procedure: LEFT AND RIGHT HEART CATHETERIZATION WITH CORONARY ANGIOGRAM;  Surgeon: Troy Sine, MD;  Location: [facility] CATH LAB;  Service: Cardiovascular;  Laterality: N/A;  . TOTAL ABDOMINAL HYSTERECTOMY  2004  . TUBAL LIGATION    . WISDOM TOOTH EXTRACTION  1986    Current Medications: Current Meds   Medication Sig  . acetaminophen (TYLENOL) 500 MG tablet Take 500 mg by mouth every 6 (six) hours as needed for moderate pain.  Marland Kitchen b complex vitamins tablet Take 1 tablet by mouth daily.  Marland Kitchen buPROPion (WELLBUTRIN XL) 150 MG 24 hr tablet TAKE 1 TABLET(150 MG) BY MOUTH DAILY  . carvedilol (COREG) 12.5 MG tablet TAKE 1 TABLET BY MOUTH TWICE DAILY WITH A MEAL  . Cholecalciferol (VITAMIN D PO) Take 2,000 mg by mouth daily.   . fexofenadine (ALLEGRA) 180 MG tablet Take 1 tablet (180 mg total) by mouth daily.  . furosemide (LASIX) 40 MG tablet Take 40 mg by mouth as needed.  Marland Kitchen lisdexamfetamine (VYVANSE) 40 MG capsule Take 1 capsule (40 mg total) by mouth every morning.  Marland Kitchen lisinopril (ZESTRIL) 10 MG tablet Take 1 tablet (10 mg total) by mouth daily.  . mometasone (NASONEX) 50 MCG/ACT nasal spray Place 2 sprays into the nose daily.  . potassium chloride (KLOR-CON) 10 MEQ tablet Take 10 mEq by mouth as needed (when take furosemide).  Marland Kitchen spironolactone (ALDACTONE) 25 MG tablet TAKE 1 TABLET(25 MG) BY MOUTH DAILY      Allergies:   Bee venom, Losartan, Erythromycin, Lovastatin, Penicillins, and Simvastatin   Social History   Socioeconomic History  . Marital status: Divorced    Spouse name: Not on file  . Number of children: Not on file  . Years of education: Not on file  . Highest education level: Not on file  Occupational History  . Not on file  Tobacco Use  . Smoking status: Never Smoker  . Smokeless tobacco: Never Used  Substance and Sexual Activity  . Alcohol use: Yes    Alcohol/week: 4.0 standard drinks    Types: 4 Glasses of wine per week  . Drug use: No  . Sexual activity: Not Currently  Other Topics Concern  . Not on file  Social History Narrative  . Not on file   Social Determinants of Health   Financial Resource Strain:   . Difficulty of Paying Living Expenses:   Food Insecurity:   . Worried About Charity fundraiser in the Last Year:   . Arboriculturist in the Last Year:     Transportation Needs:   . Film/video editor (Medical):   Marland Kitchen Lack of Transportation (Non-Medical):   Physical Activity:   . Days of Exercise per Week:   . Minutes of Exercise per Session:   Stress:   . Feeling of Stress :   Social Connections:   . Frequency of Communication with Friends and Family:   . Frequency of Social Gatherings with Friends and Family:   . Attends Religious Services:   . Active Member of Clubs or Organizations:   . Attends Archivist Meetings:   Marland Kitchen Marital Status:      Family History:  The patient's family history includes Alzheimer's disease in her maternal grandfather; Arthritis/Rheumatoid in her maternal grandmother; Breast cancer (age of onset: 78) in her sister; COPD in her mother; Cancer in her paternal  uncle; Healthy in her brother and daughter; Heart disease in her maternal grandmother and paternal grandmother; Hypertension in her paternal grandmother; Lung cancer in her paternal grandfather; Pancreatic cancer (age of onset: 2) in her father.  ROS:   Please see the history of present illness.  All other systems are reviewed and otherwise negative.    EKGs/Labs/Other Studies Reviewed:    Studies reviewed are outlined and summarized above. Reports included below if pertinent.  R/LHC 2016 PROCEDURE:   Right and left heart catheterization: Swan-Ganz catheterization , cardiac output determination by the thermodilution and Fick methods, coronary angiography, left ventriculography.  The patient was brought to the second floor [facility]iac cath lab in the postabsorptive state. Versed 2 mg and fentanyl 50 mcg were administered for conscious sedation. The right groin was prepped and draped in sterile fashion and a 5 [place] arterial sheath and 7 French venous sheath were inserted without difficulty. A Swan-Ganz catheter was advanced into the venous sheath and pressures were obtained in the right atrium, right ventricle, pulmonary artery, and  pulmonary capillary wedge position. Cardiac outputs were obtained by the thermodilution and assumed Fick methods. Oxygen saturation was obtained in the pulmonary artery and aorta. A pigtail catheter was inserted and simultaneous AO/PA pressures were recorded. The pigtail catheter was advanced into the left ventricle and simultaneous left ventricular and PCW pressures were recorded. Left ventriculography was performed in the RAO projection.  A left ventricle to aorta pullback was performed. The pigtail catheter was then removed and diagnostic catheterization to delineate the coronary anatomy was performed utilizing 5 French Judkins 4 left and right diagnostic catheters. All catheters were removed and the patient. Hemostasis was obtained by direct manual pressure. The patient tolerated the procedure well and returned to his room in satisfactory condition.   HEMODYNAMICS:   RA: a 8  V 4; Mean 5 RV: 45/6 PA: 45/15 PC: 22 AO: 117/62  LV: 110/21  PC: 22  LV: 110/20 AO: 110/73   Oxygen saturation in the aorta 93% and the pulmonary artery 56%  Cardiac output: 5.2 l/min (Thermo); 4.8 (Fick)  Cardiac index: 2.6 l/m/m2                2.4  ANGIOGRAPHY:   Left main:  Angiographically normal short vessel which bifurcated into the LAD and left circumflex coronary arteries.  LAD:  Angiographically normal vessel which gave rise to several septal perforating arteries and one major diagonal vessel which extended to the apex.  Left circumflex:  Angiographically normal vessel which gave rise to one marginal branch.   Right coronary artery:  Angiographically normal, dominant vessel which gave rise to a moderate size PDA and PLA vessel.  Left ventriculography  revealed a severely dilated left ventricle  With severe diffuse global hypokinesis and an ejection fraction less than or equal to 15%.   IMPRESSION:  Nonischemic cardiomyopathy with a severely dilated left ventricle with an ejection  fraction of equal to or less than 15%.  Normal coronary arteries.  RECOMMENDATION:  Aggressive medical therapy with hope for improvement in LV function. Patient may require a temporary LifeVest prior to discharge and if LV function does not improved with  Therapy will need prophylactic ICD implantation.    Troy Sine, MD, [facility] 11/17/2014 12:12 PM   2D Echo 12/2018 IMPRESSIONS    1. The left ventricle has normal systolic function, with an ejection  fraction of 55-60%. The cavity size was normal. There is mildly increased  left ventricular wall  thickness. Left ventricular diastolic Doppler  parameters are indeterminate.  2. The right ventricle has normal systolic function. The cavity was  normal. There is no increase in right ventricular wall thickness.  3. The mitral valve is normal in structure.  4. The tricuspid valve is normal in structure.  5. The aortic valve is normal in structure.  6. LVEF is approximately 50 to 55% with septal hypokinesis and aneurysmal  dilitation of inferoapex with hypokinesis in that region.     EKG:  EKG is ordered today, personally reviewed, demonstrating NSR (atrial sensed), V pacing, with shortened PR interval and nonspecific STT changes  Recent Labs: 07/28/2019: ALT 16; BUN 13; Creatinine, Ser 0.89; Hemoglobin 13.0; NT-Pro BNP 212; Platelets 245; Potassium 4.5; Sodium 140; TSH 1.310  Recent Lipid Panel    Component Value Date/Time   CHOL 222 (H) 07/30/2018 1356   TRIG 122.0 07/30/2018 1356   HDL 61.70 07/30/2018 1356   CHOLHDL 4 07/30/2018 1356   VLDL 24.4 07/30/2018 1356   LDLCALC 136 (H) 07/30/2018 1356    PHYSICAL EXAM:    VS:  BP 104/66   Pulse 71   Ht 5' 6.5" (1.689 m)   Wt 217 lb 12.8 oz (98.8 kg)   SpO2 98%   BMI 34.63 kg/m   BMI: Body mass index is 34.63 kg/m.  GEN: Well nourished, well developed WF, in no acute distress HEENT: normocephalic, atraumatic Neck: no JVD, carotid bruits, or masses Cardiac:  RRR; no murmurs, rubs, or gallops, no edema  Respiratory:  clear to auscultation bilaterally, normal work of breathing GI: soft, nontender, nondistended, + BS MS: no deformity or atrophy Skin: warm and dry, no rash Neuro:  Alert and Oriented x 3, Strength and sensation are intact, follows commands Psych: euthymic mood, emotional affect  Wt Readings from Last 3 Encounters:  01/29/20 217 lb 12.8 oz (98.8 kg)  07/28/19 217 lb (98.4 kg)  01/17/19 214 lb (97.1 kg)     ASSESSMENT & PLAN:   1. Fatigue - generalized, smoldering, without acute change per patient. She does recall an episode of sweating easily while washing her car as well. She has chronic orthopnea. Weight is not significantly changed. She does not appear overtly volume overloaded on exam. Will update echocardiogram and basic labs to start. She is tearful during interview today and states she is tired of feeling tired and occasionally feels guilty for even bringing it up. I reassured her we are there to help her be her best version of herself. Unfortunately fatigue can also be a complicated symptom in the context of underlying ADD, anxiety/depression and sleep apnea. I do have concern that untreated OSA could be a unifying reason for why she feels poorly from the energy and emotion standpoint. Unfortunately she was unable to tolerate therapy for this. We can revisit if she decides to reconsider. 2. Chronic combined CHF/NICM - no changes made today. Check BNP with labs. She is only taking Lasix PRN as previously advised at this point. Update echocardiogram given complaints of fatigue. Of note she is also on Vyvanse managed by primary care which should be used with caution given her cardiac history. 3. Paroxysmal atrial fibrillation - does not appear to be on anticoagulation due to very low incidence in the past, only minutes, last seen in 03/2019. This is tracked by device interrogation as well. Continue surveillance with CRT-P. Will also  ensure she has f/u with EP.  4. LBBB with CRT-D in place - chronic finding for patient.  The last time she saw EP was in 2019. Will request overdue follow-up.  5. Dyslipidemia - last lipids 2019. She did not eat today. These are usually followed by primary care. Since we are getting labs anyway today, she would like [place] to obtain.  Disposition: F/u with myself virtually in 1 month. Also encouraged ongoing f/u with primary care as well.  Medication Adjustments/Labs and Tests Ordered: Current medicines are reviewed at length with the patient today.  Concerns regarding medicines are outlined above. Medication changes, Labs and Tests ordered today are summarized above and listed in the Patient Instructions accessible in Encounters.   Signed, Charlie Pitter, PA-C  01/29/2020 10:19 AM    [facility] [address], [place], [place]  [address] Phone: [phone]; Fax: [phone]

## 2020-01-28 ENCOUNTER — Other Ambulatory Visit: Payer: Self-pay | Admitting: Physician Assistant

## 2020-01-28 DIAGNOSIS — F988 Other specified behavioral and emotional disorders with onset usually occurring in childhood and adolescence: Secondary | ICD-10-CM

## 2020-01-28 MED ORDER — LISDEXAMFETAMINE DIMESYLATE 40 MG PO CAPS: 40.0000 mg | ORAL_CAPSULE | ORAL | 0 refills | Status: DC

## 2020-01-28 NOTE — Telephone Encounter (Signed)
Vyvanse last rx 12/15/19 #30 LOV: 11/10/19 ADD

## 2020-01-29 ENCOUNTER — Ambulatory Visit: Payer: Medicare Other | Admitting: Physician Assistant

## 2020-01-29 ENCOUNTER — Encounter: Payer: Self-pay | Admitting: Physician Assistant

## 2020-01-29 ENCOUNTER — Telehealth: Payer: Self-pay | Admitting: *Deleted

## 2020-01-29 ENCOUNTER — Other Ambulatory Visit: Payer: Self-pay

## 2020-01-29 VITALS — BP 104/66 | HR 71 | Ht 66.5 in | Wt 217.8 lb

## 2020-01-29 DIAGNOSIS — Z95810 Presence of automatic (implantable) cardiac defibrillator: Secondary | ICD-10-CM | POA: Diagnosis not present

## 2020-01-29 DIAGNOSIS — I480 Paroxysmal atrial fibrillation: Secondary | ICD-10-CM | POA: Diagnosis not present

## 2020-01-29 DIAGNOSIS — I5042 Chronic combined systolic (congestive) and diastolic (congestive) heart failure: Secondary | ICD-10-CM

## 2020-01-29 DIAGNOSIS — R5383 Other fatigue: Secondary | ICD-10-CM

## 2020-01-29 DIAGNOSIS — I447 Left bundle-branch block, unspecified: Secondary | ICD-10-CM | POA: Diagnosis not present

## 2020-01-29 DIAGNOSIS — I428 Other cardiomyopathies: Secondary | ICD-10-CM | POA: Diagnosis not present

## 2020-01-29 DIAGNOSIS — E785 Hyperlipidemia, unspecified: Secondary | ICD-10-CM

## 2020-01-29 NOTE — Telephone Encounter (Signed)
  Patient Consent for Virtual Visit         Debra Holloway has provided verbal consent on 01/29/2020 for a virtual visit (video or telephone).   CONSENT FOR VIRTUAL VISIT FOR:  Debra Holloway  By participating in this virtual visit I agree to the following:  I hereby voluntarily request, consent and authorize [facility] and its employed or contracted physicians, physician assistants, nurse practitioners or other licensed health care professionals (the Practitioner), to provide me with telemedicine health care services (the "Services") as deemed necessary by the treating Practitioner. I acknowledge and consent to receive the Services by the Practitioner via telemedicine. I understand that the telemedicine visit will involve communicating with the Practitioner through live audiovisual communication technology and the disclosure of certain medical information by electronic transmission. I acknowledge that I have been given the opportunity to request an in-person assessment or other available alternative prior to the telemedicine visit and am voluntarily participating in the telemedicine visit.  I understand that I have the right to withhold or withdraw my consent to the use of telemedicine in the course of my care at any time, without affecting my right to future care or treatment, and that the Practitioner or I may terminate the telemedicine visit at any time. I understand that I have the right to inspect all information obtained and/or recorded in the course of the telemedicine visit and may receive copies of available information for a reasonable fee.  I understand that some of the potential risks of receiving the Services via telemedicine include:  Marland Kitchen Delay or interruption in medical evaluation due to technological equipment failure or disruption; . Information transmitted may not be sufficient (e.g. poor resolution of images) to allow for appropriate medical decision making by the Practitioner;  and/or  . In rare instances, security protocols could fail, causing a breach of personal health information.  Furthermore, I acknowledge that it is my responsibility to provide information about my medical history, conditions and care that is complete and accurate to the best of my ability. I acknowledge that Practitioner's advice, recommendations, and/or decision may be based on factors not within their control, such as incomplete or inaccurate data provided by me or distortions of diagnostic images or specimens that may result from electronic transmissions. I understand that the practice of medicine is not an exact science and that Practitioner makes no warranties or guarantees regarding treatment outcomes. I acknowledge that a copy of this consent can be made available to me via my patient portal ([facility]), or I can request a printed copy by calling the office of [facility].    I understand that my insurance will be billed for this visit.   I have read or had this consent read to me. . I understand the contents of this consent, which adequately explains the benefits and risks of the Services being provided via telemedicine.  . I have been provided ample opportunity to ask questions regarding this consent and the Services and have had my questions answered to my satisfaction. . I give my informed consent for the services to be provided through the use of telemedicine in my medical care

## 2020-01-29 NOTE — Patient Instructions (Addendum)
Medication Instructions:  Your physician recommends that you continue on your current medications as directed. Please refer to the Current Medication list given to you today.  *If you need a refill on your cardiac medications before your next appointment, please call your pharmacy*   Lab Work: TODAY:  CMET, MAG, LIPID, CBC, TSH, & FT4  If you have labs (blood work) drawn today and your tests are completely normal, you will receive your results only by: Marland Kitchen MyChart Message (if you have MyChart) OR . A paper copy in the mail If you have any lab test that is abnormal or we need to change your treatment, we will call you to review the results.   Testing/Procedures: Your physician has requested that you have an echocardiogram. Echocardiography is a painless test that uses sound waves to create images of your heart. It provides your doctor with information about the size and shape of your heart and how well your heart's chambers and valves are working. This procedure takes approximately one hour. There are no restrictions for this procedure.     Follow-Up: At [facility], you and your health needs are our priority.  As part of our continuing mission to provide you with exceptional heart care, we have created designated Provider Care Teams.  These Care Teams include your primary Cardiologist (physician) and Advanced Practice Providers (APPs -  Physician Assistants and Nurse Practitioners) who all work together to provide you with the care you need, when you need it.  We recommend signing up for the patient portal called "MyChart".  Sign up information is provided on this After Visit Summary.  MyChart is used to connect with patients for Virtual Visits (Telemedicine).  Patients are able to view lab/test results, encounter notes, upcoming appointments, etc.  Non-urgent messages can be sent to your provider as well.   To learn more about what you can do with MyChart, go to [email or web address].     Your next appointment:   1 month(s)  The format for your next appointment:   Virtual Visit   Provider:   Melina Copa, PA-C   Other Instructions You are overdue to see Dr. Caryl Comes.  Someone will call you to schedule that.  Echocardiogram An echocardiogram is a procedure that uses painless sound waves (ultrasound) to produce an image of the heart. Images from an echocardiogram can provide important information about:  Signs of coronary artery disease (CAD).  Aneurysm detection. An aneurysm is a weak or damaged part of an artery wall that bulges out from the normal force of blood pumping through the body.  Heart size and shape. Changes in the size or shape of the heart can be associated with certain conditions, including heart failure, aneurysm, and CAD.  Heart muscle function.  Heart valve function.  Signs of a past heart attack.  Fluid buildup around the heart.  Thickening of the heart muscle.  A tumor or infectious growth around the heart valves. Tell a health care provider about:  Any allergies you have.  All medicines you are taking, including vitamins, herbs, eye drops, creams, and over-the-counter medicines.  Any blood disorders you have.  Any surgeries you have had.  Any medical conditions you have.  Whether you are pregnant or may be pregnant. What are the risks? Generally, this is a safe procedure. However, problems may occur, including:  Allergic reaction to dye (contrast) that may be used during the procedure. What happens before the procedure? No specific preparation is needed. You may eat and  drink normally. What happens during the procedure?   An IV tube may be inserted into one of your veins.  You may receive contrast through this tube. A contrast is an injection that improves the quality of the pictures from your heart.  A gel will be applied to your chest.  A wand-like tool (transducer) will be moved over your chest. The gel will help to  transmit the sound waves from the transducer.  The sound waves will harmlessly bounce off of your heart to allow the heart images to be captured in real-time motion. The images will be recorded on a computer. The procedure may vary among health care providers and hospitals. What happens after the procedure?  You may return to your normal, everyday life, including diet, activities, and medicines, unless your health care provider tells you not to do that. Summary  An echocardiogram is a procedure that uses painless sound waves (ultrasound) to produce an image of the heart.  Images from an echocardiogram can provide important information about the size and shape of your heart, heart muscle function, heart valve function, and fluid buildup around your heart.  You do not need to do anything to prepare before this procedure. You may eat and drink normally.  After the echocardiogram is completed, you may return to your normal, everyday life, unless your health care provider tells you not to do that. This information is not intended to replace advice given to you by your health care provider. Make sure you discuss any questions you have with your health care provider. Document Revised: 01/09/2019 Document Reviewed: 10/21/2016 Elsevier Patient Education  [place] team (Cardiologist [Heart Doctor] and Journalist, newspaper Assistant; Nurse Practitioner]) has arranged for your next office appointment to be a virtual visit (also known as "Telehealth", "Telemedicine", "E-Visit").   We now offer virtual visits for all our patients.  This helps [place] to expand our ability to see patients in a timely and safe manner.  These visits are billed to your insurance just like traditional, in person, appointments.  Please review this IMPORTANT information about your upcoming appointment.   **PLEASE READ THE SECTION BELOW LABELED "CONSENT".**   **CALL OUR OFFICE WITH  QUESTIONS.**   WHAT YOU NEED FOR YOUR VIRTUAL VISIT:  You will need a SmartPhone with microphone and video capability.  [If you are using MyChart to connect to your visit, it is also possible to use a desktop/laptop computer (with an Internet connection), as long as you have microphone and video capability.]  You will need to use Chrome, Edge or [employer] as your Wellsite geologist.  We highly recommend that you have a MyChart account as this will make connecting to your visit seamless.  A MyChart account not only allows you to connect to your provider for a virtual visit, but also allows you to see the results of all your tests, provider notes, medications and upcoming appointments.    A MyChart account is not absolutely necessary.  We can still complete your visit if you do not have one.    If you do not have a computer or SmartPhone with video/microphone capability or your Internet/cell service is weak on the day of your visit, we will do your visit by telephone.    A blood pressure cuff and scale are essential to collect your vital signs at home.  If you do not have these and you are unable to obtain them, please contact our office  so that we can make arrangements for you.  If you have a pulse oximeter, Apple watch, Kardia mobile device, etc, you can collect data from these devices as well to share with the provider for your visit.  These devices are not required for a virtual visit.    WHAT TO DO ON THE DAY OF YOUR APPOINTMENT: 30 minutes before your appointment:  Take your blood pressure, pulse or heart rate (if your blood pressure machine is able to collect it) and weight.  Write all these numbers down so you can give it to the nurse or medical assistant that calls.  Get all of the medications you currently take and put them where you will be sitting for the appointment.  The nurse/medical assistant will go over these with you when he/she calls.  15 minutes before your  appointment:  You will receive a phone call from a nurse or medical assistant from our office.  The caller ID on your phone may indicate that the caller is either "[facility]" or "[place]".  However, the number may come across as spam.  Please turn off any spam blocker so you do not miss the call.  The nurse will:  Ask for your blood pressure, pulse, weight, height.  Go over all of your medications to make sure your chart is correct.  Review your allergies, smoking history, reason for appointment, etc.   Give you instructions on how to connect with the video platform or telephone.  IF YOUR VISIT IS BY TELEPHONE ONLY: After the nurse finishes getting you ready for the visit, your provider will call you on the phone number you provide to [place].   TO CONNECT WITH YOUR PROVIDER FOR YOUR APPOINTMENT (BY VIDEO): You will either connect with the provider with your MyChart account or (if you are not using MyChart) with a link sent to your SmartPhone by text message.  If you are using MyChart, see below.  If you are not using MyChart, the nurse will send you the text message during the phone call.     If you are connecting with your MyChart account:   (The nurse that calls you will tell you when to do this):  You will log into your account. At the top of your home screen, you should see the following prompt that tells you to "Begin your video visit with . . . ".  Click the green button (BEGIN VISIT).    The next screen will say "It's time to start your video visit!" Click the green button (BEGIN VIDEO VISIT)    There may be a screen that appears that asks for permission to use your camera and/or microphone.  Click ALLOW.  This will open the browser where your appointment will take place.   You may see the message: "Welcome.  Waiting for the call to begin."  Or, you will see that the nurse or your provider are already "in" the room waiting on you.   If you are connecting with a link  sent to your SmartPhone via text: The nurse that calls you will send the link by text message. Click on the link (it should look something like this):     If asked to give permission to use the camera and or microphone, click ALLOW This will open the browser where your appointment will take place.      You may see the message: "Welcome.  Waiting for the call to begin."  Or, you will see  that the nurse or your provider are already "in" the room waiting on you.    The controls for your visit look like the picture below.  Please note that this is what the microphone and camera look like when they are ON.  If muted, they will have a line through them.    After the appointment: Once your provider leaves the appointment, he/she will go over instructions with the nurse/medical assistant.  If needed, this person will call you with any instructions, appointments, etc.   A copy of your After Visit Summary (AVS) will be available later that day in your MyChart account.  This document will have all of your instructions, medications, appointments, etc.  If you are not using MyChart, we will mail it to your home.     *CONSENT FOR TELE-HEALTH VISIT - PLEASE REVIEW* By participating in the scheduled virtual visit (and any virtual visit scheduled within 365 days of the printing of this document), I agree to the following:   I hereby voluntarily request, consent and authorize [facility] and its employed or contracted physicians, physician assistants, nurse practitioners or other licensed health care professionals (the Practitioner), to provide me with telemedicine health care services (the "Services") as deemed necessary by the treating Practitioner. I acknowledge and consent to receive the Services by the Practitioner via telemedicine. I understand that the telemedicine visit will involve communicating with the Practitioner through live audiovisual communication technology and the disclosure of certain  medical information by electronic transmission. I acknowledge that I have been given the opportunity to request an in-person assessment or other available alternative prior to the telemedicine visit and am voluntarily participating in the telemedicine visit.  I understand that I have the right to withhold or withdraw my consent to the use of telemedicine in the course of my care at any time, without affecting my right to future care or treatment, and that the Practitioner or I may terminate the telemedicine visit at any time. I understand that I have the right to inspect all information obtained and/or recorded in the course of the telemedicine visit and may receive copies of available information for a reasonable fee.  I understand that some of the potential risks of receiving the Services via telemedicine include:  Marland Kitchen Delay or interruption in medical evaluation due to technological equipment failure or disruption; . Information transmitted may not be sufficient (e.g. poor resolution of images) to allow for appropriate medical decision making by the Practitioner; and/or  . In rare instances, security protocols could fail, causing a breach of personal health information.  Furthermore, I acknowledge that it is my responsibility to provide information about my medical history, conditions and care that is complete and accurate to the best of my ability. I acknowledge that Practitioner's advice, recommendations, and/or decision may be based on factors not within their control, such as incomplete or inaccurate data provided by me or distortions of diagnostic images or specimens that may result from electronic transmissions. I understand that the practice of medicine is not an exact science and that Practitioner makes no warranties or guarantees regarding treatment outcomes. I acknowledge that a copy of this consent can be made available to me via my patient portal ([facility]), or I can request a printed  copy by calling the office of [place].    I understand that my insurance will be billed for this visit.   I have read or had this consent read to me. . I understand the  contents of this consent, which adequately explains the benefits and risks of the Services being provided via telemedicine.  . I have been provided ample opportunity to ask questions regarding this consent and the Services and have had my questions answered to my satisfaction. . I give my informed consent for the services to be provided through the use of telemedicine in my medical care

## 2020-01-30 LAB — COMPREHENSIVE METABOLIC PANEL
ALT: 10 IU/L (ref 0–32)
AST: 15 IU/L (ref 0–40)
Albumin/Globulin Ratio: 1.7 (ref 1.2–2.2)
Albumin: 4.5 g/dL (ref 3.8–4.8)
Alkaline Phosphatase: 65 IU/L (ref 39–117)
BUN/Creatinine Ratio: 16 (ref 12–28)
BUN: 16 mg/dL (ref 8–27)
Bilirubin Total: 0.4 mg/dL (ref 0.0–1.2)
CO2: 21 mmol/L (ref 20–29)
Calcium: 9.5 mg/dL (ref 8.7–10.3)
Chloride: 102 mmol/L (ref 96–106)
Creatinine, Ser: 1.02 mg/dL — ABNORMAL HIGH (ref 0.57–1.00)
GFR calc Af Amer: 68 mL/min/{1.73_m2} (ref >59)
GFR calc non Af Amer: 59 mL/min/{1.73_m2} — ABNORMAL LOW (ref >59)
Globulin, Total: 2.6 g/dL (ref 1.5–4.5)
Glucose: 91 mg/dL (ref 65–99)
Potassium: 4.2 mmol/L (ref 3.5–5.2)
Sodium: 139 mmol/L (ref 134–144)
Total Protein: 7.1 g/dL (ref 6.0–8.5)

## 2020-01-30 LAB — TSH: TSH: 2.07 u[IU]/mL (ref 0.450–4.500)

## 2020-01-30 LAB — CBC
Hematocrit: 37.5 % (ref 34.0–46.6)
Hemoglobin: 12.8 g/dL (ref 11.1–15.9)
MCH: 30.8 pg (ref 26.6–33.0)
MCHC: 34.1 g/dL (ref 31.5–35.7)
MCV: 90 fL (ref 79–97)
Platelets: 254 10*3/uL (ref 150–450)
RBC: 4.16 x10E6/uL (ref 3.77–5.28)
RDW: 12.7 % (ref 11.7–15.4)
WBC: 8.2 10*3/uL (ref 3.4–10.8)

## 2020-01-30 LAB — LIPID PANEL
Chol/HDL Ratio: 3.9 ratio (ref 0.0–4.4)
Cholesterol, Total: 224 mg/dL — ABNORMAL HIGH (ref 100–199)
HDL: 57 mg/dL (ref >39)
LDL Chol Calc (NIH): 147 mg/dL — ABNORMAL HIGH (ref 0–99)
Triglycerides: 115 mg/dL (ref 0–149)
VLDL Cholesterol Cal: 20 mg/dL (ref 5–40)

## 2020-01-30 LAB — T4, FREE: Free T4: 1.18 ng/dL (ref 0.82–1.77)

## 2020-01-30 LAB — PRO B NATRIURETIC PEPTIDE: NT-Pro BNP: 116 pg/mL (ref 0–287)

## 2020-01-30 LAB — MAGNESIUM: Magnesium: 2.2 mg/dL (ref 1.6–2.3)

## 2020-02-19 ENCOUNTER — Telehealth (HOSPITAL_COMMUNITY): Payer: Self-pay | Admitting: Physician Assistant

## 2020-02-19 ENCOUNTER — Other Ambulatory Visit (HOSPITAL_COMMUNITY): Payer: Medicare Other

## 2020-02-19 NOTE — Telephone Encounter (Signed)
Noted. Patient appears to have f/u scheduled 6/2 to discuss echo so may need to move appointment until after date patient will feel ready to proceed. Otherwise encourage close f/u with primary care in the meantime as I know she has a lot on her plate from our last visit together. Routing to triage as Anderson Malta is out. Jordy Hewins PA-C

## 2020-02-19 NOTE — Telephone Encounter (Signed)
Patient notified. She would like to cancel appointment for 6/2 with Melina Copa, PA as echo is not being done at this time. She will contact office to reschedule appointments once mask requirements change.

## 2020-02-19 NOTE — Telephone Encounter (Signed)
Patient cancelled Echocardiogram and does not want to reschedule due to she is NOT going to wear a mask because she cant breathe good with it on and causes her major anxiety. If mask mandate goes away she will be glad to have and hopes we will notify her of this. Order will be removed from the [place] and if she decides to have later we will reinstate or enter new order.

## 2020-02-23 ENCOUNTER — Other Ambulatory Visit: Payer: Self-pay

## 2020-02-23 MED ORDER — SPIRONOLACTONE 25 MG PO TABS: ORAL_TABLET | ORAL | 11 refills | Status: DC

## 2020-02-23 MED ORDER — FUROSEMIDE 40 MG PO TABS: 40.0000 mg | ORAL_TABLET | ORAL | 11 refills | Status: DC | PRN

## 2020-03-03 ENCOUNTER — Telehealth: Payer: Medicare Other | Admitting: Physician Assistant

## 2020-03-08 ENCOUNTER — Other Ambulatory Visit: Payer: Self-pay | Admitting: Physician Assistant

## 2020-03-08 ENCOUNTER — Encounter: Payer: Self-pay | Admitting: Physician Assistant

## 2020-03-08 DIAGNOSIS — F988 Other specified behavioral and emotional disorders with onset usually occurring in childhood and adolescence: Secondary | ICD-10-CM

## 2020-03-08 MED ORDER — LISDEXAMFETAMINE DIMESYLATE 40 MG PO CAPS: 40.0000 mg | ORAL_CAPSULE | ORAL | 0 refills | Status: DC

## 2020-04-01 ENCOUNTER — Ambulatory Visit (INDEPENDENT_AMBULATORY_CARE_PROVIDER_SITE_OTHER): Payer: Medicare Other | Admitting: *Deleted

## 2020-04-01 DIAGNOSIS — I428 Other cardiomyopathies: Secondary | ICD-10-CM | POA: Diagnosis not present

## 2020-04-01 LAB — CUP PACEART REMOTE DEVICE CHECK
Battery Remaining Longevity: 27 mo
Battery Voltage: 2.92 V
Brady Statistic AP VP Percent: 0.53 %
Brady Statistic AP VS Percent: 0.02 %
Brady Statistic AS VP Percent: 98.55 %
Brady Statistic AS VS Percent: 0.89 %
Brady Statistic RA Percent Paced: 0.55 %
Brady Statistic RV Percent Paced: 98.78 %
Date Time Interrogation Session: 20210701031705
HighPow Impedance: 77 Ohm
Implantable Lead Implant Date: 20161114
Implantable Lead Implant Date: 20161114
Implantable Lead Implant Date: 20161114
Implantable Lead Location: 753858
Implantable Lead Location: 753859
Implantable Lead Location: 753860
Implantable Lead Model: 4598
Implantable Lead Model: 5076
Implantable Pulse Generator Implant Date: 20161114
Lead Channel Impedance Value: 304 Ohm
Lead Channel Impedance Value: 342 Ohm
Lead Channel Impedance Value: 399 Ohm
Lead Channel Impedance Value: 399 Ohm
Lead Channel Impedance Value: 418 Ohm
Lead Channel Impedance Value: 418 Ohm
Lead Channel Impedance Value: 532 Ohm
Lead Channel Impedance Value: 551 Ohm
Lead Channel Impedance Value: 608 Ohm
Lead Channel Impedance Value: 608 Ohm
Lead Channel Impedance Value: 817 Ohm
Lead Channel Impedance Value: 874 Ohm
Lead Channel Impedance Value: 931 Ohm
Lead Channel Pacing Threshold Amplitude: 0.625 V
Lead Channel Pacing Threshold Amplitude: 0.625 V
Lead Channel Pacing Threshold Amplitude: 0.875 V
Lead Channel Pacing Threshold Pulse Width: 0.4 ms
Lead Channel Pacing Threshold Pulse Width: 0.4 ms
Lead Channel Pacing Threshold Pulse Width: 0.4 ms
Lead Channel Sensing Intrinsic Amplitude: 1.75 mV
Lead Channel Sensing Intrinsic Amplitude: 1.75 mV
Lead Channel Sensing Intrinsic Amplitude: 24.125 mV
Lead Channel Sensing Intrinsic Amplitude: 24.125 mV
Lead Channel Setting Pacing Amplitude: 2 V
Lead Channel Setting Pacing Amplitude: 2.5 V
Lead Channel Setting Pacing Amplitude: 2.5 V
Lead Channel Setting Pacing Pulse Width: 0.4 ms
Lead Channel Setting Pacing Pulse Width: 0.4 ms
Lead Channel Setting Sensing Sensitivity: 0.45 mV

## 2020-04-02 ENCOUNTER — Encounter: Payer: Medicare Other | Admitting: Internal Medicine

## 2020-04-02 NOTE — Progress Notes (Signed)
Remote ICD transmission.   

## 2020-04-06 ENCOUNTER — Telehealth: Payer: Self-pay | Admitting: Physician Assistant

## 2020-04-06 NOTE — Telephone Encounter (Signed)
I will forward to our echo dept to follow up with the pt.

## 2020-04-06 NOTE — Telephone Encounter (Signed)
Spoke with patient. Will attempt echocardiogram with patient partially elevated. Patient is scheduled for 04/23/20.

## 2020-04-06 NOTE — Telephone Encounter (Signed)
Debra Holloway is calling wanting to know if there is anyway she could partially be sitting up during her Echo due to having anxiety and not being able to lay down in a little dark room with a mask on for that long. Please advise.

## 2020-04-15 ENCOUNTER — Encounter: Payer: Self-pay | Admitting: Emergency Medicine

## 2020-04-15 ENCOUNTER — Encounter: Payer: Self-pay | Admitting: Physician Assistant

## 2020-04-15 DIAGNOSIS — F988 Other specified behavioral and emotional disorders with onset usually occurring in childhood and adolescence: Secondary | ICD-10-CM

## 2020-04-15 MED ORDER — LISDEXAMFETAMINE DIMESYLATE 40 MG PO CAPS: 40.0000 mg | ORAL_CAPSULE | ORAL | 0 refills | Status: DC

## 2020-04-15 NOTE — Telephone Encounter (Signed)
Vyvanse last rx 03/08/20 #30 LOV: 11/10/19 ADD Next appt: 04/20/20 Medicare Wellness with Health Coach

## 2020-04-15 NOTE — Telephone Encounter (Signed)
My chart message sent to patient advising she will be due for ADD follow up next month

## 2020-04-16 NOTE — Telephone Encounter (Signed)
Patient called and scheduled appt. For May 03, 2020

## 2020-04-19 DIAGNOSIS — Z95810 Presence of automatic (implantable) cardiac defibrillator: Secondary | ICD-10-CM | POA: Insufficient documentation

## 2020-04-19 NOTE — Progress Notes (Signed)
Subjective:   Debra Holloway is a 63 y.o. female who presents for an Initial Medicare Annual Wellness Visit.  I connected with Marlaine today by telephone and verified that I am speaking with the correct person using two identifiers. Location patient: home Location provider: work Persons participating in the virtual visit: patient, Marine scientist.    I discussed the limitations, risks, security and privacy concerns of performing an evaluation and management service by telephone and the availability of in person appointments. I also discussed with the patient that there may be a patient responsible charge related to this service. The patient expressed understanding and verbally consented to this telephonic visit.    Interactive audio and video telecommunications were attempted between this provider and patient, however failed, due to patient having technical difficulties OR patient did not have access to video capability.  We continued and completed visit with audio only.  Some vital signs may be absent or patient reported.   Time Spent with patient on telephone encounter: 20 minutes  Review of Systems     Cardiac Risk Factors include: dyslipidemia;obesity (BMI >30kg/m2);sedentary lifestyle     Objective:    Today's Vitals   04/20/20 1002  Weight: 217 lb (98.4 kg)  Height: 5\' 6"  (1.676 m)   Body mass index is 35.02 kg/m.  Advanced Directives 04/20/2020 05/24/2017 09-14-15 03/26/2015 03/04/2015 01/28/2015 11/13/2014  Does Patient Have a Medical Advance Directive? No No No No No No No  Would patient like information on creating a medical advance directive? No - Patient declined - No - patient declined information No - patient declined information No - patient declined information No - patient declined information -    Current Medications (verified) Outpatient Encounter Medications as of 04/20/2020  Medication Sig  . acetaminophen (TYLENOL) 500 MG tablet Take 500 mg by mouth every 6 (six) hours  as needed for moderate pain.  Marland Kitchen b complex vitamins tablet Take 1 tablet by mouth daily.  Marland Kitchen buPROPion (WELLBUTRIN XL) 150 MG 24 hr tablet TAKE 1 TABLET(150 MG) BY MOUTH DAILY  . carvedilol (COREG) 12.5 MG tablet TAKE 1 TABLET BY MOUTH TWICE DAILY WITH A MEAL  . Cholecalciferol (VITAMIN D PO) Take 2,000 mg by mouth daily.   . furosemide (LASIX) 40 MG tablet Take 1 tablet (40 mg total) by mouth as needed.  Marland Kitchen lisdexamfetamine (VYVANSE) 40 MG capsule Take 1 capsule (40 mg total) by mouth every morning.  Marland Kitchen lisinopril (ZESTRIL) 10 MG tablet Take 1 tablet (10 mg total) by mouth daily.  Marland Kitchen loratadine (CLARITIN) 10 MG tablet   . potassium chloride (KLOR-CON) 10 MEQ tablet Take 10 mEq by mouth as needed (when take furosemide).  Marland Kitchen spironolactone (ALDACTONE) 25 MG tablet TAKE 1 TABLET(25 MG) BY MOUTH DAILY  . [DISCONTINUED] fexofenadine (ALLEGRA) 180 MG tablet Take 1 tablet (180 mg total) by mouth daily.  . [DISCONTINUED] mometasone (NASONEX) 50 MCG/ACT nasal spray Place 2 sprays into the nose daily.   No facility-administered encounter medications on file as of 04/20/2020.    Allergies (verified) Bee venom, Losartan, Erythromycin, Lovastatin, Penicillins, and Simvastatin   History: Past Medical History:  Diagnosis Date  . ADD (attention deficit disorder)   . AICD (automatic cardioverter/defibrillator) present 09-14-15  . Anxiety    "just when my daddy died a few years ago" (14-Sep-2015)  . AR (allergic rhinitis)   . Chicken pox   . Chronic combined systolic and diastolic CHF (congestive heart failure) ([place])   . Depression    "just  when my daddy died a few years ago" (August 26, 2015)  . Dyslipidemia   . Environmental allergies   . H/O vitamin D deficiency   . LBBB (left bundle branch block)    W/1st degree AV Block, Avoid Beta Blockers  . NICM (nonischemic cardiomyopathy) ([place])   . OSA (obstructive sleep apnea) 03/18/2015   Severe OSA with AHI 30/hr; "suppose to wear mask but don't" (08-26-2015)    . Seasonal affective disorder [facility])    Past Surgical History:  Procedure Laterality Date  . CARDIAC CATHETERIZATION    . DILATION AND CURETTAGE OF UTERUS  2003  . EP IMPLANTABLE DEVICE N/A 2015-08-26   Procedure: BiV ICD Insertion CRT-D;  Surgeon: Deboraha Sprang, MD;  Location: [facility];  Service: Cardiovascular;  Laterality: N/A;  . LEFT AND RIGHT HEART CATHETERIZATION WITH CORONARY ANGIOGRAM N/A 11/17/2014   Procedure: LEFT AND RIGHT HEART CATHETERIZATION WITH CORONARY ANGIOGRAM;  Surgeon: Troy Sine, MD;  Location: [facility] CATH LAB;  Service: Cardiovascular;  Laterality: N/A;  . TOTAL ABDOMINAL HYSTERECTOMY  2004  . TUBAL LIGATION    . WISDOM TOOTH EXTRACTION  1986   Family History  Problem Relation Age of Onset  . COPD Mother        Living  . Pancreatic cancer Father 38       Deceased  . Lung cancer Paternal Grandfather   . Heart disease Paternal Grandmother   . Hypertension Paternal Grandmother   . Alzheimer's disease Maternal Grandfather   . Arthritis/Rheumatoid Maternal Grandmother   . Heart disease Maternal Grandmother        Tachycardia  . Cancer Paternal Uncle   . Healthy Brother        x3  . Healthy Daughter        x3  . Breast cancer Sister 11   Social History   Socioeconomic History  . Marital status: Divorced    Spouse name: Not on file  . Number of children: Not on file  . Years of education: Not on file  . Highest education level: Not on file  Occupational History  . Not on file  Tobacco Use  . Smoking status: Never Smoker  . Smokeless tobacco: Never Used  Vaping Use  . Vaping Use: Never used  Substance and Sexual Activity  . Alcohol use: Yes    Alcohol/week: 4.0 standard drinks    Types: 4 Glasses of wine per week  . Drug use: No  . Sexual activity: Not Currently  Other Topics Concern  . Not on file  Social History Narrative  . Not on file   Social Determinants of Health   Financial Resource Strain: Low Risk   . Difficulty of  Paying Living Expenses: Not hard at all  Food Insecurity: No Food Insecurity  . Worried About Charity fundraiser in the Last Year: Never true  . Ran Out of Food in the Last Year: Never true  Transportation Needs: No Transportation Needs  . Lack of Transportation (Medical): No  . Lack of Transportation (Non-Medical): No  Physical Activity: Inactive  . Days of Exercise per Week: 0 days  . Minutes of Exercise per Session: 0 min  Stress: No Stress Concern Present  . Feeling of Stress : Not at all  Social Connections: Socially Isolated  . Frequency of Communication with Friends and Family: More than three times a week  . Frequency of Social Gatherings with Friends and Family: Once a week  . Attends Religious Services: Never  .  Active Member of Clubs or Organizations: No  . Attends Archivist Meetings: Never  . Marital Status: Divorced    Tobacco Counseling Counseling given: Not Answered   Clinical Intake:  Pre-visit preparation completed: Yes  Pain : No/denies pain     Nutritional Status: BMI > 30  Obese Nutritional Risks: None Diabetes: No  How often do you need to have someone help you when you read instructions, pamphlets, or other written materials from your doctor or pharmacy?: 1 - Never  Diabetic?No  Interpreter Needed?: No  Information entered by :: Caroleen Hamman LPN   Activities of Daily Living In your present state of health, do you have any difficulty performing the following activities: 04/20/2020  Hearing? N  Vision? N  Difficulty concentrating or making decisions? N  Walking or climbing stairs? N  Dressing or bathing? N  Doing errands, shopping? N  Preparing Food and eating ? N  Using the Toilet? N  In the past six months, have you accidently leaked urine? N  Do you have problems with loss of bowel control? N  Managing your Medications? N  Managing your Finances? N  Housekeeping or managing your Housekeeping? N  Some recent data might be  hidden    Patient Care Team: Delorse Limber as PCP - General (Physician Assistant) Dorothy Spark, MD as PCP - Cardiology (Cardiology) Deboraha Sprang, MD as PCP - Electrophysiology (Cardiology)  Indicate any recent Medical Services you may have received from other than [facility] providers in the past year (date may be approximate).     Assessment:   This is a routine wellness examination for Brendan.  Hearing/Vision screen  Hearing Screening   125Hz  250Hz  500Hz  1000Hz  2000Hz  3000Hz  4000Hz  6000Hz  8000Hz   Right ear:           Left ear:           Comments: No issues  Vision Screening Comments: Wears glasses Last eye exam-2014  Dietary issues and exercise activities discussed: Current Exercise Habits: The patient does not participate in regular exercise at present, Exercise limited by: cardiac condition(s)  Goals    . Patient Stated     Would like to be more active & get out of the house more      Depression Screen PHQ 2/9 Scores 04/20/2020 11/10/2019 01/17/2019 07/30/2018 03/11/2018 06/26/2017 09/21/2016  PHQ - 2 Score 0 2 2 1 2 1 1   PHQ- 9 Score - 3 3 2 4 1  -    Fall Risk Fall Risk  04/20/2020 01/17/2019 09/21/2016 06/18/2016  Falls in the past year? 0 0 No No  Number falls in past yr: 0 0 - -  Injury with Fall? 0 0 - -  Follow up Falls prevention discussed Falls evaluation completed - -    Any stairs in or around the home? Yes  If so, are there any without handrails? No  Home free of loose throw rugs in walkways, pet beds, electrical cords, etc? Yes  Adequate lighting in your home to reduce risk of falls? Yes   ASSISTIVE DEVICES UTILIZED TO PREVENT FALLS:  Life alert? No  Use of a cane, walker or w/c? No  Grab bars in the bathroom? Yes  Shower chair or bench in shower? No  Elevated toilet seat or a handicapped toilet? No   TIMED UP AND GO:  Was the test performed? No .    Cognitive Function: No cognitive impairment noted. Patient plays games, such as  jeaopardy  on her phone frequently.        Immunizations Immunization History  Administered Date(s) Administered  . Influenza,inj,Quad PF,6+ Mos 09/07/2014, 07/30/2018  . Tdap 09/01/2013    TDAP status: Up to date   Flu Vaccine status: Up to date   Pneumococcal vaccine status: Declined,  Education has been provided regarding the importance of this vaccine but patient still declined. Advised may receive this vaccine at local pharmacy or Health Dept. Aware to provide a copy of the vaccination record if obtained from local pharmacy or Health Dept. Verbalized acceptance and understanding.  Virtual visit  Covid-19 vaccine status: Declined, Education has been provided regarding the importance of this vaccine but patient still declined. Advised may receive this vaccine at local pharmacy or Health Dept.or vaccine clinic. Aware to provide a copy of the vaccination record if obtained from local pharmacy or Health Dept. Verbalized acceptance and understanding.  Qualifies for Shingles Vaccine? Yes   Zostavax completed No   Shingrix Completed?: No.    Education has been provided regarding the importance of this vaccine. Patient has been advised to call insurance company to determine out of pocket expense if they have not yet received this vaccine. Advised may also receive vaccine at local pharmacy or Health Dept. Verbalized acceptance and understanding.  Screening Tests Health Maintenance  Topic Date Due  . COVID-19 Vaccine (1) 05/06/2020 (Originally 10/01/1968)  . MAMMOGRAM  04/20/2021 (Originally 11/30/2017)  . COLONOSCOPY  04/20/2021 (Originally 10/02/2007)  . DTAP VACCINES (1) 04/20/2030 (Originally 11/29/1956)  . INFLUENZA VACCINE  05/02/2020  . DTaP/Tdap/Td (2 - Td or Tdap) 09/02/2023  . TETANUS/TDAP  09/02/2023  . Hepatitis C Screening  Completed  . HIV Screening  Completed    Health Maintenance  There are no preventive care reminders to display for this patient.  Colon Cancer  Screening:  Patient declined at this time.  Mammogram Status: Patient declined at this time   Lung Cancer Screening: (Low Dose CT Chest recommended if Age 28-80 years, 30 pack-year currently smoking OR have quit w/in 15years.) does not qualify.     Additional Screening:  Hepatitis C Screening:  Completed 12/07/2014  Vision Screening: Recommended annual ophthalmology exams for early detection of glaucoma and other disorders of the eye. Is the patient up to date with their annual eye exam?  No  Who is the provider or what is the name of the office in which the patient attends annual eye exams? unsure    Dental Screening: Recommended annual dental exams for proper oral hygiene  Community Resource Referral / Chronic Care Management: CRR required this visit?  No   CCM required this visit?  No      Plan:     I have personally reviewed and noted the following in the patient's chart:   . Medical and social history . Use of alcohol, tobacco or illicit drugs  . Current medications and supplements . Functional ability and status . Nutritional status . Physical activity . Advanced directives . List of other physicians . Hospitalizations, surgeries, and ER visits in previous 12 months . Vitals . Screenings to include cognitive, depression, and falls . Referrals and appointments  In addition, I have reviewed and discussed with patient certain preventive protocols, quality metrics, and best practice recommendations. A written personalized care plan for preventive services as well as general preventive health recommendations were provided to patient.     Due to this being a telephonic visit, the after visit summary with patients personalized plan was offered  to patient via mail or my-chart.  Patient would like to access on my-chart.   Marta Antu, LPN   [id number]  Nurse Health Advisor  Nurse Notes: None

## 2020-04-20 ENCOUNTER — Ambulatory Visit (INDEPENDENT_AMBULATORY_CARE_PROVIDER_SITE_OTHER): Payer: Medicare Other

## 2020-04-20 ENCOUNTER — Other Ambulatory Visit: Payer: Self-pay

## 2020-04-20 ENCOUNTER — Ambulatory Visit: Payer: Medicare Other | Admitting: Internal Medicine

## 2020-04-20 ENCOUNTER — Encounter: Payer: Self-pay | Admitting: Internal Medicine

## 2020-04-20 VITALS — Ht 66.0 in | Wt 217.0 lb

## 2020-04-20 VITALS — BP 108/66 | HR 71 | Ht 66.5 in | Wt 219.6 lb

## 2020-04-20 DIAGNOSIS — I5022 Chronic systolic (congestive) heart failure: Secondary | ICD-10-CM | POA: Diagnosis not present

## 2020-04-20 DIAGNOSIS — Z0000 Encounter for general adult medical examination without abnormal findings: Secondary | ICD-10-CM

## 2020-04-20 DIAGNOSIS — I420 Dilated cardiomyopathy: Secondary | ICD-10-CM | POA: Diagnosis not present

## 2020-04-20 DIAGNOSIS — Z95810 Presence of automatic (implantable) cardiac defibrillator: Secondary | ICD-10-CM

## 2020-04-20 MED ORDER — LISINOPRIL 5 MG PO TABS
5.0000 mg | ORAL_TABLET | Freq: Every day | ORAL | 3 refills | Status: DC
Start: 2020-04-20 — End: 2022-05-02

## 2020-04-20 MED ORDER — SPIRONOLACTONE 25 MG PO TABS: 12.5000 mg | ORAL_TABLET | Freq: Every day | ORAL | 3 refills | Status: DC

## 2020-04-20 MED ORDER — CARVEDILOL 6.25 MG PO TABS
6.2500 mg | ORAL_TABLET | Freq: Two times a day (BID) | ORAL | 3 refills | Status: DC
Start: 2020-04-20 — End: 2022-05-02

## 2020-04-20 NOTE — Progress Notes (Signed)
Patient Care Team: Delorse Limber as PCP - General (Physician Assistant) Dorothy Spark, MD as PCP - Cardiology (Cardiology) Deboraha Sprang, MD as PCP - Electrophysiology (Cardiology)   HPI  Debra Holloway is a 64 y.o. female Seen in follow-up for CRT-D-Medtronic implanted 11/16 for left bundle branch block with nonischemic cardiomyopathy with interval normalization.  Still has mild dyspnea on exertion and two-pillow orthopnea.  Some peripheral edema.  However, diuretics have been complicated by orthostatic lightheadedness and blood pressures recorded in the 80s.     DATE TEST EF   4/16 cMRI  25-30 %   4/17 Echo   40-45 %   8/18 Echo  50-55%   3/20 Echo  55-60%    Date Cr K  10/18 0.92 4.2   4/21 1.02 4.2      Past Medical History:  Diagnosis Date  . ADD (attention deficit disorder)   . AICD (automatic cardioverter/defibrillator) present Sep 05, 2015  . Anxiety    "just when my daddy died a few years ago" (09-05-15)  . AR (allergic rhinitis)   . Chicken pox   . Chronic combined systolic and diastolic CHF (congestive heart failure) ([place])   . Depression    "just when my daddy died a few years ago" (09/05/15)  . Dyslipidemia   . Environmental allergies   . H/O vitamin D deficiency   . LBBB (left bundle branch block)    W/1st degree AV Block, Avoid Beta Blockers  . NICM (nonischemic cardiomyopathy) ([place])   . OSA (obstructive sleep apnea) 03/18/2015   Severe OSA with AHI 30/hr; "suppose to wear mask but don't" (September 05, 2015)  . Seasonal affective disorder [facility])     Past Surgical History:  Procedure Laterality Date  . CARDIAC CATHETERIZATION    . DILATION AND CURETTAGE OF UTERUS  2003  . EP IMPLANTABLE DEVICE N/A Sep 05, 2015   Procedure: BiV ICD Insertion CRT-D;  Surgeon: Deboraha Sprang, MD;  Location: [facility];  Service: Cardiovascular;  Laterality: N/A;  . LEFT AND RIGHT HEART CATHETERIZATION WITH CORONARY ANGIOGRAM N/A 11/17/2014    Procedure: LEFT AND RIGHT HEART CATHETERIZATION WITH CORONARY ANGIOGRAM;  Surgeon: Troy Sine, MD;  Location[facility] CATH LAB;  Service: Cardiovascular;  Laterality: N/A;  . TOTAL ABDOMINAL HYSTERECTOMY  2004  . TUBAL LIGATION    . WISDOM TOOTH EXTRACTION  1986    Current Outpatient Medications  Medication Sig Dispense Refill  . acetaminophen (TYLENOL) 500 MG tablet Take 500 mg by mouth every 6 (six) hours as needed for moderate pain.    Marland Kitchen b complex vitamins tablet Take 1 tablet by mouth daily.    Marland Kitchen buPROPion (WELLBUTRIN XL) 150 MG 24 hr tablet TAKE 1 TABLET(150 MG) BY MOUTH DAILY 30 tablet 3  . carvedilol (COREG) 12.5 MG tablet TAKE 1 TABLET BY MOUTH TWICE DAILY WITH A MEAL 60 tablet 6  . Cholecalciferol (VITAMIN D PO) Take 2,000 mg by mouth daily.     . fluticasone (FLONASE) 50 MCG/ACT nasal spray Place 1 spray into both nostrils daily.    . furosemide (LASIX) 40 MG tablet Take 1 tablet (40 mg total) by mouth as needed. 30 tablet 11  . lisdexamfetamine (VYVANSE) 40 MG capsule Take 1 capsule (40 mg total) by mouth every morning. 30 capsule 0  . lisinopril (ZESTRIL) 10 MG tablet Take 1 tablet (10 mg total) by mouth daily. 30 tablet 3  . loratadine (CLARITIN) 10 MG tablet     .  potassium chloride (KLOR-CON) 10 MEQ tablet Take 10 mEq by mouth as needed (when take furosemide).    Marland Kitchen spironolactone (ALDACTONE) 25 MG tablet TAKE 1 TABLET(25 MG) BY MOUTH DAILY 30 tablet 11   No current facility-administered medications for this visit.    Allergies  Allergen Reactions  . Bee Venom Anaphylaxis  . Losartan Other (See Comments)    Pt reports cough and insomnia.  . Erythromycin Itching and Rash  . Lovastatin Other (See Comments)    weakness  . Penicillins Hives, Itching and Rash  . Simvastatin Other (See Comments)    weakness      Review of Systems negative except from HPI and PMH  Physical Exam BP 108/66   Pulse 71   Ht 5' 6.5" (1.689 m)   Wt 219 lb 9.6 oz (99.6 kg)   BMI 34.91  kg/m  Well developed and well nourished in no acute distress HENT normal Neck supple   Clear Device pocket well healed; without hematoma or erythema.  There is no tethering  Regular rate and rhythm, no murmur Abd-soft with active BS No Clubbing cyanosis   edema Skin-warm and dry A & Oriented  Grossly normal sensory and motor function  ECG sinus at 71 with P synchronous pacing Intervals 15/10/43 QRS morphology negative lead V1 and positive lead I  Assessment and  Plan  Nonischemic cardiomyopathy  Left bundle branch block  CRT-ICD Medtronic     Congestive heart failure-chronic diastolic  Dyspnea on exertion  Anxiety    Still with some modest dyspnea.  Mildly volume overloaded.  However, with her hypotension, diuretics have been an issue.  Hence, we will continue her on her ACE inhibitor, beta-blocker and aldosterone but I will cut all the doses in half.  This being the case, perhaps diuretics will be better tolerated when necessary.  Repeat echocardiogram is pending; last echo demonstrated normal and sustained normalization of LV function.

## 2020-04-20 NOTE — Patient Instructions (Signed)
Medication Instructions:  Your physician has recommended you make the following change in your medication:   ** Decrease your Carvedilol to 12.5mg  -  1/2 tablet by mouth twice daily.  ** Lisinopril 10mg  -  1/2 tablet by mouth daily  ** Spironolactone 25mg  - 1/2 tablet by mouth daily   Labwork: None ordered.  Testing/Procedures: None ordered.  Follow-Up: Your physician wants you to follow-up in: 12 months with Dr Caryl Comes. You will receive a reminder letter in the mail two months in advance. If you don't receive a letter, please call our office to schedule the follow-up appointment.  Remote monitoring is used to monitor your Pacemaker of ICD from home. This monitoring reduces the number of office visits required to check your device to one time per year. It allows [place] to keep an eye on the functioning of your device to ensure it is working properly.  Any Other Special Instructions Will Be Listed Below (If Applicable).  If you need a refill on your cardiac medications before your next appointment, please call your pharmacy.

## 2020-04-20 NOTE — Patient Instructions (Signed)
Debra Holloway , Thank you for taking time to come for your Medicare Wellness Visit. I appreciate your ongoing commitment to your health goals. Please review the following plan we discussed and let me know if I can assist you in the future.   Screening recommendations/referrals: Colonoscopy: Declined at this time. Mammogram: Declined at this time Bone Density: Not indicated until age 63 Recommended yearly ophthalmology/optometry visit for glaucoma screening and checkup Recommended yearly dental visit for hygiene and checkup  Vaccinations: Influenza vaccine:ue 06/2020  Pneumococcal vaccine: Discuss with PCP Tdap vaccine: Up to Date  Due 09/02/2023 Shingles vaccine: Discuss with pharmacy  Covid-19: Declined at this time  Advanced directives: You may obtain information from the office if interested.  Conditions/risks identified: See problem list  Next appointment: Follow up in one year for your annual wellness visit.   Preventive Care 40-64 Years, Female Preventive care refers to lifestyle choices and visits with your health care provider that can promote health and wellness. What does preventive care include?  A yearly physical exam. This is also called an annual well check.  Dental exams once or twice a year.  Routine eye exams. Ask your health care provider how often you should have your eyes checked.  Personal lifestyle choices, including:  Daily care of your teeth and gums.  Regular physical activity.  Eating a healthy diet.  Avoiding tobacco and drug use.  Limiting alcohol use.  Practicing safe sex.  Taking low-dose aspirin daily starting at age 60.  Taking vitamin and mineral supplements as recommended by your health care provider. What happens during an annual well check? The services and screenings done by your health care provider during your annual well check will depend on your age, overall health, lifestyle risk factors, and family history of  disease. Counseling  Your health care provider may ask you questions about your:  Alcohol use.  Tobacco use.  Drug use.  Emotional well-being.  Home and relationship well-being.  Sexual activity.  Eating habits.  Work and work Statistician.  Method of birth control.  Menstrual cycle.  Pregnancy history. Screening  You may have the following tests or measurements:  Height, weight, and BMI.  Blood pressure.  Lipid and cholesterol levels. These may be checked every 5 years, or more frequently if you are over 65 years old.  Skin check.  Lung cancer screening. You may have this screening every year starting at age 36 if you have a 30-pack-year history of smoking and currently smoke or have quit within the past 15 years.  Fecal occult blood test (FOBT) of the stool. You may have this test every year starting at age 68.  Flexible sigmoidoscopy or colonoscopy. You may have a sigmoidoscopy every 5 years or a colonoscopy every 10 years starting at age 27.  Hepatitis C blood test.  Hepatitis B blood test.  Sexually transmitted disease (STD) testing.  Diabetes screening. This is done by checking your blood sugar (glucose) after you have not eaten for a while (fasting). You may have this done every 1-3 years.  Mammogram. This may be done every 1-2 years. Talk to your health care provider about when you should start having regular mammograms. This may depend on whether you have a family history of breast cancer.  BRCA-related cancer screening. This may be done if you have a family history of breast, ovarian, tubal, or peritoneal cancers.  Pelvic exam and Pap test. This may be done every 3 years starting at age 67. Starting at age  30, this may be done every 5 years if you have a Pap test in combination with an HPV test.  Bone density scan. This is done to screen for osteoporosis. You may have this scan if you are at high risk for osteoporosis. Discuss your test results,  treatment options, and if necessary, the need for more tests with your health care provider. Vaccines  Your health care provider may recommend certain vaccines, such as:  Influenza vaccine. This is recommended every year.  Tetanus, diphtheria, and acellular pertussis (Tdap, Td) vaccine. You may need a Td booster every 10 years.  Zoster vaccine. You may need this after age 35.  Pneumococcal 13-valent conjugate (PCV13) vaccine. You may need this if you have certain conditions and were not previously vaccinated.  Pneumococcal polysaccharide (PPSV23) vaccine. You may need one or two doses if you smoke cigarettes or if you have certain conditions. Talk to your health care provider about which screenings and vaccines you need and how often you need them. This information is not intended to replace advice given to you by your health care provider. Make sure you discuss any questions you have with your health care provider. Document Released: 10/15/2015 Document Revised: 06/07/2016 Document Reviewed: 07/20/2015 Elsevier Interactive Patient Education  2017 [employer].    Fall Prevention in the Home Falls can cause injuries. They can happen to people of all ages. There are many things you can do to make your home safe and to help prevent falls. What can I do on the outside of my home?  Regularly fix the edges of walkways and driveways and fix any cracks.  Remove anything that might make you trip as you walk through a door, such as a raised step or threshold.  Trim any bushes or trees on the path to your home.  Use bright outdoor lighting.  Clear any walking paths of anything that might make someone trip, such as rocks or tools.  Regularly check to see if handrails are loose or broken. Make sure that both sides of any steps have handrails.  Any raised decks and porches should have guardrails on the edges.  Have any leaves, snow, or ice cleared regularly.  Use sand or salt on walking  paths during winter.  Clean up any spills in your garage right away. This includes oil or grease spills. What can I do in the bathroom?  Use night lights.  Install grab bars by the toilet and in the tub and shower. Do not use towel bars as grab bars.  Use non-skid mats or decals in the tub or shower.  If you need to sit down in the shower, use a plastic, non-slip stool.  Keep the floor dry. Clean up any water that spills on the floor as soon as it happens.  Remove soap buildup in the tub or shower regularly.  Attach bath mats securely with double-sided non-slip rug tape.  Do not have throw rugs and other things on the floor that can make you trip. What can I do in the bedroom?  Use night lights.  Make sure that you have a light by your bed that is easy to reach.  Do not use any sheets or blankets that are too big for your bed. They should not hang down onto the floor.  Have a firm chair that has side arms. You can use this for support while you get dressed.  Do not have throw rugs and other things on the floor that can  make you trip. What can I do in the kitchen?  Clean up any spills right away.  Avoid walking on wet floors.  Keep items that you use a lot in easy-to-reach places.  If you need to reach something above you, use a strong step stool that has a grab bar.  Keep electrical cords out of the way.  Do not use floor polish or wax that makes floors slippery. If you must use wax, use non-skid floor wax.  Do not have throw rugs and other things on the floor that can make you trip. What can I do with my stairs?  Do not leave any items on the stairs.  Make sure that there are handrails on both sides of the stairs and use them. Fix handrails that are broken or loose. Make sure that handrails are as long as the stairways.  Check any carpeting to make sure that it is firmly attached to the stairs. Fix any carpet that is loose or worn.  Avoid having throw rugs at  the top or bottom of the stairs. If you do have throw rugs, attach them to the floor with carpet tape.  Make sure that you have a light switch at the top of the stairs and the bottom of the stairs. If you do not have them, ask someone to add them for you. What else can I do to help prevent falls?  Wear shoes that:  Do not have high heels.  Have rubber bottoms.  Are comfortable and fit you well.  Are closed at the toe. Do not wear sandals.  If you use a stepladder:  Make sure that it is fully opened. Do not climb a closed stepladder.  Make sure that both sides of the stepladder are locked into place.  Ask someone to hold it for you, if possible.  Clearly mark and make sure that you can see:  Any grab bars or handrails.  First and last steps.  Where the edge of each step is.  Use tools that help you move around (mobility aids) if they are needed. These include:  Canes.  Walkers.  Scooters.  Crutches.  Turn on the lights when you go into a dark area. Replace any light bulbs as soon as they burn out.  Set up your furniture so you have a clear path. Avoid moving your furniture around.  If any of your floors are uneven, fix them.  If there are any pets around you, be aware of where they are.  Review your medicines with your doctor. Some medicines can make you feel dizzy. This can increase your chance of falling. Ask your doctor what other things that you can do to help prevent falls. This information is not intended to replace advice given to you by your health care provider. Make sure you discuss any questions you have with your health care provider. Document Released: 07/15/2009 Document Revised: 02/24/2016 Document Reviewed: 10/23/2014 Elsevier Interactive Patient Education  2017 [employer].

## 2020-04-21 ENCOUNTER — Telehealth: Payer: Self-pay | Admitting: Internal Medicine

## 2020-04-21 NOTE — Telephone Encounter (Signed)
Spoke with pt and advised new medication changes have been sent to pt's pharmacy.  AVS mailed to pt with updated MAR.  Pt verbalizes understanding and thanked Therapist, sports for call.

## 2020-04-21 NOTE — Telephone Encounter (Signed)
Patient returning call from [place].

## 2020-04-23 ENCOUNTER — Ambulatory Visit (HOSPITAL_COMMUNITY): Payer: Medicare Other | Attending: Cardiology

## 2020-04-23 ENCOUNTER — Other Ambulatory Visit: Payer: Self-pay

## 2020-04-23 DIAGNOSIS — I5042 Chronic combined systolic (congestive) and diastolic (congestive) heart failure: Secondary | ICD-10-CM | POA: Insufficient documentation

## 2020-04-23 DIAGNOSIS — I447 Left bundle-branch block, unspecified: Secondary | ICD-10-CM | POA: Diagnosis not present

## 2020-04-23 DIAGNOSIS — R5383 Other fatigue: Secondary | ICD-10-CM | POA: Insufficient documentation

## 2020-04-23 DIAGNOSIS — I480 Paroxysmal atrial fibrillation: Secondary | ICD-10-CM | POA: Insufficient documentation

## 2020-04-23 DIAGNOSIS — I428 Other cardiomyopathies: Secondary | ICD-10-CM | POA: Diagnosis not present

## 2020-04-23 DIAGNOSIS — E785 Hyperlipidemia, unspecified: Secondary | ICD-10-CM | POA: Diagnosis not present

## 2020-04-23 LAB — ECHOCARDIOGRAM COMPLETE
Area-P 1/2: 2.91 cm2
S' Lateral: 4 cm

## 2020-05-03 ENCOUNTER — Other Ambulatory Visit: Payer: Self-pay

## 2020-05-03 ENCOUNTER — Telehealth (INDEPENDENT_AMBULATORY_CARE_PROVIDER_SITE_OTHER): Payer: Medicare Other | Admitting: Physician Assistant

## 2020-05-03 ENCOUNTER — Encounter: Payer: Self-pay | Admitting: Physician Assistant

## 2020-05-03 DIAGNOSIS — F988 Other specified behavioral and emotional disorders with onset usually occurring in childhood and adolescence: Secondary | ICD-10-CM | POA: Diagnosis not present

## 2020-05-03 NOTE — Progress Notes (Signed)
Virtual Visit via Video   I connected with patient on 05/03/20 at  8:00 AM EDT by a video enabled telemedicine application and verified that I am speaking with the correct person using two identifiers.  Location patient: Home Location provider: [facility], Office Persons participating in the virtual visit: Patient, Provider, [place] (Debra Holloway)  I discussed the limitations of evaluation and management by telemedicine and the availability of in person appointments. The patient expressed understanding and agreed to proceed.  Subjective:   HPI:   Patient presents via [place] today for follow-up of ADD. Patient has been on a long-standing regimen of Vyvanse 40 mg daily, previously very well-controlled on current regimen. Patient endorses continuing to take her medication as directed. Notes tolerating well without any side effect. Notes mood has been stable overall. Is continuing to follow-up with the HF clinic as directed.   ROS:   See pertinent positives and negatives per HPI.  Patient Active Problem List   Diagnosis Date Noted  . ICD (implantable cardioverter-defibrillator) in place 04/19/2020  . Anxiety and depression 09/21/2016  . Visit for preventive health examination 06/18/2016  . NICM (nonischemic cardiomyopathy) ([place]) 08/16/2015  . OSA (obstructive sleep apnea) 03/18/2015  . Chronic systolic heart failure ([place]) 12/07/2014  . Nonischemic dilated cardiomyopathy ([place])   . LBBB (left bundle branch block)   . Dyslipidemia   . Hot flashes, menopausal 09/18/2014  . ADD (attention deficit disorder) 09/06/2013  . Environmental allergies 09/06/2013    Social History   Tobacco Use  . Smoking status: Never Smoker  . Smokeless tobacco: Never Used  Substance Use Topics  . Alcohol use: Yes    Alcohol/week: 4.0 standard drinks    Types: 4 Glasses of wine per week    Current Outpatient Medications:  .  acetaminophen (TYLENOL) 500 MG tablet, Take 500 mg by mouth  every 6 (six) hours as needed for moderate pain., Disp: , Rfl:  .  b complex vitamins tablet, Take 1 tablet by mouth daily., Disp: , Rfl:  .  buPROPion (WELLBUTRIN XL) 150 MG 24 hr tablet, TAKE 1 TABLET(150 MG) BY MOUTH DAILY, Disp: 30 tablet, Rfl: 3 .  carvedilol (COREG) 6.25 MG tablet, Take 1 tablet (6.25 mg total) by mouth 2 (two) times daily., Disp: 180 tablet, Rfl: 3 .  Cholecalciferol (VITAMIN D PO), Take 2,000 mg by mouth daily. , Disp: , Rfl:  .  fluticasone (FLONASE) 50 MCG/ACT nasal spray, Place 1 spray into both nostrils daily., Disp: , Rfl:  .  furosemide (LASIX) 40 MG tablet, Take 1 tablet (40 mg total) by mouth as needed., Disp: 30 tablet, Rfl: 11 .  lisdexamfetamine (VYVANSE) 40 MG capsule, Take 1 capsule (40 mg total) by mouth every morning., Disp: 30 capsule, Rfl: 0 .  lisinopril (ZESTRIL) 5 MG tablet, Take 1 tablet (5 mg total) by mouth daily., Disp: 90 tablet, Rfl: 3 .  loratadine (CLARITIN) 10 MG tablet, , Disp: , Rfl:  .  potassium chloride (KLOR-CON) 10 MEQ tablet, Take 10 mEq by mouth as needed (when take furosemide)., Disp: , Rfl:  .  spironolactone (ALDACTONE) 25 MG tablet, Take 0.5 tablets (12.5 mg total) by mouth daily., Disp: 45 tablet, Rfl: 3  Allergies  Allergen Reactions  . Bee Venom Anaphylaxis  . Losartan Other (See Comments)    Pt reports cough and insomnia.  . Erythromycin Itching and Rash  . Lovastatin Other (See Comments)    weakness  . Penicillins Hives, Itching and Rash  . Simvastatin  Other (See Comments)    weakness    Objective:   There were no vitals taken for this visit.  Patient is well-developed, well-nourished in no acute distress.  Resting comfortably at home.  Head is normocephalic, atraumatic.  No labored breathing.  Speech is clear and coherent with logical content.  Patient is alert and oriented at baseline.   Assessment and Plan:   1. Attention deficit disorder, unspecified hyperactivity presence Taking medication as  directed. BP stable, followed by Cardiology.  Good relief of symptoms. Will continue current regimen. Continue follow-up every 6 months. Sooner if needed. If becoming subtherapeutic would want to switch to non-stimulant versus increasing current dose of Vyvanse giving her history.     Leeanne Rio, PA-C 05/03/2020

## 2020-05-03 NOTE — Progress Notes (Signed)
I have discussed the procedure for the virtual visit with the patient who has given consent to proceed with assessment and treatment.   Debra Holloway, CMA     

## 2020-05-18 NOTE — Progress Notes (Deleted)
Cardiology Office Note    Date:  05/18/2020   ID:  AKIRA PERUSSE, DOB [birth year], MRN [id number]  PCP:  Brunetta Jeans, PA-C  Cardiologist: Ena Dawley, MD EPS: Virl Axe, MD  No chief complaint on file.   History of Present Illness:  Debra Holloway is a 64 y.o. female with history of NICM-2016 EF 15-21%, LHC normal coronaries, Medtronic CRT-D by Dr. Caryl Comes 08/2015, chronic combined CHF, EF 55-60% echo 12/2018, LBBB, dyslipidemia, untreated OSA, anxiety.  Patient saw Dr. Caryl Comes 04/20/2020 at which time she still has some dyspnea and mild fluid overload however with hypotension diuretics had been an issue.  He cut her ACE inhibitor beta-blocker and aldosterone in half.  Repeat echo unchanged EF 50 to 55%.  Reviewed by Dr. Meda Coffee.  Past Medical History:  Diagnosis Date  . ADD (attention deficit disorder)   . AICD (automatic cardioverter/defibrillator) present 08/23/15  . Anxiety    "just when my daddy died a few years ago" (08/23/15)  . AR (allergic rhinitis)   . Chicken pox   . Chronic combined systolic and diastolic CHF (congestive heart failure) ([place])   . Depression    "just when my daddy died a few years ago" (08/23/2015)  . Dyslipidemia   . Environmental allergies   . H/O vitamin D deficiency   . LBBB (left bundle branch block)    W/1st degree AV Block, Avoid Beta Blockers  . NICM (nonischemic cardiomyopathy) ([place])   . OSA (obstructive sleep apnea) 03/18/2015   Severe OSA with AHI 30/hr; "suppose to wear mask but don't" (08-23-15)  . Seasonal affective disorder [facility])     Past Surgical History:  Procedure Laterality Date  . CARDIAC CATHETERIZATION    . DILATION AND CURETTAGE OF UTERUS  2003  . EP IMPLANTABLE DEVICE N/A 08/23/2015   Procedure: BiV ICD Insertion CRT-D;  Surgeon: Deboraha Sprang, MD;  Location: [place] CV LAB;  Service: Cardiovascular;  Laterality: N/A;  . LEFT AND RIGHT HEART CATHETERIZATION WITH CORONARY ANGIOGRAM N/A 11/17/2014    Procedure: LEFT AND RIGHT HEART CATHETERIZATION WITH CORONARY ANGIOGRAM;  Surgeon: Troy Sine, MD;  Location: [facility] CATH LAB;  Service: Cardiovascular;  Laterality: N/A;  . TOTAL ABDOMINAL HYSTERECTOMY  2004  . TUBAL LIGATION    . WISDOM TOOTH EXTRACTION  1986    Current Medications: No outpatient medications have been marked as taking for the 05/19/20 encounter (Appointment) with Imogene Burn, PA-C.     Allergies:   Bee venom, Losartan, Erythromycin, Lovastatin, Penicillins, and Simvastatin   Social History   Socioeconomic History  . Marital status: Divorced    Spouse name: Not on file  . Number of children: Not on file  . Years of education: Not on file  . Highest education level: Not on file  Occupational History  . Not on file  Tobacco Use  . Smoking status: Never Smoker  . Smokeless tobacco: Never Used  Vaping Use  . Vaping Use: Never used  Substance and Sexual Activity  . Alcohol use: Yes    Alcohol/week: 4.0 standard drinks    Types: 4 Glasses of wine per week  . Drug use: No  . Sexual activity: Not Currently  Other Topics Concern  . Not on file  Social History Narrative  . Not on file   Social Determinants of Health   Financial Resource Strain: Low Risk   . Difficulty of Paying Living Expenses: Not hard at all  Food Insecurity: No Food Insecurity  .  Worried About Charity fundraiser in the Last Year: Never true  . Ran Out of Food in the Last Year: Never true  Transportation Needs: No Transportation Needs  . Lack of Transportation (Medical): No  . Lack of Transportation (Non-Medical): No  Physical Activity: Inactive  . Days of Exercise per Week: 0 days  . Minutes of Exercise per Session: 0 min  Stress: No Stress Concern Present  . Feeling of Stress : Not at all  Social Connections: Socially Isolated  . Frequency of Communication with Friends and Family: More than three times a week  . Frequency of Social Gatherings with Friends and Family: Once a week   . Attends Religious Services: Never  . Active Member of Clubs or Organizations: No  . Attends Archivist Meetings: Never  . Marital Status: Divorced     Family History:  The patient's ***family history includes Alzheimer's disease in her maternal grandfather; Arthritis/Rheumatoid in her maternal grandmother; Breast cancer (age of onset: 61) in her sister; COPD in her mother; Cancer in her paternal uncle; Healthy in her brother and daughter; Heart disease in her maternal grandmother and paternal grandmother; Hypertension in her paternal grandmother; Lung cancer in her paternal grandfather; Pancreatic cancer (age of onset: 1) in her father.   ROS:   Please see the history of present illness.    ROS All other systems reviewed and are negative.   PHYSICAL EXAM:   VS:  There were no vitals taken for this visit.  Physical Exam  GEN: Well nourished, well developed, in no acute distress  HEENT: normal  Neck: no JVD, carotid bruits, or masses Cardiac:RRR; no murmurs, rubs, or gallops  Respiratory:  clear to auscultation bilaterally, normal work of breathing GI: soft, nontender, nondistended, + BS Ext: without cyanosis, clubbing, or edema, Good distal pulses bilaterally MS: no deformity or atrophy  Skin: warm and dry, no rash Neuro:  Alert and Oriented x 3, Strength and sensation are intact Psych: euthymic mood, full affect  Wt Readings from Last 3 Encounters:  04/20/20 219 lb 9.6 oz (99.6 kg)  04/20/20 217 lb (98.4 kg)  01/29/20 217 lb 12.8 oz (98.8 kg)      Studies/Labs Reviewed:   EKG:  EKG is*** ordered today.  The ekg ordered today demonstrates ***  Recent Labs: 01/29/2020: ALT 10; BUN 16; Creatinine, Ser 1.02; Hemoglobin 12.8; Magnesium 2.2; NT-Pro BNP 116; Platelets 254; Potassium 4.2; Sodium 139; TSH 2.070   Lipid Panel    Component Value Date/Time   CHOL 224 (H) 01/29/2020 1036   TRIG 115 01/29/2020 1036   HDL 57 01/29/2020 1036   CHOLHDL 3.9 01/29/2020 1036    CHOLHDL 4 07/30/2018 1356   VLDL 24.4 07/30/2018 1356   LDLCALC 147 (H) 01/29/2020 1036    Additional studies/ records that were reviewed today include:  Echo 04/23/20 IMPRESSIONS     1. Left ventricular ejection fraction, by estimation, is 50 to 55%. The  left ventricle has low normal function. The left ventricle demonstrates  regional wall motion abnormalities (see scoring diagram/findings for  description). Left ventricular diastolic   parameters are indeterminate. There is akinesis of the left ventricular,  apical inferior wall. The average left ventricular global longitudinal  strain is -16.0 %.   2. Right ventricular systolic function is normal. The right ventricular  size is normal. There is normal pulmonary artery systolic pressure. The  estimated right ventricular systolic pressure is 16.1 mmHg.   3. The mitral valve is normal  in structure. Mild mitral valve  regurgitation. No evidence of mitral stenosis.   4. The aortic valve is normal in structure. Aortic valve regurgitation is  not visualized. No aortic stenosis is present.   5. The inferior vena cava is normal in size with greater than 50%  respiratory variability, suggesting right atrial pressure of 3 mmHg.      R/LHC 2016 PROCEDURE:   Right and left heart catheterization: Swan-Ganz catheterization , cardiac output determination by the thermodilution and Fick methods, coronary angiography, left ventriculography.   The patient was brought to the second floor [place] Cardiac cath lab in the postabsorptive state. Versed 2 mg and fentanyl 50 mcg were administered for conscious sedation. The right groin was prepped and draped in sterile fashion and a 5 [place] arterial sheath and 7 French venous sheath were inserted without difficulty. A Swan-Ganz catheter was advanced into the venous sheath and pressures were obtained in the right atrium, right ventricle, pulmonary artery, and pulmonary capillary wedge position. Cardiac  outputs were obtained by the thermodilution and assumed Fick methods. Oxygen saturation was obtained in the pulmonary artery and aorta. A pigtail catheter was inserted and simultaneous AO/PA pressures were recorded. The pigtail catheter was advanced into the left ventricle and simultaneous left ventricular and PCW pressures were recorded. Left ventriculography was performed in the RAO projection.  A left ventricle to aorta pullback was performed. The pigtail catheter was then removed and diagnostic catheterization to delineate the coronary anatomy was performed utilizing 5 French Judkins 4 left and right diagnostic catheters. All catheters were removed and the patient. Hemostasis was obtained by direct manual pressure. The patient tolerated the procedure well and returned to his room in satisfactory condition.    HEMODYNAMICS:    RA: a 8  V 4; Mean 5 RV: 45/6 PA: 45/15 PC: 22 AO: 117/62   LV: 110/21  PC: 22   LV: 110/20 AO: 110/73   Oxygen saturation in the aorta 93% and the pulmonary artery 56%  Cardiac output: 5.2 l/min (Thermo); 4.8 (Fick)  Cardiac index: 2.6 l/m/m2                2.4   ANGIOGRAPHY:    Left main:  Angiographically normal short vessel which bifurcated into the LAD and left circumflex coronary arteries.   LAD:  Angiographically normal vessel which gave rise to several septal perforating arteries and one major diagonal vessel which extended to the apex.   Left circumflex:  Angiographically normal vessel which gave rise to one marginal branch.    Right coronary artery:  Angiographically normal, dominant vessel which gave rise to a moderate size PDA and PLA vessel.   Left ventriculography  revealed a severely dilated left ventricle  With severe diffuse global hypokinesis and an ejection fraction less than or equal to 15%.     IMPRESSION:   Nonischemic cardiomyopathy with a severely dilated left ventricle with an ejection fraction of equal to or less than 15%.     Normal coronary arteries.   RECOMMENDATION:   Aggressive medical therapy with hope for improvement in LV function. Patient may require a temporary LifeVest prior to discharge and if LV function does not improved with  Therapy will need prophylactic ICD implantation.       Troy Sine, MD, [facility] 11/17/2014 12:12 PM     2D Echo 12/2018 IMPRESSIONS     1. The left ventricle has normal systolic function, with an ejection  fraction of 55-60%. The cavity size  was normal. There is mildly increased  left ventricular wall thickness. Left ventricular diastolic Doppler  parameters are indeterminate.   2. The right ventricle has normal systolic function. The cavity was  normal. There is no increase in right ventricular wall thickness.   3. The mitral valve is normal in structure.   4. The tricuspid valve is normal in structure.   5. The aortic valve is normal in structure.   6. LVEF is approximately 50 to 55% with septal hypokinesis and aneurysmal  dilitation of inferoapex with hypokinesis in that region.           ASSESSMENT:    1. NICM (nonischemic cardiomyopathy) ([place])   2. Chronic diastolic CHF (congestive heart failure) ([place])   3. LBBB (left bundle branch block)   4. ICD (implantable cardioverter-defibrillator) in place   5. Anxiety and depression      PLAN:  In order of problems listed above:  Nonischemic cardiomyopathy echo 04/23/20 50-55%, indeterminate diastolic parameters. Saw Dr. Caryl Comes and BP low so cut back meds as above so could tolerate lasix  Chronic diastolic CHF   Left bundle branch block   CRT-ICD Medtronic followed by Dr. Caryl Comes   Anxiety               Medication Adjustments/Labs and Tests Ordered: Current medicines are reviewed at length with the patient today.  Concerns regarding medicines are outlined above.  Medication changes, Labs and Tests ordered today are listed in the Patient Instructions below. There are no Patient Instructions on  file for this visit.   Sumner Boast, PA-C  05/18/2020 9:51 AM    [facility] [place], [place],   [address] Phone: [phone]; Fax: [phone]

## 2020-05-19 ENCOUNTER — Telehealth: Payer: Self-pay

## 2020-05-19 ENCOUNTER — Ambulatory Visit: Payer: Medicare Other | Admitting: Physician Assistant

## 2020-05-19 NOTE — Telephone Encounter (Signed)
Pt called today asking if she could switch her appt to VV. She stated that she didn't feel like she needed this appt because she recently seen SK and he adjusted her medications and she was feeling much better since then. Her BP is better, her weight is down and she does not report having any symptoms. After speaking with ML, I made her a f/u appt with Dr. Meda Coffee on 08/11/2020. Pt was advised to contact us if she has any questions or concerns before her next appt.

## 2020-05-22 ENCOUNTER — Encounter: Payer: Self-pay | Admitting: Physician Assistant

## 2020-05-22 DIAGNOSIS — F988 Other specified behavioral and emotional disorders with onset usually occurring in childhood and adolescence: Secondary | ICD-10-CM

## 2020-05-24 MED ORDER — LISDEXAMFETAMINE DIMESYLATE 40 MG PO CAPS: 40.0000 mg | ORAL_CAPSULE | ORAL | 0 refills | Status: DC

## 2020-06-23 ENCOUNTER — Encounter: Payer: Self-pay | Admitting: Physician Assistant

## 2020-06-23 ENCOUNTER — Other Ambulatory Visit: Payer: Self-pay

## 2020-06-23 DIAGNOSIS — F988 Other specified behavioral and emotional disorders with onset usually occurring in childhood and adolescence: Secondary | ICD-10-CM

## 2020-06-23 NOTE — Telephone Encounter (Signed)
Good afternoon. I would like to get my Vyvanse refilled in the next couple of days before the weekend if possible. Thank you  LFD 05/24/20 #30 with no refills LOV 05/03/20 NOV none

## 2020-06-24 MED ORDER — LISDEXAMFETAMINE DIMESYLATE 40 MG PO CAPS: 40.0000 mg | ORAL_CAPSULE | ORAL | 0 refills | Status: DC

## 2020-06-30 ENCOUNTER — Telehealth: Payer: Self-pay | Admitting: Physician Assistant

## 2020-06-30 NOTE — Progress Notes (Signed)
°  Chronic Care Management   Note  06/30/2020 Name: Debra Holloway MRN: [id number] DOB: 24-Oct-1955  Debra Holloway is a 64 y.o. year old female who is a primary care patient of Delorse Limber. I reached out to [name] by phone today in response to a referral sent by Ms. Nena Jordan Kitzmiller's PCP, Brunetta Jeans, PA-C.   Ms. Justus was given information about Chronic Care Management services today including:  1. CCM service includes personalized support from designated clinical staff supervised by her physician, including individualized plan of care and coordination with other care providers 2. 24/7 contact phone numbers for assistance for urgent and routine care needs. 3. Service will only be billed when office clinical staff spend 20 minutes or more in a month to coordinate care. 4. Only one practitioner may furnish and bill the service in a calendar month. 5. The patient may stop CCM services at any time (effective at the end of the month) by phone call to the office staff.   Patient agreed to services and verbal consent obtained.   Follow up plan:   Carley Perdue [employer] Scheduler

## 2020-07-19 ENCOUNTER — Ambulatory Visit (INDEPENDENT_AMBULATORY_CARE_PROVIDER_SITE_OTHER): Payer: Medicare Other

## 2020-07-19 DIAGNOSIS — I428 Other cardiomyopathies: Secondary | ICD-10-CM

## 2020-07-20 LAB — CUP PACEART REMOTE DEVICE CHECK
Battery Remaining Longevity: 25 mo
Battery Voltage: 2.93 V
Brady Statistic AP VP Percent: 0.29 %
Brady Statistic AP VS Percent: 0.02 %
Brady Statistic AS VP Percent: 97.12 %
Brady Statistic AS VS Percent: 2.56 %
Brady Statistic RA Percent Paced: 0.31 %
Brady Statistic RV Percent Paced: 97.18 %
Date Time Interrogation Session: 20211018113216
HighPow Impedance: 83 Ohm
Implantable Lead Implant Date: 20161114
Implantable Lead Implant Date: 20161114
Implantable Lead Implant Date: 20161114
Implantable Lead Location: 753858
Implantable Lead Location: 753859
Implantable Lead Location: 753860
Implantable Lead Model: 4598
Implantable Lead Model: 5076
Implantable Pulse Generator Implant Date: 20161114
Lead Channel Impedance Value: 342 Ohm
Lead Channel Impedance Value: 361 Ohm
Lead Channel Impedance Value: 361 Ohm
Lead Channel Impedance Value: 418 Ohm
Lead Channel Impedance Value: 456 Ohm
Lead Channel Impedance Value: 475 Ohm
Lead Channel Impedance Value: 589 Ohm
Lead Channel Impedance Value: 608 Ohm
Lead Channel Impedance Value: 665 Ohm
Lead Channel Impedance Value: 760 Ohm
Lead Channel Impedance Value: 893 Ohm
Lead Channel Impedance Value: 931 Ohm
Lead Channel Impedance Value: 950 Ohm
Lead Channel Pacing Threshold Amplitude: 0.625 V
Lead Channel Pacing Threshold Amplitude: 0.625 V
Lead Channel Pacing Threshold Amplitude: 1 V
Lead Channel Pacing Threshold Pulse Width: 0.4 ms
Lead Channel Pacing Threshold Pulse Width: 0.4 ms
Lead Channel Pacing Threshold Pulse Width: 0.4 ms
Lead Channel Sensing Intrinsic Amplitude: 2 mV
Lead Channel Sensing Intrinsic Amplitude: 2 mV
Lead Channel Sensing Intrinsic Amplitude: 23.875 mV
Lead Channel Sensing Intrinsic Amplitude: 23.875 mV
Lead Channel Setting Pacing Amplitude: 2 V
Lead Channel Setting Pacing Amplitude: 2.5 V
Lead Channel Setting Pacing Amplitude: 2.5 V
Lead Channel Setting Pacing Pulse Width: 0.4 ms
Lead Channel Setting Pacing Pulse Width: 0.4 ms
Lead Channel Setting Sensing Sensitivity: 0.45 mV

## 2020-07-26 NOTE — Progress Notes (Signed)
Remote ICD transmission.   

## 2020-08-03 ENCOUNTER — Telehealth: Payer: Self-pay

## 2020-08-03 NOTE — Progress Notes (Signed)
Chronic Care Management Pharmacy Assistant   Name: Debra Holloway  MRN: [id number] DOB: 26-Apr-1956  Reason for Encounter: Initial Visit  Patient Questions:  1.  Have you seen any other providers since your last visit?  Patient stated she has seen electrophysiologist Dr.Cline and cardiologist Dr. Meda Coffee since last visit.   2.  Any changes in your medicines or health?  Patient stated medication dose was lowered on all of her medications due to low blood pressure.   Debra Holloway,  64 y.o. , female presents for their Initial CCM visit with the clinical pharmacist via telephone.  PCP : Brunetta Jeans, PA-C  Allergies:   Allergies  Allergen Reactions  . Bee Venom Anaphylaxis  . Losartan Other (See Comments)    Pt reports cough and insomnia.  . Erythromycin Itching and Rash  . Lovastatin Other (See Comments)    weakness  . Penicillins Hives, Itching and Rash  . Simvastatin Other (See Comments)    weakness    Medications: Outpatient Encounter Medications as of 08/03/2020  Medication Sig  . acetaminophen (TYLENOL) 500 MG tablet Take 500 mg by mouth every 6 (six) hours as needed for moderate pain.  Marland Kitchen b complex vitamins tablet Take 1 tablet by mouth daily.  Marland Kitchen buPROPion (WELLBUTRIN XL) 150 MG 24 hr tablet TAKE 1 TABLET(150 MG) BY MOUTH DAILY  . carvedilol (COREG) 6.25 MG tablet Take 1 tablet (6.25 mg total) by mouth 2 (two) times daily.  . Cholecalciferol (VITAMIN D PO) Take 2,000 mg by mouth daily.   . fluticasone (FLONASE) 50 MCG/ACT nasal spray Place 1 spray into both nostrils daily.  . furosemide (LASIX) 40 MG tablet Take 1 tablet (40 mg total) by mouth as needed.  Marland Kitchen lisdexamfetamine (VYVANSE) 40 MG capsule Take 1 capsule (40 mg total) by mouth every morning.  Marland Kitchen lisinopril (ZESTRIL) 5 MG tablet Take 1 tablet (5 mg total) by mouth daily.  Marland Kitchen loratadine (CLARITIN) 10 MG tablet   . potassium chloride (KLOR-CON) 10 MEQ tablet Take 10 mEq by mouth as needed (when take  furosemide).  Marland Kitchen spironolactone (ALDACTONE) 25 MG tablet Take 0.5 tablets (12.5 mg total) by mouth daily.   No facility-administered encounter medications on file as of 08/03/2020.    Current Diagnosis: Patient Active Problem List   Diagnosis Date Noted  . ICD (implantable cardioverter-defibrillator) in place 04/19/2020  . Anxiety and depression 09/21/2016  . Visit for preventive health examination 06/18/2016  . NICM (nonischemic cardiomyopathy) ([place]) 08/16/2015  . OSA (obstructive sleep apnea) 03/18/2015  . Chronic systolic heart failure ([place]) 12/07/2014  . Nonischemic dilated cardiomyopathy ([place])   . LBBB (left bundle branch block)   . Dyslipidemia   . Hot flashes, menopausal 09/18/2014  . ADD (attention deficit disorder) 09/06/2013  . Environmental allergies 09/06/2013   Have you seen any other providers since your last visit? Patient states she has seen electrophysiologist Dr. Cleda Mccreedy and cardiologist Dr. Meda Coffee since last visit.  Any changes in your medications or health? Patient states medication dose was lowered on all of her medications due to having low blood pressure.  Any side effects from any medications? Patient states she has no side effects from her medications at this time.    Do you have an symptoms or problems not managed by your medications? Patient states she has no symptoms or problems at this time.   Any concerns about your health right now?  Patient states she has no concerns about her health right now.  Has your provider asked that you check blood pressure, blood sugar, or follow special diet at home?  Patient states she is on a low sodium diet and she is restricted to 64 ounces of fluid intake per day. Patient states she does not check blood pressure or blood sugar at home at this time.  Do you get any type of exercise on a regular basis? Patient states she occasionally walks up and down a flight of stairs.  Can you think of a goal you would like to  reach for your health?  Patient states she would like to work on weight loss.  Do you have any problems getting your medications?  Patient states she does not have any problems getting her medications at this time.  Is there anything that you would like to discuss during the appointment? Patient states she does not have anything to discuss at this time.   Please bring medications and supplements to appointment   Georgiana Shore ,[facility] Clinical Pharmacist Assistant [phone]  Follow-Up:  Pharmacist Review

## 2020-08-06 ENCOUNTER — Encounter: Payer: Self-pay | Admitting: Physician Assistant

## 2020-08-06 ENCOUNTER — Ambulatory Visit: Payer: Medicare Other

## 2020-08-06 DIAGNOSIS — F988 Other specified behavioral and emotional disorders with onset usually occurring in childhood and adolescence: Secondary | ICD-10-CM

## 2020-08-06 MED ORDER — LISDEXAMFETAMINE DIMESYLATE 40 MG PO CAPS: 40.0000 mg | ORAL_CAPSULE | ORAL | 0 refills | Status: DC

## 2020-08-06 NOTE — Telephone Encounter (Signed)
Vyvanse last rx 06/24/20 #30 LOV: 05/03/20 ADD

## 2020-08-11 ENCOUNTER — Ambulatory Visit: Payer: Medicare Other | Admitting: Cardiology

## 2020-09-06 ENCOUNTER — Encounter: Payer: Self-pay | Admitting: Physician Assistant

## 2020-09-06 ENCOUNTER — Other Ambulatory Visit: Payer: Self-pay | Admitting: Emergency Medicine

## 2020-09-06 DIAGNOSIS — F988 Other specified behavioral and emotional disorders with onset usually occurring in childhood and adolescence: Secondary | ICD-10-CM

## 2020-09-06 NOTE — Telephone Encounter (Signed)
Vyvanse last rx 08/06/20 #30 LOV: 05/03/20 ADD

## 2020-09-07 MED ORDER — LISDEXAMFETAMINE DIMESYLATE 40 MG PO CAPS: 40.0000 mg | ORAL_CAPSULE | ORAL | 0 refills | Status: DC

## 2020-10-04 ENCOUNTER — Ambulatory Visit: Payer: Medicare Other | Admitting: Nurse Practitioner

## 2020-10-11 ENCOUNTER — Encounter: Payer: Self-pay | Admitting: Physician Assistant

## 2020-10-11 ENCOUNTER — Other Ambulatory Visit: Payer: Self-pay | Admitting: Emergency Medicine

## 2020-10-11 DIAGNOSIS — F988 Other specified behavioral and emotional disorders with onset usually occurring in childhood and adolescence: Secondary | ICD-10-CM

## 2020-10-11 MED ORDER — LISDEXAMFETAMINE DIMESYLATE 40 MG PO CAPS: 40.0000 mg | ORAL_CAPSULE | ORAL | 0 refills | Status: DC

## 2020-10-11 NOTE — Telephone Encounter (Signed)
Vyvanse last rx 09/07/20 #30 LOV: 05/03/20 ADD

## 2020-10-14 ENCOUNTER — Ambulatory Visit: Payer: Medicare Other | Admitting: Physician Assistant

## 2020-10-18 ENCOUNTER — Ambulatory Visit (INDEPENDENT_AMBULATORY_CARE_PROVIDER_SITE_OTHER): Payer: Medicare Other

## 2020-10-18 DIAGNOSIS — I428 Other cardiomyopathies: Secondary | ICD-10-CM | POA: Diagnosis not present

## 2020-10-18 DIAGNOSIS — I5022 Chronic systolic (congestive) heart failure: Secondary | ICD-10-CM

## 2020-10-19 NOTE — Telephone Encounter (Signed)
Would recommend to have her see her PCP for further evaluation and if they feel cardiac related please schedule appt. Thanks

## 2020-10-19 NOTE — Telephone Encounter (Signed)
Transmission received on 10/16/20 just before 7pm, shows Normal device function, no arrhythmias logged.  HR at time of transmission was AS/ BiVP 100 bpm.  Nothing appears on the transmission to explain her symptoms.

## 2020-10-20 ENCOUNTER — Other Ambulatory Visit: Payer: Self-pay | Admitting: Emergency Medicine

## 2020-10-20 DIAGNOSIS — F419 Anxiety disorder, unspecified: Secondary | ICD-10-CM

## 2020-10-20 LAB — CUP PACEART REMOTE DEVICE CHECK
Battery Remaining Longevity: 21 mo
Battery Voltage: 2.92 V
Brady Statistic AP VP Percent: 0.36 %
Brady Statistic AP VS Percent: 0.02 %
Brady Statistic AS VP Percent: 99.53 %
Brady Statistic AS VS Percent: 0.08 %
Brady Statistic RA Percent Paced: 0.38 %
Brady Statistic RV Percent Paced: 99.87 %
Date Time Interrogation Session: 20220117061605
HighPow Impedance: 85 Ohm
Implantable Lead Implant Date: 20161114
Implantable Lead Implant Date: 20161114
Implantable Lead Implant Date: 20161114
Implantable Lead Location: 753858
Implantable Lead Location: 753859
Implantable Lead Location: 753860
Implantable Lead Model: 4598
Implantable Lead Model: 5076
Implantable Pulse Generator Implant Date: 20161114
Lead Channel Impedance Value: 342 Ohm
Lead Channel Impedance Value: 399 Ohm
Lead Channel Impedance Value: 399 Ohm
Lead Channel Impedance Value: 456 Ohm
Lead Channel Impedance Value: 456 Ohm
Lead Channel Impedance Value: 513 Ohm
Lead Channel Impedance Value: 646 Ohm
Lead Channel Impedance Value: 646 Ohm
Lead Channel Impedance Value: 760 Ohm
Lead Channel Impedance Value: 817 Ohm
Lead Channel Impedance Value: 893 Ohm
Lead Channel Impedance Value: 988 Ohm
Lead Channel Impedance Value: 988 Ohm
Lead Channel Pacing Threshold Amplitude: 0.625 V
Lead Channel Pacing Threshold Amplitude: 0.625 V
Lead Channel Pacing Threshold Amplitude: 1.125 V
Lead Channel Pacing Threshold Pulse Width: 0.4 ms
Lead Channel Pacing Threshold Pulse Width: 0.4 ms
Lead Channel Pacing Threshold Pulse Width: 0.4 ms
Lead Channel Sensing Intrinsic Amplitude: 1.5 mV
Lead Channel Sensing Intrinsic Amplitude: 1.5 mV
Lead Channel Sensing Intrinsic Amplitude: 24.375 mV
Lead Channel Sensing Intrinsic Amplitude: 24.375 mV
Lead Channel Setting Pacing Amplitude: 2 V
Lead Channel Setting Pacing Amplitude: 2.5 V
Lead Channel Setting Pacing Amplitude: 2.75 V
Lead Channel Setting Pacing Pulse Width: 0.4 ms
Lead Channel Setting Pacing Pulse Width: 0.4 ms
Lead Channel Setting Sensing Sensitivity: 0.45 mV

## 2020-10-20 MED ORDER — BUPROPION HCL ER (XL) 150 MG PO TB24: ORAL_TABLET | ORAL | 3 refills | Status: DC

## 2020-11-01 NOTE — Progress Notes (Signed)
Remote ICD transmission.   

## 2020-11-15 ENCOUNTER — Encounter: Payer: Self-pay | Admitting: Physician Assistant

## 2020-11-16 ENCOUNTER — Other Ambulatory Visit: Payer: Self-pay | Admitting: Emergency Medicine

## 2020-11-16 DIAGNOSIS — F988 Other specified behavioral and emotional disorders with onset usually occurring in childhood and adolescence: Secondary | ICD-10-CM

## 2020-11-16 MED ORDER — LISDEXAMFETAMINE DIMESYLATE 40 MG PO CAPS: 40.0000 mg | ORAL_CAPSULE | ORAL | 0 refills | Status: DC

## 2020-11-16 NOTE — Telephone Encounter (Signed)
Vyvanse last rx 10/11/20 #30 LOV: 05/03/20 ADD

## 2020-11-24 NOTE — Progress Notes (Signed)
Cardiology Office Note    Date:  12/01/2020   ID:  [name], Nippert Jan 08, 1956, MRN [id number]   PCP:  Martin, William C, [place]  Cardiologist:  Ena Dawley, MD  Advanced Practice Provider:  No care team member to display Electrophysiologist:  Virl Axe, MD   [phone]   Chief Complaint  Patient presents with  . Follow-up    History of Present Illness:  Debra Holloway is a 65 y.o. female with history of nonischemic cardiomyopathy status post CRT-D Medtronic 08/2015, chronic combined CHF, LBBB, HLD, untreated OSA, subclinical AAA , anxiety depression,  She did not tolerate losartan due to headaches. She previously had a dry cough with lisinopril but this has resolved. Regarding AF, last CHF note indicated short runs of AF. [place] not mentioned in last cardiology note. The last device interrogation that noted this was in 03/2019 reporting the longest episode was 1.54 minutes. She has OSA but has been unable to tolerate any kind of treatment for this including nasal prong due to her anxiety.  Patient sent in a transmission 10/18/2020 when he felt like everything went black and her heart was pounding between his ears while taking a bath. She never lost consciousness.  She was very weak afterwards.  Transmission was normal and she was told to follow-up with his PCP.  Patient says she hasn't felt well since. BP was low that day. BP higher today than usually she says is because she rushed here. Took a fluid pill yesterday for edema and BP low last night 106/59. No regular exercise through winter.  Past Medical History:  Diagnosis Date  . ADD (attention deficit disorder)   . AICD (automatic cardioverter/defibrillator) present 2015-08-21  . Anxiety    "just when my daddy died a few years ago" (21-Aug-2015)  . AR (allergic rhinitis)   . Chicken pox   . Chronic combined systolic and diastolic CHF (congestive heart failure) ([place])   . Depression     "just when my daddy died a few years ago" (08-21-15)  . Dyslipidemia   . Environmental allergies   . H/O vitamin D deficiency   . LBBB (left bundle branch block)    W/1st degree AV Block, Avoid Beta Blockers  . NICM (nonischemic cardiomyopathy) ([place])   . OSA (obstructive sleep apnea) 03/18/2015   Severe OSA with AHI 30/hr; "suppose to wear mask but don't" (08/21/15)  . Seasonal affective disorder [facility])     Past Surgical History:  Procedure Laterality Date  . CARDIAC CATHETERIZATION    . DILATION AND CURETTAGE OF UTERUS  2003  . EP IMPLANTABLE DEVICE N/A 2015-08-21   Procedure: BiV ICD Insertion CRT-D;  Surgeon: Deboraha Sprang, MD;  Location: [place] CV LAB;  Service: Cardiovascular;  Laterality: N/A;  . LEFT AND RIGHT HEART CATHETERIZATION WITH CORONARY ANGIOGRAM N/A 11/17/2014   Procedure: LEFT AND RIGHT HEART CATHETERIZATION WITH CORONARY ANGIOGRAM;  Surgeon: Troy Sine, MD;  Location: [facility] CATH LAB;  Service: Cardiovascular;  Laterality: N/A;  . TOTAL ABDOMINAL HYSTERECTOMY  2004  . TUBAL LIGATION    . WISDOM TOOTH EXTRACTION  1986    Current Medications: No outpatient medications have been marked as taking for the 12/01/20 encounter (Office Visit) with Imogene Burn, PA-C.     Allergies:   Bee venom, Losartan, Erythromycin, Lovastatin, Penicillins, and Simvastatin   Social History   Socioeconomic History  . Marital status: Divorced    Spouse name: Not  on file  . Number of children: Not on file  . Years of education: Not on file  . Highest education level: Not on file  Occupational History  . Not on file  Tobacco Use  . Smoking status: Never Smoker  . Smokeless tobacco: Never Used  Vaping Use  . Vaping Use: Never used  Substance and Sexual Activity  . Alcohol use: Yes    Alcohol/week: 4.0 standard drinks    Types: 4 Glasses of wine per week  . Drug use: No  . Sexual activity: Not Currently  Other Topics Concern  . Not on file  Social History Narrative   . Not on file   Social Determinants of Health   Financial Resource Strain: Low Risk   . Difficulty of Paying Living Expenses: Not hard at all  Food Insecurity: No Food Insecurity  . Worried About Charity fundraiser in the Last Year: Never true  . Ran Out of Food in the Last Year: Never true  Transportation Needs: No Transportation Needs  . Lack of Transportation (Medical): No  . Lack of Transportation (Non-Medical): No  Physical Activity: Inactive  . Days of Exercise per Week: 0 days  . Minutes of Exercise per Session: 0 min  Stress: No Stress Concern Present  . Feeling of Stress : Not at all  Social Connections: Socially Isolated  . Frequency of Communication with Friends and Family: More than three times a week  . Frequency of Social Gatherings with Friends and Family: Once a week  . Attends Religious Services: Never  . Active Member of Clubs or Organizations: No  . Attends Archivist Meetings: Never  . Marital Status: Divorced     Family History:  The patient's family history includes Alzheimer's disease in her maternal grandfather; Arthritis/Rheumatoid in her maternal grandmother; Breast cancer (age of onset: 31) in her sister; COPD in her mother; Cancer in her paternal uncle; Healthy in her brother and daughter; Heart disease in her maternal grandmother and paternal grandmother; Hypertension in her paternal grandmother; Lung cancer in her paternal grandfather; Pancreatic cancer (age of onset: 68) in her father.   ROS:   Please see the history of present illness.    ROS All other systems reviewed and are negative.   PHYSICAL EXAM:   VS:  BP 132/74   Pulse 66   Ht 5\' 6"  (1.676 m)   Wt 222 lb 6.4 oz (100.9 kg)   SpO2 98%   BMI 35.90 kg/m   Physical Exam  AVW:UJWJX, in no acute distress  Neck: no JVD, carotid bruits, or masses Cardiac:RRR; no murmurs, rubs, or gallops  Respiratory:  clear to auscultation bilaterally, normal work of breathing GI: soft,  nontender, nondistended, + BS Ext: without cyanosis, clubbing, or edema, Good distal pulses bilaterally Neuro:  Alert and Oriented x 3 Psych: euthymic mood, full affect  Wt Readings from Last 3 Encounters:  12/01/20 222 lb 6.4 oz (100.9 kg)  04/20/20 219 lb 9.6 oz (99.6 kg)  04/20/20 217 lb (98.4 kg)      Studies/Labs Reviewed:   EKG:  EKG is  ordered today.  The ekg ordered today demonstrates V paced, no change  Recent Labs: 01/29/2020: ALT 10; BUN 16; Creatinine, Ser 1.02; Hemoglobin 12.8; Magnesium 2.2; NT-Pro BNP 116; Platelets 254; Potassium 4.2; Sodium 139; TSH 2.070   Lipid Panel    Component Value Date/Time   CHOL 224 (H) 01/29/2020 1036   TRIG 115 01/29/2020 1036   HDL 57  01/29/2020 1036   CHOLHDL 3.9 01/29/2020 1036   CHOLHDL 4 07/30/2018 1356   VLDL 24.4 07/30/2018 1356   LDLCALC 147 (H) 01/29/2020 1036    Additional studies/ records that were reviewed today include:  [facility] 2016 PROCEDURE:   Right and left heart catheterization: Swan-Ganz catheterization , cardiac output determination by the thermodilution and Fick methods, coronary angiography, left ventriculography.   The patient was brought to the second floor [place] Cardiac cath lab in the postabsorptive state. Versed 2 mg and fentanyl 50 mcg were administered for conscious sedation. The right groin was prepped and draped in sterile fashion and a 5 [place] arterial sheath and 7 French venous sheath were inserted without difficulty. A Swan-Ganz catheter was advanced into the venous sheath and pressures were obtained in the right atrium, right ventricle, pulmonary artery, and pulmonary capillary wedge position. Cardiac outputs were obtained by the thermodilution and assumed Fick methods. Oxygen saturation was obtained in the pulmonary artery and aorta. A pigtail catheter was inserted and simultaneous AO/PA pressures were recorded. The pigtail catheter was advanced into the left ventricle and simultaneous left  ventricular and PCW pressures were recorded. Left ventriculography was performed in the RAO projection.  A left ventricle to aorta pullback was performed. The pigtail catheter was then removed and diagnostic catheterization to delineate the coronary anatomy was performed utilizing 5 French Judkins 4 left and right diagnostic catheters. All catheters were removed and the patient. Hemostasis was obtained by direct manual pressure. The patient tolerated the procedure well and returned to his room in satisfactory condition.    HEMODYNAMICS:    RA: a 8  V 4; Mean 5 RV: 45/6 PA: 45/15 PC: 22 AO: 117/62   LV: 110/21  PC: 22   LV: 110/20 AO: 110/73   Oxygen saturation in the aorta 93% and the pulmonary artery 56%  Cardiac output: 5.2 l/min (Thermo); 4.8 (Fick)  Cardiac index: 2.6 l/m/m2                2.4   ANGIOGRAPHY:    Left main:  Angiographically normal short vessel which bifurcated into the LAD and left circumflex coronary arteries.   LAD:  Angiographically normal vessel which gave rise to several septal perforating arteries and one major diagonal vessel which extended to the apex.   Left circumflex:  Angiographically normal vessel which gave rise to one marginal branch.    Right coronary artery:  Angiographically normal, dominant vessel which gave rise to a moderate size PDA and PLA vessel.   Left ventriculography  revealed a severely dilated left ventricle  With severe diffuse global hypokinesis and an ejection fraction less than or equal to 15%.     IMPRESSION:   Nonischemic cardiomyopathy with a severely dilated left ventricle with an ejection fraction of equal to or less than 15%.   Normal coronary arteries.   RECOMMENDATION:   Aggressive medical therapy with hope for improvement in LV function. Patient may require a temporary LifeVest prior to discharge and if LV function does not improved with  Therapy will need prophylactic ICD implantation.       Troy Sine,  MD, [facility] 11/17/2014 12:12 PM     2D Echo 12/2018 IMPRESSIONS     1. The left ventricle has normal systolic function, with an ejection  fraction of 55-60%. The cavity size was normal. There is mildly increased  left ventricular wall thickness. Left ventricular diastolic Doppler  parameters are indeterminate.   2. The right ventricle has  normal systolic function. The cavity was  normal. There is no increase in right ventricular wall thickness.   3. The mitral valve is normal in structure.   4. The tricuspid valve is normal in structure.   5. The aortic valve is normal in structure.   6. LVEF is approximately 50 to 55% with septal hypokinesis and aneurysmal  dilitation of inferoapex with hypokinesis in that region.        Risk Assessment/Calculations:         ASSESSMENT:    1. NICM (nonischemic cardiomyopathy) ([place])   2. Dizzy   3. LBBB (left bundle branch block)   4. ICD (implantable cardioverter-defibrillator) in place   5. Chronic combined systolic and diastolic CHF (congestive heart failure) ([place])   6. Anxiety and depression   7. Fatigue, unspecified type      PLAN:  In order of problems listed above:  Nonischemic cardiomyopathy LVEF 50-55% low normal on echo 04/23/20 unchanged on coreg, lisinopril and spironolactone. Continue current meds and check surveillance labs.  Dizzy spell in Jan may have been due to low BP. Remote device check showed no arrhythmias or abnormalities. She'll keep track of her BP and let us know what they are running at home. May need to lower meds further.   Left bundle branch block   CRT-ICD Medtronic followed by Dr. Caryl Comes.   Congestive heart failure-chronic diastolic compensated    Anxiety      Shared Decision Making/Informed Consent        Medication Adjustments/Labs and Tests Ordered: Current medicines are reviewed at length with the patient today.  Concerns regarding medicines are outlined above.  Medication changes, Labs and  Tests ordered today are listed in the Patient Instructions below. Patient Instructions  Medication Instructions:  Your physician recommends that you continue on your current medications as directed. Please refer to the Current Medication list given to you today.  *If you need a refill on your cardiac medications before your next appointment, please call your pharmacy*   Lab Work: TODAY: CMET, CBC, TSH, FLP  If you have labs (blood work) drawn today and your tests are completely normal, you will receive your results only by: Marland Kitchen MyChart Message (if you have MyChart) OR . A paper copy in the mail If you have any lab test that is abnormal or we need to change your treatment, we will call you to review the results.   Follow-Up: At [facility], you and your health needs are our priority.  As part of our continuing mission to provide you with exceptional heart care, we have created designated Provider Care Teams.  These Care Teams include your primary Cardiologist (physician) and Advanced Practice Providers (APPs -  Physician Assistants and Nurse Practitioners) who all work together to provide you with the care you need, when you need it.  Your next appointment:   6 month(s)  The format for your next appointment:   In Person  Provider:   You may see Gwyndolyn Kaufman, MD or one of the following Advanced Practice Providers on your designated Care Team:        Signed, Ermalinda Barrios, Hershal Coria  12/01/2020 8:41 AM    [address], [place], [place]  [address] Phone: [phone]; Fax: [phone]

## 2020-12-01 ENCOUNTER — Ambulatory Visit: Payer: Medicare Other | Admitting: Physician Assistant

## 2020-12-01 ENCOUNTER — Other Ambulatory Visit: Payer: Self-pay

## 2020-12-01 ENCOUNTER — Encounter: Payer: Self-pay | Admitting: Physician Assistant

## 2020-12-01 VITALS — BP 132/74 | HR 66 | Ht 66.0 in | Wt 222.4 lb

## 2020-12-01 DIAGNOSIS — F32A Depression, unspecified: Secondary | ICD-10-CM

## 2020-12-01 DIAGNOSIS — Z95810 Presence of automatic (implantable) cardiac defibrillator: Secondary | ICD-10-CM

## 2020-12-01 DIAGNOSIS — I428 Other cardiomyopathies: Secondary | ICD-10-CM

## 2020-12-01 DIAGNOSIS — I5042 Chronic combined systolic (congestive) and diastolic (congestive) heart failure: Secondary | ICD-10-CM

## 2020-12-01 DIAGNOSIS — F419 Anxiety disorder, unspecified: Secondary | ICD-10-CM

## 2020-12-01 DIAGNOSIS — R5383 Other fatigue: Secondary | ICD-10-CM | POA: Diagnosis not present

## 2020-12-01 DIAGNOSIS — I447 Left bundle-branch block, unspecified: Secondary | ICD-10-CM | POA: Diagnosis not present

## 2020-12-01 DIAGNOSIS — R42 Dizziness and giddiness: Secondary | ICD-10-CM

## 2020-12-01 LAB — CBC
Hematocrit: 40.1 % (ref 34.0–46.6)
Hemoglobin: 13.2 g/dL (ref 11.1–15.9)
MCH: 30.1 pg (ref 26.6–33.0)
MCHC: 32.9 g/dL (ref 31.5–35.7)
MCV: 91 fL (ref 79–97)
Platelets: 290 10*3/uL (ref 150–450)
RBC: 4.39 x10E6/uL (ref 3.77–5.28)
RDW: 13.1 % (ref 11.7–15.4)
WBC: 8.5 10*3/uL (ref 3.4–10.8)

## 2020-12-01 LAB — COMPREHENSIVE METABOLIC PANEL
ALT: 35 IU/L — ABNORMAL HIGH (ref 0–32)
AST: 33 IU/L (ref 0–40)
Albumin/Globulin Ratio: 1.6 (ref 1.2–2.2)
Albumin: 4.6 g/dL (ref 3.8–4.8)
Alkaline Phosphatase: 73 IU/L (ref 44–121)
BUN/Creatinine Ratio: 14 (ref 12–28)
BUN: 13 mg/dL (ref 8–27)
Bilirubin Total: 0.5 mg/dL (ref 0.0–1.2)
CO2: 23 mmol/L (ref 20–29)
Calcium: 9.6 mg/dL (ref 8.7–10.3)
Chloride: 101 mmol/L (ref 96–106)
Creatinine, Ser: 0.96 mg/dL (ref 0.57–1.00)
Globulin, Total: 2.8 g/dL (ref 1.5–4.5)
Glucose: 102 mg/dL — ABNORMAL HIGH (ref 65–99)
Potassium: 4.3 mmol/L (ref 3.5–5.2)
Sodium: 139 mmol/L (ref 134–144)
Total Protein: 7.4 g/dL (ref 6.0–8.5)
eGFR: 66 mL/min/{1.73_m2} (ref >59)

## 2020-12-01 LAB — TSH: TSH: 3.15 u[IU]/mL (ref 0.450–4.500)

## 2020-12-01 LAB — LIPID PANEL
Chol/HDL Ratio: 3.7 ratio (ref 0.0–4.4)
Cholesterol, Total: 249 mg/dL — ABNORMAL HIGH (ref 100–199)
HDL: 68 mg/dL (ref >39)
LDL Chol Calc (NIH): 157 mg/dL — ABNORMAL HIGH (ref 0–99)
Triglycerides: 137 mg/dL (ref 0–149)
VLDL Cholesterol Cal: 24 mg/dL (ref 5–40)

## 2020-12-01 NOTE — Patient Instructions (Signed)
Medication Instructions:  Your physician recommends that you continue on your current medications as directed. Please refer to the Current Medication list given to you today.  *If you need a refill on your cardiac medications before your next appointment, please call your pharmacy*   Lab Work: TODAY: CMET, CBC, TSH, FLP  If you have labs (blood work) drawn today and your tests are completely normal, you will receive your results only by: Marland Kitchen MyChart Message (if you have MyChart) OR . A paper copy in the mail If you have any lab test that is abnormal or we need to change your treatment, we will call you to review the results.   Follow-Up: At [facility], you and your health needs are our priority.  As part of our continuing mission to provide you with exceptional heart care, we have created designated Provider Care Teams.  These Care Teams include your primary Cardiologist (physician) and Advanced Practice Providers (APPs -  Physician Assistants and Nurse Practitioners) who all work together to provide you with the care you need, when you need it.  Your next appointment:   6 month(s)  The format for your next appointment:   In Person  Provider:   You may see Gwyndolyn Kaufman, MD or one of the following Advanced Practice Providers on your designated Care Team:

## 2020-12-03 ENCOUNTER — Other Ambulatory Visit: Payer: Self-pay

## 2020-12-03 DIAGNOSIS — E785 Hyperlipidemia, unspecified: Secondary | ICD-10-CM

## 2020-12-21 ENCOUNTER — Encounter: Payer: Self-pay | Admitting: Physician Assistant

## 2020-12-21 DIAGNOSIS — F988 Other specified behavioral and emotional disorders with onset usually occurring in childhood and adolescence: Secondary | ICD-10-CM

## 2020-12-22 MED ORDER — LISDEXAMFETAMINE DIMESYLATE 40 MG PO CAPS: 40.0000 mg | ORAL_CAPSULE | ORAL | 0 refills | Status: DC

## 2021-01-18 ENCOUNTER — Encounter: Payer: Self-pay | Admitting: Registered Nurse

## 2021-01-18 ENCOUNTER — Other Ambulatory Visit: Payer: Self-pay

## 2021-01-18 ENCOUNTER — Ambulatory Visit (INDEPENDENT_AMBULATORY_CARE_PROVIDER_SITE_OTHER): Payer: Medicare Other | Admitting: Registered Nurse

## 2021-01-18 VITALS — BP 132/78 | HR 81 | Temp 98.2°F | Resp 16 | Ht 66.0 in | Wt 222.2 lb

## 2021-01-18 DIAGNOSIS — B079 Viral wart, unspecified: Secondary | ICD-10-CM | POA: Diagnosis not present

## 2021-01-18 DIAGNOSIS — F32A Depression, unspecified: Secondary | ICD-10-CM

## 2021-01-18 DIAGNOSIS — F419 Anxiety disorder, unspecified: Secondary | ICD-10-CM

## 2021-01-18 DIAGNOSIS — Z7689 Persons encountering health services in other specified circumstances: Secondary | ICD-10-CM

## 2021-01-18 DIAGNOSIS — F988 Other specified behavioral and emotional disorders with onset usually occurring in childhood and adolescence: Secondary | ICD-10-CM | POA: Diagnosis not present

## 2021-01-18 MED ORDER — LISDEXAMFETAMINE DIMESYLATE 40 MG PO CAPS: 40.0000 mg | ORAL_CAPSULE | ORAL | 0 refills | Status: DC

## 2021-01-18 MED ORDER — BUPROPION HCL ER (XL) 150 MG PO TB24: ORAL_TABLET | ORAL | 3 refills | Status: DC

## 2021-01-18 NOTE — Progress Notes (Signed)
Established Patient Office Visit  Subjective:  Patient ID: Debra Holloway, female    DOB: 1956-09-07  Age: 65 y.o. MRN: [id number]  CC:  Chief Complaint  Patient presents with  . Establish Care    Pt doing well, needs to ensure her medication will be able to refill later.  Also has a small bump on fourth finger of her left hand that has come and gone multiple times.     HPI Debra Holloway presents for visit to est care  Histories reviewed, updated with patient  Follows with Dr. Meda Coffee for cardiology. Hx of CHF, nonischemic cardiomyopathy, LBBB, AICD placed. Doing well overall no acute changes. Takes lasix prn for dependent edema, otherwise been steady on medications.  Seeing primary care mostly to manage mental health including ADD, depression, and anxiety. Has SAD as well. Notes diet and exercise extremely poor in winter leading to annual increase in cholesterol, but this has already gotten better over past few months. Taking vyvanse 40mg  PO qd with good effect. Taking wellbutrin 150mg  XR PO qd with good effect. No concerns with this regimen at this time  Last labs reviewed from early March. CBC and TSH wnl. CMP and Lipid panel showing some mild abnormalities that cardiology already plans to follow up on.   Bump on finger: Third digit of L hand. Firm, grayish. About 64mm in diameter. Raised about 70mm. Seems to grow, flake off, then recur. Has been around for a few months. No clear cause. Not spreading or worsening. No known cause or preceding event. Has not tried treatment.   Urine Leaking: Reports dime size to half dollar size amount of liquid leaking at times. Unsure if it's related to stress or urge. Is not worsened by taking Lasix. Hx of TVT bladder tack a number of years ago, concerned that this is no longer effective. Can manage while she's at home but when she's out this bothers her. Interested in pharmacologic options for management before referral to Urology.  Past Medical  History:  Diagnosis Date  . ADD (attention deficit disorder)   . AICD (automatic cardioverter/defibrillator) present Aug 19, 2015  . Anxiety    "just when my daddy died a few years ago" (08/19/2015)  . AR (allergic rhinitis)   . Chicken pox   . Chronic combined systolic and diastolic CHF (congestive heart failure) ([place])   . Depression    "just when my daddy died a few years ago" (08-19-2015)  . Dyslipidemia   . Environmental allergies   . H/O vitamin D deficiency   . LBBB (left bundle branch block)    W/1st degree AV Block, Avoid Beta Blockers  . NICM (nonischemic cardiomyopathy) ([place])   . OSA (obstructive sleep apnea) 03/18/2015   Severe OSA with AHI 30/hr; "suppose to wear mask but don't" (08-19-2015)  . Seasonal affective disorder [facility])     Past Surgical History:  Procedure Laterality Date  . CARDIAC CATHETERIZATION    . DILATION AND CURETTAGE OF UTERUS  2003  . EP IMPLANTABLE DEVICE N/A 08/19/15   Procedure: BiV ICD Insertion CRT-D;  Surgeon: Deboraha Sprang, MD;  Location: [place] [facility];  Service: Cardiovascular;  Laterality: N/A;  . LEFT AND RIGHT HEART CATHETERIZATION WITH CORONARY ANGIOGRAM N/A 11/17/2014   Procedure: LEFT AND RIGHT HEART CATHETERIZATION WITH CORONARY ANGIOGRAM;  Surgeon: Troy Sine, MD;  Location: [facility] CATH LAB;  Service: Cardiovascular;  Laterality: N/A;  . TOTAL ABDOMINAL HYSTERECTOMY  2004  . TUBAL LIGATION    .  WISDOM TOOTH EXTRACTION  1986    Family History  Problem Relation Age of Onset  . COPD Mother        Living  . Pancreatic cancer Father 52       Deceased  . Lung cancer Paternal Grandfather   . Heart disease Paternal Grandmother   . Hypertension Paternal Grandmother   . Alzheimer's disease Maternal Grandfather   . Arthritis/Rheumatoid Maternal Grandmother   . Heart disease Maternal Grandmother        Tachycardia  . Cancer Paternal Uncle   . Healthy Brother        x3  . Healthy Daughter        x3  . Breast cancer Sister 60     Social History   Socioeconomic History  . Marital status: Divorced    Spouse name: Not on file  . Number of children: Not on file  . Years of education: Not on file  . Highest education level: Not on file  Occupational History  . Not on file  Tobacco Use  . Smoking status: Never Smoker  . Smokeless tobacco: Never Used  Vaping Use  . Vaping Use: Never used  Substance and Sexual Activity  . Alcohol use: Yes    Alcohol/week: 4.0 standard drinks    Types: 4 Glasses of wine per week  . Drug use: No  . Sexual activity: Not Currently  Other Topics Concern  . Not on file  Social History Narrative  . Not on file   Social Determinants of Health   Financial Resource Strain: Low Risk   . Difficulty of Paying Living Expenses: Not hard at all  Food Insecurity: No Food Insecurity  . Worried About Charity fundraiser in the Last Year: Never true  . Ran Out of Food in the Last Year: Never true  Transportation Needs: No Transportation Needs  . Lack of Transportation (Medical): No  . Lack of Transportation (Non-Medical): No  Physical Activity: Inactive  . Days of Exercise per Week: 0 days  . Minutes of Exercise per Session: 0 min  Stress: No Stress Concern Present  . Feeling of Stress : Not at all  Social Connections: Socially Isolated  . Frequency of Communication with Friends and Family: More than three times a week  . Frequency of Social Gatherings with Friends and Family: Once a week  . Attends Religious Services: Never  . Active Member of Clubs or Organizations: No  . Attends Archivist Meetings: Never  . Marital Status: Divorced  Human resources officer Violence: Not At Risk  . Fear of Current or Ex-Partner: No  . Emotionally Abused: No  . Physically Abused: No  . Sexually Abused: No    Outpatient Medications Prior to Visit  Medication Sig Dispense Refill  . acetaminophen (TYLENOL) 500 MG tablet Take 500 mg by mouth every 6 (six) hours as needed for moderate pain.     Marland Kitchen b complex vitamins tablet Take 1 tablet by mouth daily.    . carvedilol (COREG) 6.25 MG tablet Take 1 tablet (6.25 mg total) by mouth 2 (two) times daily. 180 tablet 3  . Cholecalciferol (VITAMIN D PO) Take 2,000 mg by mouth daily.     . fluticasone (FLONASE) 50 MCG/ACT nasal spray Place 1 spray into both nostrils daily.    . furosemide (LASIX) 40 MG tablet Take 1 tablet (40 mg total) by mouth as needed. 30 tablet 11  . lisinopril (ZESTRIL) 5 MG tablet Take 1 tablet (5  mg total) by mouth daily. 90 tablet 3  . loratadine (CLARITIN) 10 MG tablet     . potassium chloride (KLOR-CON) 10 MEQ tablet Take 10 mEq by mouth as needed (when take furosemide).    Marland Kitchen spironolactone (ALDACTONE) 25 MG tablet Take 0.5 tablets (12.5 mg total) by mouth daily. 45 tablet 3  . buPROPion (WELLBUTRIN XL) 150 MG 24 hr tablet TAKE 1 TABLET(150 MG) BY MOUTH DAILY 90 tablet 3  . lisdexamfetamine (VYVANSE) 40 MG capsule Take 1 capsule (40 mg total) by mouth every morning. 30 capsule 0   No facility-administered medications prior to visit.    Allergies  Allergen Reactions  . Bee Venom Anaphylaxis  . Losartan Other (See Comments)    Pt reports cough and insomnia.  . Erythromycin Itching and Rash  . Lovastatin Other (See Comments)    weakness  . Penicillins Hives, Itching and Rash  . Simvastatin Other (See Comments)    weakness    ROS Review of Systems  Constitutional: Negative.   HENT: Negative.   Eyes: Negative.   Respiratory: Negative.   Cardiovascular: Negative.   Gastrointestinal: Negative.   Endocrine: Negative.   Genitourinary: Negative.   Musculoskeletal: Negative.   Skin: Negative.   Allergic/Immunologic: Negative.   Neurological: Negative.   Hematological: Negative.   Psychiatric/Behavioral: Negative.   All other systems reviewed and are negative.     Objective:    Physical Exam Vitals and nursing note reviewed.  Constitutional:      General: She is not in acute distress.     Appearance: Normal appearance. She is normal weight. She is not ill-appearing, toxic-appearing or diaphoretic.  Cardiovascular:     Rate and Rhythm: Normal rate and regular rhythm.     Heart sounds: Normal heart sounds. No murmur heard. No friction rub. No gallop.   Pulmonary:     Effort: Pulmonary effort is normal. No respiratory distress.     Breath sounds: Normal breath sounds. No stridor. No wheezing, rhonchi or rales.  Chest:     Chest wall: No tenderness.  Skin:    General: Skin is warm and dry.     Findings: Lesion (wart on finger of L hand) present.  Neurological:     General: No focal deficit present.     Mental Status: She is alert and oriented to person, place, and time. Mental status is at baseline.  Psychiatric:        Mood and Affect: Mood normal.        Behavior: Behavior normal.        Thought Content: Thought content normal.        Judgment: Judgment normal.     BP 132/78   Pulse 81   Temp 98.2 F (36.8 C) (Temporal)   Resp 16   Ht 5\' 6"  (1.676 m)   Wt 222 lb 3.2 oz (100.8 kg)   SpO2 97%   BMI 35.86 kg/m  Wt Readings from Last 3 Encounters:  01/18/21 222 lb 3.2 oz (100.8 kg)  12/01/20 222 lb 6.4 oz (100.9 kg)  04/20/20 219 lb 9.6 oz (99.6 kg)     There are no preventive care reminders to display for this patient.  There are no preventive care reminders to display for this patient.  Lab Results  Component Value Date   TSH 3.150 12/01/2020   Lab Results  Component Value Date   WBC 8.5 12/01/2020   HGB 13.2 12/01/2020   HCT 40.1 12/01/2020   MCV 91  12/01/2020   PLT 290 12/01/2020   Lab Results  Component Value Date   NA 139 12/01/2020   K 4.3 12/01/2020   CO2 23 12/01/2020   GLUCOSE 102 (H) 12/01/2020   BUN 13 12/01/2020   CREATININE 0.96 12/01/2020   BILITOT 0.5 12/01/2020   ALKPHOS 73 12/01/2020   AST 33 12/01/2020   ALT 35 (H) 12/01/2020   PROT 7.4 12/01/2020   ALBUMIN 4.6 12/01/2020   CALCIUM 9.6 12/01/2020   ANIONGAP 10  01/17/2018   GFR 69.47 07/30/2018   Lab Results  Component Value Date   CHOL 249 (H) 12/01/2020   Lab Results  Component Value Date   HDL 68 12/01/2020   Lab Results  Component Value Date   LDLCALC 157 (H) 12/01/2020   Lab Results  Component Value Date   TRIG 137 12/01/2020   Lab Results  Component Value Date   CHOLHDL 3.7 12/01/2020   Lab Results  Component Value Date   HGBA1C 5.5 07/30/2018      Assessment & Plan:   Problem List Items Addressed This Visit      Other   ADD (attention deficit disorder)   Relevant Medications   lisdexamfetamine (VYVANSE) 40 MG capsule   Anxiety and depression   Relevant Medications   buPROPion (WELLBUTRIN XL) 150 MG 24 hr tablet      Meds ordered this encounter  Medications  . lisdexamfetamine (VYVANSE) 40 MG capsule    Sig: Take 1 capsule (40 mg total) by mouth every morning.    Dispense:  90 capsule    Refill:  0    Order Specific Question:   Supervising Provider    Answer:   Carlota Raspberry, JEFFREY R [[id number]]  . buPROPion (WELLBUTRIN XL) 150 MG 24 hr tablet    Sig: TAKE 1 TABLET(150 MG) BY MOUTH DAILY    Dispense:  90 tablet    Refill:  3    Order Specific Question:   Supervising Provider    Answer:   Carlota Raspberry, JEFFREY R [[id number]]    Follow-up: No follow-ups on file.   PLAN  Refill wellbutrin and vyvanse  Appears as wart on finger. Will likely respond well to OTCs. Discussed options.  Return in 3 mo for med check on Vyvanse.   Will double check with Dr. Meda Coffee on starting medication for incontinence. Considering Myrbetriq or Duloxetine but each carries risk of increasing BP.   Patient encouraged to call clinic with any questions, comments, or concerns.  Maximiano Coss, NP

## 2021-01-18 NOTE — Patient Instructions (Signed)
Ms Anwen Cannedy to meet you today. I'm looking forward to working with you  I've ensured there are refills on file for your Vyvanse (3 mo) and Wellbutrin (1y). Your Medicare Annual Visit is due in July - you can have that with our LPN here at the office. At that time I can refill your Vyvanse too.  Last set of labs was done by cardiology on 12/01/20. Results overall were okay, they did see elevated lipids which they're rechecking and a mild abnormality in glucose and ALT, which I expect to be normal at next check.  I am CCing Dr. Meda Coffee on today's visit to ask her about two agents that come to mind for incontinence - these are mirabegron and duloxetine - each has pros and cons and each has some potential for cardiac side effects. I'll let you know what she says.   Thanks for coming in today,  Debra Holloway

## 2021-01-21 ENCOUNTER — Encounter: Payer: Self-pay | Admitting: Registered Nurse

## 2021-01-21 NOTE — Telephone Encounter (Signed)
I'm checking back to see if you ever got an answer from Dr Francesca Oman office about okaying something for my urine leakage.

## 2021-01-25 ENCOUNTER — Ambulatory Visit (INDEPENDENT_AMBULATORY_CARE_PROVIDER_SITE_OTHER): Payer: Medicare Other

## 2021-01-25 DIAGNOSIS — I428 Other cardiomyopathies: Secondary | ICD-10-CM

## 2021-01-26 LAB — CUP PACEART REMOTE DEVICE CHECK
Battery Remaining Longevity: 20 mo
Battery Voltage: 2.92 V
Brady Statistic AP VP Percent: 0.22 %
Brady Statistic AP VS Percent: 0.02 %
Brady Statistic AS VP Percent: 98.5 %
Brady Statistic AS VS Percent: 1.26 %
Brady Statistic RA Percent Paced: 0.24 %
Brady Statistic RV Percent Paced: 98.37 %
Date Time Interrogation Session: 20220426131928
HighPow Impedance: 86 Ohm
Implantable Lead Implant Date: 20161114
Implantable Lead Implant Date: 20161114
Implantable Lead Implant Date: 20161114
Implantable Lead Location: 753858
Implantable Lead Location: 753859
Implantable Lead Location: 753860
Implantable Lead Model: 4598
Implantable Lead Model: 5076
Implantable Pulse Generator Implant Date: 20161114
Lead Channel Impedance Value: 1007 Ohm
Lead Channel Impedance Value: 1026 Ohm
Lead Channel Impedance Value: 1083 Ohm
Lead Channel Impedance Value: 342 Ohm
Lead Channel Impedance Value: 399 Ohm
Lead Channel Impedance Value: 399 Ohm
Lead Channel Impedance Value: 456 Ohm
Lead Channel Impedance Value: 475 Ohm
Lead Channel Impedance Value: 513 Ohm
Lead Channel Impedance Value: 665 Ohm
Lead Channel Impedance Value: 665 Ohm
Lead Channel Impedance Value: 760 Ohm
Lead Channel Impedance Value: 836 Ohm
Lead Channel Pacing Threshold Amplitude: 0.625 V
Lead Channel Pacing Threshold Amplitude: 0.625 V
Lead Channel Pacing Threshold Amplitude: 1.125 V
Lead Channel Pacing Threshold Pulse Width: 0.4 ms
Lead Channel Pacing Threshold Pulse Width: 0.4 ms
Lead Channel Pacing Threshold Pulse Width: 0.4 ms
Lead Channel Sensing Intrinsic Amplitude: 2.25 mV
Lead Channel Sensing Intrinsic Amplitude: 2.25 mV
Lead Channel Sensing Intrinsic Amplitude: 26 mV
Lead Channel Sensing Intrinsic Amplitude: 26 mV
Lead Channel Setting Pacing Amplitude: 2 V
Lead Channel Setting Pacing Amplitude: 2.5 V
Lead Channel Setting Pacing Amplitude: 2.75 V
Lead Channel Setting Pacing Pulse Width: 0.4 ms
Lead Channel Setting Pacing Pulse Width: 0.4 ms
Lead Channel Setting Sensing Sensitivity: 0.45 mV

## 2021-02-15 ENCOUNTER — Telehealth: Payer: Self-pay | Admitting: Registered Nurse

## 2021-02-15 ENCOUNTER — Encounter: Payer: Self-pay | Admitting: Registered Nurse

## 2021-02-15 DIAGNOSIS — F988 Other specified behavioral and emotional disorders with onset usually occurring in childhood and adolescence: Secondary | ICD-10-CM

## 2021-02-15 MED ORDER — LISDEXAMFETAMINE DIMESYLATE 40 MG PO CAPS: 40.0000 mg | ORAL_CAPSULE | ORAL | 0 refills | Status: DC

## 2021-02-15 NOTE — Telephone Encounter (Signed)
Called the pharmacy to verify and spoke with [facility]. Last time Vyvanse was picked up was 12/24/20. She reports patient has rx on file with them but for the 90 day supply after insurance, it is 300 dollars for patient. Please advise as her pcp is out of the office.

## 2021-02-15 NOTE — Telephone Encounter (Signed)
Will send prescription for pt while PCP is out of office.

## 2021-02-15 NOTE — Telephone Encounter (Signed)
Called patient to inform

## 2021-02-15 NOTE — Telephone Encounter (Signed)
Medication sent to pharmacy 01/18/21 #90 with no refills. Please advise.

## 2021-02-15 NOTE — Telephone Encounter (Signed)
Pt called in asking for a refill on the Vyvanse, she states that she hasn't picked up anything from the pharmacy and that only 30 days of this medication can be sent in at a time. Please advise pt is out of this medication.

## 2021-02-16 NOTE — Progress Notes (Signed)
Remote ICD transmission.   

## 2021-03-07 ENCOUNTER — Other Ambulatory Visit: Payer: Medicare Other

## 2021-03-20 ENCOUNTER — Encounter: Payer: Self-pay | Admitting: Registered Nurse

## 2021-03-20 ENCOUNTER — Other Ambulatory Visit: Payer: Self-pay | Admitting: Family Medicine

## 2021-03-20 DIAGNOSIS — F988 Other specified behavioral and emotional disorders with onset usually occurring in childhood and adolescence: Secondary | ICD-10-CM

## 2021-03-21 ENCOUNTER — Other Ambulatory Visit: Payer: Self-pay

## 2021-03-21 DIAGNOSIS — F988 Other specified behavioral and emotional disorders with onset usually occurring in childhood and adolescence: Secondary | ICD-10-CM

## 2021-03-21 MED ORDER — LISDEXAMFETAMINE DIMESYLATE 40 MG PO CAPS: 40.0000 mg | ORAL_CAPSULE | ORAL | 0 refills | Status: DC

## 2021-03-21 NOTE — Telephone Encounter (Signed)
Duplicate. Request has been sent. Unable to refuse.

## 2021-03-21 NOTE — Telephone Encounter (Signed)
Good morning. I would like a refill on my Vyvanse for me, please. Thank you.  LFD 02/15/21 #30 with no refills LOV 01/18/21 NOV 04/26/21

## 2021-04-06 ENCOUNTER — Telehealth: Payer: Self-pay | Admitting: Registered Nurse

## 2021-04-06 ENCOUNTER — Other Ambulatory Visit: Payer: Self-pay

## 2021-04-06 ENCOUNTER — Other Ambulatory Visit: Payer: Medicare Other

## 2021-04-06 DIAGNOSIS — E785 Hyperlipidemia, unspecified: Secondary | ICD-10-CM | POA: Diagnosis not present

## 2021-04-06 LAB — LIPID PANEL
Chol/HDL Ratio: 3.7 ratio (ref 0.0–4.4)
Cholesterol, Total: 238 mg/dL — ABNORMAL HIGH (ref 100–199)
HDL: 65 mg/dL (ref >39)
LDL Chol Calc (NIH): 150 mg/dL — ABNORMAL HIGH (ref 0–99)
Triglycerides: 132 mg/dL (ref 0–149)
VLDL Cholesterol Cal: 23 mg/dL (ref 5–40)

## 2021-04-06 NOTE — Telephone Encounter (Signed)
Left message for patient to schedule Annual Wellness Visit.  Please schedule with Nurse Health Advisor Julie Greer, RN at [place]   

## 2021-04-25 ENCOUNTER — Encounter: Payer: Self-pay | Admitting: Registered Nurse

## 2021-04-26 ENCOUNTER — Encounter: Payer: Self-pay | Admitting: Registered Nurse

## 2021-04-26 ENCOUNTER — Encounter: Payer: Medicare Other | Admitting: Registered Nurse

## 2021-04-27 ENCOUNTER — Other Ambulatory Visit: Payer: Self-pay

## 2021-04-27 DIAGNOSIS — F988 Other specified behavioral and emotional disorders with onset usually occurring in childhood and adolescence: Secondary | ICD-10-CM

## 2021-04-27 MED ORDER — LISDEXAMFETAMINE DIMESYLATE 40 MG PO CAPS: 40.0000 mg | ORAL_CAPSULE | ORAL | 0 refills | Status: DC

## 2021-04-27 NOTE — Telephone Encounter (Signed)
I would like a refill on my Vyvanse for me, please. Thank you   LFD 03/21/21 #30 with no refills LOV 01/18/21 NOV none

## 2021-05-02 ENCOUNTER — Ambulatory Visit (INDEPENDENT_AMBULATORY_CARE_PROVIDER_SITE_OTHER): Payer: Medicare Other

## 2021-05-02 DIAGNOSIS — I428 Other cardiomyopathies: Secondary | ICD-10-CM | POA: Diagnosis not present

## 2021-05-03 LAB — CUP PACEART REMOTE DEVICE CHECK
Battery Remaining Longevity: 18 mo
Battery Voltage: 2.9 V
Brady Statistic AP VP Percent: 0.22 %
Brady Statistic AP VS Percent: 0.02 %
Brady Statistic AS VP Percent: 98.31 %
Brady Statistic AS VS Percent: 1.45 %
Brady Statistic RA Percent Paced: 0.24 %
Brady Statistic RV Percent Paced: 98.29 %
Date Time Interrogation Session: 20220801043726
HighPow Impedance: 78 Ohm
Implantable Lead Implant Date: 20161114
Implantable Lead Implant Date: 20161114
Implantable Lead Implant Date: 20161114
Implantable Lead Location: 753858
Implantable Lead Location: 753859
Implantable Lead Location: 753860
Implantable Lead Model: 4598
Implantable Lead Model: 5076
Implantable Pulse Generator Implant Date: 20161114
Lead Channel Impedance Value: 342 Ohm
Lead Channel Impedance Value: 399 Ohm
Lead Channel Impedance Value: 418 Ohm
Lead Channel Impedance Value: 456 Ohm
Lead Channel Impedance Value: 475 Ohm
Lead Channel Impedance Value: 475 Ohm
Lead Channel Impedance Value: 608 Ohm
Lead Channel Impedance Value: 665 Ohm
Lead Channel Impedance Value: 703 Ohm
Lead Channel Impedance Value: 722 Ohm
Lead Channel Impedance Value: 836 Ohm
Lead Channel Impedance Value: 950 Ohm
Lead Channel Impedance Value: 988 Ohm
Lead Channel Pacing Threshold Amplitude: 0.625 V
Lead Channel Pacing Threshold Amplitude: 0.625 V
Lead Channel Pacing Threshold Amplitude: 1.125 V
Lead Channel Pacing Threshold Pulse Width: 0.4 ms
Lead Channel Pacing Threshold Pulse Width: 0.4 ms
Lead Channel Pacing Threshold Pulse Width: 0.4 ms
Lead Channel Sensing Intrinsic Amplitude: 2.75 mV
Lead Channel Sensing Intrinsic Amplitude: 2.75 mV
Lead Channel Sensing Intrinsic Amplitude: 22.25 mV
Lead Channel Sensing Intrinsic Amplitude: 22.25 mV
Lead Channel Setting Pacing Amplitude: 2 V
Lead Channel Setting Pacing Amplitude: 2.5 V
Lead Channel Setting Pacing Amplitude: 2.75 V
Lead Channel Setting Pacing Pulse Width: 0.4 ms
Lead Channel Setting Pacing Pulse Width: 0.4 ms
Lead Channel Setting Sensing Sensitivity: 0.45 mV

## 2021-05-25 NOTE — Progress Notes (Signed)
Remote ICD transmission.   

## 2021-05-27 ENCOUNTER — Encounter: Payer: Self-pay | Admitting: Registered Nurse

## 2021-05-27 ENCOUNTER — Other Ambulatory Visit: Payer: Self-pay

## 2021-05-27 DIAGNOSIS — F988 Other specified behavioral and emotional disorders with onset usually occurring in childhood and adolescence: Secondary | ICD-10-CM

## 2021-05-27 MED ORDER — LISDEXAMFETAMINE DIMESYLATE 40 MG PO CAPS: 40.0000 mg | ORAL_CAPSULE | ORAL | 0 refills | Status: DC

## 2021-05-27 NOTE — Telephone Encounter (Signed)
Good afternoon. I would like to request my Vyvanse for September, please. Thank you very much and I hope you all have a great weekend  LFD 04/27/21#30 with no refills LOV 01/18/21 NOV 07/06/21

## 2021-05-27 NOTE — Telephone Encounter (Signed)
Controlled substance database reviewed.  Last Vyvanse prescription 40 mg #30 filled on 04/27/2021.  Refill ordered.

## 2021-07-05 ENCOUNTER — Encounter: Payer: Medicare Other | Admitting: Registered Nurse

## 2021-07-06 ENCOUNTER — Other Ambulatory Visit: Payer: Self-pay

## 2021-07-06 ENCOUNTER — Ambulatory Visit (INDEPENDENT_AMBULATORY_CARE_PROVIDER_SITE_OTHER): Payer: Medicare Other | Admitting: Registered Nurse

## 2021-07-06 ENCOUNTER — Encounter: Payer: Self-pay | Admitting: Registered Nurse

## 2021-07-06 VITALS — BP 129/63 | HR 70 | Temp 97.9°F | Resp 17 | Ht 66.0 in | Wt 224.8 lb

## 2021-07-06 DIAGNOSIS — E559 Vitamin D deficiency, unspecified: Secondary | ICD-10-CM | POA: Diagnosis not present

## 2021-07-06 DIAGNOSIS — I480 Paroxysmal atrial fibrillation: Secondary | ICD-10-CM

## 2021-07-06 DIAGNOSIS — F40232 Fear of other medical care: Secondary | ICD-10-CM

## 2021-07-06 DIAGNOSIS — F988 Other specified behavioral and emotional disorders with onset usually occurring in childhood and adolescence: Secondary | ICD-10-CM | POA: Diagnosis not present

## 2021-07-06 DIAGNOSIS — N393 Stress incontinence (female) (male): Secondary | ICD-10-CM

## 2021-07-06 DIAGNOSIS — E785 Hyperlipidemia, unspecified: Secondary | ICD-10-CM | POA: Diagnosis not present

## 2021-07-06 LAB — LIPID PANEL
Cholesterol: 253 mg/dL — ABNORMAL HIGH (ref 0–200)
HDL: 71 mg/dL (ref >39.00)
LDL Cholesterol: 155 mg/dL — ABNORMAL HIGH (ref 0–99)
NonHDL: 181.52
Total CHOL/HDL Ratio: 4
Triglycerides: 134 mg/dL (ref 0.0–149.0)
VLDL: 26.8 mg/dL (ref 0.0–40.0)

## 2021-07-06 LAB — CBC WITH DIFFERENTIAL/PLATELET
Basophils Absolute: 0.1 10*3/uL (ref 0.0–0.1)
Basophils Relative: 0.8 % (ref 0.0–3.0)
Eosinophils Absolute: 0.1 10*3/uL (ref 0.0–0.7)
Eosinophils Relative: 1.3 % (ref 0.0–5.0)
HCT: 40.7 % (ref 36.0–46.0)
Hemoglobin: 13.5 g/dL (ref 12.0–15.0)
Lymphocytes Relative: 29.9 % (ref 12.0–46.0)
Lymphs Abs: 2.2 10*3/uL (ref 0.7–4.0)
MCHC: 33.1 g/dL (ref 30.0–36.0)
MCV: 91.6 fl (ref 78.0–100.0)
Monocytes Absolute: 0.6 10*3/uL (ref 0.1–1.0)
Monocytes Relative: 8.3 % (ref 3.0–12.0)
Neutro Abs: 4.3 10*3/uL (ref 1.4–7.7)
Neutrophils Relative %: 59.7 % (ref 43.0–77.0)
Platelets: 268 10*3/uL (ref 150.0–400.0)
RBC: 4.45 Mil/uL (ref 3.87–5.11)
RDW: 14.2 % (ref 11.5–15.5)
WBC: 7.2 10*3/uL (ref 4.0–10.5)

## 2021-07-06 LAB — COMPREHENSIVE METABOLIC PANEL
ALT: 19 U/L (ref 0–35)
AST: 20 U/L (ref 0–37)
Albumin: 4.4 g/dL (ref 3.5–5.2)
Alkaline Phosphatase: 66 U/L (ref 39–117)
BUN: 16 mg/dL (ref 6–23)
CO2: 27 mEq/L (ref 19–32)
Calcium: 9.6 mg/dL (ref 8.4–10.5)
Chloride: 103 mEq/L (ref 96–112)
Creatinine, Ser: 0.88 mg/dL (ref 0.40–1.20)
GFR: 69.77 mL/min (ref >60.00)
Glucose, Bld: 88 mg/dL (ref 70–99)
Potassium: 4.4 mEq/L (ref 3.5–5.1)
Sodium: 137 mEq/L (ref 135–145)
Total Bilirubin: 0.5 mg/dL (ref 0.2–1.2)
Total Protein: 7.3 g/dL (ref 6.0–8.3)

## 2021-07-06 LAB — HEMOGLOBIN A1C: Hgb A1c MFr Bld: 5.6 % (ref 4.6–6.5)

## 2021-07-06 LAB — VITAMIN D 25 HYDROXY (VIT D DEFICIENCY, FRACTURES): VITD: 24.47 ng/mL — ABNORMAL LOW (ref 30.00–100.00)

## 2021-07-06 LAB — TSH: TSH: 2.82 u[IU]/mL (ref 0.35–5.50)

## 2021-07-06 MED ORDER — LISDEXAMFETAMINE DIMESYLATE 50 MG PO CAPS: 50.0000 mg | ORAL_CAPSULE | Freq: Every day | ORAL | 0 refills | Status: DC

## 2021-07-06 MED ORDER — CLONAZEPAM 0.25 MG PO TBDP: 0.2500 mg | ORAL_TABLET | Freq: Every day | ORAL | 0 refills | Status: DC | PRN

## 2021-07-06 MED ORDER — MIRABEGRON ER 25 MG PO TB24: 25.0000 mg | ORAL_TABLET | Freq: Every day | ORAL | 3 refills | Status: DC

## 2021-07-06 NOTE — Patient Instructions (Addendum)
Ms. Debra Holloway to see you. No concerns today  Lab results will be back today or tomorrow. I'll call with any urgent concerns.  Refilled meds x 6 mo - see you then for med check on Vyvanse. Sooner if you need anything  Physical in a year  Recommend [facility] on [address] - [phone]  Recommend [facility] on [address]  Thank you  Denice Paradise     If you have lab work done today you will be contacted with your lab results within the next 2 weeks.  If you have not heard from [place] then please contact us. The fastest way to get your results is to register for My Chart.   IF you received an x-ray today, you will receive an invoice from [facility]. Please contact [facility] at [phone] with questions or concerns regarding your invoice.   IF you received labwork today, you will receive an invoice from [facility]. Please contact [facility] at [phone] with questions or concerns regarding your invoice.   Our billing staff will not be able to assist you with questions regarding bills from these companies.  You will be contacted with the lab results as soon as they are available. The fastest way to get your results is to activate your My Chart account. Instructions are located on the last page of this paperwork. If you have not heard from [place] regarding the results in 2 weeks, please contact this office.

## 2021-07-06 NOTE — Progress Notes (Signed)
Established Patient Office Visit  Subjective:  Patient ID: Debra Holloway, female    DOB: Dec 10, 1955  Age: 65 y.o. MRN: [id number]  CC:  Chief Complaint  Patient presents with   Annual Exam    Patient states she is here for a CPE.    HPI Debra Holloway presents for CPE   No acute concerns  Histories reviewed and updated with patient.   Continues to follow with Dr. Caryl Comes in cardiology. Stable.   Ongoing stress incontinence. Interested in therapy. No new symptoms. No red flags. No back injury.   Notes exercising well - interested in continuing aqua therapy through winter.   Notes that she is overdue for dentist visit. Panic attacks at dentist in past - trauma when she was a child.   Past Medical History:  Diagnosis Date   ADD (attention deficit disorder)    AICD (automatic cardioverter/defibrillator) present Sep 09, 2015   Anxiety    "just when my daddy died a few years ago" (09-09-2015)   AR (allergic rhinitis)    Chicken pox    Chronic combined systolic and diastolic CHF (congestive heart failure) ([place])    Depression    "just when my daddy died a few years ago" (2015-09-09)   Dyslipidemia    Environmental allergies    H/O vitamin D deficiency    LBBB (left bundle branch block)    W/1st degree AV Block, Avoid Beta Blockers   NICM (nonischemic cardiomyopathy) (HCC)    OSA (obstructive sleep apnea) 03/18/2015   Severe OSA with AHI 30/hr; "suppose to wear mask but don't" (09/09/2015)   Seasonal affective disorder ([place])     Past Surgical History:  Procedure Laterality Date   CARDIAC CATHETERIZATION     DILATION AND CURETTAGE OF UTERUS  2003   EP IMPLANTABLE DEVICE N/A 09-09-2015   Procedure: BiV ICD Insertion CRT-D;  Surgeon: Deboraha Sprang, MD;  Location: [facility];  Service: Cardiovascular;  Laterality: N/A;   LEFT AND RIGHT HEART CATHETERIZATION WITH CORONARY ANGIOGRAM N/A 11/17/2014   Procedure: LEFT AND RIGHT HEART CATHETERIZATION WITH CORONARY  ANGIOGRAM;  Surgeon: Troy Sine, MD;  Location: [facility];  Service: Cardiovascular;  Laterality: N/A;   TOTAL ABDOMINAL HYSTERECTOMY  2004   TUBAL LIGATION     WISDOM TOOTH EXTRACTION  1986    Family History  Problem Relation Age of Onset   COPD Mother        Living   Pancreatic cancer Father 56       Deceased   Lung cancer Paternal Grandfather    Heart disease Paternal Grandmother    Hypertension Paternal Grandmother    Alzheimer's disease Maternal Grandfather    Arthritis/Rheumatoid Maternal Grandmother    Heart disease Maternal Grandmother        Tachycardia   Cancer Paternal Uncle    Healthy Brother        x3   Healthy Daughter        x3   Breast cancer Sister 19    Social History   Socioeconomic History   Marital status: Divorced    Spouse name: Not on file   Number of children: Not on file   Years of education: Not on file   Highest education level: Not on file  Occupational History   Not on file  Tobacco Use   Smoking status: Never   Smokeless tobacco: Never  Vaping Use   Vaping Use: Never used  Substance and Sexual Activity  Alcohol use: Yes    Alcohol/week: 4.0 standard drinks    Types: 4 Glasses of wine per week   Drug use: No   Sexual activity: Not Currently  Other Topics Concern   Not on file  Social History Narrative   Not on file   Social Determinants of Health   Financial Resource Strain: Not on file  Food Insecurity: Not on file  Transportation Needs: Not on file  Physical Activity: Not on file  Stress: Not on file  Social Connections: Not on file  Intimate Partner Violence: Not on file    Outpatient Medications Prior to Visit  Medication Sig Dispense Refill   b complex vitamins tablet Take 1 tablet by mouth daily.     buPROPion (WELLBUTRIN XL) 150 MG 24 hr tablet TAKE 1 TABLET(150 MG) BY MOUTH DAILY 90 tablet 3   carvedilol (COREG) 6.25 MG tablet Take 1 tablet (6.25 mg total) by mouth 2 (two) times daily. 180 tablet 3    Cholecalciferol (VITAMIN D PO) Take 2,000 mg by mouth daily.      fluticasone (FLONASE) 50 MCG/ACT nasal spray Place 1 spray into both nostrils daily.     furosemide (LASIX) 40 MG tablet Take 1 tablet (40 mg total) by mouth as needed. 30 tablet 11   lisinopril (ZESTRIL) 5 MG tablet Take 1 tablet (5 mg total) by mouth daily. 90 tablet 3   loratadine (CLARITIN) 10 MG tablet      potassium chloride (KLOR-CON) 10 MEQ tablet Take 10 mEq by mouth as needed (when take furosemide).     spironolactone (ALDACTONE) 25 MG tablet Take 0.5 tablets (12.5 mg total) by mouth daily. 45 tablet 3   lisdexamfetamine (VYVANSE) 40 MG capsule Take 1 capsule (40 mg total) by mouth every morning. 30 capsule 0   acetaminophen (TYLENOL) 500 MG tablet Take 500 mg by mouth every 6 (six) hours as needed for moderate pain.     No facility-administered medications prior to visit.    Allergies  Allergen Reactions   Bee Venom Anaphylaxis   Losartan Other (See Comments)    Pt reports cough and insomnia.   Erythromycin Itching and Rash   Lovastatin Other (See Comments)    weakness   Penicillins Hives, Itching and Rash   Simvastatin Other (See Comments)    weakness    ROS Review of Systems  Constitutional: Negative.   HENT: Negative.    Eyes: Negative.   Respiratory: Negative.    Cardiovascular: Negative.   Gastrointestinal: Negative.   Genitourinary: Negative.   Musculoskeletal: Negative.   Skin: Negative.   Neurological: Negative.   Psychiatric/Behavioral: Negative.    All other systems reviewed and are negative.    Objective:    Physical Exam Vitals and nursing note reviewed.  Constitutional:      General: She is not in acute distress.    Appearance: Normal appearance. She is normal weight. She is not ill-appearing, toxic-appearing or diaphoretic.  HENT:     Head: Normocephalic and atraumatic.     Right Ear: Tympanic membrane, ear canal and external ear normal. There is no impacted cerumen.     Left  Ear: Tympanic membrane, ear canal and external ear normal. There is no impacted cerumen.     Nose: Nose normal. No congestion or rhinorrhea.     Mouth/Throat:     Mouth: Mucous membranes are moist.     Pharynx: Oropharynx is clear. No oropharyngeal exudate or posterior oropharyngeal erythema.  Eyes:  General: No scleral icterus.       Right eye: No discharge.        Left eye: No discharge.     Extraocular Movements: Extraocular movements intact.     Conjunctiva/sclera: Conjunctivae normal.     Pupils: Pupils are equal, round, and reactive to light.  Cardiovascular:     Rate and Rhythm: Normal rate and regular rhythm.     Pulses: Normal pulses.     Heart sounds: Normal heart sounds. No murmur heard.   No friction rub. No gallop.  Pulmonary:     Effort: Pulmonary effort is normal. No respiratory distress.     Breath sounds: Normal breath sounds. No stridor. No wheezing, rhonchi or rales.  Chest:     Chest wall: No tenderness.  Abdominal:     General: Abdomen is flat. Bowel sounds are normal. There is no distension.     Palpations: Abdomen is soft. There is no mass.     Tenderness: There is no abdominal tenderness. There is no right CVA tenderness, left CVA tenderness, guarding or rebound.     Hernia: No hernia is present.  Musculoskeletal:        General: No swelling, tenderness, deformity or signs of injury. Normal range of motion.     Right lower leg: No edema.     Left lower leg: No edema.  Skin:    General: Skin is warm and dry.     Capillary Refill: Capillary refill takes less than 2 seconds.     Coloration: Skin is not jaundiced or pale.     Findings: No bruising, erythema, lesion or rash.  Neurological:     General: No focal deficit present.     Mental Status: She is alert and oriented to person, place, and time. Mental status is at baseline.     Cranial Nerves: No cranial nerve deficit.     Sensory: No sensory deficit.     Motor: No weakness.     Coordination:  Coordination normal.     Gait: Gait normal.     Deep Tendon Reflexes: Reflexes normal.  Psychiatric:        Mood and Affect: Mood normal.        Behavior: Behavior normal.        Thought Content: Thought content normal.        Judgment: Judgment normal.    BP 129/63   Pulse 70   Temp 97.9 F (36.6 C) (Temporal)   Resp 17   Ht $R'5\' 6"'[place]$  (1.676 m)   Wt 224 lb 12.8 oz (102 kg)   BMI 36.28 kg/m  Wt Readings from Last 3 Encounters:  07/06/21 224 lb 12.8 oz (102 kg)  01/18/21 222 lb 3.2 oz (100.8 kg)  12/01/20 222 lb 6.4 oz (100.9 kg)     There are no preventive care reminders to display for this patient.  There are no preventive care reminders to display for this patient.  Lab Results  Component Value Date   TSH 3.150 12/01/2020   Lab Results  Component Value Date   WBC 8.5 12/01/2020   HGB 13.2 12/01/2020   HCT 40.1 12/01/2020   MCV 91 12/01/2020   PLT 290 12/01/2020   Lab Results  Component Value Date   NA 139 12/01/2020   K 4.3 12/01/2020   CO2 23 12/01/2020   GLUCOSE 102 (H) 12/01/2020   BUN 13 12/01/2020   CREATININE 0.96 12/01/2020   BILITOT 0.5 12/01/2020   ALKPHOS  73 12/01/2020   AST 33 12/01/2020   ALT 35 (H) 12/01/2020   PROT 7.4 12/01/2020   ALBUMIN 4.6 12/01/2020   CALCIUM 9.6 12/01/2020   ANIONGAP 10 01/17/2018   EGFR 66 12/01/2020   GFR 69.47 07/30/2018   Lab Results  Component Value Date   CHOL 238 (H) 04/06/2021   Lab Results  Component Value Date   HDL 65 04/06/2021   Lab Results  Component Value Date   LDLCALC 150 (H) 04/06/2021   Lab Results  Component Value Date   TRIG 132 04/06/2021   Lab Results  Component Value Date   CHOLHDL 3.7 04/06/2021   Lab Results  Component Value Date   HGBA1C 5.5 07/30/2018      Assessment & Plan:   Problem List Items Addressed This Visit       Cardiovascular and Mediastinum   PAF (paroxysmal atrial fibrillation) ([place]) - Primary   Relevant Orders   CBC with Differential/Platelet    Comprehensive metabolic panel   TSH     Other   ADD (attention deficit disorder)   Relevant Medications   lisdexamfetamine (VYVANSE) 50 MG capsule   Other Relevant Orders   CBC with Differential/Platelet   Comprehensive metabolic panel   TSH   Dyslipidemia   Relevant Orders   Hemoglobin A1c   Lipid panel   Other Visit Diagnoses     Vitamin D deficiency       Relevant Orders   Vitamin D (25 hydroxy)   Stress incontinence of urine       Relevant Medications   mirabegron ER (MYRBETRIQ) 25 MG TB24 tablet       Meds ordered this encounter  Medications   mirabegron ER (MYRBETRIQ) 25 MG TB24 tablet    Sig: Take 1 tablet (25 mg total) by mouth daily.    Dispense:  30 tablet    Refill:  3    Order Specific Question:   Supervising Provider    Answer:   Carlota Raspberry, JEFFREY R [[id number]]   lisdexamfetamine (VYVANSE) 50 MG capsule    Sig: Take 1 capsule (50 mg total) by mouth daily.    Dispense:  90 capsule    Refill:  0    Order Specific Question:   Supervising Provider    Answer:   Carlota Raspberry, JEFFREY R [[id number]]    Follow-up: Return in about 6 months (around 01/04/2022) for Vyvnase refill.   PLAN Exam unremarkable Labs collected. Will follow up with the patient as warranted. Start myrbetriq for stress incontinence Recommend local dentist, continue exercise at [facility] Patient encouraged to call clinic with any questions, comments, or concerns.  Maximiano Coss, NP

## 2021-07-07 ENCOUNTER — Encounter: Payer: Self-pay | Admitting: Registered Nurse

## 2021-07-09 ENCOUNTER — Other Ambulatory Visit: Payer: Self-pay | Admitting: Registered Nurse

## 2021-07-09 DIAGNOSIS — E785 Hyperlipidemia, unspecified: Secondary | ICD-10-CM

## 2021-07-09 MED ORDER — EZETIMIBE 10 MG PO TABS: 10.0000 mg | ORAL_TABLET | Freq: Every day | ORAL | 3 refills | Status: DC

## 2021-07-12 ENCOUNTER — Other Ambulatory Visit: Payer: Self-pay | Admitting: Registered Nurse

## 2021-07-12 DIAGNOSIS — F988 Other specified behavioral and emotional disorders with onset usually occurring in childhood and adolescence: Secondary | ICD-10-CM

## 2021-07-12 MED ORDER — LISDEXAMFETAMINE DIMESYLATE 50 MG PO CAPS: 50.0000 mg | ORAL_CAPSULE | Freq: Every day | ORAL | 0 refills | Status: DC

## 2021-08-01 ENCOUNTER — Ambulatory Visit (INDEPENDENT_AMBULATORY_CARE_PROVIDER_SITE_OTHER): Payer: Medicare Other

## 2021-08-01 DIAGNOSIS — I428 Other cardiomyopathies: Secondary | ICD-10-CM | POA: Diagnosis not present

## 2021-08-02 LAB — CUP PACEART REMOTE DEVICE CHECK
Battery Remaining Longevity: 17 mo
Battery Voltage: 2.89 V
Brady Statistic AP VP Percent: 0.21 %
Brady Statistic AP VS Percent: 0.02 %
Brady Statistic AS VP Percent: 98.1 %
Brady Statistic AS VS Percent: 1.67 %
Brady Statistic RA Percent Paced: 0.23 %
Brady Statistic RV Percent Paced: 98.06 %
Date Time Interrogation Session: 20221031063327
HighPow Impedance: 79 Ohm
Implantable Lead Implant Date: 20161114
Implantable Lead Implant Date: 20161114
Implantable Lead Implant Date: 20161114
Implantable Lead Location: 753858
Implantable Lead Location: 753859
Implantable Lead Location: 753860
Implantable Lead Model: 4598
Implantable Lead Model: 5076
Implantable Pulse Generator Implant Date: 20161114
Lead Channel Impedance Value: 1007 Ohm
Lead Channel Impedance Value: 1026 Ohm
Lead Channel Impedance Value: 361 Ohm
Lead Channel Impedance Value: 418 Ohm
Lead Channel Impedance Value: 418 Ohm
Lead Channel Impedance Value: 456 Ohm
Lead Channel Impedance Value: 475 Ohm
Lead Channel Impedance Value: 513 Ohm
Lead Channel Impedance Value: 646 Ohm
Lead Channel Impedance Value: 665 Ohm
Lead Channel Impedance Value: 836 Ohm
Lead Channel Impedance Value: 836 Ohm
Lead Channel Impedance Value: 950 Ohm
Lead Channel Pacing Threshold Amplitude: 0.625 V
Lead Channel Pacing Threshold Amplitude: 0.625 V
Lead Channel Pacing Threshold Amplitude: 1.375 V
Lead Channel Pacing Threshold Pulse Width: 0.4 ms
Lead Channel Pacing Threshold Pulse Width: 0.4 ms
Lead Channel Pacing Threshold Pulse Width: 0.4 ms
Lead Channel Sensing Intrinsic Amplitude: 2.625 mV
Lead Channel Sensing Intrinsic Amplitude: 2.625 mV
Lead Channel Sensing Intrinsic Amplitude: 24.25 mV
Lead Channel Sensing Intrinsic Amplitude: 24.25 mV
Lead Channel Setting Pacing Amplitude: 2 V
Lead Channel Setting Pacing Amplitude: 2.5 V
Lead Channel Setting Pacing Amplitude: 3 V
Lead Channel Setting Pacing Pulse Width: 0.4 ms
Lead Channel Setting Pacing Pulse Width: 0.4 ms
Lead Channel Setting Sensing Sensitivity: 0.45 mV

## 2021-08-08 NOTE — Progress Notes (Signed)
Remote ICD transmission.   

## 2021-08-17 ENCOUNTER — Encounter: Payer: Self-pay | Admitting: Registered Nurse

## 2021-08-17 ENCOUNTER — Other Ambulatory Visit: Payer: Self-pay

## 2021-08-17 DIAGNOSIS — F988 Other specified behavioral and emotional disorders with onset usually occurring in childhood and adolescence: Secondary | ICD-10-CM

## 2021-08-17 MED ORDER — LISDEXAMFETAMINE DIMESYLATE 50 MG PO CAPS: 50.0000 mg | ORAL_CAPSULE | Freq: Every day | ORAL | 0 refills | Status: DC

## 2021-08-17 NOTE — Telephone Encounter (Signed)
Patient is requesting a refill of the following medications: Requested Prescriptions   Pending Prescriptions Disp Refills   lisdexamfetamine (VYVANSE) 50 MG capsule 30 capsule 0    Sig: Take 1 capsule (50 mg total) by mouth daily.    Date of patient request: 08/17/2021 Last office visit: 07/06/2021 Date of last refill: 07/13/2021 Last refill amount: 30 capsules  Follow up time period per chart:

## 2021-08-17 NOTE — Telephone Encounter (Signed)
Medication request sent

## 2021-09-26 ENCOUNTER — Encounter: Payer: Self-pay | Admitting: Registered Nurse

## 2021-09-26 DIAGNOSIS — F988 Other specified behavioral and emotional disorders with onset usually occurring in childhood and adolescence: Secondary | ICD-10-CM

## 2021-09-27 MED ORDER — LISDEXAMFETAMINE DIMESYLATE 50 MG PO CAPS: 50.0000 mg | ORAL_CAPSULE | Freq: Every day | ORAL | 0 refills | Status: DC

## 2021-09-29 ENCOUNTER — Encounter: Payer: Self-pay | Admitting: Registered Nurse

## 2021-09-29 NOTE — Telephone Encounter (Signed)
Pt is aware the medication was sent in on 09/27/21

## 2021-09-29 NOTE — Telephone Encounter (Signed)
Pt is aware the medication was sent to the pharmacy on 09/27/21

## 2021-10-30 ENCOUNTER — Encounter: Payer: Self-pay | Admitting: Registered Nurse

## 2021-10-30 DIAGNOSIS — F988 Other specified behavioral and emotional disorders with onset usually occurring in childhood and adolescence: Secondary | ICD-10-CM

## 2021-10-31 ENCOUNTER — Ambulatory Visit (INDEPENDENT_AMBULATORY_CARE_PROVIDER_SITE_OTHER): Payer: Medicare Other

## 2021-10-31 DIAGNOSIS — I428 Other cardiomyopathies: Secondary | ICD-10-CM | POA: Diagnosis not present

## 2021-10-31 LAB — CUP PACEART REMOTE DEVICE CHECK
Battery Remaining Longevity: 15 mo
Battery Voltage: 2.88 V
Brady Statistic AP VP Percent: 0.18 %
Brady Statistic AP VS Percent: 0.02 %
Brady Statistic AS VP Percent: 98.54 %
Brady Statistic AS VS Percent: 1.26 %
Brady Statistic RA Percent Paced: 0.2 %
Brady Statistic RV Percent Paced: 98.51 %
Date Time Interrogation Session: 20230130022824
HighPow Impedance: 77 Ohm
Implantable Lead Implant Date: 20161114
Implantable Lead Implant Date: 20161114
Implantable Lead Implant Date: 20161114
Implantable Lead Location: 753858
Implantable Lead Location: 753859
Implantable Lead Location: 753860
Implantable Lead Model: 4598
Implantable Lead Model: 5076
Implantable Pulse Generator Implant Date: 20161114
Lead Channel Impedance Value: 342 Ohm
Lead Channel Impedance Value: 399 Ohm
Lead Channel Impedance Value: 418 Ohm
Lead Channel Impedance Value: 456 Ohm
Lead Channel Impedance Value: 475 Ohm
Lead Channel Impedance Value: 475 Ohm
Lead Channel Impedance Value: 646 Ohm
Lead Channel Impedance Value: 665 Ohm
Lead Channel Impedance Value: 779 Ohm
Lead Channel Impedance Value: 817 Ohm
Lead Channel Impedance Value: 950 Ohm
Lead Channel Impedance Value: 950 Ohm
Lead Channel Impedance Value: 988 Ohm
Lead Channel Pacing Threshold Amplitude: 0.625 V
Lead Channel Pacing Threshold Amplitude: 0.75 V
Lead Channel Pacing Threshold Amplitude: 1.5 V
Lead Channel Pacing Threshold Pulse Width: 0.4 ms
Lead Channel Pacing Threshold Pulse Width: 0.4 ms
Lead Channel Pacing Threshold Pulse Width: 0.4 ms
Lead Channel Sensing Intrinsic Amplitude: 2.25 mV
Lead Channel Sensing Intrinsic Amplitude: 2.25 mV
Lead Channel Sensing Intrinsic Amplitude: 22.375 mV
Lead Channel Sensing Intrinsic Amplitude: 22.375 mV
Lead Channel Setting Pacing Amplitude: 2 V
Lead Channel Setting Pacing Amplitude: 2.5 V
Lead Channel Setting Pacing Amplitude: 3 V
Lead Channel Setting Pacing Pulse Width: 0.4 ms
Lead Channel Setting Pacing Pulse Width: 0.4 ms
Lead Channel Setting Sensing Sensitivity: 0.45 mV

## 2021-10-31 MED ORDER — LISDEXAMFETAMINE DIMESYLATE 50 MG PO CAPS: 50.0000 mg | ORAL_CAPSULE | Freq: Every day | ORAL | 0 refills | Status: DC

## 2021-10-31 NOTE — Telephone Encounter (Signed)
Patient is requesting a refill of the following medications: Requested Prescriptions   Pending Prescriptions Disp Refills   lisdexamfetamine (VYVANSE) 50 MG capsule 30 capsule 0    Sig: Take 1 capsule (50 mg total) by mouth daily.    Date of patient request: 10/31/21 Last office visit: 07/06/21 Date of last refill: 09/27/21 Last refill amount: 30

## 2021-11-08 NOTE — Progress Notes (Signed)
Remote ICD transmission.   

## 2021-11-22 DIAGNOSIS — H2513 Age-related nuclear cataract, bilateral: Secondary | ICD-10-CM | POA: Diagnosis not present

## 2021-11-22 DIAGNOSIS — H43813 Vitreous degeneration, bilateral: Secondary | ICD-10-CM | POA: Diagnosis not present

## 2021-11-22 DIAGNOSIS — H43393 Other vitreous opacities, bilateral: Secondary | ICD-10-CM | POA: Diagnosis not present

## 2021-11-22 DIAGNOSIS — H33193 Other retinoschisis and retinal cysts, bilateral: Secondary | ICD-10-CM | POA: Diagnosis not present

## 2021-12-03 ENCOUNTER — Encounter: Payer: Self-pay | Admitting: Registered Nurse

## 2021-12-05 ENCOUNTER — Other Ambulatory Visit: Payer: Self-pay | Admitting: Registered Nurse

## 2021-12-05 ENCOUNTER — Other Ambulatory Visit: Payer: Self-pay

## 2021-12-05 DIAGNOSIS — F988 Other specified behavioral and emotional disorders with onset usually occurring in childhood and adolescence: Secondary | ICD-10-CM

## 2021-12-05 MED ORDER — LISDEXAMFETAMINE DIMESYLATE 50 MG PO CAPS: 50.0000 mg | ORAL_CAPSULE | Freq: Every day | ORAL | 0 refills | Status: DC

## 2021-12-05 NOTE — Telephone Encounter (Signed)
Medication request sent to PCP  

## 2021-12-05 NOTE — Telephone Encounter (Signed)
Patient is requesting a refill of the following medications: ?Requested Prescriptions  ? ?Pending Prescriptions Disp Refills  ? lisdexamfetamine (VYVANSE) 50 MG capsule 30 capsule 0  ?  Sig: Take 1 capsule (50 mg total) by mouth daily.  ? ? ?Date of patient request: 12/03/2021 ?Last office visit: 07/06/2021 ?Date of last refill: 10/31/2021 ?Last refill amount: 30 ?Follow up time period per chart: 01/17/2022 ? ?

## 2022-01-06 ENCOUNTER — Encounter: Payer: Self-pay | Admitting: Registered Nurse

## 2022-01-06 DIAGNOSIS — F988 Other specified behavioral and emotional disorders with onset usually occurring in childhood and adolescence: Secondary | ICD-10-CM

## 2022-01-06 NOTE — Telephone Encounter (Signed)
Patient is requesting a refill of the following medications: ?Requested Prescriptions  ? ?Pending Prescriptions Disp Refills  ? lisdexamfetamine (VYVANSE) 50 MG capsule 30 capsule 0  ?  Sig: Take 1 capsule (50 mg total) by mouth daily.  ? ? ?Date of patient request: 01/06/22 ?Last office visit: 07/06/21 ?Date of last refill: 12/05/21 ?Last refill amount: 30 ?Follow up time period per chart: 01/17/22 ? ?

## 2022-01-10 MED ORDER — LISDEXAMFETAMINE DIMESYLATE 50 MG PO CAPS: 50.0000 mg | ORAL_CAPSULE | Freq: Every day | ORAL | 0 refills | Status: DC

## 2022-01-17 ENCOUNTER — Ambulatory Visit: Payer: Medicare Other | Admitting: Registered Nurse

## 2022-01-24 ENCOUNTER — Ambulatory Visit: Payer: Medicare Other | Admitting: Registered Nurse

## 2022-01-26 ENCOUNTER — Ambulatory Visit (INDEPENDENT_AMBULATORY_CARE_PROVIDER_SITE_OTHER): Payer: Medicare Other | Admitting: Registered Nurse

## 2022-01-26 ENCOUNTER — Encounter: Payer: Self-pay | Admitting: Registered Nurse

## 2022-01-26 VITALS — BP 122/78 | HR 71 | Temp 97.5°F | Resp 16 | Ht 66.0 in | Wt 225.5 lb

## 2022-01-26 DIAGNOSIS — R350 Frequency of micturition: Secondary | ICD-10-CM

## 2022-01-26 DIAGNOSIS — F419 Anxiety disorder, unspecified: Secondary | ICD-10-CM

## 2022-01-26 DIAGNOSIS — N3000 Acute cystitis without hematuria: Secondary | ICD-10-CM | POA: Diagnosis not present

## 2022-01-26 DIAGNOSIS — E785 Hyperlipidemia, unspecified: Secondary | ICD-10-CM

## 2022-01-26 DIAGNOSIS — F988 Other specified behavioral and emotional disorders with onset usually occurring in childhood and adolescence: Secondary | ICD-10-CM | POA: Diagnosis not present

## 2022-01-26 DIAGNOSIS — F32A Depression, unspecified: Secondary | ICD-10-CM

## 2022-01-26 LAB — POCT URINALYSIS DIPSTICK
Bilirubin, UA: NEGATIVE
Blood, UA: NEGATIVE
Glucose, UA: NEGATIVE
Ketones, UA: NEGATIVE
Protein, UA: POSITIVE — AB
Spec Grav, UA: >=1.03 — AB (ref 1.010–1.025)
Urobilinogen, UA: 0.2 E.U./dL
pH, UA: 6 (ref 5.0–8.0)

## 2022-01-26 LAB — LIPID PANEL
Cholesterol: 236 mg/dL — ABNORMAL HIGH (ref 0–200)
HDL: 63 mg/dL (ref >39.00)
LDL Cholesterol: 146 mg/dL — ABNORMAL HIGH (ref 0–99)
NonHDL: 172.61
Total CHOL/HDL Ratio: 4
Triglycerides: 135 mg/dL (ref 0.0–149.0)
VLDL: 27 mg/dL (ref 0.0–40.0)

## 2022-01-26 MED ORDER — BUPROPION HCL ER (XL) 300 MG PO TB24: 300.0000 mg | ORAL_TABLET | Freq: Every day | ORAL | 1 refills | Status: DC

## 2022-01-26 MED ORDER — LISDEXAMFETAMINE DIMESYLATE 50 MG PO CAPS: 50.0000 mg | ORAL_CAPSULE | Freq: Every day | ORAL | 0 refills | Status: DC

## 2022-01-26 MED ORDER — SULFAMETHOXAZOLE-TRIMETHOPRIM 800-160 MG PO TABS: 1.0000 | ORAL_TABLET | Freq: Two times a day (BID) | ORAL | 0 refills | Status: DC

## 2022-01-26 NOTE — Patient Instructions (Addendum)
Ms. Tupou -  ? ?Great to see you ? ?Bactrim twice a day for three days. ? ?If symptoms persist, call me ? ?Otherwise, see you in 6 mo to check on things ? ?Thanks, ? ?Rich  ?

## 2022-01-26 NOTE — Assessment & Plan Note (Signed)
Recheck lipids today. Adjust medication as warranted. ?

## 2022-01-26 NOTE — Assessment & Plan Note (Signed)
Increase wellbutrin to '300mg'$  XL po qd. Med check in 5-6 weeks. ?

## 2022-01-26 NOTE — Progress Notes (Signed)
? ?Established Patient Office Visit ? ?Subjective:  ?Patient ID: Debra Holloway, female    DOB: 1955/12/30  Age: 66 y.o. MRN: [id number] ? ?CC:  ?Chief Complaint  ?Patient presents with  ? Medication Check  ? Possible UTI  ?  Urinary urgency and vaginal discharge that started a week ago ?  ? ? ?HPI ?Debra Holloway presents for med check, UTI ? ?Depression ?Has been on wellbutrin 150XL po qd ?Subtherapeutic effect. Crying spells. No HI/SI ? ?Possible UTI ?Pressure, frequency, urgency without voiding. ?No hematuria, flank pain, fevers, chills, sweats. ?Concern as her grandmother had UTI untreated led to delirium. ? ?Mixed Hyperlipidemia ?On Zetia. Could not tolerate statins.  ?Lab Results  ?Component Value Date  ? CHOL 253 (H) 07/06/2021  ? HDL 71.00 07/06/2021  ? [place] 155 (H) 07/06/2021  ? TRIG 134.0 07/06/2021  ? CHOLHDL 4 07/06/2021  ? ? ? ?Outpatient Medications Prior to Visit  ?Medication Sig Dispense Refill  ? b complex vitamins tablet Take 1 tablet by mouth daily.    ? carvedilol (COREG) 6.25 MG tablet Take 1 tablet (6.25 mg total) by mouth 2 (two) times daily. 180 tablet 3  ? Cholecalciferol (VITAMIN D PO) Take 2,000 mg by mouth daily.     ? fluticasone (FLONASE) 50 MCG/ACT nasal spray Place 1 spray into both nostrils daily.    ? furosemide (LASIX) 40 MG tablet Take 1 tablet (40 mg total) by mouth as needed. 30 tablet 11  ? lisinopril (ZESTRIL) 5 MG tablet Take 1 tablet (5 mg total) by mouth daily. 90 tablet 3  ? loratadine (CLARITIN) 10 MG tablet     ? potassium chloride (KLOR-CON) 10 MEQ tablet Take 10 mEq by mouth as needed (when take furosemide).    ? spironolactone (ALDACTONE) 25 MG tablet Take 0.5 tablets (12.5 mg total) by mouth daily. 45 tablet 3  ? buPROPion (WELLBUTRIN XL) 150 MG 24 hr tablet TAKE 1 TABLET(150 MG) BY MOUTH DAILY 90 tablet 3  ? lisdexamfetamine (VYVANSE) 50 MG capsule Take 1 capsule (50 mg total) by mouth daily. 30 capsule 0  ? ?No facility-administered medications prior to visit.   ? ? ?Review of Systems  ?Constitutional: Negative.   ?HENT: Negative.    ?Eyes: Negative.   ?Respiratory: Negative.    ?Cardiovascular: Negative.   ?Gastrointestinal: Negative.   ?Genitourinary: Negative.   ?Musculoskeletal: Negative.   ?Skin: Negative.   ?Neurological: Negative.   ?Psychiatric/Behavioral: Negative.    ? ?  ?Objective:  ?  ? ?BP 122/78   Pulse 71   Temp (!) 97.5 ?F (36.4 ?C) (Temporal)   Resp 16   Ht '5\' 6"'$  (1.676 m)   Wt 225 lb 8 oz (102.3 kg)   SpO2 98%   BMI 36.40 kg/m?  ? ?Wt Readings from Last 3 Encounters:  ?01/26/22 225 lb 8 oz (102.3 kg)  ?07/06/21 224 lb 12.8 oz (102 kg)  ?01/18/21 222 lb 3.2 oz (100.8 kg)  ? ?Physical Exam ?Vitals and nursing note reviewed.  ?Constitutional:   ?   General: She is not in acute distress. ?   Appearance: Normal appearance. She is normal weight. She is not ill-appearing, toxic-appearing or diaphoretic.  ?Cardiovascular:  ?   Rate and Rhythm: Normal rate and regular rhythm.  ?   Heart sounds: Normal heart sounds. No murmur heard. ?  No friction rub. No gallop.  ?Pulmonary:  ?   Effort: Pulmonary effort is normal. No respiratory distress.  ?   Breath sounds:  Normal breath sounds. No stridor. No wheezing, rhonchi or rales.  ?Chest:  ?   Chest wall: No tenderness.  ?Abdominal:  ?   Tenderness: There is no right CVA tenderness or left CVA tenderness.  ?Skin: ?   General: Skin is warm and dry.  ?Neurological:  ?   General: No focal deficit present.  ?   Mental Status: She is alert and oriented to person, place, and time. Mental status is at baseline.  ?Psychiatric:     ?   Mood and Affect: Mood normal.     ?   Behavior: Behavior normal.     ?   Thought Content: Thought content normal.     ?   Judgment: Judgment normal.  ? ? ?Results for orders placed or performed in visit on 01/26/22  ?POCT Urinalysis Dipstick  ?Result Value Ref Range  ? Color, UA yellow   ? Clarity, UA cloudy   ? Glucose, UA Negative Negative  ? Bilirubin, UA neg   ? Ketones, UA neg   ? Spec  Grav, UA >=1.030 (A) 1.010 - 1.025  ? Blood, UA neg   ? pH, UA 6.0 5.0 - 8.0  ? Protein, UA Positive (A) Negative  ? Urobilinogen, UA 0.2 0.2 or 1.0 E.U./dL  ? Nitrite, UA nig   ? Leukocytes, UA Large (3+) (A) Negative  ? Appearance    ? Odor    ? ? ? ? ?The 10-year ASCVD risk score (Arnett DK, et al., 2019) is: 4.6% ? ?  ?Assessment & Plan:  ? ?Problem List Items Addressed This Visit   ? ?  ? Other  ? Attention deficit disorder  ?  Stable on vyvanse. Continue x 6 mo. Recheck at that time. ? ?  ?  ? Relevant Medications  ? buPROPion (WELLBUTRIN XL) 300 MG 24 hr tablet  ? lisdexamfetamine (VYVANSE) 50 MG capsule  ? Dyslipidemia  ?  Recheck lipids today. Adjust medication as warranted. ? ?  ?  ? Relevant Orders  ? Lipid panel  ? Anxiety and depression  ?  Increase wellbutrin to '300mg'$  XL po qd. Med check in 5-6 weeks. ? ?  ?  ? Relevant Medications  ? buPROPion (WELLBUTRIN XL) 300 MG 24 hr tablet  ? ?Other Visit Diagnoses   ? ? Urinary frequency    -  Primary  ? Relevant Orders  ? POCT Urinalysis Dipstick (Completed)  ? Acute cystitis without hematuria      ? Relevant Medications  ? sulfamethoxazole-trimethoprim (BACTRIM DS) 800-160 MG tablet  ? ?  ? ? ?Meds ordered this encounter  ?Medications  ? buPROPion (WELLBUTRIN XL) 300 MG 24 hr tablet  ?  Sig: Take 1 tablet (300 mg total) by mouth daily.  ?  Dispense:  90 tablet  ?  Refill:  1  ?  Order Specific Question:   Supervising Provider  ?  Answer:   Carlota Raspberry, JEFFREY R [[id number]]  ? lisdexamfetamine (VYVANSE) 50 MG capsule  ?  Sig: Take 1 capsule (50 mg total) by mouth daily.  ?  Dispense:  30 capsule  ?  Refill:  0  ?  Order Specific Question:   Supervising Provider  ?  Answer:   Carlota Raspberry, JEFFREY R [[id number]]  ? sulfamethoxazole-trimethoprim (BACTRIM DS) 800-160 MG tablet  ?  Sig: Take 1 tablet by mouth 2 (two) times daily.  ?  Dispense:  6 tablet  ?  Refill:  0  ?  Order Specific Question:  Supervising Provider  ?  Answer:   Carlota Raspberry, JEFFREY R [[id number]]  ? ? ?Return in about 6  months (around 07/28/2022) for Chronic Conditions.  ? ?PLAN ?Bactrim po bid x 3 days for UTI. Insufficient sample for culture. Return if worsening or failing to improve. ? ?Maximiano Coss, NP ?

## 2022-01-26 NOTE — Assessment & Plan Note (Signed)
Stable on vyvanse. Continue x 6 mo. Recheck at that time. ?

## 2022-01-30 ENCOUNTER — Ambulatory Visit (INDEPENDENT_AMBULATORY_CARE_PROVIDER_SITE_OTHER): Payer: Medicare Other

## 2022-01-30 DIAGNOSIS — I428 Other cardiomyopathies: Secondary | ICD-10-CM

## 2022-01-30 LAB — CUP PACEART REMOTE DEVICE CHECK
Battery Remaining Longevity: 12 mo
Battery Voltage: 2.86 V
Brady Statistic AP VP Percent: 0.17 %
Brady Statistic AP VS Percent: 0.02 %
Brady Statistic AS VP Percent: 98.45 %
Brady Statistic AS VS Percent: 1.36 %
Brady Statistic RA Percent Paced: 0.19 %
Brady Statistic RV Percent Paced: 98.38 %
Date Time Interrogation Session: 20230429072939
HighPow Impedance: 81 Ohm
Implantable Lead Implant Date: 20161114
Implantable Lead Implant Date: 20161114
Implantable Lead Implant Date: 20161114
Implantable Lead Location: 753858
Implantable Lead Location: 753859
Implantable Lead Location: 753860
Implantable Lead Model: 4598
Implantable Lead Model: 5076
Implantable Pulse Generator Implant Date: 20161114
Lead Channel Impedance Value: 1007 Ohm
Lead Channel Impedance Value: 1007 Ohm
Lead Channel Impedance Value: 1026 Ohm
Lead Channel Impedance Value: 342 Ohm
Lead Channel Impedance Value: 399 Ohm
Lead Channel Impedance Value: 418 Ohm
Lead Channel Impedance Value: 475 Ohm
Lead Channel Impedance Value: 532 Ohm
Lead Channel Impedance Value: 532 Ohm
Lead Channel Impedance Value: 646 Ohm
Lead Channel Impedance Value: 703 Ohm
Lead Channel Impedance Value: 874 Ohm
Lead Channel Impedance Value: 874 Ohm
Lead Channel Pacing Threshold Amplitude: 0.625 V
Lead Channel Pacing Threshold Amplitude: 0.625 V
Lead Channel Pacing Threshold Amplitude: 1.5 V
Lead Channel Pacing Threshold Pulse Width: 0.4 ms
Lead Channel Pacing Threshold Pulse Width: 0.4 ms
Lead Channel Pacing Threshold Pulse Width: 0.4 ms
Lead Channel Sensing Intrinsic Amplitude: 1.125 mV
Lead Channel Sensing Intrinsic Amplitude: 1.125 mV
Lead Channel Sensing Intrinsic Amplitude: 21.625 mV
Lead Channel Sensing Intrinsic Amplitude: 21.625 mV
Lead Channel Setting Pacing Amplitude: 2 V
Lead Channel Setting Pacing Amplitude: 2.5 V
Lead Channel Setting Pacing Amplitude: 3 V
Lead Channel Setting Pacing Pulse Width: 0.4 ms
Lead Channel Setting Pacing Pulse Width: 0.4 ms
Lead Channel Setting Sensing Sensitivity: 0.45 mV

## 2022-02-09 ENCOUNTER — Other Ambulatory Visit: Payer: Self-pay | Admitting: Registered Nurse

## 2022-02-09 ENCOUNTER — Encounter: Payer: Self-pay | Admitting: Registered Nurse

## 2022-02-09 ENCOUNTER — Other Ambulatory Visit: Payer: Self-pay

## 2022-02-09 DIAGNOSIS — F988 Other specified behavioral and emotional disorders with onset usually occurring in childhood and adolescence: Secondary | ICD-10-CM

## 2022-02-09 MED ORDER — LISDEXAMFETAMINE DIMESYLATE 50 MG PO CAPS: 50.0000 mg | ORAL_CAPSULE | Freq: Every day | ORAL | 0 refills | Status: DC

## 2022-02-09 NOTE — Telephone Encounter (Signed)
Patients medication has been sent to the pharmacy ?

## 2022-02-09 NOTE — Telephone Encounter (Signed)
Patient is requesting a refill of the following medications: ?Requested Prescriptions  ? ?Pending Prescriptions Disp Refills  ? lisdexamfetamine (VYVANSE) 50 MG capsule 30 capsule 0  ?  Sig: Take 1 capsule (50 mg total) by mouth daily.  ? ? ?Date of patient request: 02/09/2022 ?Last office visit: 01/26/2022 ?Date of last refill: 01/26/2022 ?Last refill amount: 30 capsules  ?Follow up time period per chart:   ?

## 2022-02-13 NOTE — Progress Notes (Signed)
Remote ICD transmission.   

## 2022-03-15 ENCOUNTER — Encounter: Payer: Self-pay | Admitting: Internal Medicine

## 2022-03-15 ENCOUNTER — Other Ambulatory Visit: Payer: Self-pay | Admitting: Registered Nurse

## 2022-03-15 DIAGNOSIS — F988 Other specified behavioral and emotional disorders with onset usually occurring in childhood and adolescence: Secondary | ICD-10-CM

## 2022-03-15 MED ORDER — LISDEXAMFETAMINE DIMESYLATE 50 MG PO CAPS: 50.0000 mg | ORAL_CAPSULE | Freq: Every day | ORAL | 0 refills | Status: DC

## 2022-03-17 ENCOUNTER — Encounter: Payer: Self-pay | Admitting: Registered Nurse

## 2022-03-17 NOTE — Progress Notes (Deleted)
Electrophysiology Office Note Date: 03/17/2022  ID:  Debra Holloway, DOB 11-18-1955, MRN [id number]  PCP: Maximiano Coss, NP Primary Cardiologist: Ena Dawley, MD Electrophysiologist: Virl Axe, MD   CC: Routine ICD follow-up  Debra Holloway is a 66 y.o. female seen today for Virl Axe, MD for routine electrophysiology followup.  Since last being seen in our clinic the patient reports doing ***.  she denies chest pain, palpitations, dyspnea, PND, orthopnea, nausea, vomiting, dizziness, syncope, edema, weight gain, or early satiety. She has not had ICD shocks.   Device History: Medtronic BiV ICD implanted 08/2015 for NICM  Past Medical History:  Diagnosis Date   ADD (attention deficit disorder)    AICD (automatic cardioverter/defibrillator) present 2015/09/09   Anxiety    "just when my daddy died a few years ago" (2015-09-09)   AR (allergic rhinitis)    Chicken pox    Chronic combined systolic and diastolic CHF (congestive heart failure) ([place])    Depression    "just when my daddy died a few years ago" (September 09, 2015)   Dyslipidemia    Environmental allergies    H/O vitamin D deficiency    LBBB (left bundle branch block)    W/1st degree AV Block, Avoid Beta Blockers   NICM (nonischemic cardiomyopathy) (HCC)    OSA (obstructive sleep apnea) 03/18/2015   Severe OSA with AHI 30/hr; "suppose to wear mask but don't" (2015/09/09)   Seasonal affective disorder ([place])    Past Surgical History:  Procedure Laterality Date   CARDIAC CATHETERIZATION     DILATION AND CURETTAGE OF UTERUS  2003   EP IMPLANTABLE DEVICE N/A 09-Sep-2015   Procedure: BiV ICD Insertion CRT-D;  Surgeon: Deboraha Sprang, MD;  Location: [facility];  Service: Cardiovascular;  Laterality: N/A;   LEFT AND RIGHT HEART CATHETERIZATION WITH CORONARY ANGIOGRAM N/A 11/17/2014   Procedure: LEFT AND RIGHT HEART CATHETERIZATION WITH CORONARY ANGIOGRAM;  Surgeon: Troy Sine, MD;  Location: [facility] C[facility];   Service: Cardiovascular;  Laterality: N/A;   TOTAL ABDOMINAL HYSTERECTOMY  2004   TUBAL LIGATION     WISDOM TOOTH EXTRACTION  1986    Current Outpatient Medications  Medication Sig Dispense Refill   b complex vitamins tablet Take 1 tablet by mouth daily.     buPROPion (WELLBUTRIN XL) 300 MG 24 hr tablet Take 1 tablet (300 mg total) by mouth daily. 90 tablet 1   carvedilol (COREG) 6.25 MG tablet Take 1 tablet (6.25 mg total) by mouth 2 (two) times daily. 180 tablet 3   Cholecalciferol (VITAMIN D PO) Take 2,000 mg by mouth daily.      fluticasone (FLONASE) 50 MCG/ACT nasal spray Place 1 spray into both nostrils daily.     furosemide (LASIX) 40 MG tablet Take 1 tablet (40 mg total) by mouth as needed. 30 tablet 11   lisdexamfetamine (VYVANSE) 50 MG capsule Take 1 capsule (50 mg total) by mouth daily. 30 capsule 0   lisinopril (ZESTRIL) 5 MG tablet Take 1 tablet (5 mg total) by mouth daily. 90 tablet 3   loratadine (CLARITIN) 10 MG tablet      potassium chloride (KLOR-CON) 10 MEQ tablet Take 10 mEq by mouth as needed (when take furosemide).     spironolactone (ALDACTONE) 25 MG tablet Take 0.5 tablets (12.5 mg total) by mouth daily. 45 tablet 3   sulfamethoxazole-trimethoprim (BACTRIM DS) 800-160 MG tablet Take 1 tablet by mouth 2 (two) times daily. 6 tablet 0   No current facility-administered  medications for this visit.    Allergies:   Bee venom, Losartan, Erythromycin, Lovastatin, Penicillins, and Simvastatin   Social History: Social History   Socioeconomic History   Marital status: Divorced    Spouse name: Not on file   Number of children: Not on file   Years of education: Not on file   Highest education level: Not on file  Occupational History   Not on file  Tobacco Use   Smoking status: Never   Smokeless tobacco: Never  Vaping Use   Vaping Use: Never used  Substance and Sexual Activity   Alcohol use: Yes    Alcohol/week: 4.0 standard drinks of alcohol    Types: 4 Glasses  of wine per week   Drug use: No   Sexual activity: Not Currently  Other Topics Concern   Not on file  Social History Narrative   Not on file   Social Determinants of Health   Financial Resource Strain: Low Risk  (04/20/2020)   Overall Financial Resource Strain (CARDIA)    Difficulty of Paying Living Expenses: Not hard at all  Food Insecurity: No Food Insecurity (04/20/2020)   Hunger Vital Sign    Worried About Running Out of Food in the Last Year: Never true    Ran Out of Food in the Last Year: Never true  Transportation Needs: No Transportation Needs (04/20/2020)   PRAPARE - Hydrologist (Medical): No    Lack of Transportation (Non-Medical): No  Physical Activity: Inactive (04/20/2020)   Exercise Vital Sign    Days of Exercise per Week: 0 days    Minutes of Exercise per Session: 0 min  Stress: No Stress Concern Present (04/20/2020)   [place]    Feeling of Stress : Not at all  Social Connections: Socially Isolated (04/20/2020)   Social Connection and Isolation Panel [NHANES]    Frequency of Communication with Friends and Family: More than three times a week    Frequency of Social Gatherings with Friends and Family: Once a week    Attends Religious Services: Never    Marine scientist or Organizations: No    Attends Archivist Meetings: Never    Marital Status: Divorced  Human resources officer Violence: Not At Risk (04/20/2020)   Humiliation, Afraid, Rape, and Kick questionnaire    Fear of Current or Ex-Partner: No    Emotionally Abused: No    Physically Abused: No    Sexually Abused: No    Family History: Family History  Problem Relation Age of Onset   COPD Mother        Living   Pancreatic cancer Father 43       Deceased   Lung cancer Paternal Grandfather    Heart disease Paternal Grandmother    Hypertension Paternal Grandmother    Alzheimer's disease Maternal  Grandfather    Arthritis/Rheumatoid Maternal Grandmother    Heart disease Maternal Grandmother        Tachycardia   Cancer Paternal Uncle    Healthy Brother        x3   Healthy Daughter        x3   Breast cancer Sister 47    Review of Systems: All other systems reviewed and are otherwise negative except as noted above.   Physical Exam: There were no vitals filed for this visit.   GEN- The patient is well appearing, alert and oriented x 3 today.  HEENT: normocephalic, atraumatic; sclera clear, conjunctiva pink; hearing intact; oropharynx clear; neck supple, no JVP Lymph- no cervical lymphadenopathy Lungs- Clear to ausculation bilaterally, normal work of breathing.  No wheezes, rales, rhonchi Heart- Regular rate and rhythm, no murmurs, rubs or gallops, PMI not laterally displaced GI- soft, non-tender, non-distended, bowel sounds present, no hepatosplenomegaly Extremities- no clubbing or cyanosis. No edema; DP/PT/radial pulses 2+ bilaterally MS- no significant deformity or atrophy Skin- warm and dry, no rash or lesion; ICD pocket well healed Psych- euthymic mood, full affect Neuro- strength and sensation are intact  ICD interrogation- reviewed in detail today,  See PACEART report  EKG:  EKG is ordered today. Personal review of EKG ordered {Blank single:19197::"today","***"} shows ***  Recent Labs: 07/06/2021: ALT 19; BUN 16; Creatinine, Ser 0.88; Hemoglobin 13.5; Platelets 268.0; Potassium 4.4; Sodium 137; TSH 2.82   Wt Readings from Last 3 Encounters:  01/26/22 225 lb 8 oz (102.3 kg)  07/06/21 224 lb 12.8 oz (102 kg)  01/18/21 222 lb 3.2 oz (100.8 kg)     Other studies Reviewed: Additional studies/ records that were reviewed today include: Previous EP office notes.   Assessment and Plan:  1.  NICM with LBBB s/p Medtronic CRT-D  euvolemic today Stable on an appropriate medical regimen Normal ICD function See Pace Art report No changes today  2. Chronic diastolic  CHF 3. DOE Volume status ***   Current medicines are reviewed at length with the patient today.   =  Labs/ tests ordered today include: *** No orders of the defined types were placed in this encounter.    Disposition:   Follow up with {Blank single:19197::"Dr. Allred","Dr. Arlan Organ. Klein","Dr. Camnitz","Dr. Lambert","EP APP"} in {Blank single:19197::"2 weeks","4 weeks","3 months","6 months","12 months","as usual post gen change"}    Signed, Annamaria Helling  03/17/2022 2:42 PM  [address] [place] [address] [place] [address] [phone] (office) [phone] (fax)

## 2022-03-20 ENCOUNTER — Encounter: Payer: Medicare Other | Admitting: Student

## 2022-04-21 ENCOUNTER — Other Ambulatory Visit: Payer: Self-pay | Admitting: Family Medicine

## 2022-04-21 ENCOUNTER — Encounter: Payer: Self-pay | Admitting: Registered Nurse

## 2022-04-21 DIAGNOSIS — F988 Other specified behavioral and emotional disorders with onset usually occurring in childhood and adolescence: Secondary | ICD-10-CM

## 2022-04-21 MED ORDER — LISDEXAMFETAMINE DIMESYLATE 50 MG PO CAPS: 50.0000 mg | ORAL_CAPSULE | Freq: Every day | ORAL | 0 refills | Status: DC

## 2022-04-21 NOTE — Telephone Encounter (Signed)
Last visit 01/26/22  Last fill 03/17/22 for #30 no refills  Kathrin Ruddy, NP

## 2022-04-21 NOTE — Telephone Encounter (Signed)
Patient is requesting a refill of the following medications: Requested Prescriptions   Pending Prescriptions Disp Refills   lisdexamfetamine (VYVANSE) 50 MG capsule 30 capsule 0    Sig: Take 1 capsule (50 mg total) by mouth daily.    Date of patient request: 04/21/22 Last office visit: 01/26/22 Date of last refill: 03/15/22 Last refill amount: 30

## 2022-05-01 ENCOUNTER — Ambulatory Visit (INDEPENDENT_AMBULATORY_CARE_PROVIDER_SITE_OTHER): Payer: Medicare Other

## 2022-05-01 DIAGNOSIS — I428 Other cardiomyopathies: Secondary | ICD-10-CM | POA: Diagnosis not present

## 2022-05-01 DIAGNOSIS — I447 Left bundle-branch block, unspecified: Secondary | ICD-10-CM

## 2022-05-02 ENCOUNTER — Other Ambulatory Visit: Payer: Self-pay

## 2022-05-02 ENCOUNTER — Telehealth: Payer: Self-pay | Admitting: Internal Medicine

## 2022-05-02 LAB — CUP PACEART REMOTE DEVICE CHECK
Battery Remaining Longevity: 8 mo
Battery Voltage: 2.84 V
Brady Statistic AP VP Percent: 0.15 %
Brady Statistic AP VS Percent: 0.02 %
Brady Statistic AS VP Percent: 98.7 %
Brady Statistic AS VS Percent: 1.12 %
Brady Statistic RA Percent Paced: 0.18 %
Brady Statistic RV Percent Paced: 98.65 %
Date Time Interrogation Session: 20230801123229
HighPow Impedance: 80 Ohm
Implantable Lead Implant Date: 20161114
Implantable Lead Implant Date: 20161114
Implantable Lead Implant Date: 20161114
Implantable Lead Location: 753858
Implantable Lead Location: 753859
Implantable Lead Location: 753860
Implantable Lead Model: 4598
Implantable Lead Model: 5076
Implantable Pulse Generator Implant Date: 20161114
Lead Channel Impedance Value: 342 Ohm
Lead Channel Impedance Value: 418 Ohm
Lead Channel Impedance Value: 418 Ohm
Lead Channel Impedance Value: 418 Ohm
Lead Channel Impedance Value: 418 Ohm
Lead Channel Impedance Value: 456 Ohm
Lead Channel Impedance Value: 608 Ohm
Lead Channel Impedance Value: 608 Ohm
Lead Channel Impedance Value: 722 Ohm
Lead Channel Impedance Value: 760 Ohm
Lead Channel Impedance Value: 893 Ohm
Lead Channel Impedance Value: 893 Ohm
Lead Channel Impedance Value: 893 Ohm
Lead Channel Pacing Threshold Amplitude: 0.625 V
Lead Channel Pacing Threshold Amplitude: 0.625 V
Lead Channel Pacing Threshold Amplitude: 1.375 V
Lead Channel Pacing Threshold Pulse Width: 0.4 ms
Lead Channel Pacing Threshold Pulse Width: 0.4 ms
Lead Channel Pacing Threshold Pulse Width: 0.4 ms
Lead Channel Sensing Intrinsic Amplitude: 2.5 mV
Lead Channel Sensing Intrinsic Amplitude: 2.5 mV
Lead Channel Sensing Intrinsic Amplitude: 22.875 mV
Lead Channel Sensing Intrinsic Amplitude: 22.875 mV
Lead Channel Setting Pacing Amplitude: 2 V
Lead Channel Setting Pacing Amplitude: 2.5 V
Lead Channel Setting Pacing Amplitude: 3 V
Lead Channel Setting Pacing Pulse Width: 0.4 ms
Lead Channel Setting Pacing Pulse Width: 0.4 ms
Lead Channel Setting Sensing Sensitivity: 0.45 mV

## 2022-05-02 MED ORDER — CARVEDILOL 6.25 MG PO TABS: 6.2500 mg | ORAL_TABLET | Freq: Two times a day (BID) | ORAL | 0 refills | Status: DC

## 2022-05-02 MED ORDER — POTASSIUM CHLORIDE ER 10 MEQ PO TBCR: 10.0000 meq | EXTENDED_RELEASE_TABLET | ORAL | 0 refills | Status: DC | PRN

## 2022-05-02 MED ORDER — SPIRONOLACTONE 25 MG PO TABS: 12.5000 mg | ORAL_TABLET | Freq: Every day | ORAL | 0 refills | Status: DC

## 2022-05-02 MED ORDER — CARVEDILOL 6.25 MG PO TABS
6.2500 mg | ORAL_TABLET | Freq: Two times a day (BID) | ORAL | 0 refills | Status: DC
Start: 2022-05-02 — End: 2022-05-02

## 2022-05-02 MED ORDER — LISINOPRIL 5 MG PO TABS: 5.0000 mg | ORAL_TABLET | Freq: Every day | ORAL | 0 refills | Status: DC

## 2022-05-02 MED ORDER — FUROSEMIDE 40 MG PO TABS: 40.0000 mg | ORAL_TABLET | ORAL | 0 refills | Status: DC | PRN

## 2022-05-02 NOTE — Telephone Encounter (Signed)
*  STAT* If patient is at the pharmacy, call can be transferred to refill team.   1. Which medications need to be refilled? (please list name of each medication and dose if known)  potassium chloride (KLOR-CON) 10 MEQ tablet carvedilol (COREG) 6.25 MG tablet spironolactone (ALDACTONE) 25 MG tablet lisinopril (ZESTRIL) 5 MG tablet   2. Which pharmacy/location (including street and city if local pharmacy) is medication to be sent to? [place], [address] [place]  3. Do they need a 30 day or 90 day supply? 90 day  Patient has appt on 07/10/22

## 2022-05-02 NOTE — Telephone Encounter (Signed)
Pt's medications were sent to pt's pharmacy as requested. Confirmation received.  

## 2022-05-02 NOTE — Addendum Note (Signed)
Addended by: Carter Kitten D on: 05/02/2022 03:06 PM   Modules accepted: Orders

## 2022-05-03 ENCOUNTER — Ambulatory Visit (INDEPENDENT_AMBULATORY_CARE_PROVIDER_SITE_OTHER): Payer: Medicare Other | Admitting: Registered Nurse

## 2022-05-03 ENCOUNTER — Encounter: Payer: Self-pay | Admitting: Registered Nurse

## 2022-05-03 ENCOUNTER — Other Ambulatory Visit: Payer: Self-pay

## 2022-05-03 VITALS — BP 128/80 | HR 97 | Temp 98.0°F | Resp 18 | Ht 66.0 in | Wt 223.0 lb

## 2022-05-03 DIAGNOSIS — R350 Frequency of micturition: Secondary | ICD-10-CM | POA: Diagnosis not present

## 2022-05-03 DIAGNOSIS — F988 Other specified behavioral and emotional disorders with onset usually occurring in childhood and adolescence: Secondary | ICD-10-CM | POA: Diagnosis not present

## 2022-05-03 DIAGNOSIS — N3000 Acute cystitis without hematuria: Secondary | ICD-10-CM

## 2022-05-03 MED ORDER — MIRABEGRON ER 50 MG PO TB24: 50.0000 mg | ORAL_TABLET | Freq: Every day | ORAL | 1 refills | Status: DC

## 2022-05-03 MED ORDER — LISDEXAMFETAMINE DIMESYLATE 50 MG PO CAPS: 50.0000 mg | ORAL_CAPSULE | Freq: Every day | ORAL | 0 refills | Status: DC

## 2022-05-03 MED ORDER — NITROFURANTOIN MONOHYD MACRO 100 MG PO CAPS: 100.0000 mg | ORAL_CAPSULE | Freq: Two times a day (BID) | ORAL | 0 refills | Status: AC

## 2022-05-03 NOTE — Patient Instructions (Signed)
Ms. Debra Holloway to see you!  Refills x 6 mo  I have sent macrobid to take for 5 days twice daily.  I recommend these providers:  Berniece Pap, MD Dimas Chyle, MD Agustina Caroli, MD Myrna Blazer Early, NP Jeralyn Ruths, DNP   Thank you for letting me take part in your care,  Rich

## 2022-05-03 NOTE — Progress Notes (Signed)
Established Patient Office Visit  Subjective:  Patient ID: Debra Holloway, female    DOB: May 07, 1956  Age: 66 y.o. MRN: [id number]  CC:  Chief Complaint  Patient presents with   Medication Refill    Patient states she is here for a medication refill. Patient states she might have a UTI     HPI Debra Holloway presents for ADHD. UTI.  ADD Vyvanse '50mg'$  po qd Good effect, no AE. Wants to continue PDMP consulted, no concerns.  UTI Dysuria, frequency, urgency Suprapubic pressure Some itching No hematuria.   Outpatient Medications Prior to Visit  Medication Sig Dispense Refill   b complex vitamins tablet Take 1 tablet by mouth daily.     buPROPion (WELLBUTRIN XL) 300 MG 24 hr tablet Take 1 tablet (300 mg total) by mouth daily. 90 tablet 1   carvedilol (COREG) 6.25 MG tablet Take 1 tablet (6.25 mg total) by mouth 2 (two) times daily. 180 tablet 0   Cholecalciferol (VITAMIN D PO) Take 2,000 mg by mouth daily.      fluticasone (FLONASE) 50 MCG/ACT nasal spray Place 1 spray into both nostrils daily.     furosemide (LASIX) 40 MG tablet Take 1 tablet (40 mg total) by mouth as needed. 90 tablet 0   lisinopril (ZESTRIL) 5 MG tablet Take 1 tablet (5 mg total) by mouth daily. 90 tablet 0   loratadine (CLARITIN) 10 MG tablet      potassium chloride (KLOR-CON) 10 MEQ tablet Take 1 tablet (10 mEq total) by mouth as needed (when take furosemide). 90 tablet 0   spironolactone (ALDACTONE) 25 MG tablet Take 0.5 tablets (12.5 mg total) by mouth daily. 45 tablet 0   sulfamethoxazole-trimethoprim (BACTRIM DS) 800-160 MG tablet Take 1 tablet by mouth 2 (two) times daily. 6 tablet 0   lisdexamfetamine (VYVANSE) 50 MG capsule Take 1 capsule (50 mg total) by mouth daily. 30 capsule 0   No facility-administered medications prior to visit.    Review of Systems  Constitutional: Negative.   HENT: Negative.    Eyes: Negative.   Respiratory: Negative.    Cardiovascular: Negative.   Gastrointestinal:  Negative.   Genitourinary: Negative.   Musculoskeletal: Negative.   Skin: Negative.   Neurological: Negative.   Psychiatric/Behavioral: Negative.    All other systems reviewed and are negative.     Objective:     BP 128/80   Pulse 97   Temp 98 F (36.7 C) (Temporal)   Resp 18   Ht '5\' 6"'$  (1.676 m)   Wt 223 lb (101.2 kg)   SpO2 97%   BMI 35.99 kg/m   Wt Readings from Last 3 Encounters:  05/03/22 223 lb (101.2 kg)  01/26/22 225 lb 8 oz (102.3 kg)  07/06/21 224 lb 12.8 oz (102 kg)   Physical Exam Vitals and nursing note reviewed.  Constitutional:      General: She is not in acute distress.    Appearance: Normal appearance. She is normal weight. She is not ill-appearing, toxic-appearing or diaphoretic.  Cardiovascular:     Rate and Rhythm: Normal rate and regular rhythm.     Heart sounds: Normal heart sounds. No murmur heard.    No friction rub. No gallop.  Pulmonary:     Effort: Pulmonary effort is normal. No respiratory distress.     Breath sounds: Normal breath sounds. No stridor. No wheezing, rhonchi or rales.  Chest:     Chest wall: No tenderness.  Skin:    General:  Skin is warm and dry.  Neurological:     General: No focal deficit present.     Mental Status: She is alert and oriented to person, place, and time. Mental status is at baseline.  Psychiatric:        Mood and Affect: Mood normal.        Behavior: Behavior normal.        Thought Content: Thought content normal.        Judgment: Judgment normal.     No results found for any visits on 05/03/22.    The 10-year ASCVD risk score (Arnett DK, et al., 2019) is: 5.2%    Assessment & Plan:   Problem List Items Addressed This Visit       Other   Attention deficit disorder   Relevant Medications   lisdexamfetamine (VYVANSE) 50 MG capsule   lisdexamfetamine (VYVANSE) 50 MG capsule (Start on 08/01/2022)   Other Visit Diagnoses     Urinary frequency    -  Primary   Relevant Medications    mirabegron ER (MYRBETRIQ) 50 MG TB24 tablet   Other Relevant Orders   POCT urinalysis dipstick   Acute cystitis without hematuria       Relevant Medications   nitrofurantoin, macrocrystal-monohydrate, (MACROBID) 100 MG capsule       Meds ordered this encounter  Medications   lisdexamfetamine (VYVANSE) 50 MG capsule    Sig: Take 1 capsule (50 mg total) by mouth daily.    Dispense:  90 capsule    Refill:  0    Order Specific Question:   Supervising Provider    Answer:   Carlota Raspberry, JEFFREY R [[id number]]   lisdexamfetamine (VYVANSE) 50 MG capsule    Sig: Take 1 capsule (50 mg total) by mouth daily.    Dispense:  90 capsule    Refill:  0    Order Specific Question:   Supervising Provider    Answer:   Carlota Raspberry, JEFFREY R [[id number]]   nitrofurantoin, macrocrystal-monohydrate, (MACROBID) 100 MG capsule    Sig: Take 1 capsule (100 mg total) by mouth 2 (two) times daily for 5 days.    Dispense:  10 capsule    Refill:  0    Order Specific Question:   Supervising Provider    Answer:   Carlota Raspberry, JEFFREY R [[id number]]   mirabegron ER (MYRBETRIQ) 50 MG TB24 tablet    Sig: Take 1 tablet (50 mg total) by mouth daily.    Dispense:  90 tablet    Refill:  1    Order Specific Question:   Supervising Provider    Answer:   Carlota Raspberry, JEFFREY R [[id number]]    Return if symptoms worsen or fail to improve.   PLAN Tx UTI with macrobid '100mg'$  po bid ac Refill vyvanse x 6 mo Follow up with new provider in 6 mo Patient encouraged to call clinic with any questions, comments, or concerns.   Maximiano Coss, NP

## 2022-05-24 ENCOUNTER — Telehealth: Payer: Self-pay | Admitting: Registered Nurse

## 2022-05-24 ENCOUNTER — Other Ambulatory Visit: Payer: Self-pay | Admitting: Family Medicine

## 2022-05-24 DIAGNOSIS — F988 Other specified behavioral and emotional disorders with onset usually occurring in childhood and adolescence: Secondary | ICD-10-CM

## 2022-05-24 MED ORDER — LISDEXAMFETAMINE DIMESYLATE 50 MG PO CAPS: 50.0000 mg | ORAL_CAPSULE | Freq: Every day | ORAL | 0 refills | Status: DC

## 2022-05-24 NOTE — Telephone Encounter (Signed)
Chart reviewed, Vyvanse 50 mg daily noted with good effect, continued on same at her August 2 visit with Rich.  Controlled substance database reviewed, last filled for #30 on 04/21/2022.  Refill ordered as prior provider no longer in office.  She will need to establish with new PCP for ongoing prescriptions.

## 2022-05-24 NOTE — Telephone Encounter (Signed)
Encourage patient to contact the pharmacy for refills or they can request refills through [facility]  (Please schedule appointment if patient has not been seen in over a year)    WHAT PHARMACY WOULD THEY LIKE THIS SENT TO: [id number] - [place], [place] - [address]: vyvanse 50 mg   NOTES/COMMENTS FROM PATIENT: pt need a refill on her medication. Pt last appt was 05/03/22.      [place] office please notify patient: It takes 48-72 hours to process rx refill requests Ask patient to call pharmacy to ensure rx is ready before heading there.

## 2022-05-25 ENCOUNTER — Other Ambulatory Visit: Payer: Self-pay

## 2022-05-25 DIAGNOSIS — F988 Other specified behavioral and emotional disorders with onset usually occurring in childhood and adolescence: Secondary | ICD-10-CM

## 2022-05-26 MED ORDER — LISDEXAMFETAMINE DIMESYLATE 50 MG PO CAPS: 50.0000 mg | ORAL_CAPSULE | Freq: Every day | ORAL | 0 refills | Status: DC

## 2022-05-26 NOTE — Telephone Encounter (Signed)
Message received that unfortunately the Vyvanse prescription that I ordered was not able to be filled as on prescription earliest fill date is marked for  October.  I will correct this and resend it now.

## 2022-05-30 ENCOUNTER — Ambulatory Visit (INDEPENDENT_AMBULATORY_CARE_PROVIDER_SITE_OTHER): Payer: Medicare Other | Admitting: Family Medicine

## 2022-05-30 ENCOUNTER — Encounter: Payer: Self-pay | Admitting: Family Medicine

## 2022-05-30 VITALS — BP 116/77 | HR 75 | Temp 97.3°F | Ht 66.0 in | Wt 227.6 lb

## 2022-05-30 DIAGNOSIS — I5022 Chronic systolic (congestive) heart failure: Secondary | ICD-10-CM | POA: Diagnosis not present

## 2022-05-30 DIAGNOSIS — F32A Depression, unspecified: Secondary | ICD-10-CM | POA: Diagnosis not present

## 2022-05-30 DIAGNOSIS — N3281 Overactive bladder: Secondary | ICD-10-CM

## 2022-05-30 DIAGNOSIS — F419 Anxiety disorder, unspecified: Secondary | ICD-10-CM | POA: Diagnosis not present

## 2022-05-30 DIAGNOSIS — F909 Attention-deficit hyperactivity disorder, unspecified type: Secondary | ICD-10-CM | POA: Diagnosis not present

## 2022-05-30 DIAGNOSIS — I420 Dilated cardiomyopathy: Secondary | ICD-10-CM | POA: Diagnosis not present

## 2022-05-30 DIAGNOSIS — E785 Hyperlipidemia, unspecified: Secondary | ICD-10-CM | POA: Diagnosis not present

## 2022-05-30 DIAGNOSIS — I480 Paroxysmal atrial fibrillation: Secondary | ICD-10-CM

## 2022-05-30 NOTE — Progress Notes (Signed)
Debra Holloway is a 66 y.o. female who presents today for an office visit.  She is transferring care to this office.   Assessment/Plan:  Chronic Problems Addressed Today: Chronic systolic heart failure ([place]) Follows with cardiology. No signs of volume overload.  Continue current medication regimen.  Nonischemic dilated cardiomyopathy ([place]) Follows with cardiology.  Symptoms have been stable.  Dyslipidemia She will come back soon for CPE and we can check lipids at that time.  ADHD Database was reviewed without red flags.  She is on Vyvanse 50 mg daily.  Works well to manage her ability to stay focused and on task.  No significant side effects.  Does not need refill today.  She will follow-up again in a couple months for CPE.  Overactive bladder Not controlled.  Previous PCP tried prescribing Myrbetriq which has not given her much relief.  We will refer her to urogynecology.  PAF (paroxysmal atrial fibrillation) (HCC) Regular rate and rhythm today.  Continue management per cardiology.  Anxiety and depression Stable on Wellbutrin 300 mg daily.     Subjective:  HPI:  See A/p for status of chronic conditions.   She has no acute concerns today.   PMH:  The following were reviewed and entered/updated in epic: Past Medical History:  Diagnosis Date   ADD (attention deficit disorder)    AICD (automatic cardioverter/defibrillator) present August 25, 2015   Anxiety    "just when my daddy died a few years ago" (25-Aug-2015)   AR (allergic rhinitis)    Chicken pox    Chronic combined systolic and diastolic CHF (congestive heart failure) ([place])    Depression    "just when my daddy died a few years ago" (2015/08/25)   Dyslipidemia    Environmental allergies    H/O vitamin D deficiency    LBBB (left bundle branch block)    W/1st degree AV Block, Avoid Beta Blockers   NICM (nonischemic cardiomyopathy) (HCC)    OSA (obstructive sleep apnea) 03/18/2015   Severe OSA with AHI 30/hr;  "suppose to wear mask but don't" (08/25/2015)   Seasonal affective disorder ([place])    Patient Active Problem List   Diagnosis Date Noted   Overactive bladder 05/30/2022   PAF (paroxysmal atrial fibrillation) ([place]) 07/06/2021   ICD (implantable cardioverter-defibrillator) in place 04/19/2020   Anxiety and depression 09/21/2016   OSA (obstructive sleep apnea) 70/62/3762   Chronic systolic heart failure ([place]) 12/07/2014   Nonischemic dilated cardiomyopathy (HCC)    LBBB (left bundle branch block)    Dyslipidemia    Hot flashes, menopausal 09/18/2014   ADHD 09/06/2013   Environmental allergies 09/06/2013   Past Surgical History:  Procedure Laterality Date   CARDIAC CATHETERIZATION     DILATION AND CURETTAGE OF UTERUS  2003   EP IMPLANTABLE DEVICE N/A 2015-08-25   Procedure: BiV ICD Insertion CRT-D;  Surgeon: Deboraha Sprang, MD;  Location: [place] [facility];  Service: Cardiovascular;  Laterality: N/A;   LEFT AND RIGHT HEART CATHETERIZATION WITH CORONARY ANGIOGRAM N/A 11/17/2014   Procedure: LEFT AND RIGHT HEART CATHETERIZATION WITH CORONARY ANGIOGRAM;  Surgeon: Troy Sine, MD;  Location: [facility] C[facility];  Service: Cardiovascular;  Laterality: N/A;   TOTAL ABDOMINAL HYSTERECTOMY  2004   TUBAL LIGATION     WISDOM TOOTH EXTRACTION  1986    Family History  Problem Relation Age of Onset   COPD Mother        Living   Pancreatic cancer Father 66  Deceased   Lung cancer Paternal Grandfather    Heart disease Paternal Grandmother    Hypertension Paternal Grandmother    Alzheimer's disease Maternal Grandfather    Arthritis/Rheumatoid Maternal Grandmother    Heart disease Maternal Grandmother        Tachycardia   Cancer Paternal Uncle    Healthy Brother        x3   Healthy Daughter        x3   Breast cancer Sister 49    Medications- reviewed and updated Current Outpatient Medications  Medication Sig Dispense Refill   b complex vitamins tablet Take 1 tablet by mouth daily.      buPROPion (WELLBUTRIN XL) 300 MG 24 hr tablet Take 1 tablet (300 mg total) by mouth daily. 90 tablet 1   carvedilol (COREG) 6.25 MG tablet Take 1 tablet (6.25 mg total) by mouth 2 (two) times daily. 180 tablet 0   Cholecalciferol (VITAMIN D PO) Take 2,000 mg by mouth daily.      fluticasone (FLONASE) 50 MCG/ACT nasal spray Place 1 spray into both nostrils daily.     furosemide (LASIX) 40 MG tablet Take 1 tablet (40 mg total) by mouth as needed. 90 tablet 0   lisdexamfetamine (VYVANSE) 50 MG capsule Take 1 capsule (50 mg total) by mouth daily. 30 capsule 0   lisinopril (ZESTRIL) 5 MG tablet Take 1 tablet (5 mg total) by mouth daily. 90 tablet 0   loratadine (CLARITIN) 10 MG tablet      mirabegron ER (MYRBETRIQ) 50 MG TB24 tablet Take 1 tablet (50 mg total) by mouth daily. 90 tablet 1   potassium chloride (KLOR-CON) 10 MEQ tablet Take 1 tablet (10 mEq total) by mouth as needed (when take furosemide). 90 tablet 0   spironolactone (ALDACTONE) 25 MG tablet Take 0.5 tablets (12.5 mg total) by mouth daily. 45 tablet 0   No current facility-administered medications for this visit.    Allergies-reviewed and updated Allergies  Allergen Reactions   Bee Venom Anaphylaxis   Losartan Other (See Comments)    Pt reports cough and insomnia.   Erythromycin Itching and Rash   Lovastatin Other (See Comments)    weakness   Penicillins Hives, Itching and Rash   Simvastatin Other (See Comments)    weakness    Social History   Socioeconomic History   Marital status: Divorced    Spouse name: Not on file   Number of children: Not on file   Years of education: Not on file   Highest education level: Not on file  Occupational History   Not on file  Tobacco Use   Smoking status: Never   Smokeless tobacco: Never  Vaping Use   Vaping Use: Never used  Substance and Sexual Activity   Alcohol use: Yes    Alcohol/week: 4.0 standard drinks of alcohol    Types: 4 Glasses of wine per week   Drug use: No    Sexual activity: Not Currently  Other Topics Concern   Not on file  Social History Narrative   Not on file   Social Determinants of Health   Financial Resource Strain: Low Risk  (04/20/2020)   Overall Financial Resource Strain (CARDIA)    Difficulty of Paying Living Expenses: Not hard at all  Food Insecurity: No Food Insecurity (04/20/2020)   Hunger Vital Sign    Worried About Running Out of Food in the Last Year: Never true    [place] in the Last Year:  Never true  Transportation Needs: No Transportation Needs (04/20/2020)   PRAPARE - Hydrologist (Medical): No    Lack of Transportation (Non-Medical): No  Physical Activity: Inactive (04/20/2020)   Exercise Vital Sign    Days of Exercise per Week: 0 days    Minutes of Exercise per Session: 0 min  Stress: No Stress Concern Present (04/20/2020)   [place]    Feeling of Stress : Not at all  Social Connections: Socially Isolated (04/20/2020)   Social Connection and Isolation Panel [NHANES]    Frequency of Communication with Friends and Family: More than three times a week    Frequency of Social Gatherings with Friends and Family: Once a week    Attends Religious Services: Never    Marine scientist or Organizations: No    Attends Music therapist: Never    Marital Status: Divorced         Objective:  Physical Exam: BP 116/77   Pulse 75   Temp (!) 97.3 F (36.3 C) (Temporal)   Ht '5\' 6"'$  (1.676 m)   Wt 227 lb 9.6 oz (103.2 kg)   SpO2 96%   BMI 36.74 kg/m   Gen: No acute distress, resting comfortably CV: Regular rate and rhythm with no murmurs appreciated Pulm: Normal work of breathing, clear to auscultation bilaterally with no crackles, wheezes, or rhonchi Neuro: Grossly normal, moves all extremities Psych: Normal affect and thought content  Time Spent: 45 minutes of total time was spent on the date of  the encounter performing the following actions: chart review prior to seeing the patient including visits with previous PCP, obtaining history, performing a medically necessary exam, counseling on the treatment plan, placing orders, and documenting in our EHR.        Algis Greenhouse. Jerline Pain, MD 05/30/2022 8:13 AM

## 2022-05-30 NOTE — Assessment & Plan Note (Signed)
Database was reviewed without red flags.  She is on Vyvanse 50 mg daily.  Works well to manage her ability to stay focused and on task.  No significant side effects.  Does not need refill today.  She will follow-up again in a couple months for CPE.

## 2022-05-30 NOTE — Assessment & Plan Note (Signed)
She will come back soon for CPE and we can check lipids at that time.

## 2022-05-30 NOTE — Assessment & Plan Note (Signed)
Not controlled.  Previous PCP tried prescribing Myrbetriq which has not given her much relief.  We will refer her to urogynecology.

## 2022-05-30 NOTE — Assessment & Plan Note (Signed)
Follows with cardiology.  Symptoms have been stable.

## 2022-05-30 NOTE — Assessment & Plan Note (Signed)
Stable on Wellbutrin 300 mg daily. 

## 2022-05-30 NOTE — Assessment & Plan Note (Addendum)
Follows with cardiology. No signs of volume overload.  Continue current medication regimen.

## 2022-05-30 NOTE — Patient Instructions (Signed)
It was very nice to see you today!  Keep up the good work!  I will refer you to see a urogynecologist.  No medication changes today.  We will see you back in October for your physical. Please come back sooner if needed.   Take care, Dr Jerline Pain  PLEASE NOTE:  If you had any lab tests please let us know if you have not heard back within a few days. You may see your results on mychart before we have a chance to review them but we will give you a call once they are reviewed by [place]. If we ordered any referrals today, please let us know if you have not heard from their office within the next week.   Please try these tips to maintain a healthy lifestyle:  Eat at least 3 REAL meals and 1-2 snacks per day.  Aim for no more than 5 hours between eating.  If you eat breakfast, please do so within one hour of getting up.   Each meal should contain half fruits/vegetables, one quarter protein, and one quarter carbs (no bigger than a computer mouse)  Cut down on sweet beverages. This includes juice, soda, and sweet tea.   Drink at least 1 glass of water with each meal and aim for at least 8 glasses per day  Exercise at least 150 minutes every week.

## 2022-05-30 NOTE — Assessment & Plan Note (Signed)
Regular rate and rhythm today.  Continue management per cardiology.

## 2022-06-04 NOTE — Progress Notes (Signed)
Remote ICD transmission.   

## 2022-06-26 ENCOUNTER — Encounter: Payer: Self-pay | Admitting: Family Medicine

## 2022-06-27 ENCOUNTER — Other Ambulatory Visit: Payer: Self-pay

## 2022-06-27 DIAGNOSIS — F988 Other specified behavioral and emotional disorders with onset usually occurring in childhood and adolescence: Secondary | ICD-10-CM

## 2022-06-27 MED ORDER — LISDEXAMFETAMINE DIMESYLATE 50 MG PO CAPS: 50.0000 mg | ORAL_CAPSULE | Freq: Every day | ORAL | 0 refills | Status: DC

## 2022-06-27 NOTE — Telephone Encounter (Signed)
Last refill: 05/26/22 #30, 0 Last OV: 05/30/22 dx. ADHD

## 2022-07-10 ENCOUNTER — Encounter: Payer: Self-pay | Admitting: Internal Medicine

## 2022-07-10 ENCOUNTER — Ambulatory Visit: Payer: Medicare Other | Attending: Internal Medicine | Admitting: Internal Medicine

## 2022-07-10 VITALS — BP 120/84 | HR 77 | Ht 66.0 in | Wt 231.2 lb

## 2022-07-10 DIAGNOSIS — Z95810 Presence of automatic (implantable) cardiac defibrillator: Secondary | ICD-10-CM | POA: Diagnosis not present

## 2022-07-10 DIAGNOSIS — I420 Dilated cardiomyopathy: Secondary | ICD-10-CM

## 2022-07-10 DIAGNOSIS — I447 Left bundle-branch block, unspecified: Secondary | ICD-10-CM

## 2022-07-10 DIAGNOSIS — I509 Heart failure, unspecified: Secondary | ICD-10-CM | POA: Insufficient documentation

## 2022-07-10 DIAGNOSIS — I5032 Chronic diastolic (congestive) heart failure: Secondary | ICD-10-CM

## 2022-07-10 NOTE — Patient Instructions (Signed)
Medication Instructions:  Your physician has recommended you make the following change in your medication:   **Stop Carvedilol  *If you need a refill on your cardiac medications before your next appointment, please call your pharmacy*   Lab Work: BMET today If you have labs (blood work) drawn today and your tests are completely normal, you will receive your results only by: [place] (if you have MyChart) OR A paper copy in the mail If you have any lab test that is abnormal or we need to change your treatment, we will call you to review the results.   Testing/Procedures: None ordered.    Follow-Up: At [facility], you and your health needs are our priority.  As part of our continuing mission to provide you with exceptional heart care, we have created designated Provider Care Teams.  These Care Teams include your primary Cardiologist (physician) and Advanced Practice Providers (APPs -  Physician Assistants and Nurse Practitioners) who all work together to provide you with the care you need, when you need it.  We recommend signing up for the patient portal called "MyChart".  Sign up information is provided on this After Visit Summary.  MyChart is used to connect with patients for Virtual Visits (Telemedicine).  Patients are able to view lab/test results, encounter notes, upcoming appointments, etc.  Non-urgent messages can be sent to your provider as well.   To learn more about what you can do with MyChart, go to [email or web address].    Your next appointment:   12 months with Dr Caryl Comes  Important Information About Sugar

## 2022-07-10 NOTE — Progress Notes (Addendum)
Patient Care Team: Vivi Barrack, MD as PCP - General (Family Medicine) Dorothy Spark, MD as PCP - Cardiology (Cardiology) Deboraha Sprang, MD as PCP - Electrophysiology (Cardiology) Madelin Rear, [facility] (Inactive) as Pharmacist (Pharmacist)   HPI  Debra Holloway is a 66 y.o. female Seen in follow-up for CRT-D-Medtronic implanted 11/16 for left bundle branch block with nonischemic cardiomyopathy with interval normalization.  The patient denies chest pain, nocturnal dyspnea, orthopnea or peripheral edema.  There have been no palpitations, lightheadedness or syncope.  Complains of shortness of breath and fatigue, no associated shortness of breath.     Some LH with standing   Pt sent MyChart message-- Chest pain--week before visit, "something inside of her chest" with difficulty breathing "grabbing" took an ASA, abated after 1 hr--happened one year ago.   Sleeping on 2 pillow--feels like needs she may need to go to 3 pillows    DATE TEST EF   4/16 cMRI  25-30 %   4/16 LHC  Normal coronaries  4/17 Echo   40-45 %   8/18 Echo  50-55%   3/20 Echo  55-60%   7/21 Echo  50-55%    Date Cr K Hgb  10/18 0.92 4.2    4/21 1.02 4.2   10/22 0.88 4.4 13.5  10/23 0.89 4.3 13.2      Past Medical History:  Diagnosis Date   ADD (attention deficit disorder)    AICD (automatic cardioverter/defibrillator) present 08-31-2015   Anxiety    "just when my daddy died a few years ago" (Aug 31, 2015)   AR (allergic rhinitis)    Chicken pox    Chronic combined systolic and diastolic CHF (congestive heart failure) ([place])    Depression    "just when my daddy died a few years ago" (08/31/15)   Dyslipidemia    Environmental allergies    H/O vitamin D deficiency    LBBB (left bundle branch block)    W/1st degree AV Block, Avoid Beta Blockers   NICM (nonischemic cardiomyopathy) (HCC)    OSA (obstructive sleep apnea) 03/18/2015   Severe OSA with AHI 30/hr; "suppose to wear mask but don't"  (2015/08/31)   Seasonal affective disorder ([place])     Past Surgical History:  Procedure Laterality Date   CARDIAC CATHETERIZATION     DILATION AND CURETTAGE OF UTERUS  2003   EP IMPLANTABLE DEVICE N/A 08/31/15   Procedure: BiV ICD Insertion CRT-D;  Surgeon: Deboraha Sprang, MD;  Location: [place] [facility];  Service: Cardiovascular;  Laterality: N/A;   LEFT AND RIGHT HEART CATHETERIZATION WITH CORONARY ANGIOGRAM N/A 11/17/2014   Procedure: LEFT AND RIGHT HEART CATHETERIZATION WITH CORONARY ANGIOGRAM;  Surgeon: Troy Sine, MD;  [facility] C[facility] LAB;  Service: Cardiovascular;  Laterality: N/A;   TOTAL ABDOMINAL HYSTERECTOMY  2004   TUBAL LIGATION     WISDOM TOOTH EXTRACTION  1986    Current Outpatient Medications  Medication Sig Dispense Refill   b complex vitamins tablet Take 1 tablet by mouth daily.     buPROPion (WELLBUTRIN XL) 300 MG 24 hr tablet Take 1 tablet (300 mg total) by mouth daily. 90 tablet 1   carvedilol (COREG) 6.25 MG tablet Take 1 tablet (6.25 mg total) by mouth 2 (two) times daily. (Patient taking differently: 2 capsules daily) 180 tablet 0   Cholecalciferol (VITAMIN D PO) Take 2,000 mg by mouth daily.      fexofenadine (ALLEGRA) 180 MG tablet Take 180 mg by  mouth daily.     fluticasone (FLONASE) 50 MCG/ACT nasal spray Place 1 spray into both nostrils daily.     furosemide (LASIX) 40 MG tablet Take 1 tablet (40 mg total) by mouth as needed. 90 tablet 0   lisdexamfetamine (VYVANSE) 50 MG capsule Take 1 capsule (50 mg total) by mouth daily. 30 capsule 0   lisinopril (ZESTRIL) 5 MG tablet Take 1 tablet (5 mg total) by mouth daily. 90 tablet 0   mirabegron ER (MYRBETRIQ) 50 MG TB24 tablet Take 1 tablet (50 mg total) by mouth daily. 90 tablet 1   potassium chloride (KLOR-CON) 10 MEQ tablet Take 1 tablet (10 mEq total) by mouth as needed (when take furosemide). 90 tablet 0   spironolactone (ALDACTONE) 25 MG tablet Take 0.5 tablets (12.5 mg total) by mouth daily. (Patient  taking differently: Take 25 mg by mouth daily.) 45 tablet 0   No current facility-administered medications for this visit.    Allergies  Allergen Reactions   Bee Venom Anaphylaxis   Losartan Other (See Comments)    Pt reports cough and insomnia.   Erythromycin Itching and Rash   Lovastatin Other (See Comments)    weakness   Penicillins Hives, Itching and Rash   Simvastatin Other (See Comments)    weakness      Review of Systems negative except from HPI and PMH  Physical Exam BP 120/84   Pulse 77   Ht '5\' 6"'$  (1.676 m)   Wt 231 lb 3.2 oz (104.9 kg)   SpO2 98%   BMI 37.32 kg/m  Well developed and well nourished in no acute distress HENT normal Neck supple with JVP-flat Clear Device pocket well healed; without hematoma or erythema.  There is no tethering  Regular rate and rhythm, no  gallop No  murmur Abd-soft with active BS No Clubbing cyanosis  edema Skin-warm and dry A & Oriented  Grossly normal sensory and motor function  ECG sinus with P-synchronous/ AV  pacing Neg QRS lead 1 and upright V1   Assessment and  Plan  Nonischemic cardiomyopathy  Left bundle branch block  SCAF   CRT-ICD Medtronic     Congestive heart failure-chronic diastolic  Dyspnea on exertion  Anxiety /depression  Obesity  Fatigue   The patient is euvolemic.  We will continue her furosemide 40 mg a day as needed and her spironolactone 25 mg daily.  She needs a metabolic profile.  Her fatigue is pretty overwhelming; I wonder if some part of it is related to carvedilol.  With interval normalization of LV function, we will discontinue her carvedilol for now and see how she does.  If it improves, we consider alternatives with either bisoprolol or metoprolol.  I encouraged her as she meets with her new PCP tomorrow to consider repeat sleep study as she is really struggles I also discussed with her the potential benefits of melatonin and low dosing.  Her depression is problematic,  bringing her to tears a couple of times today , I suggested that she discuss this with her PCP tomorrow as to whether alternative medications might be more appropriate than what she is currently taking  I also mentioned to her restoration place, in the past, she has been part of the church community and suggested that might be also a place to reconnect with people.  SCAF, no indication of anticoagulation   ADDENDUM recommendations 1) 2 d Echo 2) furosemide 80 qd x 3days 3) CTA coronary

## 2022-07-11 ENCOUNTER — Encounter: Payer: Self-pay | Admitting: Family Medicine

## 2022-07-11 ENCOUNTER — Ambulatory Visit (INDEPENDENT_AMBULATORY_CARE_PROVIDER_SITE_OTHER): Payer: Medicare Other | Admitting: Family Medicine

## 2022-07-11 VITALS — BP 126/75 | HR 70 | Temp 98.2°F | Ht 65.5 in | Wt 230.4 lb

## 2022-07-11 DIAGNOSIS — E785 Hyperlipidemia, unspecified: Secondary | ICD-10-CM | POA: Diagnosis not present

## 2022-07-11 DIAGNOSIS — R7989 Other specified abnormal findings of blood chemistry: Secondary | ICD-10-CM | POA: Diagnosis not present

## 2022-07-11 DIAGNOSIS — F909 Attention-deficit hyperactivity disorder, unspecified type: Secondary | ICD-10-CM

## 2022-07-11 DIAGNOSIS — F419 Anxiety disorder, unspecified: Secondary | ICD-10-CM

## 2022-07-11 DIAGNOSIS — F32A Depression, unspecified: Secondary | ICD-10-CM | POA: Diagnosis not present

## 2022-07-11 DIAGNOSIS — Z0001 Encounter for general adult medical examination with abnormal findings: Secondary | ICD-10-CM | POA: Diagnosis not present

## 2022-07-11 DIAGNOSIS — B079 Viral wart, unspecified: Secondary | ICD-10-CM

## 2022-07-11 DIAGNOSIS — R739 Hyperglycemia, unspecified: Secondary | ICD-10-CM | POA: Diagnosis not present

## 2022-07-11 DIAGNOSIS — G4700 Insomnia, unspecified: Secondary | ICD-10-CM

## 2022-07-11 LAB — COMPREHENSIVE METABOLIC PANEL
ALT: 38 U/L — ABNORMAL HIGH (ref 0–35)
AST: 31 U/L (ref 0–37)
Albumin: 4.2 g/dL (ref 3.5–5.2)
Alkaline Phosphatase: 69 U/L (ref 39–117)
BUN: 17 mg/dL (ref 6–23)
CO2: 27 mEq/L (ref 19–32)
Calcium: 9.8 mg/dL (ref 8.4–10.5)
Chloride: 104 mEq/L (ref 96–112)
Creatinine, Ser: 0.89 mg/dL (ref 0.40–1.20)
GFR: 68.34 mL/min (ref >60.00)
Glucose, Bld: 103 mg/dL — ABNORMAL HIGH (ref 70–99)
Potassium: 4.3 mEq/L (ref 3.5–5.1)
Sodium: 140 mEq/L (ref 135–145)
Total Bilirubin: 0.5 mg/dL (ref 0.2–1.2)
Total Protein: 7.6 g/dL (ref 6.0–8.3)

## 2022-07-11 LAB — BASIC METABOLIC PANEL
BUN/Creatinine Ratio: 17 (ref 12–28)
BUN: 16 mg/dL (ref 8–27)
CO2: 22 mmol/L (ref 20–29)
Calcium: 9.5 mg/dL (ref 8.7–10.3)
Chloride: 107 mmol/L — ABNORMAL HIGH (ref 96–106)
Creatinine, Ser: 0.93 mg/dL (ref 0.57–1.00)
Glucose: 94 mg/dL (ref 70–99)
Potassium: 4.7 mmol/L (ref 3.5–5.2)
Sodium: 140 mmol/L (ref 134–144)
eGFR: 69 mL/min/{1.73_m2} (ref >59)

## 2022-07-11 LAB — CBC
HCT: 39.6 % (ref 36.0–46.0)
Hemoglobin: 13.2 g/dL (ref 12.0–15.0)
MCHC: 33.4 g/dL (ref 30.0–36.0)
MCV: 90.6 fl (ref 78.0–100.0)
Platelets: 248 10*3/uL (ref 150.0–400.0)
RBC: 4.37 Mil/uL (ref 3.87–5.11)
RDW: 13.8 % (ref 11.5–15.5)
WBC: 7.2 10*3/uL (ref 4.0–10.5)

## 2022-07-11 LAB — LIPID PANEL
Cholesterol: 227 mg/dL — ABNORMAL HIGH (ref 0–200)
HDL: 70.4 mg/dL (ref >39.00)
LDL Cholesterol: 136 mg/dL — ABNORMAL HIGH (ref 0–99)
NonHDL: 156.96
Total CHOL/HDL Ratio: 3
Triglycerides: 106 mg/dL (ref 0.0–149.0)
VLDL: 21.2 mg/dL (ref 0.0–40.0)

## 2022-07-11 LAB — VITAMIN D 25 HYDROXY (VIT D DEFICIENCY, FRACTURES): VITD: 24.28 ng/mL — ABNORMAL LOW (ref 30.00–100.00)

## 2022-07-11 LAB — HEMOGLOBIN A1C: Hgb A1c MFr Bld: 5.9 % (ref 4.6–6.5)

## 2022-07-11 LAB — TSH: TSH: 3.5 u[IU]/mL (ref 0.35–5.50)

## 2022-07-11 NOTE — Assessment & Plan Note (Signed)
Elevated PHq9 score today. No SI or HI today. She is on wellbutrin '300mg'$  daily and tolerating well. She feels like she is managing her symptoms reasonably well. We discussed medication changes and referral to see a therapist however she declined.   She has tried multiple SSRIs in the past which did not work well for her.

## 2022-07-11 NOTE — Assessment & Plan Note (Addendum)
Not controlled. We discussed treatment options. She will try melatonin '10mg'$  daily. She has tried [place] in the past but did not tolerate due to side effects. May consider trial of prescription medication if melatonin is not helpful.

## 2022-07-11 NOTE — Patient Instructions (Signed)
It was very nice to see you today!  We froze your wart today.  Come back in 2 to 3 weeks if need to freeze this again  We will check blood work.  I will see you back in 6 months for medication recheck.  Take care, Dr Jerline Pain  PLEASE NOTE:  If you had any lab tests please let us know if you have not heard back within a few days. You may see your results on mychart before we have a chance to review them but we will give you a call once they are reviewed by [place]. If we ordered any referrals today, please let us know if you have not heard from their office within the next week.   Please try these tips to maintain a healthy lifestyle:  Eat at least 3 REAL meals and 1-2 snacks per day.  Aim for no more than 5 hours between eating.  If you eat breakfast, please do so within one hour of getting up.   Each meal should contain half fruits/vegetables, one quarter protein, and one quarter carbs (no bigger than a computer mouse)  Cut down on sweet beverages. This includes juice, soda, and sweet tea.   Drink at least 1 glass of water with each meal and aim for at least 8 glasses per day  Exercise at least 150 minutes every week.    Preventive Care 65-66 Years Old, Female Preventive care refers to lifestyle choices and visits with your health care provider that can promote health and wellness. Preventive care visits are also called wellness exams. What can I expect for my preventive care visit? Counseling Your health care provider may ask you questions about your: Medical history, including: Past medical problems. Family medical history. Pregnancy history. Current health, including: Menstrual cycle. Method of birth control. Emotional well-being. Home life and relationship well-being. Sexual activity and sexual health. Lifestyle, including: Alcohol, nicotine or tobacco, and drug use. Access to firearms. Diet, exercise, and sleep habits. Work and work Statistician. Sunscreen use. Safety  issues such as seatbelt and bike helmet use. Physical exam Your health care provider will check your: Height and weight. These may be used to calculate your BMI (body mass index). BMI is a measurement that tells if you are at a healthy weight. Waist circumference. This measures the distance around your waistline. This measurement also tells if you are at a healthy weight and may help predict your risk of certain diseases, such as type 2 diabetes and high blood pressure. Heart rate and blood pressure. Body temperature. Skin for abnormal spots. What immunizations do I need?  Vaccines are usually given at various ages, according to a schedule. Your health care provider will recommend vaccines for you based on your age, medical history, and lifestyle or other factors, such as travel or where you work. What tests do I need? Screening Your health care provider may recommend screening tests for certain conditions. This may include: Lipid and cholesterol levels. Diabetes screening. This is done by checking your blood sugar (glucose) after you have not eaten for a while (fasting). Pelvic exam and Pap test. Hepatitis B test. Hepatitis C test. HIV (human immunodeficiency virus) test. STI (sexually transmitted infection) testing, if you are at risk. Lung cancer screening. Colorectal cancer screening. Mammogram. Talk with your health care provider about when you should start having regular mammograms. This may depend on whether you have a family history of breast cancer. BRCA-related cancer screening. This may be done if you have a  family history of breast, ovarian, tubal, or peritoneal cancers. Bone density scan. This is done to screen for osteoporosis. Talk with your health care provider about your test results, treatment options, and if necessary, the need for more tests. Follow these instructions at home: Eating and drinking  Eat a diet that includes fresh fruits and vegetables, whole grains,  lean protein, and low-fat dairy products. Take vitamin and mineral supplements as recommended by your health care provider. Do not drink alcohol if: Your health care provider tells you not to drink. You are pregnant, may be pregnant, or are planning to become pregnant. If you drink alcohol: Limit how much you have to 0-1 drink a day. Know how much alcohol is in your drink. In the [place] one drink equals one 12 oz bottle of beer (355 mL), one 5 oz glass of wine (148 mL), or one 1 oz glass of hard liquor (44 mL). Lifestyle Brush your teeth every morning and night with fluoride toothpaste. Floss one time each day. Exercise for at least 30 minutes 5 or more days each week. Do not use any products that contain nicotine or tobacco. These products include cigarettes, chewing tobacco, and vaping devices, such as e-cigarettes. If you need help quitting, ask your health care provider. Do not use drugs. If you are sexually active, practice safe sex. Use a condom or other form of protection to prevent STIs. If you do not wish to become pregnant, use a form of birth control. If you plan to become pregnant, see your health care provider for a prepregnancy visit. Take aspirin only as told by your health care provider. Make sure that you understand how much to take and what form to take. Work with your health care provider to find out whether it is safe and beneficial for you to take aspirin daily. Find healthy ways to manage stress, such as: Meditation, yoga, or listening to music. Journaling. Talking to a trusted person. Spending time with friends and family. Minimize exposure to UV radiation to reduce your risk of skin cancer. Safety Always wear your seat belt while driving or riding in a vehicle. Do not drive: If you have been drinking alcohol. Do not ride with someone who has been drinking. When you are tired or distracted. While texting. If you have been using any mind-altering substances or  drugs. Wear a helmet and other protective equipment during sports activities. If you have firearms in your house, make sure you follow all gun safety procedures. Seek help if you have been physically or sexually abused. What's next? Visit your health care provider once a year for an annual wellness visit. Ask your health care provider how often you should have your eyes and teeth checked. Stay up to date on all vaccines. This information is not intended to replace advice given to you by your health care provider. Make sure you discuss any questions you have with your health care provider. Document Revised: 03/16/2021 Document Reviewed: 03/16/2021 Elsevier Patient Education  [place].

## 2022-07-11 NOTE — Assessment & Plan Note (Signed)
Check lipids 

## 2022-07-11 NOTE — Assessment & Plan Note (Signed)
Stable on vyvanse '50mg'$  daily. Medications help with ability to stay focused and on task. No side effects. Does not need refill today. Follow up in 6 months.

## 2022-07-11 NOTE — Progress Notes (Signed)
Chief Complaint:  Debra Holloway is a 66 y.o. female who presents today for her annual comprehensive physical exam.    Assessment/Plan:  New/Acute Problems: Cutaneous Wart Cryotherapy applied.  See below procedure note.  Chronic Problems Addressed Today: Dyslipidemia Check lipids.   ADHD Stable on vyvanse '50mg'$  daily. Medications help with ability to stay focused and on task. No side effects. Does not need refill today. Follow up in 6 months.   Low vitamin D level Check vitamin D.   Insomnia Not controlled. We discussed treatment options. She will try melatonin '10mg'$  daily. She has tried [place] in the past but did not tolerate due to side effects. May consider trial of prescription medication if melatonin is not helpful.   Anxiety and depression Elevated PHq9 score today. No SI or HI today. She is on wellbutrin '300mg'$  daily and tolerating well. She feels like she is managing her symptoms reasonably well. We discussed medication changes and referral to see a therapist however she declined.   She has tried multiple SSRIs in the past which did not work well for her.   Preventative Healthcare: Check labs.  Deferred cancer screening.  Patient Counseling(The following topics were reviewed and/or handout was given):  -Nutrition: Stressed importance of moderation in sodium/caffeine intake, saturated fat and cholesterol, caloric balance, sufficient intake of fresh fruits, vegetables, and fiber.  -Stressed the importance of regular exercise.   -Substance Abuse: Discussed cessation/primary prevention of tobacco, alcohol, or other drug use; driving or other dangerous activities under the influence; availability of treatment for abuse.   -Injury prevention: Discussed safety belts, safety helmets, smoke detector, smoking near bedding or upholstery.   -Sexuality: Discussed sexually transmitted diseases, partner selection, use of condoms, avoidance of unintended pregnancy and contraceptive  alternatives.   -Dental health: Discussed importance of regular tooth brushing, flossing, and dental visits.  -Health maintenance and immunizations reviewed. Please refer to Health maintenance section.  Return to care in 1 year for next preventative visit.     Subjective:  HPI:  She has no acute complaints today. See A/p for status of chronic conditions.   Lifestyle Diet: None specific.  Exercise: None specific.      05/30/2022    8:06 AM  Depression screen PHQ 2/9  Decreased Interest 1  Down, Depressed, Hopeless 2  PHQ - 2 Score 3  Altered sleeping 3  Tired, decreased energy 3  Change in appetite 3  Feeling bad or failure about yourself  1  Trouble concentrating 3  Moving slowly or fidgety/restless 0  Suicidal thoughts 0  PHQ-9 Score 16  Difficult doing work/chores Somewhat difficult     ROS: Per HPI, otherwise a complete review of systems was negative.   PMH:  The following were reviewed and entered/updated in epic: Past Medical History:  Diagnosis Date   ADD (attention deficit disorder)    AICD (automatic cardioverter/defibrillator) present Aug 29, 2015   Anxiety    "just when my daddy died a few years ago" (2015-08-29)   AR (allergic rhinitis)    Chicken pox    Chronic combined systolic and diastolic CHF (congestive heart failure) ([place])    Depression    "just when my daddy died a few years ago" (2015/08/29)   Dyslipidemia    Environmental allergies    H/O vitamin D deficiency    LBBB (left bundle branch block)    W/1st degree AV Block, Avoid Beta Blockers   NICM (nonischemic cardiomyopathy) ([facility])    OSA (obstructive sleep apnea) 03/18/2015  Severe OSA with AHI 30/hr; "suppose to wear mask but don't" (08/16/2015)   Seasonal affective disorder [facility])    Patient Active Problem List   Diagnosis Date Noted   Insomnia 07/11/2022   Low vitamin D level 07/11/2022   CHF (congestive heart failure) ([place]) 07/10/2022   Overactive bladder 05/30/2022   PAF  (paroxysmal atrial fibrillation) ([place]) 07/06/2021   ICD (implantable cardioverter-defibrillator) in place 04/19/2020   Anxiety and depression 09/21/2016   OSA (obstructive sleep apnea) 45/80/9983   Chronic systolic heart failure ([place]) 12/07/2014   Nonischemic dilated cardiomyopathy ([place])    LBBB (left bundle branch block)    Dyslipidemia    Hot flashes, menopausal 09/18/2014   ADHD 09/06/2013   Environmental allergies 09/06/2013   Past Surgical History:  Procedure Laterality Date   CARDIAC CATHETERIZATION     DILATION AND CURETTAGE OF UTERUS  2003   EP IMPLANTABLE DEVICE N/A 08/16/2015   Procedure: BiV ICD Insertion CRT-D;  Surgeon: Deboraha Sprang, MD;  Location: [facility];  Service: Cardiovascular;  Laterality: N/A;   LEFT AND RIGHT HEART CATHETERIZATION WITH CORONARY ANGIOGRAM N/A 11/17/2014   Procedure: LEFT AND RIGHT HEART CATHETERIZATION WITH CORONARY ANGIOGRAM;  Surgeon: Troy Sine, MD;  Location: [facility] CATH LAB;  Service: Cardiovascular;  Laterality: N/A;   TOTAL ABDOMINAL HYSTERECTOMY  2004   TUBAL LIGATION     WISDOM TOOTH EXTRACTION  1986    Family History  Problem Relation Age of Onset   COPD Mother        Living   Pancreatic cancer Father 26       Deceased   Lung cancer Paternal Grandfather    Heart disease Paternal Grandmother    Hypertension Paternal Grandmother    Alzheimer's disease Maternal Grandfather    Arthritis/Rheumatoid Maternal Grandmother    Heart disease Maternal Grandmother        Tachycardia   Cancer Paternal Uncle    Healthy Brother        x3   Healthy Daughter        x3   Breast cancer Sister 34    Medications- reviewed and updated Current Outpatient Medications  Medication Sig Dispense Refill   b complex vitamins tablet Take 1 tablet by mouth daily.     buPROPion (WELLBUTRIN XL) 300 MG 24 hr tablet Take 1 tablet (300 mg total) by mouth daily. 90 tablet 1   Cholecalciferol (VITAMIN D PO) Take 2,000 mg by mouth daily.       fexofenadine (ALLEGRA) 180 MG tablet Take 180 mg by mouth daily.     fluticasone (FLONASE) 50 MCG/ACT nasal spray Place 1 spray into both nostrils daily.     furosemide (LASIX) 40 MG tablet Take 1 tablet (40 mg total) by mouth as needed. 90 tablet 0   lisdexamfetamine (VYVANSE) 50 MG capsule Take 1 capsule (50 mg total) by mouth daily. 30 capsule 0   lisinopril (ZESTRIL) 5 MG tablet Take 1 tablet (5 mg total) by mouth daily. 90 tablet 0   mirabegron ER (MYRBETRIQ) 50 MG TB24 tablet Take 1 tablet (50 mg total) by mouth daily. 90 tablet 1   potassium chloride (KLOR-CON) 10 MEQ tablet Take 1 tablet (10 mEq total) by mouth as needed (when take furosemide). 90 tablet 0   spironolactone (ALDACTONE) 25 MG tablet Take 0.5 tablets (12.5 mg total) by mouth daily. (Patient taking differently: Take 25 mg by mouth daily.) 45 tablet 0   No current facility-administered medications for this visit.  Allergies-reviewed and updated Allergies  Allergen Reactions   Bee Venom Anaphylaxis   Losartan Other (See Comments)    Pt reports cough and insomnia.   Erythromycin Itching and Rash   Lovastatin Other (See Comments)    weakness   Penicillins Hives, Itching and Rash   Simvastatin Other (See Comments)    weakness    Social History   Socioeconomic History   Marital status: Divorced    Spouse name: Not on file   Number of children: Not on file   Years of education: Not on file   Highest education level: Not on file  Occupational History   Not on file  Tobacco Use   Smoking status: Never   Smokeless tobacco: Never  Vaping Use   Vaping Use: Never used  Substance and Sexual Activity   Alcohol use: Yes    Alcohol/week: 4.0 standard drinks of alcohol    Types: 4 Glasses of wine per week   Drug use: No   Sexual activity: Not Currently  Other Topics Concern   Not on file  Social History Narrative   Not on file   Social Determinants of Health   Financial Resource Strain: Low Risk  (04/20/2020)    Overall Financial Resource Strain (CARDIA)    Difficulty of Paying Living Expenses: Not hard at all  Food Insecurity: No Food Insecurity (04/20/2020)   Hunger Vital Sign    Worried About Running Out of Food in the Last Year: Never true    Ran Out of Food in the Last Year: Never true  Transportation Needs: No Transportation Needs (04/20/2020)   PRAPARE - Hydrologist (Medical): No    Lack of Transportation (Non-Medical): No  Physical Activity: Inactive (04/20/2020)   Exercise Vital Sign    Days of Exercise per Week: 0 days    Minutes of Exercise per Session: 0 min  Stress: No Stress Concern Present (04/20/2020)   [place]    Feeling of Stress : Not at all  Social Connections: Socially Isolated (04/20/2020)   Social Connection and Isolation Panel [NHANES]    Frequency of Communication with Friends and Family: More than three times a week    Frequency of Social Gatherings with Friends and Family: Once a week    Attends Religious Services: Never    Marine scientist or Organizations: No    Attends Music therapist: Never    Marital Status: Divorced        Objective:  Physical Exam: BP 126/75   Pulse 70   Temp 98.2 F (36.8 C) (Temporal)   Ht 5' 5.5" (1.664 m)   Wt 230 lb 6.4 oz (104.5 kg)   SpO2 98%   BMI 37.76 kg/m   Body mass index is 37.76 kg/m. Wt Readings from Last 3 Encounters:  07/11/22 230 lb 6.4 oz (104.5 kg)  07/10/22 231 lb 3.2 oz (104.9 kg)  05/30/22 227 lb 9.6 oz (103.2 kg)   Gen: NAD, resting comfortably HEENT: TMs normal bilaterally. OP clear. No thyromegaly noted.  CV: RRR with no murmurs appreciated Pulm: NWOB, CTAB with no crackles, wheezes, or rhonchi GI: Normal bowel sounds present. Soft, Nontender, Nondistended. MSK: no edema, cyanosis, or clubbing noted Skin: warm, dry.  Cutaneous wart noted on left ring finger. Neuro: CN2-12 grossly  intact. Strength 5/5 in upper and lower extremities. Reflexes symmetric and intact bilaterally.  Psych: Normal affect and thought content  Cryotherapy Procedure Note  Pre-operative Diagnosis: Cutaneous Wart  Locations: Left fourth digit  Indications: Therapeutic  Procedure Details  Patient informed of risks (permanent scarring, infection, light or dark discoloration, bleeding, infection, weakness, numbness and recurrence of the lesion) and benefits of the procedure and verbal informed consent obtained.  The areas are treated with liquid nitrogen therapy, frozen until ice ball extended 2 mm beyond lesion, allowed to thaw, and treated again. The patient tolerated procedure well.  The patient was instructed on post-op care, warned that there may be blister formation, redness and pain. Recommend OTC analgesia as needed for pain.  Condition: Stable  Complications: none.      Algis Greenhouse. Jerline Pain, MD 07/11/2022 8:13 AM

## 2022-07-11 NOTE — Assessment & Plan Note (Signed)
Check vitamin D. 

## 2022-07-13 ENCOUNTER — Telehealth: Payer: Self-pay | Admitting: Family Medicine

## 2022-07-13 NOTE — Telephone Encounter (Signed)
Copied from [place] [id number]. Topic: Medicare AWV >> Jul 13, 2022 10:10 AM Devoria Glassing wrote: Reason for CRM: Left message for patient to schedule Annual Wellness Visit.  Please schedule with Nurse Health Advisor Charlott Rakes, RN at [facility]. This appt can be telephone or office visit. Please call [phone] ask for [facility]

## 2022-07-14 ENCOUNTER — Telehealth: Payer: Self-pay

## 2022-07-14 DIAGNOSIS — R079 Chest pain, unspecified: Secondary | ICD-10-CM

## 2022-07-14 DIAGNOSIS — R0600 Dyspnea, unspecified: Secondary | ICD-10-CM

## 2022-07-14 DIAGNOSIS — R072 Precordial pain: Secondary | ICD-10-CM

## 2022-07-14 MED ORDER — METOPROLOL TARTRATE 100 MG PO TABS: ORAL_TABLET | ORAL | 0 refills | Status: DC

## 2022-07-14 NOTE — Progress Notes (Signed)
Please inform patient of the following:  Vitamin D is low.  She should take 1000 to 2000 IUs vitamin D daily and we can recheck in 3 to 6 months.  Cholesterol is borderline elevated but a little bit better than last year.  Blood sugar is also borderline.  She should continue to work on diet and exercise and we can recheck these in a year.

## 2022-07-14 NOTE — Telephone Encounter (Signed)
Spoke with pt and advised orders placed per Dr Caryl Comes for echo and cardiac CT. Instructions placed on pt's MyChart.  Pt advised per Dr Caryl Comes to increase Furosemide '40mg'$  to 2 tablets ('80mg'$ ) by mouth daily x 3 days.  Metoprolol '100mg'$  - 1 tablet by mouth 2 hours prior to procedure sent to pharmacy on file.  Pt verbalizes understanding and agrees with current plan.

## 2022-07-26 ENCOUNTER — Telehealth (HOSPITAL_COMMUNITY): Payer: Self-pay | Admitting: *Deleted

## 2022-07-26 NOTE — Telephone Encounter (Signed)
Patient returning call regarding upcoming cardiac imaging study; pt verbalizes understanding of appt date/time, parking situation and where to check in, medications ordered, and verified current allergies; name and call back number provided for further questions should they arise  Arwa Yero RN Navigator Cardiac Imaging [facility] [phone] office [phone] cell  Patient to take 100mg metoprolol tartrate two hours prior to her cardiac CT scan.  She is aware to arrive at 12pm. 

## 2022-07-26 NOTE — Telephone Encounter (Signed)
Attempted to call patient regarding upcoming cardiac CT appointment. °Left message on voicemail with name and callback number ° °Denali Becvar RN Navigator Cardiac Imaging °[facility] °[phone] Office °[phone] Cell ° °

## 2022-07-27 ENCOUNTER — Ambulatory Visit (HOSPITAL_COMMUNITY)
Admission: RE | Admit: 2022-07-27 | Discharge: 2022-07-27 | Disposition: A | Discharge status: Home / Self Care | Payer: Medicare Other | Source: Ambulatory Visit | Attending: Internal Medicine | Admitting: Internal Medicine

## 2022-07-27 ENCOUNTER — Ambulatory Visit (HOSPITAL_BASED_OUTPATIENT_CLINIC_OR_DEPARTMENT_OTHER): Payer: Medicare Other

## 2022-07-27 DIAGNOSIS — R079 Chest pain, unspecified: Secondary | ICD-10-CM | POA: Diagnosis not present

## 2022-07-27 DIAGNOSIS — R0600 Dyspnea, unspecified: Secondary | ICD-10-CM | POA: Diagnosis not present

## 2022-07-27 DIAGNOSIS — R072 Precordial pain: Secondary | ICD-10-CM | POA: Diagnosis not present

## 2022-07-27 LAB — ECHOCARDIOGRAM COMPLETE
Area-P 1/2: 3.63 cm2
S' Lateral: 3.1 cm

## 2022-07-27 MED ORDER — NITROGLYCERIN 0.4 MG SL SUBL
SUBLINGUAL_TABLET | SUBLINGUAL | Status: AC
  Filled 2022-07-27: qty 2

## 2022-07-27 MED ORDER — NITROGLYCERIN 0.4 MG SL SUBL
0.8000 mg | SUBLINGUAL_TABLET | Freq: Once | SUBLINGUAL | Status: AC
  Administered 2022-07-27: 0.8 mg via SUBLINGUAL

## 2022-07-27 MED ORDER — IOHEXOL 350 MG/ML SOLN
100.0000 mL | Freq: Once | INTRAVENOUS | Status: AC | PRN
  Administered 2022-07-27: 100 mL via INTRAVENOUS

## 2022-07-27 MED ORDER — PERFLUTREN LIPID MICROSPHERE
1.0000 mL | INTRAVENOUS | Status: AC | PRN
  Administered 2022-07-27: 3 mL via INTRAVENOUS
  Administered 2022-07-27: 1 mL via INTRAVENOUS

## 2022-07-30 ENCOUNTER — Encounter: Payer: Self-pay | Admitting: Family Medicine

## 2022-07-30 DIAGNOSIS — F988 Other specified behavioral and emotional disorders with onset usually occurring in childhood and adolescence: Secondary | ICD-10-CM

## 2022-07-31 ENCOUNTER — Encounter: Payer: Self-pay | Admitting: Internal Medicine

## 2022-07-31 ENCOUNTER — Ambulatory Visit: Payer: Medicare Other

## 2022-08-01 LAB — CUP PACEART REMOTE DEVICE CHECK
Battery Remaining Longevity: 5 mo
Battery Voltage: 2.82 V
Brady Statistic AP VP Percent: 0.19 %
Brady Statistic AP VS Percent: 0.02 %
Brady Statistic AS VP Percent: 97.92 %
Brady Statistic AS VS Percent: 1.87 %
Brady Statistic RA Percent Paced: 0.21 %
Brady Statistic RV Percent Paced: 97.66 %
Date Time Interrogation Session: 20231030002202
HighPow Impedance: 79 Ohm
Implantable Lead Connection Status: 753985
Implantable Lead Connection Status: 753985
Implantable Lead Connection Status: 753985
Implantable Lead Implant Date: 20161114
Implantable Lead Implant Date: 20161114
Implantable Lead Implant Date: 20161114
Implantable Lead Location: 753858
Implantable Lead Location: 753859
Implantable Lead Location: 753860
Implantable Lead Model: 4598
Implantable Lead Model: 5076
Implantable Pulse Generator Implant Date: 20161114
Lead Channel Impedance Value: 1007 Ohm
Lead Channel Impedance Value: 1026 Ohm
Lead Channel Impedance Value: 1026 Ohm
Lead Channel Impedance Value: 342 Ohm
Lead Channel Impedance Value: 456 Ohm
Lead Channel Impedance Value: 456 Ohm
Lead Channel Impedance Value: 475 Ohm
Lead Channel Impedance Value: 513 Ohm
Lead Channel Impedance Value: 513 Ohm
Lead Channel Impedance Value: 665 Ohm
Lead Channel Impedance Value: 703 Ohm
Lead Channel Impedance Value: 817 Ohm
Lead Channel Impedance Value: 836 Ohm
Lead Channel Pacing Threshold Amplitude: 0.625 V
Lead Channel Pacing Threshold Amplitude: 0.625 V
Lead Channel Pacing Threshold Amplitude: 1.25 V
Lead Channel Pacing Threshold Pulse Width: 0.4 ms
Lead Channel Pacing Threshold Pulse Width: 0.4 ms
Lead Channel Pacing Threshold Pulse Width: 0.4 ms
Lead Channel Sensing Intrinsic Amplitude: 1.75 mV
Lead Channel Sensing Intrinsic Amplitude: 1.75 mV
Lead Channel Sensing Intrinsic Amplitude: 22.875 mV
Lead Channel Sensing Intrinsic Amplitude: 22.875 mV
Lead Channel Setting Pacing Amplitude: 2 V
Lead Channel Setting Pacing Amplitude: 2.5 V
Lead Channel Setting Pacing Amplitude: 2.75 V
Lead Channel Setting Pacing Pulse Width: 0.4 ms
Lead Channel Setting Pacing Pulse Width: 0.4 ms
Lead Channel Setting Sensing Sensitivity: 0.45 mV
Zone Setting Status: 755011

## 2022-08-03 ENCOUNTER — Telehealth: Payer: Self-pay

## 2022-08-03 MED ORDER — FUROSEMIDE 40 MG PO TABS: 40.0000 mg | ORAL_TABLET | Freq: Every day | ORAL | 3 refills | Status: DC

## 2022-08-03 MED ORDER — POTASSIUM CHLORIDE ER 10 MEQ PO TBCR: 10.0000 meq | EXTENDED_RELEASE_TABLET | Freq: Every day | ORAL | 3 refills | Status: DC

## 2022-08-03 MED ORDER — LISDEXAMFETAMINE DIMESYLATE 50 MG PO CAPS: 50.0000 mg | ORAL_CAPSULE | Freq: Every day | ORAL | 0 refills | Status: DC

## 2022-08-03 MED ORDER — CARVEDILOL 6.25 MG PO TABS: 6.2500 mg | ORAL_TABLET | Freq: Two times a day (BID) | ORAL | 3 refills | Status: DC

## 2022-08-03 NOTE — Telephone Encounter (Signed)
Dr Caryl Comes has called pt to discuss echo and recommendations as below.  Pt's MAR updated.

## 2022-08-03 NOTE — Telephone Encounter (Signed)
-----   Message from Deboraha Sprang, MD sent at 07/30/2022  4:12 PM EDT ----- I HAVE CALLED spoken to patient Echo showed  mildly diminished and slightly worse heart muscle function  No improvement in fatigue with stopping carvedilol Will resume carvedilol at 6.25 bid And resume furosemide 40 taking it daily and KCL daily with it

## 2022-08-09 ENCOUNTER — Other Ambulatory Visit: Payer: Self-pay

## 2022-08-09 MED ORDER — POTASSIUM CHLORIDE ER 10 MEQ PO TBCR: 10.0000 meq | EXTENDED_RELEASE_TABLET | Freq: Every day | ORAL | 3 refills | Status: AC

## 2022-08-09 MED ORDER — FUROSEMIDE 40 MG PO TABS: 40.0000 mg | ORAL_TABLET | Freq: Every day | ORAL | 3 refills | Status: DC

## 2022-08-09 MED ORDER — SPIRONOLACTONE 25 MG PO TABS: 25.0000 mg | ORAL_TABLET | Freq: Every day | ORAL | 3 refills | Status: DC

## 2022-08-09 MED ORDER — LISINOPRIL 5 MG PO TABS: 5.0000 mg | ORAL_TABLET | Freq: Every day | ORAL | 3 refills | Status: DC

## 2022-08-09 MED ORDER — POTASSIUM CHLORIDE ER 10 MEQ PO TBCR: 10.0000 meq | EXTENDED_RELEASE_TABLET | Freq: Every day | ORAL | 3 refills | Status: DC

## 2022-08-17 ENCOUNTER — Telehealth (INDEPENDENT_AMBULATORY_CARE_PROVIDER_SITE_OTHER): Payer: Medicare Other | Admitting: Family Medicine

## 2022-08-17 VITALS — Temp 97.8°F

## 2022-08-17 DIAGNOSIS — U071 COVID-19: Secondary | ICD-10-CM

## 2022-08-17 MED ORDER — MOLNUPIRAVIR EUA 200MG CAPSULE: 4.0000 | ORAL_CAPSULE | Freq: Two times a day (BID) | ORAL | 0 refills | Status: AC

## 2022-08-17 NOTE — Patient Instructions (Signed)
HOME CARE TIPS:   -I sent the medication(s) we discussed to your pharmacy: Meds ordered this encounter  Medications   molnupiravir EUA (LAGEVRIO) 200 mg CAPS capsule    Sig: Take 4 capsules (800 mg total) by mouth 2 (two) times daily for 5 days.    Dispense:  40 capsule    Refill:  0     -I sent in the [place] treatment or referral you requested per our discussion. Please see the information provided below and discuss further with the pharmacist/treatment team.    -there is a chance of rebound illness with covid after improving. This can happen whether or not you take an antiviral treatment. If you become sick again with covid after getting better, please schedule a follow up virtual visit and isolate again.  -can use tylenol if needed for fevers, aches and pains per instructions  -nasal saline sinus rinses twice daily  -stay hydrated, drink plenty of fluids and eat small healthy meals - avoid dairy  -can take 1000 IU (74mg) Vit D3 and 100-500 mg of Vit C daily per instructions  -follow up with your doctor in 2-3 days unless improving and feeling better  -stay home while sick, except to seek medical care. If you have COVID19, you will likely be contagious for 7-10 days. Flu or Influenza is likely contagious for about 7 days. Other respiratory viral infections remain contagious for 5-10+ days depending on the virus and many other factors. Wear a good mask that fits snugly (such as N95 or KN95) if around others to reduce the risk of transmission.  It was nice to meet you today, and I really hope you are feeling better soon. I help [place] out with telemedicine visits on Tuesdays and Thursdays and am happy to help if you need a follow up virtual visit on those days. Otherwise, if you have any concerns or questions following this visit please schedule a follow up visit with your Primary Care doctor or seek care at a local urgent care clinic to avoid delays in care.    Seek in person  care or schedule a follow up video visit promptly if your symptoms worsen, new concerns arise or you are not improving with treatment. Call 911 and/or seek emergency care if your symptoms are severe or life threatening.  PLEASE SEE THE FOLLOWING LINK FOR THE MOST UPDATED INFORMATION ABOUT LAGEVRIO:  [email or web address]      Fact Sheet for Patients And Caregivers Emergency Use Authorization (EUA) Of LAGEVRIOT (molnupiravir) capsules For Coronavirus Disease 2019 (COVID-19)  What is the most important information I should know about LAGEVRIO? LAGEVRIO may cause serious side effects, including: ? LAGEVRIO may cause harm to your unborn baby. It is not known if LAGEVRIO will harm your baby if you take LAGEVRIO during pregnancy. o LAGEVRIO is not recommended for use in pregnancy. o LAGEVRIO has not been studied in pregnancy. LAGEVRIO was studied in pregnant animals only. When LAGEVRIO was given to pregnant animals, LAGEVRIO caused harm to their unborn babies. o You and your healthcare provider may decide that you should take LAGEVRIO during pregnancy if there are no other COVID-19 treatment options approved or authorized by the FDA that are accessible or clinically appropriate for you. o If you and your healthcare provider decide that you should take LAGEVRIO during pregnancy, you and your healthcare provider should discuss the known and potential benefits and the potential risks of taking LAGEVRIO during pregnancy. For individuals who are able to become pregnant: ? You  should use a reliable method of birth control (contraception) consistently and correctly during treatment with LAGEVRIO and for 4 days after the last dose of LAGEVRIO. Talk to your healthcare provider about reliable birth control methods. ? Before starting treatment with [facility] your healthcare provider may do a pregnancy test to see if you are pregnant before starting treatment with LAGEVRIO. ? Tell your healthcare  provider right away if you become pregnant or think you may be pregnant during treatment with LAGEVRIO. Pregnancy Surveillance Program: ? There is a pregnancy surveillance program for individuals who take LAGEVRIO during pregnancy. The purpose of this program is to collect information about the health of you and your baby. Talk to your healthcare provider about how to take part in this program. ? If you take LAGEVRIO during pregnancy and you agree to participate in the pregnancy surveillance program and allow your healthcare provider to share your information with [place], then your healthcare provider will report your use of [place] during pregnancy to [place]. by calling [phone] or PeacefulBlog.es. For individuals who are sexually active with partners who are able to become pregnant: ? It is not known if LAGEVRIO can affect sperm. While the risk is regarded as low, animal studies to fully assess the potential for LAGEVRIO to affect the babies of males treated with LAGEVRIO have not been completed. A reliable method of birth control (contraception) should be used consistently and correctly during treatment with LAGEVRIO and for at least 3 months after the last dose. The risk to sperm beyond 3 months is not known. Studies to understand the risk to sperm beyond 3 months are ongoing. Talk to your healthcare provider about reliable birth control methods. Talk to your healthcare provider if you have questions or concerns about how LAGEVRIO may affect sperm. You are being given this fact sheet because your healthcare provider believes it is necessary to provide you with LAGEVRIO for the treatment of adults with mild-to-moderate coronavirus disease 2019 (COVID-19) with positive results of direct SARS-CoV-2 viral testing, and who are at high risk for progression to severe COVID-19 including hospitalization or death, and for whom other COVID-19  treatment options approved or authorized by the FDA are not accessible or clinically appropriate. The U.S. Food and Drug Administration (FDA) has issued an Emergency Use Authorization (EUA) to make LAGEVRIO available during the COVID-19 pandemic (for more details about an EUA please see "What is an Emergency Use Authorization?" at the end of this document). LAGEVRIO is not an FDA-approved medicine in the [place]. Read this Fact Sheet for information about LAGEVRIO. Talk to your healthcare provider about your options if you have any questions. It is your choice to take LAGEVRIO.  What is COVID-19? COVID-19 is caused by a virus called a coronavirus. You can get COVID-19 through close contact with another person who has the virus. COVID-19 illnesses have ranged from very mild-to-severe, including illness resulting in death. While information so far suggests that most COVID-19 illness is mild, serious illness can happen and may cause some of your other medical conditions to become worse. Older people and people of all ages with severe, long lasting (chronic) medical conditions like heart disease, lung disease and diabetes, for example seem to be at higher risk of being hospitalized for COVID-19.  What is LAGEVRIO? LAGEVRIO is an investigational medicine used to treat mild-to-moderate COVID-19 in adults: ? with positive results of direct SARS-CoV-2 viral testing, and ? who are at high risk for  progression to severe COVID-19 including hospitalization or death, and for whom other COVID-19 treatment options approved or authorized by the FDA are not accessible or clinically appropriate. The FDA has authorized the emergency use of LAGEVRIO for the treatment of mild-tomoderate COVID-19 in adults under an EUA. For more information on EUA, see the "What is an Emergency Use Authorization (EUA)?" section at the end of this Fact Sheet. LAGEVRIO is not authorized: ? for use in people less than 80  years of age. ? for prevention of COVID-19. ? for people needing hospitalization for COVID-19. ? for use for longer than 5 consecutive days.  What should I tell my healthcare provider before I take LAGEVRIO? Tell your healthcare provider if you: ? Have any allergies ? Are breastfeeding or plan to breastfeed ? Have any serious illnesses ? Are taking any medicines (prescription, over-the-counter, vitamins, or herbal products).  How do I take LAGEVRIO? ? Take LAGEVRIO exactly as your healthcare provider tells you to take it. ? Take 4 capsules of LAGEVRIO every 12 hours (for example, at 8 am and at 8 pm) ? Take LAGEVRIO for 5 days. It is important that you complete the full 5 days of treatment with LAGEVRIO. Do not stop taking LAGEVRIO before you complete the full 5 days of treatment, even if you feel better. ? Take LAGEVRIO with or without food. ? You should stay in isolation for as long as your healthcare provider tells you to. Talk to your healthcare provider if you are not sure about how to properly isolate while you have COVID-19. ? Swallow LAGEVRIO capsules whole. Do not open, break, or crush the capsules. If you cannot swallow capsules whole, tell your healthcare provider. ? What to do if you miss a dose: o If it has been less than 10 hours since the missed dose, take it as soon as you remember o If it has been more than 10 hours since the missed dose, skip the missed dose and take your dose at the next scheduled time. ? Do not double the dose of LAGEVRIO to make up for a missed dose.  What are the important possible side effects of LAGEVRIO? ? See, "What is the most important information I should know about LAGEVRIO?" ? Allergic Reactions. Allergic reactions can happen in people taking LAGEVRIO, even after only 1 dose. Stop taking LAGEVRIO and call your healthcare provider right away if you get any of the following symptoms of an allergic reaction: o hives o rapid  heartbeat o trouble swallowing or breathing o swelling of the mouth, lips, or face o throat tightness o hoarseness o skin rash The most common side effects of LAGEVRIO are: ? diarrhea ? nausea ? dizziness These are not all the possible side effects of LAGEVRIO. Not many people have taken LAGEVRIO. Serious and unexpected side effects may happen. This medicine is still being studied, so it is possible that all of the risks are not known at this time.  What other treatment choices are there?  Veklury (remdesivir) is FDA-approved as an intravenous (IV) infusion for the treatment of mildto-moderate OYDXA-12 in certain adults and children. Talk with your doctor to see if Marijean Heath is appropriate for you. Like LAGEVRIO, FDA may also allow for the emergency use of other medicines to treat people with COVID-19. Go to LacrosseProperties.si for more information. It is your choice to be treated or not to be treated with LAGEVRIO. Should you decide not to take it, it will not change your standard medical  care.  What if I am breastfeeding? Breastfeeding is not recommended during treatment with LAGEVRIO and for 4 days after the last dose of LAGEVRIO. If you are breastfeeding or plan to breastfeed, talk to your healthcare provider about your options and specific situation before taking LAGEVRIO.  How do I report side effects with LAGEVRIO? Contact your healthcare provider if you have any side effects that bother you or do not go away. Report side effects to FDA MedWatch at SmoothHits.hu or call 1-800-FDA-1088 (1- 786-838-3607).  How should I store [place]? ? Store LAGEVRIO capsules at room temperature between 26F to 29F (20C to 25C). ? Keep LAGEVRIO and all medicines out of the reach of children and pets. How can I learn more about COVID-19? ? Ask your healthcare provider. ? Visit  SeekRooms.co.uk ? Contact your local or state public health department. ? Call [place] at [phone] (toll free in the [place] ? Visit [email or web address].com  What Is an Emergency Use Authorization (EUA)? The [place] FDA has made [place] available under an emergency access mechanism called an Emergency Use Authorization (EUA) The EUA is supported by a Presenter, broadcasting Health and Human Service (HHS) declaration that circumstances exist to justify emergency use of drugs and biological products during the COVID-19 pandemic. LAGEVRIO for the treatment of mild-to-moderate COVID-19 in adults with positive results of direct SARS-CoV-2 viral testing, who are at high risk for progression to severe COVID-19, including hospitalization or death, and for whom alternative COVID-19 treatment options approved or authorized by FDA are not accessible or clinically appropriate, has not undergone the same type of review as an FDA-approved product. In issuing an EUA under the XFQHK-25 public health emergency, the FDA has determined, among other things, that based on the total amount of scientific evidence available including data from adequate and well-controlled clinical trials, if available, it is reasonable to believe that the product may be effective for diagnosing, treating, or preventing COVID-19, or a serious or life-threatening disease or condition caused by COVID-19; that the known and potential benefits of the product, when used to diagnose, treat, or prevent such disease or condition, outweigh the known and potential risks of such product; and that there are no adequate, approved, and available alternatives.  All of these criteria must be met to allow for the product to be used in the treatment of patients during the COVID-19 pandemic. The EUA for LAGEVRIO is in effect for the duration of the COVID-19 declaration justifying emergency use of LAGEVRIO, unless terminated or  revoked (after which LAGEVRIO may no longer be used under the EUA). For patent information: http://rogers.info/ Copyright  2021-2022 [employer] [place], [place] and its affiliates. All rights reserved. [id number] Revised: March 2022

## 2022-08-17 NOTE — Progress Notes (Signed)
Virtual Visit via Video Note  I connected with Debra Holloway  on 08/17/22 at  3:00 PM EST by a video enabled telemedicine application and verified that I am speaking with the correct person using two identifiers.  Location patient: [place] Location provider:work or home office Persons participating in the virtual visit: patient, provider  I discussed the limitations and requested verbal permission for telemedicine visit. The patient expressed understanding and agreed to proceed.   HPI:  Acute telemedicine visit for Covid19: -Onset: 2 days ago, tested positive for covid today -Symptoms include: post nasal drainage, cough, headache -reports symptoms are mild and she thought was just allergies -Denies: fever, CP, SOB, NVD, body aches -Has tried: musinex -Pertinent past medical history: see below, never had covid and never had any vaccines, GFR was 68 on recent labs -Pertinent medication allergies: Allergies  Allergen Reactions   Bee Venom Anaphylaxis   Losartan Other (See Comments)    Pt reports cough and insomnia.   Erythromycin Itching and Rash   Lovastatin Other (See Comments)    weakness   Nickel Rash   Penicillins Hives, Itching and Rash   Simvastatin Other (See Comments)    weakness  -COVID-19 vaccine status:  Immunization History  Administered Date(s) Administered   Influenza,inj,Quad PF,6+ Mos 09/07/2014, 07/30/2018   Tdap 09/01/2013     ROS: See pertinent positives and negatives per HPI.  Past Medical History:  Diagnosis Date   ADD (attention deficit disorder)    AICD (automatic cardioverter/defibrillator) present 2015/09/03   Anxiety    "just when my daddy died a few years ago" (2015/09/03)   AR (allergic rhinitis)    Chicken pox    Chronic combined systolic and diastolic CHF (congestive heart failure) ([facility])    Depression    "just when my daddy died a few years ago" (09/03/15)   Dyslipidemia    Environmental allergies    H/O vitamin D deficiency    LBBB (left  bundle branch block)    W/1st degree AV Block, Avoid Beta Blockers   NICM (nonischemic cardiomyopathy) (H[facility])    OSA (obstructive sleep apnea) 03/18/2015   Severe OSA with AHI 30/hr; "suppose to wear mask but don't" (2015/09/03)   Seasonal affective disorder ([place])     Past Surgical History:  Procedure Laterality Date   CARDIAC CATHETERIZATION     DILATION AND CURETTAGE OF UTERUS  2003   EP IMPLANTABLE DEVICE N/A September 03, 2015   Procedure: BiV ICD Insertion CRT-D;  Surgeon: Deboraha Sprang, MD;  Location: [facility];  Service: Cardiovascular;  Laterality: N/A;   LEFT AND RIGHT HEART CATHETERIZATION WITH CORONARY ANGIOGRAM N/A 11/17/2014   Procedure: LEFT AND RIGHT HEART CATHETERIZATION WITH CORONARY ANGIOGRAM;  Surgeon: Troy Sine, MD;  Location: [facility] C[facility] LAB;  Service: Cardiovascular;  Laterality: N/A;   TOTAL ABDOMINAL HYSTERECTOMY  2004   TUBAL LIGATION     WISDOM TOOTH EXTRACTION  1986     Current Outpatient Medications:    b complex vitamins tablet, Take 1 tablet by mouth daily., Disp: , Rfl:    buPROPion (WELLBUTRIN XL) 300 MG 24 hr tablet, Take 1 tablet (300 mg total) by mouth daily., Disp: 90 tablet, Rfl: 1   carvedilol (COREG) 6.25 MG tablet, Take 1 tablet (6.25 mg total) by mouth 2 (two) times daily., Disp: 180 tablet, Rfl: 3   Cholecalciferol (VITAMIN D PO), Take 2,000 mg by mouth daily. , Disp: , Rfl:    fexofenadine (ALLEGRA) 180 MG tablet, Take 180 mg by mouth  daily., Disp: , Rfl:    fluticasone (FLONASE) 50 MCG/ACT nasal spray, Place 1 spray into both nostrils daily., Disp: , Rfl:    furosemide (LASIX) 40 MG tablet, Take 1 tablet (40 mg total) by mouth daily., Disp: 90 tablet, Rfl: 3   lisdexamfetamine (VYVANSE) 50 MG capsule, Take 1 capsule (50 mg total) by mouth daily., Disp: 30 capsule, Rfl: 0   lisinopril (ZESTRIL) 5 MG tablet, Take 1 tablet (5 mg total) by mouth daily., Disp: 90 tablet, Rfl: 3   mirabegron ER (MYRBETRIQ) 50 MG TB24 tablet, Take 1 tablet (50 mg  total) by mouth daily., Disp: 90 tablet, Rfl: 1   molnupiravir EUA (LAGEVRIO) 200 mg CAPS capsule, Take 4 capsules (800 mg total) by mouth 2 (two) times daily for 5 days., Disp: 40 capsule, Rfl: 0   potassium chloride (KLOR-CON) 10 MEQ tablet, Take 1 tablet (10 mEq total) by mouth daily., Disp: 90 tablet, Rfl: 3   spironolactone (ALDACTONE) 25 MG tablet, Take 1 tablet (25 mg total) by mouth daily., Disp: 45 tablet, Rfl: 3  EXAM:  VITALS per patient if applicable:  GENERAL: alert, oriented, appears well and in no acute distress  HEENT: atraumatic, conjunttiva clear, no obvious abnormalities on inspection of external nose and ears  NECK: normal movements of the head and neck  LUNGS: on inspection no signs of respiratory distress, breathing rate appears normal, no obvious gross SOB, gasping or wheezing  CV: no obvious cyanosis  MS: moves all visible extremities without noticeable abnormality  PSYCH/NEURO: pleasant and cooperative, no obvious depression or anxiety, speech and thought processing grossly intact  ASSESSMENT AND PLAN:  Discussed the following assessment and plan:  COVID-19   Discussed treatment options, side effect and risk of drug interactions, ideal treatment window, potential complications, isolation and precautions for COVID-19.  Discussed possibility of rebound with or without antivirals. Checked for/reviewed last GFR - listed in HPI if available. After lengthy discussion, the patient opted for treatment with legevrio due to being higher risk for complications of covid or severe disease and other factors. She was anxious about potential interactions with several of her medications and preferred not to adjust or hold them. Discussed EUA status of this drug and the fact that there is preliminary limited knowledge of risks/interactions/side effects per EUA document vs possible benefits and precautions. This information was shared with patient during the visit and also was  provided in patient instructions. Also, advised that patient discuss risks/interactions and use with pharmacist/treatment team as well. Other symptomatic care measures summarized in patient instructions.  Advised to seek prompt virtual visit or in person care if worsening, new symptoms arise, or if is not improving with treatment as expected per our conversation of expected course. Discussed options for follow up care. Did let this patient know that I do telemedicine on Tuesdays and Thursdays for [place] and those are the days I am logged into the system. Advised to schedule follow up visit with PCP, [place] virtual visits or [facility] if any further questions or concerns to avoid delays in care.   I discussed the assessment and treatment plan with the patient. The patient was provided an opportunity to ask questions and all were answered. The patient agreed with the plan and demonstrated an understanding of the instructions.     Lucretia Kern, DO

## 2022-08-31 ENCOUNTER — Ambulatory Visit (INDEPENDENT_AMBULATORY_CARE_PROVIDER_SITE_OTHER): Payer: Medicare Other

## 2022-08-31 DIAGNOSIS — I420 Dilated cardiomyopathy: Secondary | ICD-10-CM

## 2022-08-31 LAB — CUP PACEART REMOTE DEVICE CHECK
Battery Remaining Longevity: 5 mo
Battery Voltage: 2.8 V
Brady Statistic AP VP Percent: 0.15 %
Brady Statistic AP VS Percent: 0.03 %
Brady Statistic AS VP Percent: 98.93 %
Brady Statistic AS VS Percent: 0.9 %
Brady Statistic RA Percent Paced: 0.17 %
Brady Statistic RV Percent Paced: 98.94 %
Date Time Interrogation Session: 20231130044225
HighPow Impedance: 76 Ohm
Implantable Lead Connection Status: 753985
Implantable Lead Connection Status: 753985
Implantable Lead Connection Status: 753985
Implantable Lead Implant Date: 20161114
Implantable Lead Implant Date: 20161114
Implantable Lead Implant Date: 20161114
Implantable Lead Location: 753858
Implantable Lead Location: 753859
Implantable Lead Location: 753860
Implantable Lead Model: 4598
Implantable Lead Model: 5076
Implantable Pulse Generator Implant Date: 20161114
Lead Channel Impedance Value: 361 Ohm
Lead Channel Impedance Value: 418 Ohm
Lead Channel Impedance Value: 418 Ohm
Lead Channel Impedance Value: 456 Ohm
Lead Channel Impedance Value: 456 Ohm
Lead Channel Impedance Value: 456 Ohm
Lead Channel Impedance Value: 589 Ohm
Lead Channel Impedance Value: 646 Ohm
Lead Channel Impedance Value: 760 Ohm
Lead Channel Impedance Value: 779 Ohm
Lead Channel Impedance Value: 893 Ohm
Lead Channel Impedance Value: 893 Ohm
Lead Channel Impedance Value: 931 Ohm
Lead Channel Pacing Threshold Amplitude: 0.625 V
Lead Channel Pacing Threshold Amplitude: 0.625 V
Lead Channel Pacing Threshold Amplitude: 1.125 V
Lead Channel Pacing Threshold Pulse Width: 0.4 ms
Lead Channel Pacing Threshold Pulse Width: 0.4 ms
Lead Channel Pacing Threshold Pulse Width: 0.4 ms
Lead Channel Sensing Intrinsic Amplitude: 1.875 mV
Lead Channel Sensing Intrinsic Amplitude: 1.875 mV
Lead Channel Sensing Intrinsic Amplitude: 24.875 mV
Lead Channel Sensing Intrinsic Amplitude: 24.875 mV
Lead Channel Setting Pacing Amplitude: 2 V
Lead Channel Setting Pacing Amplitude: 2.5 V
Lead Channel Setting Pacing Amplitude: 2.75 V
Lead Channel Setting Pacing Pulse Width: 0.4 ms
Lead Channel Setting Pacing Pulse Width: 0.4 ms
Lead Channel Setting Sensing Sensitivity: 0.45 mV
Zone Setting Status: 755011

## 2022-09-07 DIAGNOSIS — R3915 Urgency of urination: Secondary | ICD-10-CM | POA: Diagnosis not present

## 2022-09-11 ENCOUNTER — Encounter: Payer: Self-pay | Admitting: Family Medicine

## 2022-09-11 DIAGNOSIS — F988 Other specified behavioral and emotional disorders with onset usually occurring in childhood and adolescence: Secondary | ICD-10-CM

## 2022-09-11 MED ORDER — LISDEXAMFETAMINE DIMESYLATE 50 MG PO CAPS: 50.0000 mg | ORAL_CAPSULE | Freq: Every day | ORAL | 0 refills | Status: DC

## 2022-09-19 ENCOUNTER — Ambulatory Visit (INDEPENDENT_AMBULATORY_CARE_PROVIDER_SITE_OTHER): Payer: Medicare Other | Admitting: Family

## 2022-09-19 ENCOUNTER — Encounter: Payer: Self-pay | Admitting: Family

## 2022-09-19 VITALS — BP 116/79 | HR 84 | Temp 97.5°F | Ht 65.5 in | Wt 231.2 lb

## 2022-09-19 DIAGNOSIS — J329 Chronic sinusitis, unspecified: Secondary | ICD-10-CM

## 2022-09-19 DIAGNOSIS — B9789 Other viral agents as the cause of diseases classified elsewhere: Secondary | ICD-10-CM

## 2022-09-19 DIAGNOSIS — J029 Acute pharyngitis, unspecified: Secondary | ICD-10-CM

## 2022-09-19 LAB — POC COVID19 BINAXNOW: SARS Coronavirus 2 Ag: NEGATIVE

## 2022-09-19 LAB — POCT RAPID STREP A (OFFICE): Rapid Strep A Screen: NEGATIVE

## 2022-09-19 NOTE — Progress Notes (Signed)
Patient ID: Debra Holloway, female    DOB: [birth year], 66 y.o.   MRN: [id number]  Chief Complaint  Patient presents with   Sore Throat    Pt c/o sore throat, body aches, headaches, cold sweats for about 2 days. Has tried Amoxicillin from a family member and tylenol q4h which did help slightly.    HPI:      URI sx:  Pt c/o sore throat, body aches, headaches, cold sweats for about 2 days. Has tried Amoxicillin from a family member and tylenol q4h which did help slightly.  2 weeks before Thanksgiving she was positive for covid, took Molnupiravir and felt better, but took over a week.        Assessment & Plan:  1. Sore throat - rapid strep neg. pt took sister's AMOX (2 pills) yesterday.Advised pt to not take any more of this & return in 2 days for retesting if sore throat is not improved. Advised pt to take Tylenol 1,'000mg'$  q4h for sore throat pain, swelling, and fever. Gargle with warm salt water several tid. OK to use OTC Chloraseptic spray and/or throat lozenges prn. Drink plenty of water.   - POCT rapid strep A  2. Viral sinusitis - rapid covid neg. =advised pt use saline nasal spray tid & prior to Flonase spray, increase this to bid for the next week or until sx are improved. Ok to also take an extra oral antihistamine to control nasal/throat drainage - generic Claritin or Xyzal.   - POC COVID-19  Subjective:    Outpatient Medications Prior to Visit  Medication Sig Dispense Refill   b complex vitamins tablet Take 1 tablet by mouth daily.     buPROPion (WELLBUTRIN XL) 300 MG 24 hr tablet Take 1 tablet (300 mg total) by mouth daily. 90 tablet 1   carvedilol (COREG) 6.25 MG tablet Take 1 tablet (6.25 mg total) by mouth 2 (two) times daily. 180 tablet 3   Cholecalciferol (VITAMIN D PO) Take 2,000 mg by mouth daily.      fexofenadine (ALLEGRA) 180 MG tablet Take 180 mg by mouth daily.     fluticasone (FLONASE) 50 MCG/ACT nasal spray Place 1 spray into both nostrils daily.     furosemide  (LASIX) 40 MG tablet Take 1 tablet (40 mg total) by mouth daily. 90 tablet 3   lisdexamfetamine (VYVANSE) 50 MG capsule Take 1 capsule (50 mg total) by mouth daily. 30 capsule 0   lisinopril (ZESTRIL) 5 MG tablet Take 1 tablet (5 mg total) by mouth daily. 90 tablet 3   mirabegron ER (MYRBETRIQ) 50 MG TB24 tablet Take 1 tablet (50 mg total) by mouth daily. 90 tablet 1   potassium chloride (KLOR-CON) 10 MEQ tablet Take 1 tablet (10 mEq total) by mouth daily. 90 tablet 3   spironolactone (ALDACTONE) 25 MG tablet Take 1 tablet (25 mg total) by mouth daily. 45 tablet 3   No facility-administered medications prior to visit.   Past Medical History:  Diagnosis Date   ADD (attention deficit disorder)    AICD (automatic cardioverter/defibrillator) present 09-07-2015   Anxiety    "just when my daddy died a few years ago" (09/07/2015)   AR (allergic rhinitis)    Chicken pox    Chronic combined systolic and diastolic CHF (congestive heart failure) ([place])    Depression    "just when my daddy died a few years ago" (2015-09-07)   Dyslipidemia    Environmental allergies    H/O vitamin D deficiency  LBBB (left bundle branch block)    W/1st degree AV Block, Avoid Beta Blockers   NICM (nonischemic cardiomyopathy) (HCC)    OSA (obstructive sleep apnea) 03/18/2015   Severe OSA with AHI 30/hr; "suppose to wear mask but don't" (08/16/2015)   Seasonal affective disorder [facility])    Past Surgical History:  Procedure Laterality Date   CARDIAC CATHETERIZATION     DILATION AND CURETTAGE OF UTERUS  2003   EP IMPLANTABLE DEVICE N/A 08/16/2015   Procedure: BiV ICD Insertion CRT-D;  Surgeon: Deboraha Sprang, MD;  Location: [place] CV LAB;  Service: Cardiovascular;  Laterality: N/A;   LEFT AND RIGHT HEART CATHETERIZATION WITH CORONARY ANGIOGRAM N/A 11/17/2014   Procedure: LEFT AND RIGHT HEART CATHETERIZATION WITH CORONARY ANGIOGRAM;  Surgeon: Troy Sine, MD;  Location: [facility] CATH LAB;  Service: Cardiovascular;   Laterality: N/A;   TOTAL ABDOMINAL HYSTERECTOMY  2004   TUBAL LIGATION     WISDOM TOOTH EXTRACTION  1986   Allergies  Allergen Reactions   Bee Venom Anaphylaxis   Losartan Other (See Comments)    Pt reports cough and insomnia.   Erythromycin Itching and Rash   Lovastatin Other (See Comments)    weakness   Nickel Rash   Penicillins Hives, Itching and Rash   Simvastatin Other (See Comments)    weakness      Objective:    Physical Exam Vitals and nursing note reviewed.  Constitutional:      Appearance: Normal appearance. She is ill-appearing.     Interventions: Face mask in place.  HENT:     Right Ear: Tympanic membrane and ear canal normal.     Left Ear: Tympanic membrane and ear canal normal.     Nose:     Right Sinus: No frontal sinus tenderness (feels pressure).     Left Sinus: No frontal sinus tenderness (feels pressure).     Mouth/Throat:     Mouth: Mucous membranes are moist.     Pharynx: Posterior oropharyngeal erythema present. No pharyngeal swelling, oropharyngeal exudate or uvula swelling.     Tonsils: No tonsillar exudate or tonsillar abscesses.  Cardiovascular:     Rate and Rhythm: Normal rate and regular rhythm.  Pulmonary:     Effort: Pulmonary effort is normal.     Breath sounds: Normal breath sounds.  Musculoskeletal:        General: Normal range of motion.  Lymphadenopathy:     Head:     Right side of head: No preauricular or posterior auricular adenopathy.     Left side of head: No preauricular or posterior auricular adenopathy.     Cervical: Cervical adenopathy present.     Right cervical: Superficial cervical adenopathy present.     Left cervical: Superficial cervical adenopathy present.  Skin:    General: Skin is warm and dry.  Neurological:     Mental Status: She is alert.  Psychiatric:        Mood and Affect: Mood normal.        Behavior: Behavior normal.    BP 116/79 (BP Location: Left Arm, Patient Position: Sitting, Cuff Size: Large)    Pulse 84   Temp (!) 97.5 F (36.4 C) (Temporal)   Ht 5' 5.5" (1.664 m)   Wt 231 lb 4 oz (104.9 kg)   SpO2 95%   BMI 37.90 kg/m  Wt Readings from Last 3 Encounters:  09/19/22 231 lb 4 oz (104.9 kg)  07/11/22 230 lb 6.4 oz (104.5 kg)  07/10/22  231 lb 3.2 oz (104.9 kg)      Jeanie Sewer, NP

## 2022-09-20 NOTE — Progress Notes (Signed)
Remote ICD transmission.   

## 2022-09-22 ENCOUNTER — Telehealth: Payer: Self-pay | Admitting: Family Medicine

## 2022-09-22 DIAGNOSIS — J9811 Atelectasis: Secondary | ICD-10-CM | POA: Diagnosis not present

## 2022-09-22 DIAGNOSIS — R0682 Tachypnea, not elsewhere classified: Secondary | ICD-10-CM | POA: Diagnosis not present

## 2022-09-22 DIAGNOSIS — R0602 Shortness of breath: Secondary | ICD-10-CM | POA: Diagnosis not present

## 2022-09-22 DIAGNOSIS — Z1152 Encounter for screening for COVID-19: Secondary | ICD-10-CM | POA: Diagnosis not present

## 2022-09-22 DIAGNOSIS — J069 Acute upper respiratory infection, unspecified: Secondary | ICD-10-CM | POA: Diagnosis not present

## 2022-09-22 NOTE — Telephone Encounter (Signed)
Pt was advised to call EMS 911  Patient Name: Debra Holloway Gender: Female DOB: 1956/04/11 Age: 66 Y 11 M 22 D Return Phone Number: [phone] (Primary) Address: City/ State/ Zip: [place]  [address] Client [facility] Client Site [facility] [place] Night Provider Dimas Chyle- MD Contact Type Call Who Is Calling Patient / Member / Family / Caregiver Call Type Triage / Clinical Relationship To Patient Self Return Phone Number [phone] (Primary) Chief Complaint CHEST PAIN - pain, pressure, heaviness or tightness Reason for Call Symptomatic / Request for [place] states she was seen in office Tuesday and now has a non stop cough and chest is burning. [facility] Translation No Nurse Assessment Nurse: Larene Pickett, RN, Marcie Bal Date/Time Eilene Ghazi Time): 09/22/2022 7:58:55 AM Confirm and document reason for call. If symptomatic, describe symptoms. ---Caller states she was seen in office Monday for headache and swollen throat, cervical glands are swollen and tender to the touch. Tuesday and now has a non stop cough and chest is burning with and without a cough. Chest burning worse when trying to take a deep breath. Denies radiating chest pain. Denies fever. Does the patient have any new or worsening symptoms? ---Yes Will a triage be completed? ---Yes Related visit to physician within the last 2 weeks? ---Yes Does the PT have any chronic conditions? (i.e. diabetes, asthma, this includes High risk factors for pregnancy, etc.) ---Yes List chronic conditions. ---Heart failure since 2016 Pacemaker/Defibrillator Is this a behavioral health or substance abuse call? ---No Guidelines Guideline Title Affirmed Question Affirmed Notes Nurse Date/Time Eilene Ghazi Time) Chest Pain [1] Chest pain lasts > 5 minutes AND [2] age > 41 Larene Pickett, RN, Marcie Bal  09/22/2022 8:02:22 AM Disp. Time Eilene Ghazi Time) Disposition Final User 09/22/2022 7:57:25 AM Send to Urgent Elease Hashimoto 09/22/2022 8:04:57 AM Call EMS 911 Now Yes Larene Pickett, RNMarcie Bal 09/22/2022 8:08:22 AM 911 Outcome Documentation Larene Pickett, RN, Marcie Bal Reason: Having her daughter take her to the emergency room. Final Disposition 09/22/2022 8:04:57 AM Call EMS 911 Now Yes Larene Pickett, RN, Lenon Oms Disagree/Comply Disagree Caller Understands Yes PreDisposition InappropriateToAsk Care Advice Given Per Guideline CALL EMS 911 NOW: * Immediate medical attention is needed. You need to hang up and call 911 (or an ambulance). NOTE TO TRIAGER - IF CALLER ASKS ABOUT ASPIRIN: * Call EMS 911 first. CARE ADVICE given per Chest Pain (Adult) guideline. * Triager Discretion: I'll call you back in a few minutes to be sure you were able to reach them. Comments User: Lynnell Catalan, RN Date/Time Eilene Ghazi Time): 09/22/2022 8:07:39 AM Having daughter take her to the emergency room. Referrals GO TO FACILITY OTHER - SPECIFY

## 2022-09-22 NOTE — Telephone Encounter (Signed)
Patient present at ED

## 2022-09-28 ENCOUNTER — Ambulatory Visit (INDEPENDENT_AMBULATORY_CARE_PROVIDER_SITE_OTHER): Payer: Medicare Other

## 2022-09-28 VITALS — Wt 239.0 lb

## 2022-09-28 DIAGNOSIS — Z0000 Encounter for general adult medical examination without abnormal findings: Secondary | ICD-10-CM

## 2022-09-28 NOTE — Progress Notes (Addendum)
I connected with  Dayannara Pascal Cobin on 09/28/22 by a audio enabled telemedicine application and verified that I am speaking with the correct person using two identifiers.  Patient Location: Home  Provider Location: Office/Clinic  I discussed the limitations of evaluation and management by telemedicine. The patient expressed understanding and agreed to proceed.   Subjective:   LIBERTIE HAUSLER is a 66 y.o. female who presents for Medicare Annual (Subsequent) preventive examination.  Review of Systems     Cardiac Risk Factors include: advanced age (>78mn, >>40women);dyslipidemia;obesity (BMI >30kg/m2)     Objective:    Today's Vitals   09/28/22 1026  Weight: 239 lb (108.4 kg)   Body mass index is 39.17 kg/m.     09/28/2022   10:34 AM 04/20/2020   10:31 AM 05/24/2017   10:27 AM 112-05-2015   8:32 AM 03/26/2015    8:35 PM 03/04/2015    8:53 PM 01/28/2015   10:06 PM  Advanced Directives  Does Patient Have a Medical Advance Directive? Yes No No No No No No  Type of AParamedicof AGuyLiving will        Copy of [place] Chart? No - copy requested        Would patient like information on creating a medical advance directive?  No - Patient declined  No - patient declined information No - patient declined information No - patient declined information No - patient declined information    Current Medications (verified) Outpatient Encounter Medications as of 09/28/2022  Medication Sig   b complex vitamins tablet Take 1 tablet by mouth daily.   benzonatate (TESSALON) 100 MG capsule Take by mouth.   buPROPion (WELLBUTRIN XL) 300 MG 24 hr tablet Take 1 tablet (300 mg total) by mouth daily.   carvedilol (COREG) 6.25 MG tablet Take 1 tablet (6.25 mg total) by mouth 2 (two) times daily.   Cholecalciferol (VITAMIN D PO) Take 2,000 mg by mouth daily.    fexofenadine (ALLEGRA) 180 MG tablet Take 180 mg by mouth daily.   fluticasone (FLONASE) 50  MCG/ACT nasal spray Place 1 spray into both nostrils daily.   furosemide (LASIX) 40 MG tablet Take 1 tablet (40 mg total) by mouth daily.   guaiFENesin (ROBITUSSIN) 100 MG/5ML liquid Take by mouth.   lisdexamfetamine (VYVANSE) 50 MG capsule Take 1 capsule (50 mg total) by mouth daily.   potassium chloride (KLOR-CON) 10 MEQ tablet Take 1 tablet (10 mEq total) by mouth daily.   spironolactone (ALDACTONE) 25 MG tablet Take 1 tablet (25 mg total) by mouth daily.   lisinopril (ZESTRIL) 5 MG tablet Take 1 tablet (5 mg total) by mouth daily. (Patient not taking: Reported on 09/28/2022)   [DISCONTINUED] mirabegron ER (MYRBETRIQ) 50 MG TB24 tablet Take 1 tablet (50 mg total) by mouth daily.   No facility-administered encounter medications on file as of 09/28/2022.    Allergies (verified) Bee venom, Codeine, Losartan, Erythromycin, Lovastatin, Nickel, Penicillins, and Simvastatin   History: Past Medical History:  Diagnosis Date   ADD (attention deficit disorder)    AICD (automatic cardioverter/defibrillator) present 1December 08, 2016  Anxiety    "just when my daddy died a few years ago" (1Dec 08, 2016   AR (allergic rhinitis)    Chicken pox    Chronic combined systolic and diastolic CHF (congestive heart [place]) [place]    Depression    "just when my daddy died a few years ago" (112/05/2015   Dyslipidemia    Environmental  allergies    H/O vitamin D deficiency    LBBB (left bundle branch block)    W/1st degree AV Block, Avoid Beta Blockers   NICM (nonischemic cardiomyopathy) (HCC)    OSA (obstructive sleep apnea) 03/18/2015   Severe OSA with AHI 30/hr; "suppose to wear mask but don't" (08/16/2015)   Seasonal affective disorder [facility])    Past Surgical History:  Procedure Laterality Date   CARDIAC CATHETERIZATION     DILATION AND CURETTAGE OF UTERUS  2003   EP IMPLANTABLE DEVICE N/A 08/16/2015   Procedure: BiV ICD Insertion CRT-D;  Surgeon: Deboraha Sprang, MD;  Location: [place] CV LAB;   Service: Cardiovascular;  Laterality: N/A;   LEFT AND RIGHT HEART CATHETERIZATION WITH CORONARY ANGIOGRAM N/A 11/17/2014   Procedure: LEFT AND RIGHT HEART CATHETERIZATION WITH CORONARY ANGIOGRAM;  Surgeon: Troy Sine, MD;  Location[facility] CATH LAB;  Service: Cardiovascular;  Laterality: N/A;   TOTAL ABDOMINAL HYSTERECTOMY  2004   TUBAL LIGATION     WISDOM TOOTH EXTRACTION  1986   Family History  Problem Relation Age of Onset   COPD Mother        Living   Pancreatic cancer Father 85       Deceased   Lung cancer Paternal Grandfather    Heart disease Paternal Grandmother    Hypertension Paternal Grandmother    Alzheimer's disease Maternal Grandfather    Arthritis/Rheumatoid Maternal Grandmother    Heart disease Maternal Grandmother        Tachycardia   Cancer Paternal Uncle    Healthy Brother        x3   Healthy Daughter        x3   Breast cancer Sister 81   Social History   Socioeconomic History   Marital status: Divorced    Spouse name: Not on file   Number of children: Not on file   Years of education: Not on file   Highest education level: Not on file  Occupational History   Not on file  Tobacco Use   Smoking status: Never   Smokeless tobacco: Never  Vaping Use   Vaping Use: Never used  Substance and Sexual Activity   Alcohol use: Yes    Alcohol/week: 4.0 standard drinks of alcohol    Types: 4 Glasses of wine per week   Drug use: No   Sexual activity: Not Currently  Other Topics Concern   Not on file  Social History Narrative   Not on file   Social Determinants of Health   Financial Resource Strain: Low Risk  (09/28/2022)   Overall Financial Resource Strain (CARDIA)    Difficulty of Paying Living Expenses: Not hard at all  Food Insecurity: No Food Insecurity (09/28/2022)   Hunger Vital Sign    Worried About Running Out of Food in the Last Year: Never true    Ran Out of Food in the Last Year: Never true  Transportation Needs: No Transportation Needs  (09/28/2022)   PRAPARE - Hydrologist (Medical): No    Lack of Transportation (Non-Medical): No  Physical Activity: Inactive (09/28/2022)   Exercise Vital Sign    Days of Exercise per Week: 0 days    Minutes of Exercise per Session: 0 min  Stress: No Stress Concern Present (09/28/2022)   [place]    Feeling of Stress : Not at all  Social Connections: Moderately Isolated (09/28/2022)   Social Connection  and Isolation Panel [NHANES]    Frequency of Communication with Friends and Family: More than three times a week    Frequency of Social Gatherings with Friends and Family: More than three times a week    Attends Religious Services: 1 to 4 times per year    Active Member of [employer] or Organizations: No    Attends Music therapist: Never    Marital Status: Divorced    Tobacco Counseling Counseling given: Not Answered   Clinical Intake:  Pre-visit preparation completed: Yes  Pain : No/denies pain     BMI - recorded: 39.17 Nutritional Status: BMI > 30  Obese Nutritional Risks: None Diabetes: No  How often do you need to have someone help you when you read instructions, pamphlets, or other written materials from your doctor or pharmacy?: 1 - Never  Diabetic?no  Interpreter Needed?: No  Information entered by :: Charlott Rakes, LPN   Activities of Daily Living    09/28/2022   10:35 AM  In your present state of health, do you have any difficulty performing the following activities:  Hearing? 0  Vision? 0  Difficulty concentrating or making decisions? 0  Walking or climbing stairs? 0  Dressing or bathing? 0  Doing errands, shopping? 0  Preparing Food and eating ? N  Using the Toilet? N  In the past six months, have you accidently leaked urine? Y  Comment wears a pad  Do you have problems with loss of bowel control? N  Managing your Medications? N  Managing  your Finances? N  Housekeeping or managing your Housekeeping? N    Patient Care Team: Vivi Barrack, MD as PCP - General (Family Medicine) Dorothy Spark, MD as PCP - Cardiology (Cardiology) Deboraha Sprang, MD as PCP - Electrophysiology (Cardiology) Madelin Rear, [facility] (Inactive) as Pharmacist (Pharmacist)  Indicate any recent Medical Services you may have received from other than [facility] providers in the past year (date may be approximate).     Assessment:   This is a routine wellness examination for Beth[place].  Hearing/Vision screen Hearing Screening - Comments:: Pt denies any hearing issues  Vision Screening - Comments:: [facility] [place] for annual [facility] exams   Dietary issues and exercise activities discussed: Current Exercise Habits: The patient does not participate in regular exercise at present   Goals Addressed             This Visit's Progress    Patient Stated       Get rid of this cough and go back to the gym       Depression Screen    09/28/2022   10:31 AM 07/11/2022    8:50 AM 05/30/2022    8:06 AM 05/03/2022    2:21 PM 07/06/2021    8:20 AM 01/18/2021    8:17 AM 05/03/2020    8:07 AM  PHQ 2/9 Scores  PHQ - 2 Score '1 2 3 '$ 0 '3 1 1  '$ PHQ- 9 Score  15 16 0 17  1    Fall Risk    09/28/2022   10:35 AM 07/11/2022    7:40 AM 05/30/2022    7:37 AM 05/03/2022    2:21 PM 07/06/2021    8:13 AM  [place] in the past year? 0 0 0 0 0  Number falls in past yr: 0 0 0 0 0  Injury with Fall? 0 0 0 0 0  Risk for fall due to : Impaired vision No Fall Risks No Fall Risks No Fall Risks No Fall Risks  Follow up Falls prevention discussed   Falls evaluation completed Falls evaluation completed    [place]:  Any stairs in or around the home? No  If so, are there any without handrails? No  Home free of loose throw rugs in walkways, pet beds, electrical cords, etc? Yes  Adequate lighting  in your home to reduce risk of falls? Yes   ASSISTIVE DEVICES UTILIZED TO PREVENT FALLS:  Life alert? No  Use of a cane, walker or w/c? No  Grab bars in the bathroom? Yes  Shower chair or bench in shower? No  Elevated toilet seat or a handicapped toilet? No   TIMED UP AND GO:  Was the test performed? No .   Cognitive Function:        09/28/2022   10:37 AM  6CIT Screen  What Year? 0 points  What month? 0 points  What time? 0 points  Count back from 20 0 points  Months in reverse 0 points  Repeat phrase 0 points  Total Score 0 points    Immunizations Immunization History  Administered Date(s) Administered   Influenza,inj,Quad PF,6+ Mos 09/07/2014, 07/30/2018   Tdap 09/01/2013    TDAP status: Up to date  Flu Vaccine status: Declined, Education has been provided regarding the importance of this vaccine but patient still declined. Advised may receive this vaccine at local pharmacy or Health Dept. Aware to provide a copy of the vaccination record if obtained from local pharmacy or Health Dept. Verbalized acceptance and understanding.  Pneumococcal vaccine status: Declined,  Education has been provided regarding the importance of this vaccine but patient still declined. Advised may receive this vaccine at local pharmacy or Health Dept. Aware to provide a copy of the vaccination record if obtained from local pharmacy or Health Dept. Verbalized acceptance and understanding.   Covid-19 vaccine status: Declined, Education has been provided regarding the importance of this vaccine but patient still declined. Advised may receive this vaccine at local pharmacy or Health Dept.or vaccine clinic. Aware to provide a copy of the vaccination record if obtained from local pharmacy or Health Dept. Verbalized acceptance and understanding.  Qualifies for Shingles Vaccine? Yes   Zostavax completed No   Shingrix Completed?: No.    Education has been provided regarding the importance of this  vaccine. Patient has been advised to call insurance company to determine out of pocket expense if they have not yet received this vaccine. Advised may also receive vaccine at local pharmacy or Health Dept. Verbalized acceptance and understanding.  Screening Tests Health Maintenance  Topic Date Due   Zoster Vaccines- Shingrix (1 of 2) Never done   INFLUENZA VACCINE  01/30/2023 (Originally 05/02/2022)   MAMMOGRAM  07/12/2023 (Originally 11/30/2017)   COLONOSCOPY (Pts 45-10yr Insurance coverage will need to be confirmed)  07/12/2023 (Originally 10/01/2002)   DTaP/Tdap/Td (2 - Td or Tdap) 09/02/2023   Medicare Annual Wellness (AWV)  09/29/2023   Hepatitis C Screening  Completed   HIV Screening  Completed   HPV VACCINES  Aged Out   COVID-19 Vaccine  Discontinued    Health Maintenance  Health Maintenance Due  Topic Date Due   Zoster Vaccines- Shingrix (1 of 2) Never done    Colorectal cancer screening: No longer required. Per pt preference   Mammogram status: Completed 12/01/15. Repeat every yearpt postponed    Additional  Screening:  Hepatitis C Screening:  Completed 12/07/14  Vision Screening: Recommended annual ophthalmology exams for early detection of glaucoma and other disorders of the eye. Is the patient up to date with their annual eye exam?  Yes  Who is the provider or what is the name of the office in which the patient attends annual eye exams? [facility]  If pt is not established with a provider, would they like to be referred to a provider to establish care? No .   Dental Screening: Recommended annual dental exams for proper oral hygiene  Community Resource Referral / Chronic Care Management: CRR required this visit?  No   CCM required this visit?  No      Plan:     I have personally reviewed and noted the following in the patient's chart:   Medical and social history Use of alcohol, tobacco or illicit drugs  Current medications and supplements including  opioid prescriptions. Patient is not currently taking opioid prescriptions. Functional ability and status Nutritional status Physical activity Advanced directives List of other physicians Hospitalizations, surgeries, and ER visits in previous 12 months Vitals Screenings to include cognitive, depression, and falls Referrals and appointments  In addition, I have reviewed and discussed with patient certain preventive protocols, quality metrics, and best practice recommendations. A written personalized care plan for preventive services as well as general preventive health recommendations were provided to patient.     Willette Brace, LPN   [id number]   Nurse Notes: none

## 2022-09-28 NOTE — Patient Instructions (Signed)
Ms. Debra Holloway , Thank you for taking time to come for your Medicare Wellness Visit. I appreciate your ongoing commitment to your health goals. Please review the following plan we discussed and let me know if I can assist you in the future.   These are the goals we discussed:  Goals      Patient Stated     Would like to be more active & get out of the house more     Patient Stated     Get rid of this cough and go back to the gym        This is a list of the screening recommended for you and due dates:  Health Maintenance  Topic Date Due   Zoster (Shingles) Vaccine (1 of 2) Never done   Flu Shot  01/30/2023*   Mammogram  07/12/2023*   Colon Cancer Screening  07/12/2023*   DTaP/Tdap/Td vaccine (2 - Td or Tdap) 09/02/2023   Medicare Annual Wellness Visit  09/29/2023   Hepatitis C Screening: USPSTF Recommendation to screen - Ages 18-79 yo.  Completed   HIV Screening  Completed   HPV Vaccine  Aged Out   COVID-19 Vaccine  Discontinued  *Topic was postponed. The date shown is not the original due date.    Advanced directives: Please bring a copy of your health care power of attorney and living will to the office at your convenience.  Conditions/risks identified: get rid of this cough and get back to gym   Next appointment: Follow up in one year for your annual wellness visit.    Preventive Care 40-64 Years, Female Preventive care refers to lifestyle choices and visits with your health care provider that can promote health and wellness. What does preventive care include? A yearly physical exam. This is also called an annual well check. Dental exams once or twice a year. Routine eye exams. Ask your health care provider how often you should have your eyes checked. Personal lifestyle choices, including: Daily care of your teeth and gums. Regular physical activity. Eating a healthy diet. Avoiding tobacco and drug use. Limiting alcohol use. Practicing safe sex. Taking low-dose  aspirin daily starting at age 81. Taking vitamin and mineral supplements as recommended by your health care provider. What happens during an annual well check? The services and screenings done by your health care provider during your annual well check will depend on your age, overall health, lifestyle risk factors, and family history of disease. Counseling  Your health care provider may ask you questions about your: Alcohol use. Tobacco use. Drug use. Emotional well-being. Home and relationship well-being. Sexual activity. Eating habits. Work and work Statistician. Method of birth control. Menstrual cycle. Pregnancy history. Screening  You may have the following tests or measurements: Height, weight, and BMI. Blood pressure. Lipid and cholesterol levels. These may be checked every 5 years, or more frequently if you are over 36 years old. Skin check. Lung cancer screening. You may have this screening every year starting at age 57 if you have a 30-pack-year history of smoking and currently smoke or have quit within the past 15 years. Fecal occult blood test (FOBT) of the stool. You may have this test every year starting at age 51. Flexible sigmoidoscopy or colonoscopy. You may have a sigmoidoscopy every 5 years or a colonoscopy every 10 years starting at age 48. Hepatitis C blood test. Hepatitis B blood test. Sexually transmitted disease (STD) testing. Diabetes screening. This is done by checking your blood sugar (  glucose) after you have not eaten for a while (fasting). You may have this done every 1-3 years. Mammogram. This may be done every 1-2 years. Talk to your health care provider about when you should start having regular mammograms. This may depend on whether you have a family history of breast cancer. BRCA-related cancer screening. This may be done if you have a family history of breast, ovarian, tubal, or peritoneal cancers. Pelvic exam and Pap test. This may be done every 3  years starting at age 68. Starting at age 44, this may be done every 5 years if you have a Pap test in combination with an HPV test. Bone density scan. This is done to screen for osteoporosis. You may have this scan if you are at high risk for osteoporosis. Discuss your test results, treatment options, and if necessary, the need for more tests with your health care provider. Vaccines  Your health care provider may recommend certain vaccines, such as: Influenza vaccine. This is recommended every year. Tetanus, diphtheria, and acellular pertussis (Tdap, Td) vaccine. You may need a Td booster every 10 years. Zoster vaccine. You may need this after age 39. Pneumococcal 13-valent conjugate (PCV13) vaccine. You may need this if you have certain conditions and were not previously vaccinated. Pneumococcal polysaccharide (PPSV23) vaccine. You may need one or two doses if you smoke cigarettes or if you have certain conditions. Talk to your health care provider about which screenings and vaccines you need and how often you need them. This information is not intended to replace advice given to you by your health care provider. Make sure you discuss any questions you have with your health care provider. Document Released: 10/15/2015 Document Revised: 06/07/2016 Document Reviewed: 07/20/2015 Elsevier Interactive Patient Education  2017 [place] Prevention in the Home Falls can cause injuries. They can happen to people of all ages. There are many things you can do to make your home safe and to help prevent falls. What can I do on the outside of my home? Regularly fix the edges of walkways and driveways and fix any cracks. Remove anything that might make you trip as you walk through a door, such as a raised step or threshold. Trim any bushes or trees on the path to your home. Use bright outdoor lighting. Clear any walking paths of anything that might make someone trip, such as rocks or  tools. Regularly check to see if handrails are loose or broken. Make sure that both sides of any steps have handrails. Any raised decks and porches should have guardrails on the edges. Have any leaves, snow, or ice cleared regularly. Use sand or salt on walking paths during winter. Clean up any spills in your garage right away. This includes oil or grease spills. What can I do in the bathroom? Use night lights. Install grab bars by the toilet and in the tub and shower. Do not use towel bars as grab bars. Use non-skid mats or decals in the tub or shower. If you need to sit down in the shower, use a plastic, non-slip stool. Keep the floor dry. Clean up any water that spills on the floor as soon as it happens. Remove soap buildup in the tub or shower regularly. Attach bath mats securely with double-sided non-slip rug tape. Do not have throw rugs and other things on the floor that can make you trip. What can I do in the bedroom? Use night lights. Make sure that you have  a light by your bed that is easy to reach. Do not use any sheets or blankets that are too big for your bed. They should not hang down onto the floor. Have a firm chair that has side arms. You can use this for support while you get dressed. Do not have throw rugs and other things on the floor that can make you trip. What can I do in the kitchen? Clean up any spills right away. Avoid walking on wet floors. Keep items that you use a lot in easy-to-reach places. If you need to reach something above you, use a strong step stool that has a grab bar. Keep electrical cords out of the way. Do not use floor polish or wax that makes floors slippery. If you must use wax, use non-skid floor wax. Do not have throw rugs and other things on the floor that can make you trip. What can I do with my stairs? Do not leave any items on the stairs. Make sure that there are handrails on both sides of the stairs and use them. Fix handrails that are  broken or loose. Make sure that handrails are as long as the stairways. Check any carpeting to make sure that it is firmly attached to the stairs. Fix any carpet that is loose or worn. Avoid having throw rugs at the top or bottom of the stairs. If you do have throw rugs, attach them to the floor with carpet tape. Make sure that you have a light switch at the top of the stairs and the bottom of the stairs. If you do not have them, ask someone to add them for you. What else can I do to help prevent falls? Wear shoes that: Do not have high heels. Have rubber bottoms. Are comfortable and fit you well. Are closed at the toe. Do not wear sandals. If you use a stepladder: Make sure that it is fully opened. Do not climb a closed stepladder. Make sure that both sides of the stepladder are locked into place. Ask someone to hold it for you, if possible. Clearly mark and make sure that you can see: Any grab bars or handrails. First and last steps. Where the edge of each step is. Use tools that help you move around (mobility aids) if they are needed. These include: Canes. Walkers. Scooters. Crutches. Turn on the lights when you go into a dark area. Replace any light bulbs as soon as they burn out. Set up your furniture so you have a clear path. Avoid moving your furniture around. If any of your floors are uneven, fix them. If there are any pets around you, be aware of where they are. Review your medicines with your doctor. Some medicines can make you feel dizzy. This can increase your chance of falling. Ask your doctor what other things that you can do to help prevent falls. This information is not intended to replace advice given to you by your health care provider. Make sure you discuss any questions you have with your health care provider. Document Released: 07/15/2009 Document Revised: 02/24/2016 Document Reviewed: 10/23/2014 Elsevier Interactive Patient Education  2017 [employer].

## 2022-09-29 ENCOUNTER — Encounter: Payer: Self-pay | Admitting: Internal Medicine

## 2022-09-29 LAB — CUP PACEART REMOTE DEVICE CHECK
Battery Remaining Longevity: 4 mo
Battery Voltage: 2.79 V
Brady Statistic AP VP Percent: 0.19 %
Brady Statistic AP VS Percent: 0.02 %
Brady Statistic AS VP Percent: 98.59 %
Brady Statistic AS VS Percent: 1.19 %
Brady Statistic RA Percent Paced: 0.21 %
Brady Statistic RV Percent Paced: 98.5 %
Date Time Interrogation Session: 20231229001704
HighPow Impedance: 69 Ohm
Implantable Lead Connection Status: 753985
Implantable Lead Connection Status: 753985
Implantable Lead Connection Status: 753985
Implantable Lead Implant Date: 20161114
Implantable Lead Implant Date: 20161114
Implantable Lead Implant Date: 20161114
Implantable Lead Location: 753858
Implantable Lead Location: 753859
Implantable Lead Location: 753860
Implantable Lead Model: 4598
Implantable Lead Model: 5076
Implantable Pulse Generator Implant Date: 20161114
Lead Channel Impedance Value: 342 Ohm
Lead Channel Impedance Value: 399 Ohm
Lead Channel Impedance Value: 399 Ohm
Lead Channel Impedance Value: 399 Ohm
Lead Channel Impedance Value: 456 Ohm
Lead Channel Impedance Value: 456 Ohm
Lead Channel Impedance Value: 551 Ohm
Lead Channel Impedance Value: 589 Ohm
Lead Channel Impedance Value: 722 Ohm
Lead Channel Impedance Value: 722 Ohm
Lead Channel Impedance Value: 874 Ohm
Lead Channel Impedance Value: 893 Ohm
Lead Channel Impedance Value: 893 Ohm
Lead Channel Pacing Threshold Amplitude: 0.625 V
Lead Channel Pacing Threshold Amplitude: 0.625 V
Lead Channel Pacing Threshold Amplitude: 1 V
Lead Channel Pacing Threshold Pulse Width: 0.4 ms
Lead Channel Pacing Threshold Pulse Width: 0.4 ms
Lead Channel Pacing Threshold Pulse Width: 0.4 ms
Lead Channel Sensing Intrinsic Amplitude: 1.25 mV
Lead Channel Sensing Intrinsic Amplitude: 1.25 mV
Lead Channel Sensing Intrinsic Amplitude: 15.125 mV
Lead Channel Sensing Intrinsic Amplitude: 15.125 mV
Lead Channel Setting Pacing Amplitude: 2 V
Lead Channel Setting Pacing Amplitude: 2.5 V
Lead Channel Setting Pacing Amplitude: 2.5 V
Lead Channel Setting Pacing Pulse Width: 0.4 ms
Lead Channel Setting Pacing Pulse Width: 0.4 ms
Lead Channel Setting Sensing Sensitivity: 0.45 mV
Zone Setting Status: 755011

## 2022-10-30 ENCOUNTER — Ambulatory Visit: Payer: Medicare Other

## 2022-10-30 DIAGNOSIS — I420 Dilated cardiomyopathy: Secondary | ICD-10-CM

## 2022-10-30 DIAGNOSIS — I5032 Chronic diastolic (congestive) heart failure: Secondary | ICD-10-CM

## 2022-10-31 LAB — CUP PACEART REMOTE DEVICE CHECK
Battery Remaining Longevity: 3 mo
Battery Voltage: 2.78 V
Brady Statistic AP VP Percent: 0.14 %
Brady Statistic AP VS Percent: 0.02 %
Brady Statistic AS VP Percent: 98.41 %
Brady Statistic AS VS Percent: 1.43 %
Brady Statistic RA Percent Paced: 0.16 %
Brady Statistic RV Percent Paced: 98.33 %
Date Time Interrogation Session: 20240129012405
HighPow Impedance: 78 Ohm
Implantable Lead Connection Status: 753985
Implantable Lead Connection Status: 753985
Implantable Lead Connection Status: 753985
Implantable Lead Implant Date: 20161114
Implantable Lead Implant Date: 20161114
Implantable Lead Implant Date: 20161114
Implantable Lead Location: 753858
Implantable Lead Location: 753859
Implantable Lead Location: 753860
Implantable Lead Model: 4598
Implantable Lead Model: 5076
Implantable Pulse Generator Implant Date: 20161114
Lead Channel Impedance Value: 1007 Ohm
Lead Channel Impedance Value: 1007 Ohm
Lead Channel Impedance Value: 399 Ohm
Lead Channel Impedance Value: 456 Ohm
Lead Channel Impedance Value: 456 Ohm
Lead Channel Impedance Value: 456 Ohm
Lead Channel Impedance Value: 475 Ohm
Lead Channel Impedance Value: 475 Ohm
Lead Channel Impedance Value: 608 Ohm
Lead Channel Impedance Value: 665 Ohm
Lead Channel Impedance Value: 817 Ohm
Lead Channel Impedance Value: 817 Ohm
Lead Channel Impedance Value: 988 Ohm
Lead Channel Pacing Threshold Amplitude: 0.625 V
Lead Channel Pacing Threshold Amplitude: 0.75 V
Lead Channel Pacing Threshold Amplitude: 1.5 V
Lead Channel Pacing Threshold Pulse Width: 0.4 ms
Lead Channel Pacing Threshold Pulse Width: 0.4 ms
Lead Channel Pacing Threshold Pulse Width: 0.4 ms
Lead Channel Sensing Intrinsic Amplitude: 1.5 mV
Lead Channel Sensing Intrinsic Amplitude: 1.5 mV
Lead Channel Sensing Intrinsic Amplitude: 25.25 mV
Lead Channel Sensing Intrinsic Amplitude: 25.25 mV
Lead Channel Setting Pacing Amplitude: 2 V
Lead Channel Setting Pacing Amplitude: 2.5 V
Lead Channel Setting Pacing Amplitude: 3 V
Lead Channel Setting Pacing Pulse Width: 0.4 ms
Lead Channel Setting Pacing Pulse Width: 0.4 ms
Lead Channel Setting Sensing Sensitivity: 0.45 mV
Zone Setting Status: 755011

## 2022-11-01 ENCOUNTER — Encounter: Payer: Self-pay | Admitting: Family Medicine

## 2022-11-01 ENCOUNTER — Encounter: Payer: Self-pay | Admitting: Internal Medicine

## 2022-11-01 DIAGNOSIS — F988 Other specified behavioral and emotional disorders with onset usually occurring in childhood and adolescence: Secondary | ICD-10-CM

## 2022-11-02 MED ORDER — LISDEXAMFETAMINE DIMESYLATE 50 MG PO CAPS: 50.0000 mg | ORAL_CAPSULE | Freq: Every day | ORAL | 0 refills | Status: DC

## 2022-11-03 NOTE — Telephone Encounter (Signed)
Has she checked with insurance? We can complete a prior auth if needed.  Algis Greenhouse. Jerline Pain, MD 11/03/2022 10:01 AM

## 2022-11-03 NOTE — Telephone Encounter (Signed)
Please advise 

## 2022-11-07 ENCOUNTER — Telehealth: Payer: Self-pay

## 2022-11-07 ENCOUNTER — Encounter: Payer: Medicare Other | Admitting: Internal Medicine

## 2022-11-07 NOTE — Telephone Encounter (Signed)
Unscheduled/Pt initiated manual transmission received. Presenting rhythm AsBiV paced with rates 130bpm. Optivol normal. Routing for further review- JJB  Patient sent transmission yesterday when on phone with [employer] due to troubleshooting her remote device.  In review of the transmission we have the following concerns: Ongoing elevations in heart rates LV threshold inability to measure for 7 days.  Dr. Caryl Comes also considering whether PMT is at play here.  4.  Ventricular sensing episodes.   In review with Dr. Caryl Comes, he would like patient to come in and see him tomorrow at 1130am to address.  Spoke with patient, she said that she could tell her heart rates have been faster and agrees with appt.

## 2022-11-08 ENCOUNTER — Ambulatory Visit: Payer: Medicare Other | Attending: Internal Medicine | Admitting: Internal Medicine

## 2022-11-08 ENCOUNTER — Encounter: Payer: Self-pay | Admitting: Internal Medicine

## 2022-11-08 VITALS — BP 132/86 | HR 75 | Ht 65.5 in | Wt 230.6 lb

## 2022-11-08 DIAGNOSIS — I509 Heart failure, unspecified: Secondary | ICD-10-CM

## 2022-11-08 DIAGNOSIS — I420 Dilated cardiomyopathy: Secondary | ICD-10-CM

## 2022-11-08 DIAGNOSIS — I447 Left bundle-branch block, unspecified: Secondary | ICD-10-CM | POA: Diagnosis not present

## 2022-11-08 DIAGNOSIS — Z95810 Presence of automatic (implantable) cardiac defibrillator: Secondary | ICD-10-CM | POA: Diagnosis not present

## 2022-11-08 LAB — CUP PACEART INCLINIC DEVICE CHECK
Battery Remaining Longevity: 3 mo
Battery Voltage: 2.77 V
Brady Statistic AP VP Percent: 0.16 %
Brady Statistic AP VS Percent: 0.02 %
Brady Statistic AS VP Percent: 98.5 %
Brady Statistic AS VS Percent: 1.32 %
Brady Statistic RA Percent Paced: 0.19 %
Brady Statistic RV Percent Paced: 98.4 %
Date Time Interrogation Session: 20240207200805
HighPow Impedance: 83 Ohm
Implantable Lead Connection Status: 753985
Implantable Lead Connection Status: 753985
Implantable Lead Connection Status: 753985
Implantable Lead Implant Date: 20161114
Implantable Lead Implant Date: 20161114
Implantable Lead Implant Date: 20161114
Implantable Lead Location: 753858
Implantable Lead Location: 753859
Implantable Lead Location: 753860
Implantable Lead Model: 4598
Implantable Lead Model: 5076
Implantable Pulse Generator Implant Date: 20161114
Lead Channel Impedance Value: 1026 Ohm
Lead Channel Impedance Value: 1083 Ohm
Lead Channel Impedance Value: 418 Ohm
Lead Channel Impedance Value: 456 Ohm
Lead Channel Impedance Value: 475 Ohm
Lead Channel Impedance Value: 513 Ohm
Lead Channel Impedance Value: 532 Ohm
Lead Channel Impedance Value: 551 Ohm
Lead Channel Impedance Value: 608 Ohm
Lead Channel Impedance Value: 665 Ohm
Lead Channel Impedance Value: 950 Ohm
Lead Channel Impedance Value: 950 Ohm
Lead Channel Impedance Value: 988 Ohm
Lead Channel Pacing Threshold Amplitude: 0.5 V
Lead Channel Pacing Threshold Amplitude: 0.5 V
Lead Channel Pacing Threshold Amplitude: 0.625 V
Lead Channel Pacing Threshold Amplitude: 0.625 V
Lead Channel Pacing Threshold Amplitude: 1.25 V
Lead Channel Pacing Threshold Amplitude: 1.5 V
Lead Channel Pacing Threshold Pulse Width: 0.4 ms
Lead Channel Pacing Threshold Pulse Width: 0.4 ms
Lead Channel Pacing Threshold Pulse Width: 0.4 ms
Lead Channel Pacing Threshold Pulse Width: 0.4 ms
Lead Channel Pacing Threshold Pulse Width: 0.4 ms
Lead Channel Pacing Threshold Pulse Width: 0.4 ms
Lead Channel Sensing Intrinsic Amplitude: 1.25 mV
Lead Channel Sensing Intrinsic Amplitude: 15.375 mV
Lead Channel Sensing Intrinsic Amplitude: 2 mV
Lead Channel Sensing Intrinsic Amplitude: 24.75 mV
Lead Channel Setting Pacing Amplitude: 2 V
Lead Channel Setting Pacing Amplitude: 2 V
Lead Channel Setting Pacing Amplitude: 2 V
Lead Channel Setting Pacing Pulse Width: 0.4 ms
Lead Channel Setting Pacing Pulse Width: 0.8 ms
Lead Channel Setting Sensing Sensitivity: 0.45 mV
Zone Setting Status: 755011

## 2022-11-08 NOTE — Patient Instructions (Addendum)
Thank you for coming in today.  We have made a minor change to your programming on your device to best support your cardiac output and function. If you have any questions or concerns following your visit today, please call us in the device clinic at [phone].  Your battery is nearing replacement.  We will call you once it hits replacement mark to set up your consult for a change out.

## 2022-11-08 NOTE — Progress Notes (Signed)
Seen today because of tachycardia noted on her device with P synchronous pacing near her the upper rate limit. Not sure whether this represented pacemaker mediated tachycardia or atrial tachycardia.  We reprogrammed the PVARP as well as decrease the upper tracking rate to try to avoid this.  BP 132/86   Pulse 75   Ht 5' 5.5" (1.664 m)   Wt 230 lb 9.6 oz (104.6 kg)   SpO2 97%   BMI 37.79 kg/m  Clear RRR

## 2022-11-30 ENCOUNTER — Ambulatory Visit (INDEPENDENT_AMBULATORY_CARE_PROVIDER_SITE_OTHER): Payer: Medicare Other

## 2022-11-30 DIAGNOSIS — I447 Left bundle-branch block, unspecified: Secondary | ICD-10-CM | POA: Diagnosis not present

## 2022-11-30 LAB — CUP PACEART REMOTE DEVICE CHECK
Battery Remaining Longevity: 2 mo
Battery Voltage: 2.76 V
Brady Statistic AP VP Percent: 0.35 %
Brady Statistic AP VS Percent: 0.02 %
Brady Statistic AS VP Percent: 96.42 %
Brady Statistic AS VS Percent: 3.21 %
Brady Statistic RA Percent Paced: 0.36 %
Brady Statistic RV Percent Paced: 92.69 %
Date Time Interrogation Session: 20240229012305
HighPow Impedance: 74 Ohm
Implantable Lead Connection Status: 753985
Implantable Lead Connection Status: 753985
Implantable Lead Connection Status: 753985
Implantable Lead Implant Date: 20161114
Implantable Lead Implant Date: 20161114
Implantable Lead Implant Date: 20161114
Implantable Lead Location: 753858
Implantable Lead Location: 753859
Implantable Lead Location: 753860
Implantable Lead Model: 4598
Implantable Lead Model: 5076
Implantable Pulse Generator Implant Date: 20161114
Lead Channel Impedance Value: 342 Ohm
Lead Channel Impedance Value: 418 Ohm
Lead Channel Impedance Value: 418 Ohm
Lead Channel Impedance Value: 456 Ohm
Lead Channel Impedance Value: 475 Ohm
Lead Channel Impedance Value: 475 Ohm
Lead Channel Impedance Value: 551 Ohm
Lead Channel Impedance Value: 608 Ohm
Lead Channel Impedance Value: 722 Ohm
Lead Channel Impedance Value: 760 Ohm
Lead Channel Impedance Value: 874 Ohm
Lead Channel Impedance Value: 893 Ohm
Lead Channel Impedance Value: 931 Ohm
Lead Channel Pacing Threshold Amplitude: 0.625 V
Lead Channel Pacing Threshold Amplitude: 0.75 V
Lead Channel Pacing Threshold Amplitude: 1.5 V
Lead Channel Pacing Threshold Pulse Width: 0.4 ms
Lead Channel Pacing Threshold Pulse Width: 0.4 ms
Lead Channel Pacing Threshold Pulse Width: 0.4 ms
Lead Channel Sensing Intrinsic Amplitude: 2.125 mV
Lead Channel Sensing Intrinsic Amplitude: 2.125 mV
Lead Channel Sensing Intrinsic Amplitude: 21.75 mV
Lead Channel Sensing Intrinsic Amplitude: 21.75 mV
Lead Channel Setting Pacing Amplitude: 2 V
Lead Channel Setting Pacing Amplitude: 2 V
Lead Channel Setting Pacing Amplitude: 2.5 V
Lead Channel Setting Pacing Pulse Width: 0.4 ms
Lead Channel Setting Pacing Pulse Width: 0.8 ms
Lead Channel Setting Sensing Sensitivity: 0.45 mV
Zone Setting Status: 755011

## 2022-12-01 ENCOUNTER — Encounter: Payer: Self-pay | Admitting: Internal Medicine

## 2022-12-11 ENCOUNTER — Other Ambulatory Visit: Payer: Self-pay

## 2022-12-11 ENCOUNTER — Encounter: Payer: Self-pay | Admitting: Family Medicine

## 2022-12-11 DIAGNOSIS — F988 Other specified behavioral and emotional disorders with onset usually occurring in childhood and adolescence: Secondary | ICD-10-CM

## 2022-12-11 MED ORDER — LISDEXAMFETAMINE DIMESYLATE 50 MG PO CAPS: 50.0000 mg | ORAL_CAPSULE | Freq: Every day | ORAL | 0 refills | Status: DC

## 2022-12-11 NOTE — Telephone Encounter (Signed)
Last refill: 11/02/22 #30, 0 Last OV: 07/11/22 dx. CPE

## 2022-12-15 NOTE — Addendum Note (Signed)
Addended by: Douglass Rivers D on: 12/15/2022 10:28 AM   Modules accepted: Level of Service

## 2022-12-15 NOTE — Progress Notes (Signed)
Remote ICD transmission.   

## 2022-12-29 ENCOUNTER — Ambulatory Visit (INDEPENDENT_AMBULATORY_CARE_PROVIDER_SITE_OTHER): Payer: Medicare Other

## 2022-12-29 DIAGNOSIS — I447 Left bundle-branch block, unspecified: Secondary | ICD-10-CM

## 2022-12-29 LAB — CUP PACEART REMOTE DEVICE CHECK
Battery Remaining Longevity: 2 mo
Battery Voltage: 2.74 V
Brady Statistic AP VP Percent: 0.3 %
Brady Statistic AP VS Percent: 0.03 %
Brady Statistic AS VP Percent: 96.36 %
Brady Statistic AS VS Percent: 3.32 %
Brady Statistic RA Percent Paced: 0.31 %
Brady Statistic RV Percent Paced: 92.02 %
Date Time Interrogation Session: 20240329073823
HighPow Impedance: 78 Ohm
Implantable Lead Connection Status: 753985
Implantable Lead Connection Status: 753985
Implantable Lead Connection Status: 753985
Implantable Lead Implant Date: 20161114
Implantable Lead Implant Date: 20161114
Implantable Lead Implant Date: 20161114
Implantable Lead Location: 753858
Implantable Lead Location: 753859
Implantable Lead Location: 753860
Implantable Lead Model: 4598
Implantable Lead Model: 5076
Implantable Pulse Generator Implant Date: 20161114
Lead Channel Impedance Value: 342 Ohm
Lead Channel Impedance Value: 418 Ohm
Lead Channel Impedance Value: 456 Ohm
Lead Channel Impedance Value: 475 Ohm
Lead Channel Impedance Value: 475 Ohm
Lead Channel Impedance Value: 513 Ohm
Lead Channel Impedance Value: 589 Ohm
Lead Channel Impedance Value: 608 Ohm
Lead Channel Impedance Value: 817 Ohm
Lead Channel Impedance Value: 836 Ohm
Lead Channel Impedance Value: 931 Ohm
Lead Channel Impedance Value: 931 Ohm
Lead Channel Impedance Value: 950 Ohm
Lead Channel Pacing Threshold Amplitude: 0.625 V
Lead Channel Pacing Threshold Amplitude: 0.625 V
Lead Channel Pacing Threshold Amplitude: 1 V
Lead Channel Pacing Threshold Pulse Width: 0.4 ms
Lead Channel Pacing Threshold Pulse Width: 0.4 ms
Lead Channel Pacing Threshold Pulse Width: 0.8 ms
Lead Channel Sensing Intrinsic Amplitude: 2.25 mV
Lead Channel Sensing Intrinsic Amplitude: 2.25 mV
Lead Channel Sensing Intrinsic Amplitude: 21.875 mV
Lead Channel Sensing Intrinsic Amplitude: 21.875 mV
Lead Channel Setting Pacing Amplitude: 2 V
Lead Channel Setting Pacing Amplitude: 2.5 V
Lead Channel Setting Pacing Amplitude: 2.5 V
Lead Channel Setting Pacing Pulse Width: 0.4 ms
Lead Channel Setting Pacing Pulse Width: 0.8 ms
Lead Channel Setting Sensing Sensitivity: 0.45 mV
Zone Setting Status: 755011

## 2022-12-29 NOTE — Progress Notes (Signed)
Remote ICD transmission.   

## 2023-01-02 ENCOUNTER — Telehealth: Payer: Self-pay

## 2023-01-02 DIAGNOSIS — H2513 Age-related nuclear cataract, bilateral: Secondary | ICD-10-CM | POA: Diagnosis not present

## 2023-01-02 DIAGNOSIS — H43813 Vitreous degeneration, bilateral: Secondary | ICD-10-CM | POA: Diagnosis not present

## 2023-01-02 DIAGNOSIS — H43393 Other vitreous opacities, bilateral: Secondary | ICD-10-CM | POA: Diagnosis not present

## 2023-01-02 DIAGNOSIS — H33193 Other retinoschisis and retinal cysts, bilateral: Secondary | ICD-10-CM | POA: Diagnosis not present

## 2023-01-02 DIAGNOSIS — H35033 Hypertensive retinopathy, bilateral: Secondary | ICD-10-CM | POA: Diagnosis not present

## 2023-01-02 NOTE — Telephone Encounter (Signed)
Following alert received from [employer] received for Device alert for RRT reached 4/2.  Message sent to patient via mychart.   Routing to scheduling.

## 2023-01-04 NOTE — Telephone Encounter (Signed)
Pt called in crying and worried about her battery going off. Informed her message was sent over to scheduler and she should be calling soon.

## 2023-01-04 NOTE — Telephone Encounter (Signed)
Spoke to patient, advised device will alarm due to battery at RRT. Offered for pt to come in to turn alarm off or she may wait to have alarm programmed off when she comes in to discuss gen change. Patient is ok to wait. Appreciative to call.

## 2023-01-05 NOTE — Telephone Encounter (Signed)
Pt is scheduled for 01/08/2023 with Francis Dowse, PA-C to update, H&P, EKG, labs and turn off alarm.

## 2023-01-07 NOTE — Progress Notes (Unsigned)
Cardiology Office Note Date:  01/07/2023  Patient ID:  Holloway, Debra June 29, 1956, MRN [id number] PCP:  Ardith Dark, MD  Electrophysiologist: Dr. Graciela Husbands  ***refresh   Chief Complaint: *** ERI  History of Present Illness: Debra Holloway is a 67 y.o. female with history of anxiety, OSA (untreated), LBBB, NICM, ICD  Saw Dr. Graciela Husbands 07/10/22 c/o some orthostatic lightheadedness, fatigue, mentioned SCAF.  Coreg stopped as possible source of her fatigue, discussed marked depression.  Planned for echo, CTA, and 3 days of lasix  Echo with LVEF 45-50% and coreg resumed, noting holding it did not improved her fatigue  Brief note by Dr. Graciela Husbands 11/08/22, with device reprogramming 2/2 P synchronous pacing +/- PMT and PVARP adjusted.  *** symptoms *** volume *** meds, CN *** ERI/RRT *** AF? *** BP %    Device information MDT CRT-D implanted August 28, 2015   Past Medical History:  Diagnosis Date   ADD (attention deficit disorder)    AICD (automatic cardioverter/defibrillator) present 28-Aug-2015   Anxiety    "just when my daddy died a few years ago" (08/28/2015)   AR (allergic rhinitis)    Chicken pox    Chronic combined systolic and diastolic CHF (congestive heart failure) (HCC)    Depression    "just when my daddy died a few years ago" (08-28-15)   Dyslipidemia    Environmental allergies    H/O vitamin D deficiency    LBBB (left bundle branch block)    W/1st degree AV Block, Avoid Beta Blockers   NICM (nonischemic cardiomyopathy) (HCC)    OSA (obstructive sleep apnea) 03/18/2015   Severe OSA with AHI 30/hr; "suppose to wear mask but don't" (08-28-2015)   Seasonal affective disorder (HCC)     Past Surgical History:  Procedure Laterality Date   CARDIAC CATHETERIZATION     DILATION AND CURETTAGE OF UTERUS  2003   EP IMPLANTABLE DEVICE N/A 28-Aug-2015   Procedure: BiV ICD Insertion CRT-D;  Surgeon: Duke Salvia, MD;  Location: [facility] INVASIVE CV LAB;  Service: Cardiovascular;   Laterality: N/A;   LEFT AND RIGHT HEART CATHETERIZATION WITH CORONARY ANGIOGRAM N/A 11/17/2014   Procedure: LEFT AND RIGHT HEART CATHETERIZATION WITH CORONARY ANGIOGRAM;  Surgeon: Lennette Bihari, MD;  Location: [facility] CATH LAB;  Service: Cardiovascular;  Laterality: N/A;   TOTAL ABDOMINAL HYSTERECTOMY  2004   TUBAL LIGATION     WISDOM TOOTH EXTRACTION  1986    Current Outpatient Medications  Medication Sig Dispense Refill   b complex vitamins tablet Take 1 tablet by mouth daily.     buPROPion (WELLBUTRIN XL) 300 MG 24 hr tablet Take 1 tablet (300 mg total) by mouth daily. 90 tablet 1   carvedilol (COREG) 6.25 MG tablet Take 1 tablet (6.25 mg total) by mouth 2 (two) times daily. 180 tablet 3   Cholecalciferol (VITAMIN D PO) Take 2,000 mg by mouth daily.      fexofenadine (ALLEGRA) 180 MG tablet Take 180 mg by mouth daily.     fluticasone (FLONASE) 50 MCG/ACT nasal spray Place 1 spray into both nostrils daily.     furosemide (LASIX) 40 MG tablet Take 1 tablet (40 mg total) by mouth daily. 90 tablet 3   lisdexamfetamine (VYVANSE) 50 MG capsule Take 1 capsule (50 mg total) by mouth daily. 30 capsule 0   lisinopril (ZESTRIL) 5 MG tablet Take 1 tablet (5 mg total) by mouth daily. (Patient not taking: Reported on 09/28/2022) 90 tablet 3   potassium chloride (  KLOR-CON) 10 MEQ tablet Take 1 tablet (10 mEq total) by mouth daily. 90 tablet 3   spironolactone (ALDACTONE) 25 MG tablet Take 1 tablet (25 mg total) by mouth daily. 45 tablet 3   No current facility-administered medications for this visit.    Allergies:   Bee venom, Codeine, Losartan, Erythromycin, Lovastatin, Nickel, Penicillins, and Simvastatin   Social History:  The patient  reports that she has never smoked. She has never used smokeless tobacco. She reports current alcohol use of about 4.0 standard drinks of alcohol per week. She reports that she does not use drugs.   Family History:  The patient's family history includes Alzheimer's  disease in her maternal grandfather; Arthritis/Rheumatoid in her maternal grandmother; Breast cancer (age of onset: 69) in her sister; COPD in her mother; Cancer in her paternal uncle; Healthy in her brother and daughter; Heart disease in her maternal grandmother and paternal grandmother; Hypertension in her paternal grandmother; Lung cancer in her paternal grandfather; Pancreatic cancer (age of onset: 67) in her father.  ROS:  Please see the history of present illness.    All other systems are reviewed and otherwise negative.   PHYSICAL EXAM:  VS:  There were no vitals taken for this visit. BMI: There is no height or weight on file to calculate BMI. Well nourished, well developed, in no acute distress HEENT: normocephalic, atraumatic Neck: no JVD, carotid bruits or masses Cardiac:  *** RRR; no significant murmurs, no rubs, or gallops Lungs:  *** CTA b/l, no wheezing, rhonchi or rales Abd: soft, nontender MS: no deformity or *** atrophy Ext: *** no edema Skin: warm and dry, no rash Neuro:  No gross deficits appreciated Psych: euthymic mood, full affect  *** ICD site is stable, no tethering or discomfort   EKG:  Done today and reviewed by myself shows  ***  Device interrogation done today and reviewed by myself:  ***   07/27/22: Coronary CTa IMPRESSION: 1. Coronary calcium score of 0. This was 1st percentile for age-, race-, and sex-matched controls. 2. Normal coronary origin with right dominance. 3. No evidence of CAD.  CAD-RADS 0. 4.  Consider nonischemic causes of fatigue.   07/27/22: TTE 1. Left ventricular ejection fraction, by estimation, is 45 to 50%. The  left ventricle has mildly decreased function. The left ventricle  demonstrates global hypokinesis. Left ventricular diastolic parameters are  consistent with Grade I diastolic  dysfunction (impaired relaxation).   2. Right ventricular systolic function is normal. The right ventricular  size is normal. There is  normal pulmonary artery systolic pressure. The  estimated right ventricular systolic pressure is 25.5 mmHg.   3. Left atrial size was mildly dilated.   4. The mitral valve is normal in structure. No evidence of mitral valve  regurgitation. No evidence of mitral stenosis.   5. The aortic valve is tricuspid. Aortic valve regurgitation is not  visualized. No aortic stenosis is present.   6. The inferior vena cava is normal in size with greater than 50%  respiratory variability, suggesting right atrial pressure of 3 mmHg.    Recent Labs: 07/11/2022: ALT 38; BUN 17; Creatinine, Ser 0.89; Hemoglobin 13.2; Platelets 248.0; Potassium 4.3; Sodium 140; TSH 3.50  07/11/2022: Cholesterol 227; HDL 70.40; LDL Cholesterol 136; Total CHOL/HDL Ratio 3; Triglycerides 106.0; VLDL 21.2   CrCl cannot be calculated (Patient's most recent lab result is older than the maximum 21 days allowed.).   Wt Readings from Last 3 Encounters:  11/08/22 230 lb 9.6 oz (  104.6 kg)  09/28/22 239 lb (108.4 kg)  09/19/22 231 lb 4 oz (104.9 kg)     Other studies reviewed: Additional studies/records reviewed today include: summarized above  ASSESSMENT AND PLAN:  ICD ***  NICM Chronic CHF ***  Disposition: F/u with ***  Current medicines are reviewed at length with the patient today.  The patient did not have any concerns regarding medicines.  Norma Fredrickson, PA-C 01/07/2023 9:05 AM     [facility] [address] [place] [address] [phone] (office)  [phone] (fax)

## 2023-01-07 NOTE — H&P (View-Only) (Signed)
Cardiology Office Note Date:  01/07/2023  Patient ID:  Debra Holloway 1956-05-14, [name] PCP:  Ardith Dark, MD  Electrophysiologist: Dr. Graciela Husbands    Chief Complaint:  ERI  History of Present Illness: Debra Holloway is a 67 y.o. female with history of anxiety, OSA (untreated), LBBB, NICM, ICD  Saw Dr. Graciela Husbands 07/10/22 c/o some orthostatic lightheadedness, fatigue, mentioned SCAF.  Coreg stopped as possible source of her fatigue, discussed marked depression.  Planned for echo, CTA, and 3 days of lasix  Echo with LVEF 45-50% and coreg resumed, noting holding it did not improved her fatigue  Brief note by Dr. Graciela Husbands 11/08/22, with device reprogramming 2/2 P synchronous pacing +/- PMT and PVARP adjusted.  TODAY She is tired Winter especially is hard on her, she remarks she thinks she has seasonal affective disorder.  Once summer coes around gets a little better for her. She had been sick with COVID late fall and then an URI/another viral syndrome a couple months ao and has not yet felt like she has gotten back to her usual. She does go to the gym with a firen, walks the treadmill, doing about . Otherwise she has no energy to do anything, naps all of the time. No CP or cardiac awareness No near syncope or syncope.   Device information MDT CRT-D implanted 09-04-15   Past Medical History:  Diagnosis Date   ADD (attention deficit disorder)    AICD (automatic cardioverter/defibrillator) present 09/04/15   Anxiety    "just when my daddy died a few years ago" (09/04/2015)   AR (allergic rhinitis)    Chicken pox    Chronic combined systolic and diastolic CHF (congestive heart failure) (HCC)    Depression    "just when my daddy died a few years ago" (2015/09/04)   Dyslipidemia    Environmental allergies    H/O vitamin D deficiency    LBBB (left bundle branch block)    W/1st degree AV Block, Avoid Beta Blockers   NICM (nonischemic cardiomyopathy) (HCC)    OSA  (obstructive sleep apnea) 03/18/2015   Severe OSA with AHI 30/hr; "suppose to wear mask but don't" (Sep 04, 2015)   Seasonal affective disorder (HCC)     Past Surgical History:  Procedure Laterality Date   CARDIAC CATHETERIZATION     DILATION AND CURETTAGE OF UTERUS  2003   EP IMPLANTABLE DEVICE N/A 04-Sep-2015   Procedure: BiV ICD Insertion CRT-D;  Surgeon: Duke Salvia, MD;  Location: [facility] INVASIVE CV LAB;  Service: Cardiovascular;  Laterality: N/A;   LEFT AND RIGHT HEART CATHETERIZATION WITH CORONARY ANGIOGRAM N/A 11/17/2014   Procedure: LEFT AND RIGHT HEART CATHETERIZATION WITH CORONARY ANGIOGRAM;  Surgeon: Lennette Bihari, MD;  Location: [facility] CATH LAB;  Service: Cardiovascular;  Laterality: N/A;   TOTAL ABDOMINAL HYSTERECTOMY  2004   TUBAL LIGATION     WISDOM TOOTH EXTRACTION  1986    Current Outpatient Medications  Medication Sig Dispense Refill   b complex vitamins tablet Take 1 tablet by mouth daily.     buPROPion (WELLBUTRIN XL) 300 MG 24 hr tablet Take 1 tablet (300 mg total) by mouth daily. 90 tablet 1   carvedilol (COREG) 6.25 MG tablet Take 1 tablet (6.25 mg total) by mouth 2 (two) times daily. 180 tablet 3   Cholecalciferol (VITAMIN D PO) Take 2,000 mg by mouth daily.      fexofenadine (ALLEGRA) 180 MG tablet Take 180 mg by mouth daily.  fluticasone (FLONASE) 50 MCG/ACT nasal spray Place 1 spray into both nostrils daily.     furosemide (LASIX) 40 MG tablet Take 1 tablet (40 mg total) by mouth daily. 90 tablet 3   lisdexamfetamine (VYVANSE) 50 MG capsule Take 1 capsule (50 mg total) by mouth daily. 30 capsule 0   lisinopril (ZESTRIL) 5 MG tablet Take 1 tablet (5 mg total) by mouth daily. (Patient not taking: Reported on 09/28/2022) 90 tablet 3   potassium chloride (KLOR-CON) 10 MEQ tablet Take 1 tablet (10 mEq total) by mouth daily. 90 tablet 3   spironolactone (ALDACTONE) 25 MG tablet Take 1 tablet (25 mg total) by mouth daily. 45 tablet 3   No current facility-administered  medications for this visit.    Allergies:   Bee venom, Codeine, Losartan, Erythromycin, Lovastatin, Nickel, Penicillins, and Simvastatin   Social History:  The patient  reports that she has never smoked. She has never used smokeless tobacco. She reports current alcohol use of about 4.0 standard drinks of alcohol per week. She reports that she does not use drugs.   Family History:  The patient's family history includes Alzheimer's disease in her maternal grandfather; Arthritis/Rheumatoid in her maternal grandmother; Breast cancer (age of onset: 5938) in her sister; COPD in her mother; Cancer in her paternal uncle; Healthy in her brother and daughter; Heart disease in her maternal grandmother and paternal grandmother; Hypertension in her paternal grandmother; Lung cancer in her paternal grandfather; Pancreatic cancer (age of onset: 8258) in her father.  ROS:  Please see the history of present illness.    All other systems are reviewed and otherwise negative.   PHYSICAL EXAM:  VS:  There were no vitals taken for this visit. BMI: There is no height or weight on file to calculate BMI. Well nourished, well developed, in no acute distress HEENT: normocephalic, atraumatic Neck: no JVD, carotid bruits or masses Cardiac:  RRR; no significant murmurs, no rubs, or gallops Lungs:   CTA b/l, no wheezing, rhonchi or rales Abd: soft, nontender MS: no deformity or atrophy Ext: no edema Skin: warm and dry, no rash Neuro:  No gross deficits appreciated Psych: euthymic mood, full affect  ICD site is stable, no tethering or discomfort   EKG:  Done today and reviewed by myself shows  AS/VP 75bpm  Device interrogation done today and reviewed by myself:  Battery reached RRT 01/02/23 Lead measurements are good No arrhythmias   07/27/22: Coronary CTa IMPRESSION: 1. Coronary calcium score of 0. This was 1st percentile for age-, race-, and sex-matched controls. 2. Normal coronary origin with right  dominance. 3. No evidence of CAD.  CAD-RADS 0. 4.  Consider nonischemic causes of fatigue.   07/27/22: TTE 1. Left ventricular ejection fraction, by estimation, is 45 to 50%. The  left ventricle has mildly decreased function. The left ventricle  demonstrates global hypokinesis. Left ventricular diastolic parameters are  consistent with Grade I diastolic  dysfunction (impaired relaxation).   2. Right ventricular systolic function is normal. The right ventricular  size is normal. There is normal pulmonary artery systolic pressure. The  estimated right ventricular systolic pressure is 25.5 mmHg.   3. Left atrial size was mildly dilated.   4. The mitral valve is normal in structure. No evidence of mitral valve  regurgitation. No evidence of mitral stenosis.   5. The aortic valve is tricuspid. Aortic valve regurgitation is not  visualized. No aortic stenosis is present.   6. The inferior vena cava is normal in  size with greater than 50%  respiratory variability, suggesting right atrial pressure of 3 mmHg.    Recent Labs: 07/11/2022: ALT 38; BUN 17; Creatinine, Ser 0.89; Hemoglobin 13.2; Platelets 248.0; Potassium 4.3; Sodium 140; TSH 3.50  07/11/2022: Cholesterol 227; HDL 70.40; LDL Cholesterol 136; Total CHOL/HDL Ratio 3; Triglycerides 106.0; VLDL 21.2   CrCl cannot be calculated (Patient's most recent lab result is older than the maximum 21 days allowed.).   Wt Readings from Last 3 Encounters:  11/08/22 230 lb 9.6 oz (104.6 kg)  09/28/22 239 lb (108.4 kg)  09/19/22 231 lb 4 oz (104.9 kg)     Other studies reviewed: Additional studies/records reviewed today include: summarized above  ASSESSMENT AND PLAN:  ICD RRT  We discussed the procedure, potential risks/benefits She immediately get emotional recalling how anxiety provoking the implant procedure was, says Dr. Graciela Husbands told her he could not safely give her more sedation, shwe hopes it goes better She has a few trips coming up  and hopes to get it done ASAP Also hopes he uses dermabond again so she can shower  NICM Chronic CHF No symptoms or exam findings of volume OL OptiVol is good On BB/ACE, diuretic tx Last echo 45-50%  4. Significant fatigue. I suspect mostly 2/2 untreated apnea At the mere mention of repeat sleep test, treatment of her sleep apnea, she gets visibly anxious, and reports she has tried different mask styles and just can not tolerate anything on her face, not willing to consider eval for implantable device.    Disposition: F/u with usual post procedure appointments  Current medicines are reviewed at length with the patient today.  The patient did not have any concerns regarding medicines.  Norma Fredrickson, PA-C 01/07/2023 9:05 AM     [facility] [address] [place] [address] [phone] (office)  [phone] (fax)

## 2023-01-08 ENCOUNTER — Encounter: Payer: Self-pay | Admitting: Physician Assistant

## 2023-01-08 ENCOUNTER — Ambulatory Visit: Payer: Medicare Other | Attending: Physician Assistant | Admitting: Physician Assistant

## 2023-01-08 ENCOUNTER — Encounter: Payer: Self-pay | Admitting: Internal Medicine

## 2023-01-08 VITALS — BP 126/84 | HR 75 | Ht 65.5 in | Wt 231.4 lb

## 2023-01-08 DIAGNOSIS — Z95810 Presence of automatic (implantable) cardiac defibrillator: Secondary | ICD-10-CM

## 2023-01-08 DIAGNOSIS — I428 Other cardiomyopathies: Secondary | ICD-10-CM | POA: Diagnosis not present

## 2023-01-08 DIAGNOSIS — I420 Dilated cardiomyopathy: Secondary | ICD-10-CM | POA: Diagnosis not present

## 2023-01-08 DIAGNOSIS — I5022 Chronic systolic (congestive) heart failure: Secondary | ICD-10-CM | POA: Diagnosis not present

## 2023-01-08 LAB — CUP PACEART INCLINIC DEVICE CHECK
Battery Remaining Longevity: 1 mo
Battery Voltage: 2.71 V
Brady Statistic AP VP Percent: 0.33 %
Brady Statistic AP VS Percent: 0.03 %
Brady Statistic AS VP Percent: 96.47 %
Brady Statistic AS VS Percent: 3.17 %
Brady Statistic RA Percent Paced: 0.34 %
Brady Statistic RV Percent Paced: 92.36 %
Date Time Interrogation Session: 20240408165526
HighPow Impedance: 86 Ohm
Implantable Lead Connection Status: 753985
Implantable Lead Connection Status: 753985
Implantable Lead Connection Status: 753985
Implantable Lead Implant Date: 20161114
Implantable Lead Implant Date: 20161114
Implantable Lead Implant Date: 20161114
Implantable Lead Location: 753858
Implantable Lead Location: 753859
Implantable Lead Location: 753860
Implantable Lead Model: 4598
Implantable Lead Model: 5076
Implantable Pulse Generator Implant Date: 20161114
Lead Channel Impedance Value: 418 Ohm
Lead Channel Impedance Value: 456 Ohm
Lead Channel Impedance Value: 456 Ohm
Lead Channel Impedance Value: 513 Ohm
Lead Channel Impedance Value: 513 Ohm
Lead Channel Impedance Value: 513 Ohm
Lead Channel Impedance Value: 589 Ohm
Lead Channel Impedance Value: 665 Ohm
Lead Channel Impedance Value: 817 Ohm
Lead Channel Impedance Value: 874 Ohm
Lead Channel Impedance Value: 893 Ohm
Lead Channel Impedance Value: 950 Ohm
Lead Channel Impedance Value: 988 Ohm
Lead Channel Pacing Threshold Amplitude: 0.625 V
Lead Channel Pacing Threshold Amplitude: 0.625 V
Lead Channel Pacing Threshold Amplitude: 1 V
Lead Channel Pacing Threshold Pulse Width: 0.4 ms
Lead Channel Pacing Threshold Pulse Width: 0.4 ms
Lead Channel Pacing Threshold Pulse Width: 0.8 ms
Lead Channel Sensing Intrinsic Amplitude: 1.625 mV
Lead Channel Sensing Intrinsic Amplitude: 2.125 mV
Lead Channel Sensing Intrinsic Amplitude: 22.375 mV
Lead Channel Sensing Intrinsic Amplitude: 27.25 mV
Lead Channel Setting Pacing Amplitude: 2 V
Lead Channel Setting Pacing Amplitude: 2.5 V
Lead Channel Setting Pacing Amplitude: 2.5 V
Lead Channel Setting Pacing Pulse Width: 0.4 ms
Lead Channel Setting Pacing Pulse Width: 0.8 ms
Lead Channel Setting Sensing Sensitivity: 0.45 mV
Zone Setting Status: 755011

## 2023-01-08 NOTE — Patient Instructions (Addendum)
Medication Instructions:    Your physician recommends that you continue on your current medications as directed. Please refer to the Current Medication list given to you today.  *If you need a refill on your cardiac medications before your next appointment, please call your pharmacy*   Lab Work:  BMET AND CBC TODAY   If you have labs (blood work) drawn today and your tests are completely normal, you will receive your results only by: MyChart Message (if you have MyChart) OR A paper copy in the mail If you have any lab test that is abnormal or we need to change your treatment, we will call you to review the results.   Testing/Procedures:  SEE LETTER FOR DEVICE CHANGE BELOW      Follow-Up: At [facility], you and your health needs are our priority.  As part of our continuing mission to provide you with exceptional heart care, we have created designated Provider Care Teams.  These Care Teams include your primary Cardiologist (physician) and Advanced Practice Providers (APPs -  Physician Assistants and Nurse Practitioners) who all work together to provide you with the care you need, when you need it.  We recommend signing up for the patient portal called "MyChart".  Sign up information is provided on this After Visit Summary.  MyChart is used to connect with patients for Virtual Visits (Telemedicine).  Patients are able to view lab/test results, encounter notes, upcoming appointments, etc.  Non-urgent messages can be sent to your provider as well.   To learn more about what you can do with MyChart, go to [email or web address].    Your next appointment:  AFTER  01-10-23   10-14 DAYS  DEVICE WOUND CHECK  3 month(s)  Provider:   Sherryl Manges, MD    Other Instructions      Implantable Device Instructions    Debra Holloway  01/08/2023  You are scheduled for a Biventricular implantable cardioverter defibrillator on Thursday, April 10 with Dr. Sherryl Manges.  1. Pre procedure  Lab testing: You will come to the [facility] office on Monday, April 8 any time between 8:00 am and 4:30 pm.  You do NOT need to be fasting.   2. Please arrive at the Main Entrance A at [facility]: [address] [place], [place] [address] on April 10 at 6:30 AM (This time is two hours before your procedure to ensure your preparation). Free valet parking service is available. You will check in at ADMITTING. The support person will be asked to wait in the waiting room.  It is OK to have someone drop you off and come back when you are ready to be discharged.        Special note: Every effort is made to have your procedure done on time. Please understand that emergencies sometimes delay  scheduled procedures.  3.  No eating or drinking after midnight prior to procedure.     4.  Medication instructions:  On the morning of your procedure DO NOT take any medication.      !!IF ANY NEW MEDICATIONS ARE STARTED AFTER TODAY, PLEASE NOTIFY YOUR PROVIDER AS SOON AS POSSIBLE!!  FYI: Medications such as Semaglutide (Ozempic, [place]), Tirzepatide (Mounjaro, Zepbound), Dulaglutide (Trulicity), etc ("GLP1 agonists") must be held around the time of a procedure. Talk to your provider if you take one of these.  5.  The night before your procedure and the morning of your procedure scrub your neck/chest with CHG surgical scrub.  See instruction  letter.  6. Plan to go home the same day, you will only stay overnight if medically necessary. 7. You MUST have a responsible adult to drive you home. 8. An adult MUST be with you the first 24 hours after you arrive home. 9. Bring a current list of your medications, and the last time and date medication taken. 10. Bring ID and current insurance cards. 11. Please wear clothes that are easy to get on and off and wear slip-on shoes.    You will follow up with the [facility] Device clinic 10-14 days after your procedure.  You will follow up with Dr. Sherryl Manges 91 days  after your procedure.  These appointments will be made for you.   * If you have ANY questions after you get home, please call the office at [phone] or send a MyChart message.  FYI: For your safety, and to allow [place] to monitor your vital signs accurately during the surgery/procedure we request that if you have artificial nails, gel coating, SNS etc. Please have those removed prior to your surgery/procedure. Not having the nail coverings /polish removed may result in cancellation or delay of your surgery/procedure.    [facility] - Preparing For Surgery    Before surgery, you can play an important role. Because skin is not sterile, your skin needs to be as free of germs as possible. You can reduce the number of germs on your skin by washing with CHG (chlorahexidine gluconate) Soap before surgery.  CHG is an antiseptic cleaner which kills germs and bonds with the skin to continue killing germs even after washing.  Please do not use if you have an allergy to CHG or antibacterial soaps.  If your skin becomes reddened/irritated stop using the CHG.   Do not shave (including legs and underarms) for at least 48 hours prior to first CHG shower.  It is OK to shave your face.  Please follow these instructions carefully:  1.  Shower the night before surgery and the morning of surgery with CHG.  2.  If you choose to wash your hair, wash your hair first as usual with your normal shampoo.  3.  After you shampoo, rinse your hair and body thoroughly to remove the shampoo.  4.  Use CHG as you would any other liquid soap.  You can apply CHG directly to the skin and wash gently with a clean washcloth. 5.  Apply the CHG Soap to your body ONLY FROM THE NECK DOWN.  Do not use on open wounds or open sores.  Avoid contact with your eyes, ears, mouth and genitals (private parts).  Wash genitals (private parts) with your normal soap.  6.  Wash thoroughly, paying special attention to the area where your surgery will be  performed.  7.  Thoroughly rinse your body with warm water from the neck down.   8.  DO NOT shower/wash with your normal soap after using and rinsing off the CHG soap.  9.  Pat yourself dry with a clean towel.           10.  Wear clean pajamas.           11.  Place clean sheets on your bed the night of your first shower and do not sleep with pets.  Day of Surgery: Do not apply any deodorants/lotions.  Please wear clean clothes to the hospital/surgery center.

## 2023-01-09 ENCOUNTER — Telehealth: Payer: Self-pay | Admitting: Internal Medicine

## 2023-01-09 LAB — BASIC METABOLIC PANEL
BUN/Creatinine Ratio: 17 (ref 12–28)
BUN: 16 mg/dL (ref 8–27)
CO2: 21 mmol/L (ref 20–29)
Calcium: 9.8 mg/dL (ref 8.7–10.3)
Chloride: 103 mmol/L (ref 96–106)
Creatinine, Ser: 0.94 mg/dL (ref 0.57–1.00)
Glucose: 89 mg/dL (ref 70–99)
Potassium: 4.3 mmol/L (ref 3.5–5.2)
Sodium: 140 mmol/L (ref 134–144)
eGFR: 67 mL/min/{1.73_m2} (ref >59)

## 2023-01-09 LAB — CBC
Hematocrit: 42.6 % (ref 34.0–46.6)
Hemoglobin: 13.8 g/dL (ref 11.1–15.9)
MCH: 29.4 pg (ref 26.6–33.0)
MCHC: 32.4 g/dL (ref 31.5–35.7)
MCV: 91 fL (ref 79–97)
Platelets: 282 10*3/uL (ref 150–450)
RBC: 4.7 x10E6/uL (ref 3.77–5.28)
RDW: 13.1 % (ref 11.7–15.4)
WBC: 9.3 10*3/uL (ref 3.4–10.8)

## 2023-01-09 NOTE — Telephone Encounter (Signed)
Pt would like a callback regarding her appt. She was seen in the office yesterday 01/08/23 and was given discharge papers that stated her procedure is "Thursday April 10th" but the 10th is actually on tomorrow. Pt has another appt she has to go to on tomorrow and she's arranged transportation for the procedure to be done on Thursday. Its showing in her upcoming procedures as Wednesday 01/10/23. Pt is very concerned and would like clarity. She also stated her alarm was turned off on her device at her appt yesterday but since she's been home, its still been going off. Please advise.

## 2023-01-09 NOTE — Pre-Procedure Instructions (Signed)
Instructed patient on the following items: Arrival time 0630 Nothing to eat or drink after midnight No meds AM of procedure Responsible person to drive you home and stay with you for 24 hrs Wash with special soap night before and morning of procedure  

## 2023-01-09 NOTE — Telephone Encounter (Signed)
Pt will wait for procedure tomorrow.  Does not want to come in today to have alarm turned off.

## 2023-01-09 NOTE — Telephone Encounter (Signed)
Pt's procedure date and time has been clarified with pt in another encounter.  Please see that encounter for complete details.  Will forward to device clinic to make them aware pt's device is still alarming.

## 2023-01-09 NOTE — Telephone Encounter (Signed)
Sent mychart message

## 2023-01-10 ENCOUNTER — Encounter (HOSPITAL_COMMUNITY): Payer: Self-pay | Admitting: Internal Medicine

## 2023-01-10 ENCOUNTER — Encounter (HOSPITAL_COMMUNITY)
Admission: RE | Disposition: A | Discharge status: Home / Self Care | Payer: Self-pay | Source: Ambulatory Visit | Attending: Internal Medicine

## 2023-01-10 ENCOUNTER — Ambulatory Visit: Payer: Medicare Other | Admitting: Family Medicine

## 2023-01-10 ENCOUNTER — Other Ambulatory Visit: Payer: Self-pay

## 2023-01-10 ENCOUNTER — Ambulatory Visit (HOSPITAL_COMMUNITY)
Admission: RE | Admit: 2023-01-10 | Discharge: 2023-01-10 | Disposition: A | Discharge status: Home / Self Care | Payer: Medicare Other | Source: Ambulatory Visit | Attending: Internal Medicine | Admitting: Internal Medicine

## 2023-01-10 DIAGNOSIS — I428 Other cardiomyopathies: Secondary | ICD-10-CM | POA: Insufficient documentation

## 2023-01-10 DIAGNOSIS — R5383 Other fatigue: Secondary | ICD-10-CM | POA: Diagnosis not present

## 2023-01-10 DIAGNOSIS — I447 Left bundle-branch block, unspecified: Secondary | ICD-10-CM | POA: Diagnosis not present

## 2023-01-10 DIAGNOSIS — Z4502 Encounter for adjustment and management of automatic implantable cardiac defibrillator: Secondary | ICD-10-CM | POA: Insufficient documentation

## 2023-01-10 DIAGNOSIS — I5042 Chronic combined systolic (congestive) and diastolic (congestive) heart failure: Secondary | ICD-10-CM | POA: Diagnosis not present

## 2023-01-10 DIAGNOSIS — F419 Anxiety disorder, unspecified: Secondary | ICD-10-CM | POA: Diagnosis not present

## 2023-01-10 DIAGNOSIS — G4733 Obstructive sleep apnea (adult) (pediatric): Secondary | ICD-10-CM | POA: Diagnosis not present

## 2023-01-10 DIAGNOSIS — I429 Cardiomyopathy, unspecified: Secondary | ICD-10-CM

## 2023-01-10 HISTORY — PX: BIV ICD GENERATOR CHANGEOUT: EP1194

## 2023-01-10 SURGERY — BIV ICD GENERATOR CHANGEOUT

## 2023-01-10 MED ORDER — SODIUM CHLORIDE 0.9 % IV SOLN
80.0000 mg | INTRAVENOUS | Status: AC
  Administered 2023-01-10: 80 mg

## 2023-01-10 MED ORDER — LIDOCAINE HCL (PF) 1 % IJ SOLN
INTRAMUSCULAR | Status: DC | PRN
  Administered 2023-01-10: 60 mL

## 2023-01-10 MED ORDER — DIAZEPAM 5 MG PO TABS
5.0000 mg | ORAL_TABLET | Freq: Once | ORAL | Status: AC
  Administered 2023-01-10: 5 mg via ORAL
  Filled 2023-01-10: qty 1

## 2023-01-10 MED ORDER — VANCOMYCIN HCL IN DEXTROSE 1-5 GM/200ML-% IV SOLN: 1000.0000 mg | INTRAVENOUS | Status: AC

## 2023-01-10 MED ORDER — CHLORHEXIDINE GLUCONATE 4 % EX LIQD: 4.0000 | Freq: Once | CUTANEOUS | Status: DC

## 2023-01-10 MED ORDER — LIDOCAINE HCL 1 % IJ SOLN
INTRAMUSCULAR | Status: AC
  Filled 2023-01-10: qty 60

## 2023-01-10 MED ORDER — MIDAZOLAM HCL 5 MG/5ML IJ SOLN
INTRAMUSCULAR | Status: AC
  Filled 2023-01-10: qty 5

## 2023-01-10 MED ORDER — SODIUM CHLORIDE 0.9 % IV SOLN: INTRAVENOUS | Status: DC

## 2023-01-10 MED ORDER — FENTANYL CITRATE (PF) 100 MCG/2ML IJ SOLN
INTRAMUSCULAR | Status: DC | PRN
  Administered 2023-01-10 (×3): 25 ug via INTRAVENOUS

## 2023-01-10 MED ORDER — ACETAMINOPHEN 325 MG PO TABS: 325.0000 mg | ORAL_TABLET | ORAL | Status: DC | PRN

## 2023-01-10 MED ORDER — VANCOMYCIN HCL IN DEXTROSE 1-5 GM/200ML-% IV SOLN
INTRAVENOUS | Status: AC
  Administered 2023-01-10: 1000 mg via INTRAVENOUS
  Filled 2023-01-10: qty 400

## 2023-01-10 MED ORDER — SODIUM CHLORIDE 0.9 % IV SOLN
INTRAVENOUS | Status: AC
  Filled 2023-01-10: qty 2

## 2023-01-10 MED ORDER — MIDAZOLAM HCL 5 MG/5ML IJ SOLN
INTRAMUSCULAR | Status: DC | PRN
  Administered 2023-01-10: 1 mg via INTRAVENOUS
  Administered 2023-01-10: 2 mg via INTRAVENOUS
  Administered 2023-01-10: 1 mg via INTRAVENOUS

## 2023-01-10 MED ORDER — HEPARIN (PORCINE) IN NACL 1000-0.9 UT/500ML-% IV SOLN: INTRAVENOUS | Status: DC | PRN

## 2023-01-10 MED ORDER — FENTANYL CITRATE (PF) 100 MCG/2ML IJ SOLN
INTRAMUSCULAR | Status: AC
  Filled 2023-01-10: qty 2

## 2023-01-10 SURGICAL SUPPLY — 6 items
CABLE SURGICAL S-101-97-12 (CABLE) IMPLANT
ELECT DEFIB PAD ADLT CADENCE (PAD) IMPLANT
HEMOSTAT SURGICEL 2X4 FIBR (HEMOSTASIS) IMPLANT
ICD COBALT XT QUAD CRT DTPA2QQ (ICD Generator) IMPLANT
PAD DEFIB RADIO PHYSIO CONN (PAD) IMPLANT
TRAY PACEMAKER INSERTION (PACKS) IMPLANT

## 2023-01-10 NOTE — Interval H&P Note (Signed)
History and Physical Interval Note:  01/10/2023 8:00 AM  Debra Holloway  has presented today for surgery, with the diagnosis of eri.  The various methods of treatment have been discussed with the patient and family. After consideration of risks, benefits and other options for treatment, the patient has consented to  Procedure(s): BIV ICD GENERATOR CHANGEOUT (N/A) as a surgical intervention.  The patient's history has been reviewed, patient examined, no change in status, stable for surgery.  I have reviewed the patient's chart and labs.  Questions were answered to the patient's satisfaction.     [name]   

## 2023-01-10 NOTE — Discharge Instructions (Signed)

## 2023-01-10 NOTE — Interval H&P Note (Signed)
History and Physical Interval Note:  01/10/2023 8:00 AM  Debra Holloway  has presented today for surgery, with the diagnosis of eri.  The various methods of treatment have been discussed with the patient and family. After consideration of risks, benefits and other options for treatment, the patient has consented to  Procedure(s): BIV ICD GENERATOR CHANGEOUT (N/A) as a surgical intervention.  The patient's history has been reviewed, patient examined, no change in status, stable for surgery.  I have reviewed the patient's chart and labs.  Questions were answered to the patient's satisfaction.     Sherryl Manges

## 2023-01-11 ENCOUNTER — Encounter (HOSPITAL_COMMUNITY): Payer: Self-pay | Admitting: Internal Medicine

## 2023-01-12 ENCOUNTER — Ambulatory Visit: Payer: Medicare Other | Admitting: Family Medicine

## 2023-01-16 ENCOUNTER — Encounter: Payer: Self-pay | Admitting: Family Medicine

## 2023-01-22 ENCOUNTER — Ambulatory Visit (INDEPENDENT_AMBULATORY_CARE_PROVIDER_SITE_OTHER)
Admission: RE | Admit: 2023-01-22 | Discharge: 2023-01-22 | Disposition: A | Discharge status: Home / Self Care | Payer: Medicare Other | Source: Ambulatory Visit | Attending: Family Medicine | Admitting: Family Medicine

## 2023-01-22 ENCOUNTER — Ambulatory Visit (INDEPENDENT_AMBULATORY_CARE_PROVIDER_SITE_OTHER): Payer: Medicare Other | Admitting: Family Medicine

## 2023-01-22 ENCOUNTER — Other Ambulatory Visit: Payer: Self-pay | Admitting: Family Medicine

## 2023-01-22 VITALS — BP 126/80 | HR 94 | Temp 96.5°F | Ht 65.5 in | Wt 229.4 lb

## 2023-01-22 DIAGNOSIS — F32A Depression, unspecified: Secondary | ICD-10-CM | POA: Diagnosis not present

## 2023-01-22 DIAGNOSIS — F988 Other specified behavioral and emotional disorders with onset usually occurring in childhood and adolescence: Secondary | ICD-10-CM

## 2023-01-22 DIAGNOSIS — F419 Anxiety disorder, unspecified: Secondary | ICD-10-CM | POA: Diagnosis not present

## 2023-01-22 DIAGNOSIS — M25552 Pain in left hip: Secondary | ICD-10-CM

## 2023-01-22 DIAGNOSIS — F909 Attention-deficit hyperactivity disorder, unspecified type: Secondary | ICD-10-CM

## 2023-01-22 MED ORDER — LISDEXAMFETAMINE DIMESYLATE 50 MG PO CAPS: 50.0000 mg | ORAL_CAPSULE | Freq: Every day | ORAL | 0 refills | Status: DC

## 2023-01-22 MED ORDER — BUPROPION HCL ER (XL) 300 MG PO TB24
300.0000 mg | ORAL_TABLET | Freq: Every day | ORAL | 3 refills | Status: AC
Start: 2023-01-22 — End: ?

## 2023-01-22 NOTE — Patient Instructions (Addendum)
It was very nice to see you today!  We will refill your medications today.  Will get an x-ray of your hip.  Please go to the lab in the office to have this done.  Return in about 6 months (around 07/24/2023) for Annual Physical.   Take care, Dr Jimmey Ralph  PLEASE NOTE:  If you had any lab tests, please let us know if you have not heard back within a few days. You may see your results on mychart before we have a chance to review them but we will give you a call once they are reviewed by [place].   If we ordered any referrals today, please let us know if you have not heard from their office within the next week.   If you had any urgent prescriptions sent in today, please check with the pharmacy within an hour of our visit to make sure the prescription was transmitted appropriately.   Please try these tips to maintain a healthy lifestyle:  Eat at least 3 REAL meals and 1-2 snacks per day.  Aim for no more than 5 hours between eating.  If you eat breakfast, please do so within one hour of gWetting up.   Each meal should contain half fruits/vegetables, one quarter protein, and one quarter carbs (no bigger than a computer mouse)  Cut down on sweet beverages. This includes juice, soda, and sweet tea.   Drink at least 1 glass of water with each meal and aim for at least 8 glasses per day  Exercise at least 150 minutes every week.

## 2023-01-22 NOTE — Assessment & Plan Note (Signed)
Stable on Vyvanse 50 mg daily.  Medications help with ability stay focused on task.  She is tolerating well without any significant side effects.  Will refill today.  Follow-up in 6 months.

## 2023-01-22 NOTE — Assessment & Plan Note (Signed)
Doing well on Wellbutrin 300 mg daily.  She did run out of medication recently and has noticed worsening symptoms since being out and she would like to restart.  No reported SI or HI today.  Will restart her Wellbutrin.  She can follow-up in a few weeks if symptoms do not return to baseline.

## 2023-01-22 NOTE — Progress Notes (Signed)
   Debra Holloway is a 67 y.o. female who presents today for an office visit.  Assessment/Plan:  New/Acute Problems: Left hip pain No red flags.  Concern for possible underlying OA.  Will check plain films today to further evaluate.  She can use over-the-counter meds as needed.  May consider referral to PT or sports medicine depending on results of x-ray.  Chronic Problems Addressed Today: ADHD Stable on Vyvanse 50 mg daily.  Medications help with ability stay focused on task.  She is tolerating well without any significant side effects.  Will refill today.  Follow-up in 6 months.  Anxiety and depression Doing well on Wellbutrin 300 mg daily.  She did run out of medication recently and has noticed worsening symptoms since being out and she would like to restart.  No reported SI or HI today.  Will restart her Wellbutrin.  She can follow-up in a few weeks if symptoms do not return to baseline.     Subjective:  HPI:  See A/P for status of chronic conditions.  Patient seen here 5 months ago for annual physical.  Doing well at that time.  We continued her on her chronic medications.  She did have ICD replace a week and half ago and is still a little sore from this but is otherwise doing well.   She ran out of her Wellbutrin a few weeks ago.  She has had a little bit worsening anxiety and depression since then.  No SI or HI.  She was not sure if Wellbutrin was helping her previously but now wishes to go back.  She has also been having some left hip pain. This started a few months ago. No falls or obvious precipitating events. Tried OTC meds which has helped. Pain is minimal when sitting but gets worse with movement.        Objective:  Physical Exam: BP 126/80   Pulse 94   Temp (!) 96.5 F (35.8 C) (Temporal)   Ht 5' 5.5" (1.664 m)   Wt 229 lb 6.4 oz (104.1 kg)   SpO2 96%   BMI 37.59 kg/m   Gen: No acute distress, resting comfortably CV: Regular rate and rhythm with no murmurs  appreciated Pulm: Normal work of breathing, clear to auscultation bilaterally with no crackles, wheezes, or rhonchi MSK: - Back: No deformities - Left hip: No deformities.  No pain with palpation along greater trochanter.  Limited external rotation.  This exacerbates her pain.  Normal internal rotation Neuro: Grossly normal, moves all extremities Psych: Normal affect and thought content      Joe Gee M. Jimmey Ralph, MD 01/22/2023 11:37 AM

## 2023-01-25 NOTE — Progress Notes (Signed)
X-ray does not show any obvious fracture or dislocation.  If she is still having pain recommend we have her follow-up with sports medicine.  Please place referral if needed.  Katina Degree. Jimmey Ralph, MD 01/25/2023 1:31 PM

## 2023-01-31 ENCOUNTER — Ambulatory Visit: Payer: Medicare Other | Attending: Cardiovascular Disease

## 2023-01-31 DIAGNOSIS — I428 Other cardiomyopathies: Secondary | ICD-10-CM

## 2023-01-31 LAB — CUP PACEART INCLINIC DEVICE CHECK
Date Time Interrogation Session: 20240501170650
Implantable Lead Connection Status: 753985
Implantable Lead Connection Status: 753985
Implantable Lead Connection Status: 753985
Implantable Lead Implant Date: 20161114
Implantable Lead Implant Date: 20161114
Implantable Lead Implant Date: 20161114
Implantable Lead Location: 753858
Implantable Lead Location: 753859
Implantable Lead Location: 753860
Implantable Lead Model: 4598
Implantable Lead Model: 5076
Implantable Pulse Generator Implant Date: 20240410

## 2023-01-31 NOTE — Progress Notes (Signed)
Wound check appointment. Steri-strips removed. Wound without redness or edema. Incision edges approximated, wound well healed. Normal CRT-D device function. BIVP Effectively 96.9%. Thresholds, sensing, and impedances consistent with implant measurements. Device programmed appropriately for chronic lead safety margins. Histogram distribution appropriate for patient and level of activity. 2 episodes flagged as Aflutter events; however, noted oversensing of [place] also contirbuting to rates. See programming changes below to address. There were 2 true events, longest 47 secs of AT with rates in the 150's noted. Ventricular rates controlled.  No ventricular arrhythmias noted. Patient educated about wound care and shock plan. Leads are chronic, no further instructions needed for arm mobility.  ROV in 3 months with implanting physician.  Programming changes made: 1.  PVAB - turned on Partial plus

## 2023-01-31 NOTE — Progress Notes (Signed)
Remote ICD transmission.   

## 2023-01-31 NOTE — Patient Instructions (Signed)
   After Your ICD (Implantable Cardiac Defibrillator)    Monitor your defibrillator site for redness, swelling, and drainage. Call the device clinic at [phone] if you experience these symptoms or fever/chills.  Your incision was closed with Dermabond:  You may shower 1 day after your defibrillator implant and wash your incision with soap and water. Avoid lotions, ointments, or perfumes over your incision until it is well-healed.  You may use a hot tub or a pool after your wound check appointment if the incision is completely closed.  There are no other restrictions in arm movement after your wound check appointment.   Your ICD is designed to protect you from life threatening heart rhythms. Because of this, you may receive a shock.   1 shock with no symptoms:  Call the office during business hours. 1 shock with symptoms (chest pain, chest pressure, dizziness, lightheadedness, shortness of breath, overall feeling unwell):  Call 911. If you experience 2 or more shocks in 24 hours:  Call 911. If you receive a shock, you should not drive.  [place] DMV - no driving for 6 months if you receive appropriate therapy from your ICD.   ICD Alerts:  Some alerts are vibratory and others beep. These are NOT emergencies. Please call our office to let us know. If this occurs at night or on weekends, it can wait until the next business day. Send a remote transmission.  If your device is capable of reading fluid status (for heart failure), you will be offered monthly monitoring to review this with you.   Remote monitoring is used to monitor your ICD from home. This monitoring is scheduled every 91 days by our office. It allows us to keep an eye on the functioning of your device to ensure it is working properly. You will routinely see your Electrophysiologist annually (more often if necessary).  

## 2023-02-27 ENCOUNTER — Encounter: Payer: Self-pay | Admitting: Family Medicine

## 2023-02-27 DIAGNOSIS — F988 Other specified behavioral and emotional disorders with onset usually occurring in childhood and adolescence: Secondary | ICD-10-CM

## 2023-02-27 MED ORDER — LISDEXAMFETAMINE DIMESYLATE 50 MG PO CAPS
50.00 mg | ORAL_CAPSULE | Freq: Every day | ORAL | 0 refills | Status: DC
Start: 2023-02-27 — End: 2023-04-09

## 2023-04-06 ENCOUNTER — Encounter: Payer: Self-pay | Admitting: Family Medicine

## 2023-04-06 DIAGNOSIS — F988 Other specified behavioral and emotional disorders with onset usually occurring in childhood and adolescence: Secondary | ICD-10-CM

## 2023-04-09 MED ORDER — LISDEXAMFETAMINE DIMESYLATE 50 MG PO CAPS: 50.0000 mg | ORAL_CAPSULE | Freq: Every day | ORAL | 0 refills | Status: DC

## 2023-04-17 ENCOUNTER — Ambulatory Visit (INDEPENDENT_AMBULATORY_CARE_PROVIDER_SITE_OTHER): Payer: Medicare Other

## 2023-04-17 DIAGNOSIS — I428 Other cardiomyopathies: Secondary | ICD-10-CM

## 2023-04-19 LAB — CUP PACEART REMOTE DEVICE CHECK
Battery Remaining Longevity: 97 mo
Battery Voltage: 3.04 V
Brady Statistic AP VP Percent: 0.15 %
Brady Statistic AP VS Percent: 0.03 %
Brady Statistic AS VP Percent: 97.73 %
Brady Statistic AS VS Percent: 2.09 %
Brady Statistic RA Percent Paced: 0.18 %
Brady Statistic RV Percent Paced: 97.87 %
Date Time Interrogation Session: 20240716222844
HighPow Impedance: 84 Ohm
Implantable Lead Connection Status: 753985
Implantable Lead Connection Status: 753985
Implantable Lead Connection Status: 753985
Implantable Lead Implant Date: 20161114
Implantable Lead Implant Date: 20161114
Implantable Lead Implant Date: 20161114
Implantable Lead Location: 753858
Implantable Lead Location: 753859
Implantable Lead Location: 753860
Implantable Lead Model: 4598
Implantable Lead Model: 5076
Implantable Pulse Generator Implant Date: 20240410
Lead Channel Impedance Value: 1045 Ohm
Lead Channel Impedance Value: 1064 Ohm
Lead Channel Impedance Value: 1064 Ohm
Lead Channel Impedance Value: 1083 Ohm
Lead Channel Impedance Value: 1197 Ohm
Lead Channel Impedance Value: 380 Ohm
Lead Channel Impedance Value: 418 Ohm
Lead Channel Impedance Value: 475 Ohm
Lead Channel Impedance Value: 513 Ohm
Lead Channel Impedance Value: 513 Ohm
Lead Channel Impedance Value: 608 Ohm
Lead Channel Impedance Value: 646 Ohm
Lead Channel Impedance Value: 665 Ohm
Lead Channel Pacing Threshold Amplitude: 0.625 V
Lead Channel Pacing Threshold Amplitude: 0.75 V
Lead Channel Pacing Threshold Amplitude: 1.375 V
Lead Channel Pacing Threshold Pulse Width: 0.4 ms
Lead Channel Pacing Threshold Pulse Width: 0.4 ms
Lead Channel Pacing Threshold Pulse Width: 0.4 ms
Lead Channel Sensing Intrinsic Amplitude: 2.5 mV
Lead Channel Sensing Intrinsic Amplitude: 22.9 mV
Lead Channel Setting Pacing Amplitude: 1.5 V
Lead Channel Setting Pacing Amplitude: 2 V
Lead Channel Setting Pacing Amplitude: 3 V
Lead Channel Setting Pacing Pulse Width: 0.4 ms
Lead Channel Setting Pacing Pulse Width: 0.4 ms
Lead Channel Setting Sensing Sensitivity: 0.3 mV
Zone Setting Status: 755011
Zone Setting Status: 755011

## 2023-05-03 ENCOUNTER — Encounter: Payer: Self-pay | Admitting: Internal Medicine

## 2023-05-03 ENCOUNTER — Ambulatory Visit: Payer: Medicare Other | Attending: Internal Medicine | Admitting: Internal Medicine

## 2023-05-03 VITALS — BP 130/94 | HR 76 | Ht 65.5 in | Wt 232.6 lb

## 2023-05-03 DIAGNOSIS — I480 Paroxysmal atrial fibrillation: Secondary | ICD-10-CM | POA: Diagnosis not present

## 2023-05-03 DIAGNOSIS — I5022 Chronic systolic (congestive) heart failure: Secondary | ICD-10-CM

## 2023-05-03 DIAGNOSIS — Z95810 Presence of automatic (implantable) cardiac defibrillator: Secondary | ICD-10-CM

## 2023-05-03 DIAGNOSIS — I420 Dilated cardiomyopathy: Secondary | ICD-10-CM | POA: Diagnosis not present

## 2023-05-03 NOTE — Patient Instructions (Addendum)

## 2023-05-03 NOTE — Progress Notes (Signed)
Remote ICD transmission.   

## 2023-05-03 NOTE — Progress Notes (Signed)
Patient Care Team: Ardith Dark, MD as PCP - General (Family Medicine) Lars Masson, MD as PCP - Cardiology (Cardiology) Duke Salvia, MD as PCP - Electrophysiology (Cardiology) Dahlia Byes, [facility] (Inactive) as Pharmacist (Pharmacist)   HPI  Debra Holloway is a 67 y.o. female Seen in follow-up for CRT-D-Medtronic implanted 11/16 for left bundle branch block with nonischemic cardiomyopathy with interval normalization. Gen change 4/24   The patient denies changes in   shortness of breath, nocturnal dyspnea, orthopnea or peripheral edema.  No chest pain there have been no palpitations, lightheadedness or syncope.  Complains of soreness related to above near fall from a ladder..    Fatigue  depression better but she thinks this is the summertime Attempted to change beta blocker on the carvedilol and feeling a little bit less fatigue  DATE TEST EF   4/16 cMRI  25-30 %   4/16 LHC  Normal coronaries  4/17 Echo   40-45 %   8/18 Echo  50-55%   3/20 Echo  55-60%   7/21 Echo  50-55%   10/23 Echo  45-50%   10/23 CTA  CaScore 0   Date Cr K Hgb  10/18 0.92 4.2    4/21 1.02 4.2   10/22 0.88 4.4 13.5  10/23 0.89 4.3 13.2  4/24 0.94 4.3 13.8      Past Medical History:  Diagnosis Date   ADD (attention deficit disorder)    AICD (automatic cardioverter/defibrillator) present 08-28-15   Anxiety    "just when my daddy died a few years ago" (08-28-15)   AR (allergic rhinitis)    Chicken pox    Chronic combined systolic and diastolic CHF (congestive heart failure) (HCC)    Depression    "just when my daddy died a few years ago" (August 28, 2015)   Dyslipidemia    Environmental allergies    H/O vitamin D deficiency    LBBB (left bundle branch block)    W/1st degree AV Block, Avoid Beta Blockers   NICM (nonischemic cardiomyopathy) (HCC)    OSA (obstructive sleep apnea) 03/18/2015   Severe OSA with AHI 30/hr; "suppose to wear mask but don't" (2015-08-28)   Seasonal  affective disorder (HCC)     Past Surgical History:  Procedure Laterality Date   BIV ICD GENERATOR CHANGEOUT N/A 01/10/2023   Procedure: BIV ICD GENERATOR CHANGEOUT;  Surgeon: Duke Salvia, MD;  Location: [facility] INVASIVE CV LAB;  Service: Cardiovascular;  Laterality: N/A;   CARDIAC CATHETERIZATION     DILATION AND CURETTAGE OF UTERUS  2003   EP IMPLANTABLE DEVICE N/A 2015/08/28   Procedure: BiV ICD Insertion CRT-D;  Surgeon: Duke Salvia, MD;  Location: [facility] INVASIVE CV LAB;  Service: Cardiovascular;  Laterality: N/A;   LEFT AND RIGHT HEART CATHETERIZATION WITH CORONARY ANGIOGRAM N/A 11/17/2014   Procedure: LEFT AND RIGHT HEART CATHETERIZATION WITH CORONARY ANGIOGRAM;  Surgeon: Lennette Bihari, MD;  Location: [facility] CATH LAB;  Service: Cardiovascular;  Laterality: N/A;   TOTAL ABDOMINAL HYSTERECTOMY  2004   TUBAL LIGATION     WISDOM TOOTH EXTRACTION  1986    Current Outpatient Medications  Medication Sig Dispense Refill   b complex vitamins tablet Take 1 tablet by mouth daily.     buPROPion (WELLBUTRIN XL) 300 MG 24 hr tablet Take 1 tablet (300 mg total) by mouth daily. 90 tablet 3   carvedilol (COREG) 6.25 MG tablet Take 1 tablet (6.25 mg total) by mouth 2 (two) times daily.  180 tablet 3   Cholecalciferol (VITAMIN D PO) Take 2,000 mg by mouth daily.      fexofenadine (ALLEGRA) 180 MG tablet Take 180 mg by mouth daily.     fluticasone (FLONASE) 50 MCG/ACT nasal spray Place 1 spray into both nostrils daily.     furosemide (LASIX) 40 MG tablet Take 1 tablet (40 mg total) by mouth daily. 90 tablet 3   lisdexamfetamine (VYVANSE) 50 MG capsule Take 1 capsule (50 mg total) by mouth daily. 30 capsule 0   potassium chloride (KLOR-CON) 10 MEQ tablet Take 1 tablet (10 mEq total) by mouth daily. 90 tablet 3   spironolactone (ALDACTONE) 25 MG tablet Take 1 tablet (25 mg total) by mouth daily. 45 tablet 3   No current facility-administered medications for this visit.    Allergies  Allergen Reactions    Bee Venom Anaphylaxis   Codeine Nausea And Vomiting   Losartan Other (See Comments)    Pt reports cough and insomnia.   Erythromycin Itching and Rash   Lovastatin Other (See Comments)    weakness   Nickel Rash   Penicillins Hives, Itching and Rash   Simvastatin Other (See Comments)    weakness      Review of Systems negative except from HPI and PMH  Physical Exam BP (!) 130/94 (BP Location: Right Arm, Patient Position: Sitting, Cuff Size: Large)   Pulse 76   Ht 5' 5.5" (1.664 m)   Wt 232 lb 9.6 oz (105.5 kg)   SpO2 95%   BMI 38.12 kg/m  Well developed and well nourished in no acute distress HENT normal Neck supple with JVP-flat Clear Device pocket well healed; without hematoma or erythema.  There is no tethering  Regular rate and rhythm, no  gallop No  murmur Abd-soft with active BS No Clubbing cyanosis  edema Skin-warm and dry A & Oriented  Grossly normal sensory and motor function  ECG sinus with P synchronous pacing at 89 09/14/43 Negative QRS lead I RS lead V1  Device function is  normal. Programming changes decreased LV output See Paceart for details    Assessment and  Plan  Nonischemic cardiomyopathy  Left bundle branch block  SCAF   CRT-ICD Medtronic     Congestive heart failure-chronic diastolic  Dyspnea on exertion  Anxiety /depression  Obesity  Fatigue   With her cardiomyopathy, continue her on carvedilol and spironolactone.  Blood pressures are on the higher side which she says is related to her pain.  Remains euvolemic.  Continue her on furosemide as needed and spironolactone.  With her HFpEF mildly depressed left ventricular function ; plan to start an SGLT2  SCAF; no indication for anticoagulation

## 2023-05-14 ENCOUNTER — Encounter: Payer: Self-pay | Admitting: Family Medicine

## 2023-05-14 DIAGNOSIS — F988 Other specified behavioral and emotional disorders with onset usually occurring in childhood and adolescence: Secondary | ICD-10-CM

## 2023-05-14 MED ORDER — LISDEXAMFETAMINE DIMESYLATE 50 MG PO CAPS: 50.0000 mg | ORAL_CAPSULE | Freq: Every day | ORAL | 0 refills | Status: DC

## 2023-05-23 ENCOUNTER — Encounter: Payer: Self-pay | Admitting: Family Medicine

## 2023-05-24 ENCOUNTER — Encounter: Payer: Self-pay | Admitting: Internal Medicine

## 2023-05-24 ENCOUNTER — Other Ambulatory Visit: Payer: Self-pay

## 2023-05-24 ENCOUNTER — Other Ambulatory Visit: Payer: Self-pay | Admitting: Internal Medicine

## 2023-05-24 MED ORDER — FUROSEMIDE 40 MG PO TABS: 40.0000 mg | ORAL_TABLET | Freq: Every day | ORAL | 3 refills | Status: DC

## 2023-05-24 MED ORDER — CARVEDILOL 6.25 MG PO TABS: 6.2500 mg | ORAL_TABLET | Freq: Two times a day (BID) | ORAL | 3 refills | Status: DC

## 2023-06-19 ENCOUNTER — Encounter: Payer: Self-pay | Admitting: Family Medicine

## 2023-06-19 DIAGNOSIS — F988 Other specified behavioral and emotional disorders with onset usually occurring in childhood and adolescence: Secondary | ICD-10-CM

## 2023-06-20 MED ORDER — LISDEXAMFETAMINE DIMESYLATE 50 MG PO CAPS: 50.0000 mg | ORAL_CAPSULE | Freq: Every day | ORAL | 0 refills | Status: DC

## 2023-07-17 ENCOUNTER — Ambulatory Visit (INDEPENDENT_AMBULATORY_CARE_PROVIDER_SITE_OTHER): Payer: Medicare Other

## 2023-07-17 DIAGNOSIS — I5022 Chronic systolic (congestive) heart failure: Secondary | ICD-10-CM | POA: Diagnosis not present

## 2023-07-17 DIAGNOSIS — I428 Other cardiomyopathies: Secondary | ICD-10-CM

## 2023-07-18 LAB — CUP PACEART REMOTE DEVICE CHECK
Battery Remaining Longevity: 105 mo
Battery Voltage: 3.01 V
Brady Statistic AP VP Percent: 0.21 %
Brady Statistic AP VS Percent: 0.02 %
Brady Statistic AS VP Percent: 96.74 %
Brady Statistic AS VS Percent: 3.03 %
Brady Statistic RA Percent Paced: 0.23 %
Brady Statistic RV Percent Paced: 96.93 %
Date Time Interrogation Session: 20241015101123
HighPow Impedance: 77 Ohm
Implantable Lead Connection Status: 753985
Implantable Lead Connection Status: 753985
Implantable Lead Connection Status: 753985
Implantable Lead Implant Date: 20161114
Implantable Lead Implant Date: 20161114
Implantable Lead Implant Date: 20161114
Implantable Lead Location: 753858
Implantable Lead Location: 753859
Implantable Lead Location: 753860
Implantable Lead Model: 4598
Implantable Lead Model: 5076
Implantable Pulse Generator Implant Date: 20240410
Lead Channel Impedance Value: 1083 Ohm
Lead Channel Impedance Value: 1102 Ohm
Lead Channel Impedance Value: 1140 Ohm
Lead Channel Impedance Value: 361 Ohm
Lead Channel Impedance Value: 399 Ohm
Lead Channel Impedance Value: 399 Ohm
Lead Channel Impedance Value: 494 Ohm
Lead Channel Impedance Value: 532 Ohm
Lead Channel Impedance Value: 570 Ohm
Lead Channel Impedance Value: 684 Ohm
Lead Channel Impedance Value: 684 Ohm
Lead Channel Impedance Value: 988 Ohm
Lead Channel Impedance Value: 988 Ohm
Lead Channel Pacing Threshold Amplitude: 0.625 V
Lead Channel Pacing Threshold Amplitude: 0.75 V
Lead Channel Pacing Threshold Amplitude: 1 V
Lead Channel Pacing Threshold Pulse Width: 0.4 ms
Lead Channel Pacing Threshold Pulse Width: 0.4 ms
Lead Channel Pacing Threshold Pulse Width: 0.7 ms
Lead Channel Sensing Intrinsic Amplitude: 1.6 mV
Lead Channel Sensing Intrinsic Amplitude: 27 mV
Lead Channel Setting Pacing Amplitude: 1.5 V
Lead Channel Setting Pacing Amplitude: 1.5 V
Lead Channel Setting Pacing Amplitude: 2 V
Lead Channel Setting Pacing Pulse Width: 0.4 ms
Lead Channel Setting Pacing Pulse Width: 0.7 ms
Lead Channel Setting Sensing Sensitivity: 0.3 mV
Zone Setting Status: 755011
Zone Setting Status: 755011

## 2023-07-22 ENCOUNTER — Encounter: Payer: Self-pay | Admitting: Family Medicine

## 2023-07-22 DIAGNOSIS — F988 Other specified behavioral and emotional disorders with onset usually occurring in childhood and adolescence: Secondary | ICD-10-CM

## 2023-07-24 MED ORDER — LISDEXAMFETAMINE DIMESYLATE 50 MG PO CAPS
50.0000 mg | ORAL_CAPSULE | Freq: Every day | ORAL | 0 refills | Status: DC
Start: 2023-07-24 — End: 2023-08-28

## 2023-08-06 NOTE — Progress Notes (Signed)
Remote ICD transmission.   

## 2023-08-27 ENCOUNTER — Telehealth: Payer: Self-pay | Admitting: Family Medicine

## 2023-08-27 ENCOUNTER — Encounter: Payer: Self-pay | Admitting: Family Medicine

## 2023-08-27 DIAGNOSIS — F988 Other specified behavioral and emotional disorders with onset usually occurring in childhood and adolescence: Secondary | ICD-10-CM

## 2023-08-27 NOTE — Telephone Encounter (Signed)
Error

## 2023-08-27 NOTE — Telephone Encounter (Signed)
Called pt to get her scheduled. Informed pt that per last AVS from April OV, she was meant to f/u in October for her CPE. Pt stated she didn't get her usual reminder from [place] so she didn't schedule. Patient states she runs out of medication in 2 days. I offered pt to schedule her CPE for February (next opening) and then schedule her an OV for 11/27 to satisfy her medication refill. Pt declined due to being out of town on 11/27. I tried to offer patient to at least schedule her CPE while I also send a message back to provider but she declined. Pt stated she would request med refill via Mychart as normal.

## 2023-08-27 NOTE — Telephone Encounter (Signed)
Patient is requesting Vyvanse and an appointment w/Dr Jimmey Ralph.  Can someone give her a call back to get her on Dr Lavone Neri schedule and handle the Vyvanse request.  Thank You  Gabriel Cirri [facility] AWV TEAM Direct Dial [phone]

## 2023-08-28 MED ORDER — LISDEXAMFETAMINE DIMESYLATE 50 MG PO CAPS: 50.0000 mg | ORAL_CAPSULE | Freq: Every day | ORAL | 0 refills | Status: DC

## 2023-08-28 NOTE — Telephone Encounter (Signed)
Prescription filled but she needs to keep her appointment.  Debra Holloway. Jimmey Ralph, MD 08/28/2023 8:47 AM

## 2023-08-28 NOTE — Telephone Encounter (Signed)
See note

## 2023-09-03 ENCOUNTER — Ambulatory Visit (INDEPENDENT_AMBULATORY_CARE_PROVIDER_SITE_OTHER): Payer: Medicare Other | Admitting: Family Medicine

## 2023-09-03 VITALS — BP 135/81 | HR 76 | Temp 97.5°F | Ht 65.5 in | Wt 235.0 lb

## 2023-09-03 DIAGNOSIS — I480 Paroxysmal atrial fibrillation: Secondary | ICD-10-CM | POA: Diagnosis not present

## 2023-09-03 DIAGNOSIS — F909 Attention-deficit hyperactivity disorder, unspecified type: Secondary | ICD-10-CM

## 2023-09-03 DIAGNOSIS — R7989 Other specified abnormal findings of blood chemistry: Secondary | ICD-10-CM

## 2023-09-03 DIAGNOSIS — E785 Hyperlipidemia, unspecified: Secondary | ICD-10-CM

## 2023-09-03 DIAGNOSIS — F32A Depression, unspecified: Secondary | ICD-10-CM

## 2023-09-03 DIAGNOSIS — R739 Hyperglycemia, unspecified: Secondary | ICD-10-CM | POA: Diagnosis not present

## 2023-09-03 DIAGNOSIS — F419 Anxiety disorder, unspecified: Secondary | ICD-10-CM | POA: Diagnosis not present

## 2023-09-03 DIAGNOSIS — F988 Other specified behavioral and emotional disorders with onset usually occurring in childhood and adolescence: Secondary | ICD-10-CM | POA: Diagnosis not present

## 2023-09-03 DIAGNOSIS — Z0001 Encounter for general adult medical examination with abnormal findings: Secondary | ICD-10-CM | POA: Diagnosis not present

## 2023-09-03 DIAGNOSIS — Z0000 Encounter for general adult medical examination without abnormal findings: Secondary | ICD-10-CM

## 2023-09-03 DIAGNOSIS — Z6838 Body mass index (BMI) 38.0-38.9, adult: Secondary | ICD-10-CM | POA: Diagnosis not present

## 2023-09-03 LAB — CBC
HCT: 39.4 % (ref 36.0–46.0)
Hemoglobin: 13.3 g/dL (ref 12.0–15.0)
MCHC: 33.7 g/dL (ref 30.0–36.0)
MCV: 90.9 fL (ref 78.0–100.0)
Platelets: 227 10*3/uL (ref 150.0–400.0)
RBC: 4.34 Mil/uL (ref 3.87–5.11)
RDW: 13.3 % (ref 11.5–15.5)
WBC: 8.2 10*3/uL (ref 4.0–10.5)

## 2023-09-03 LAB — COMPREHENSIVE METABOLIC PANEL
ALT: 16 U/L (ref 0–35)
AST: 17 U/L (ref 0–37)
Albumin: 4 g/dL (ref 3.5–5.2)
Alkaline Phosphatase: 67 U/L (ref 39–117)
BUN: 15 mg/dL (ref 6–23)
CO2: 25 meq/L (ref 19–32)
Calcium: 9.3 mg/dL (ref 8.4–10.5)
Chloride: 105 meq/L (ref 96–112)
Creatinine, Ser: 0.98 mg/dL (ref 0.40–1.20)
GFR: 60.4 mL/min (ref >60.00)
Glucose, Bld: 97 mg/dL (ref 70–99)
Potassium: 3.8 meq/L (ref 3.5–5.1)
Sodium: 135 meq/L (ref 135–145)
Total Bilirubin: 0.4 mg/dL (ref 0.2–1.2)
Total Protein: 7.3 g/dL (ref 6.0–8.3)

## 2023-09-03 LAB — LIPID PANEL
Cholesterol: 213 mg/dL — ABNORMAL HIGH (ref 0–200)
HDL: 52.1 mg/dL (ref >39.00)
LDL Cholesterol: 123 mg/dL — ABNORMAL HIGH (ref 0–99)
NonHDL: 160.54
Total CHOL/HDL Ratio: 4
Triglycerides: 188 mg/dL — ABNORMAL HIGH (ref 0.0–149.0)
VLDL: 37.6 mg/dL (ref 0.0–40.0)

## 2023-09-03 LAB — TSH: TSH: 3.83 u[IU]/mL (ref 0.35–5.50)

## 2023-09-03 LAB — VITAMIN D 25 HYDROXY (VIT D DEFICIENCY, FRACTURES): VITD: 17.43 ng/mL — ABNORMAL LOW (ref 30.00–100.00)

## 2023-09-03 LAB — HEMOGLOBIN A1C: Hgb A1c MFr Bld: 5.8 % (ref 4.6–6.5)

## 2023-09-03 MED ORDER — LISDEXAMFETAMINE DIMESYLATE 50 MG PO CAPS
50.0000 mg | ORAL_CAPSULE | Freq: Every day | ORAL | 0 refills | Status: DC
Start: 2023-09-03 — End: 2023-10-04

## 2023-09-03 MED ORDER — WEGOVY 0.25 MG/0.5ML ~~LOC~~ SOAJ: 0.2500 mg | SUBCUTANEOUS | 0 refills | Status: DC

## 2023-09-03 NOTE — Patient Instructions (Signed)
It was very nice to see you today!  We we will check blood work today.  Please go to the pharmacy for your vaccines.  Let me know if you change your mind about colon cancer screening or breast cancer screening.  We will start low-dose Wegovy.  Please send me a message in a few weeks to let me how you are doing with this.  Please continue to work on diet and exercise.  Return in about 6 months (around 03/03/2024) for Follow Up.   Take care, Dr Jimmey Ralph  PLEASE NOTE:  If you had any lab tests, please let us know if you have not heard back within a few days. You may see your results on mychart before we have a chance to review them but we will give you a call once they are reviewed by [place].   If we ordered any referrals today, please let us know if you have not heard from their office within the next week.   If you had any urgent prescriptions sent in today, please check with the pharmacy within an hour of our visit to make sure the prescription was transmitted appropriately.   Please try these tips to maintain a healthy lifestyle:  Eat at least 3 REAL meals and 1-2 snacks per day.  Aim for no more than 5 hours between eating.  If you eat breakfast, please do so within one hour of getting up.   Each meal should contain half fruits/vegetables, one quarter protein, and one quarter carbs (no bigger than a computer mouse)  Cut down on sweet beverages. This includes juice, soda, and sweet tea.   Drink at least 1 glass of water with each meal and aim for at least 8 glasses per day  Exercise at least 150 minutes every week.

## 2023-09-03 NOTE — Assessment & Plan Note (Signed)
Check vitamin D. 

## 2023-09-03 NOTE — Assessment & Plan Note (Signed)
Regular rate and rhythm today.  Continue management per cardiology. 

## 2023-09-03 NOTE — Assessment & Plan Note (Signed)
Discussed lifestyle modifications.  Check beats.

## 2023-09-03 NOTE — Assessment & Plan Note (Signed)
Overall stable on Vyvanse 50 mg daily.  Medications help with her ability to stay focused and on task.  She is not having any significant side effects.  Will refill today.  Follow-up in 6 months.

## 2023-09-03 NOTE — Progress Notes (Signed)
Chief Complaint:  Debra Holloway is a 67 y.o. female who presents today for her annual comprehensive physical exam.    Assessment/Plan:  Chronic Problems Addressed Today: ADHD Overall stable on Vyvanse 50 mg daily.  Medications help with her ability to stay focused and on task.  She is not having any significant side effects.  Will refill today.  Follow-up in 6 months.  Dyslipidemia Discussed lifestyle modifications.  Check beats.  Anxiety and depression Stable on Wellbutrin 300 mg daily.  Does not need refill today.   PAF (paroxysmal atrial fibrillation) (HCC) SCAF Regular rate and rhythm today.  Continue management per cardiology.  Low vitamin D level Check vitamin D.   Morbid obesity (HCC) BMI 38.5 with multiple comorbidities.  She is working on lifestyle inventions however she is interested in possible GLP-1 agonist therapy.  Due to her comorbidities, believe this would be a good addition for her.  We did discuss potential side effects of medications.  She would like to try [facility].  Will start low-dose 0.25 milligrams weekly.  She will send a message in a few weeks to let us know how she is doing we can increase the dose as tolerated.  Preventative Healthcare: Check labs.  She is due for mammogram and colonoscopy however declined each of these.  We did discuss benefits of early detection.  She will get vaccines at the pharmacy.  Check bone density scan.  Patient Counseling(The following topics were reviewed and/or handout was given):  -Nutrition: Stressed importance of moderation in sodium/caffeine intake, saturated fat and cholesterol, caloric balance, sufficient intake of fresh fruits, vegetables, and fiber.  -Stressed the importance of regular exercise.   -Substance Abuse: Discussed cessation/primary prevention of tobacco, alcohol, or other drug use; driving or other dangerous activities under the influence; availability of treatment for abuse.   -Injury prevention: Discussed  safety belts, safety helmets, smoke detector, smoking near bedding or upholstery.   -Sexuality: Discussed sexually transmitted diseases, partner selection, use of condoms, avoidance of unintended pregnancy and contraceptive alternatives.   -Dental health: Discussed importance of regular tooth brushing, flossing, and dental visits.  -Health maintenance and immunizations reviewed. Please refer to Health maintenance section.  Return to care in 1 year for next preventative visit.     Subjective:  HPI:  She has no acute complaints today. See Assessment / plan for status of chronic conditions.   Lifestyle Diet: Balanced. Trying to get plenty of fruits and vegetables.  Exercise: Trying to get in more steps during the day.      01/22/2023   11:06 AM  Depression screen PHQ 2/9  Decreased Interest 0  Down, Depressed, Hopeless 0  PHQ - 2 Score 0    Health Maintenance Due  Topic Date Due   DEXA SCAN  Never done   Medicare Annual Wellness (AWV)  09/29/2023     ROS: Per HPI, otherwise a complete review of systems was negative.   PMH:  The following were reviewed and entered/updated in epic: Past Medical History:  Diagnosis Date   ADD (attention deficit disorder)    AICD (automatic cardioverter/defibrillator) present 2015-08-20   Anxiety    "just when my daddy died a few years ago" (Aug 20, 2015)   AR (allergic rhinitis)    Chicken pox    Chronic combined systolic and diastolic CHF (congestive heart failure) (HCC)    Depression    "just when my daddy died a few years ago" (2015/08/20)   Dyslipidemia    Environmental allergies  H/O vitamin D deficiency    LBBB (left bundle branch block)    W/1st degree AV Block, Avoid Beta Blockers   NICM (nonischemic cardiomyopathy) (HCC)    OSA (obstructive sleep apnea) 03/18/2015   Severe OSA with AHI 30/hr; "suppose to wear mask but don't" (08/16/2015)   Seasonal affective disorder [facility])    Patient Active Problem List   Diagnosis Date  Noted   Morbid obesity (HCC) 09/03/2023   Insomnia 07/11/2022   Low vitamin D level 07/11/2022   CHF (congestive heart failure) (HCC) 07/10/2022   Overactive bladder 05/30/2022   PAF (paroxysmal atrial fibrillation) (HCC) SCAF 07/06/2021   ICD (implantable cardioverter-defibrillator) in place 04/19/2020   Anxiety and depression 09/21/2016   OSA (obstructive sleep apnea) 03/18/2015   Chronic systolic heart failure (HCC) 12/07/2014   Nonischemic dilated cardiomyopathy (HCC)    LBBB (left bundle branch block)    Dyslipidemia    Hot flashes, menopausal 09/18/2014   ADHD 09/06/2013   Environmental allergies 09/06/2013   Past Surgical History:  Procedure Laterality Date   BIV ICD GENERATOR CHANGEOUT N/A 01/10/2023   Procedure: BIV ICD GENERATOR CHANGEOUT;  Surgeon: Duke Salvia, MD;  Location: [facility] INVASIVE CV LAB;  Service: Cardiovascular;  Laterality: N/A;   CARDIAC CATHETERIZATION     DILATION AND CURETTAGE OF UTERUS  2003   EP IMPLANTABLE DEVICE N/A 08/16/2015   Procedure: BiV ICD Insertion CRT-D;  Surgeon: Duke Salvia, MD;  Location: [facility] INVASIVE CV LAB;  Service: Cardiovascular;  Laterality: N/A;   LEFT AND RIGHT HEART CATHETERIZATION WITH CORONARY ANGIOGRAM N/A 11/17/2014   Procedure: LEFT AND RIGHT HEART CATHETERIZATION WITH CORONARY ANGIOGRAM;  Surgeon: Lennette Bihari, MD;  Location: [facility] CATH LAB;  Service: Cardiovascular;  Laterality: N/A;   TOTAL ABDOMINAL HYSTERECTOMY  2004   TUBAL LIGATION     WISDOM TOOTH EXTRACTION  1986    Family History  Problem Relation Age of Onset   COPD Mother        Living   Pancreatic cancer Father 37       Deceased   Lung cancer Paternal Grandfather    Heart disease Paternal Grandmother    Hypertension Paternal Grandmother    Alzheimer's disease Maternal Grandfather    Arthritis/Rheumatoid Maternal Grandmother    Heart disease Maternal Grandmother        Tachycardia   Cancer Paternal Uncle    Healthy Brother        x3   Healthy  Daughter        x3   Breast cancer Sister 20    Medications- reviewed and updated Current Outpatient Medications  Medication Sig Dispense Refill   b complex vitamins tablet Take 1 tablet by mouth daily.     buPROPion (WELLBUTRIN XL) 300 MG 24 hr tablet Take 1 tablet (300 mg total) by mouth daily. 90 tablet 3   carvedilol (COREG) 6.25 MG tablet Take 1 tablet (6.25 mg total) by mouth 2 (two) times daily. 180 tablet 3   Cholecalciferol (VITAMIN D PO) Take 2,000 mg by mouth daily.      fexofenadine (ALLEGRA) 180 MG tablet Take 180 mg by mouth daily.     fluticasone (FLONASE) 50 MCG/ACT nasal spray Place 1 spray into both nostrils daily.     furosemide (LASIX) 40 MG tablet Take 1 tablet (40 mg total) by mouth daily. 90 tablet 3   potassium chloride (KLOR-CON) 10 MEQ tablet Take 1 tablet (10 mEq total) by mouth daily. 90 tablet 3  Semaglutide-Weight Management (WEGOVY) 0.25 MG/0.5ML SOAJ Inject 0.25 mg into the skin once a week. 2 mL 0   spironolactone (ALDACTONE) 25 MG tablet Take 1 tablet (25 mg total) by mouth daily. 45 tablet 3   lisdexamfetamine (VYVANSE) 50 MG capsule Take 1 capsule (50 mg total) by mouth daily. 30 capsule 0   No current facility-administered medications for this visit.    Allergies-reviewed and updated Allergies  Allergen Reactions   Bee Venom Anaphylaxis   Codeine Nausea And Vomiting   Losartan Other (See Comments)    Pt reports cough and insomnia.   Erythromycin Itching and Rash   Lovastatin Other (See Comments)    weakness   Nickel Rash   Penicillins Hives, Itching and Rash   Simvastatin Other (See Comments)    weakness    Social History   Socioeconomic History   Marital status: Divorced    Spouse name: Not on file   Number of children: Not on file   Years of education: Not on file   Highest education level: Some college, no degree  Occupational History   Not on file  Tobacco Use   Smoking status: Never   Smokeless tobacco: Never  Vaping Use    Vaping status: Never Used  Substance and Sexual Activity   Alcohol use: Yes    Alcohol/week: 4.0 standard drinks of alcohol    Types: 4 Glasses of wine per week   Drug use: No   Sexual activity: Not Currently  Other Topics Concern   Not on file  Social History Narrative   Not on file   Social Determinants of Health   Financial Resource Strain: Low Risk  (09/03/2023)   Overall Financial Resource Strain (CARDIA)    Difficulty of Paying Living Expenses: Not very hard  Food Insecurity: No Food Insecurity (09/03/2023)   Hunger Vital Sign    Worried About Running Out of Food in the Last Year: Never true    Ran Out of Food in the Last Year: Never true  Transportation Needs: No Transportation Needs (09/03/2023)   PRAPARE - Administrator, Civil Service (Medical): No    Lack of Transportation (Non-Medical): No  Physical Activity: Insufficiently Active (09/03/2023)   Exercise Vital Sign    Days of Exercise per Week: 3 days    Minutes of Exercise per Session: 30 min  Stress: Stress Concern Present (09/03/2023)   [employer] of Occupational Health - Occupational Stress Questionnaire    Feeling of Stress : Very much  Social Connections: Moderately Isolated (09/03/2023)   Social Connection and Isolation Panel [NHANES]    Frequency of Communication with Friends and Family: More than three times a week    Frequency of Social Gatherings with Friends and Family: Twice a week    Attends Religious Services: 1 to 4 times per year    Active Member of [employer] or Organizations: No    Attends Engineer, structural: Never    Marital Status: Divorced        Objective:  Physical Exam: BP 135/81   Pulse 76   Temp (!) 97.5 F (36.4 C) (Temporal)   Ht 5' 5.5" (1.664 m)   Wt 235 lb (106.6 kg)   SpO2 96%   BMI 38.51 kg/m   Body mass index is 38.51 kg/m. Wt Readings from Last 3 Encounters:  09/03/23 235 lb (106.6 kg)  05/03/23 232 lb 9.6 oz (105.5 kg)  01/22/23 229 lb 6.4  oz (104.1 kg)  Gen: NAD, resting comfortably HEENT: TMs normal bilaterally. OP clear. No thyromegaly noted.  CV: RRR with no murmurs appreciated Pulm: NWOB, CTAB with no crackles, wheezes, or rhonchi GI: Normal bowel sounds present. Soft, Nontender, Nondistended. MSK: no edema, cyanosis, or clubbing noted Skin: warm, dry Neuro: CN2-12 grossly intact. Strength 5/5 in upper and lower extremities. Reflexes symmetric and intact bilaterally.  Psych: Normal affect and thought content     Ladarrious Kirksey M. Jimmey Ralph, MD 09/03/2023 8:13 AM

## 2023-09-03 NOTE — Assessment & Plan Note (Signed)
BMI 38.5 with multiple comorbidities.  She is working on lifestyle inventions however she is interested in possible GLP-1 agonist therapy.  Due to her comorbidities, believe this would be a good addition for her.  We did discuss potential side effects of medications.  She would like to try [facility].  Will start low-dose 0.25 milligrams weekly.  She will send a message in a few weeks to let us know how she is doing we can increase the dose as tolerated.

## 2023-09-03 NOTE — Assessment & Plan Note (Signed)
Stable on Wellbutrin 300 mg daily.  Does not need refill today.

## 2023-09-06 NOTE — Progress Notes (Signed)
Cholesterol is better than last year but still borderline elevated.  Her blood sugar is also borderline elevated better than last year.  Do not need to make any changes to her treatment plan.  She should continue to work on diet and exercise and we can recheck in a year or so.  Vitamin D is low.  Recommend she take 5000 IUs once daily.  We should recheck this in 3 to 6 months.  The rest of her labs are all normal and we can recheck everything else in a year or so.

## 2023-09-10 ENCOUNTER — Other Ambulatory Visit: Payer: Self-pay | Admitting: Internal Medicine

## 2023-09-10 ENCOUNTER — Other Ambulatory Visit (HOSPITAL_COMMUNITY): Payer: Self-pay

## 2023-09-10 ENCOUNTER — Telehealth: Payer: Self-pay

## 2023-09-10 NOTE — Telephone Encounter (Signed)
Pharmacy Patient Advocate Encounter   Received notification from CoverMyMeds that prior authorization for Wegovy 0.25MG /0.5ML auto-injectors is required/requested.   Insurance verification completed.   The patient is insured through [facility] .   Per test claim: PA required; PA submitted to above mentioned insurance via CoverMyMeds Key/confirmation #/EOC [id number] Status is pending

## 2023-09-11 NOTE — Telephone Encounter (Signed)
Pharmacy Patient Advocate Encounter  Received notification from [facility] that Prior Authorization for [facility] 0.25MG /0.5ML auto-injectors has been DENIED.  See denial reason below. No denial letter attached in CMM. Will attach denial letter to Media tab once received.   PA #/Case ID/Reference #: [id number]   DENIAL REASON: Plan Exclusion

## 2023-09-12 ENCOUNTER — Encounter: Payer: Self-pay | Admitting: Internal Medicine

## 2023-09-12 MED ORDER — SPIRONOLACTONE 25 MG PO TABS: 25.0000 mg | ORAL_TABLET | Freq: Every day | ORAL | 3 refills | Status: AC

## 2023-10-04 ENCOUNTER — Encounter: Payer: Self-pay | Admitting: Family Medicine

## 2023-10-04 DIAGNOSIS — F988 Other specified behavioral and emotional disorders with onset usually occurring in childhood and adolescence: Secondary | ICD-10-CM

## 2023-10-08 MED ORDER — LISDEXAMFETAMINE DIMESYLATE 50 MG PO CAPS: 50.0000 mg | ORAL_CAPSULE | Freq: Every day | ORAL | 0 refills | Status: DC

## 2023-10-10 ENCOUNTER — Ambulatory Visit: Payer: Medicare Other | Admitting: *Deleted

## 2023-10-10 DIAGNOSIS — Z0000 Encounter for general adult medical examination without abnormal findings: Secondary | ICD-10-CM | POA: Diagnosis not present

## 2023-10-10 NOTE — Patient Instructions (Signed)
 Debra Holloway , Thank you for taking time to come for your Medicare Wellness Visit. I appreciate your ongoing commitment to your health goals. Please review the following plan we discussed and let me know if I can assist you in the future.   Screening recommendations/referrals: Colonoscopy: Education provided Mammogram: Education provided Bone Density: Education provided Recommended yearly ophthalmology/optometry visit for glaucoma screening and checkup Recommended yearly dental visit for hygiene and checkup  Vaccinations: Influenza vaccine: Education provided Pneumococcal vaccine: Education provided Tdap vaccine: Education provided Shingles vaccine: Education provided       Preventive Care 65 Years and Older, Female Preventive care refers to lifestyle choices and visits with your health care provider that can promote health and wellness. What does preventive care include? A yearly physical exam. This is also called an annual well check. Dental exams once or twice a year. Routine eye exams. Ask your health care provider how often you should have your eyes checked. Personal lifestyle choices, including: Daily care of your teeth and gums. Regular physical activity. Eating a healthy diet. Avoiding tobacco and drug use. Limiting alcohol use. Practicing safe sex. Taking low-dose aspirin  every day. Taking vitamin and mineral supplements as recommended by your health care provider. What happens during an annual well check? The services and screenings done by your health care provider during your annual well check will depend on your age, overall health, lifestyle risk factors, and family history of disease. Counseling  Your health care provider may ask you questions about your: Alcohol use. Tobacco use. Drug use. Emotional well-being. Home and relationship well-being. Sexual activity. Eating habits. History of falls. Memory and ability to understand (cognition). Work and work  astronomer. Reproductive health. Screening  You may have the following tests or measurements: Height, weight, and BMI. Blood pressure. Lipid and cholesterol levels. These may be checked every 5 years, or more frequently if you are over 39 years old. Skin check. Lung cancer screening. You may have this screening every year starting at age 84 if you have a 30-pack-year history of smoking and currently smoke or have quit within the past 15 years. Fecal occult blood test (FOBT) of the stool. You may have this test every year starting at age 67. Flexible sigmoidoscopy or colonoscopy. You may have a sigmoidoscopy every 5 years or a colonoscopy every 10 years starting at age 40. Hepatitis C blood test. Hepatitis B blood test. Sexually transmitted disease (STD) testing. Diabetes screening. This is done by checking your blood sugar (glucose) after you have not eaten for a while (fasting). You may have this done every 1-3 years. Bone density scan. This is done to screen for osteoporosis. You may have this done starting at age 70. Mammogram. This may be done every 1-2 years. Talk to your health care provider about how often you should have regular mammograms. Talk with your health care provider about your test results, treatment options, and if necessary, the need for more tests. Vaccines  Your health care provider may recommend certain vaccines, such as: Influenza vaccine. This is recommended every year. Tetanus, diphtheria, and acellular pertussis (Tdap, Td) vaccine. You may need a Td booster every 10 years. Zoster vaccine. You may need this after age 34. Pneumococcal 13-valent conjugate (PCV13) vaccine. One dose is recommended after age 76. Pneumococcal polysaccharide (PPSV23) vaccine. One dose is recommended after age 7. Talk to your health care provider about which screenings and vaccines you need and how often you need them. This information is not intended to replace  advice given to you by  your health care provider. Make sure you discuss any questions you have with your health care provider. Document Released: 10/15/2015 Document Revised: 06/07/2016 Document Reviewed: 07/20/2015 Elsevier Interactive Patient Education  2017 [employer].  Fall Prevention in the Home Falls can cause injuries. They can happen to people of all ages. There are many things you can do to make your home safe and to help prevent falls. What can I do on the outside of my home? Regularly fix the edges of walkways and driveways and fix any cracks. Remove anything that might make you trip as you walk through a door, such as a raised step or threshold. Trim any bushes or trees on the path to your home. Use bright outdoor lighting. Clear any walking paths of anything that might make someone trip, such as rocks or tools. Regularly check to see if handrails are loose or broken. Make sure that both sides of any steps have handrails. Any raised decks and porches should have guardrails on the edges. Have any leaves, snow, or ice cleared regularly. Use sand or salt on walking paths during winter. Clean up any spills in your garage right away. This includes oil or grease spills. What can I do in the bathroom? Use night lights. Install grab bars by the toilet and in the tub and shower. Do not use towel bars as grab bars. Use non-skid mats or decals in the tub or shower. If you need to sit down in the shower, use a plastic, non-slip stool. Keep the floor dry. Clean up any water that spills on the floor as soon as it happens. Remove soap buildup in the tub or shower regularly. Attach bath mats securely with double-sided non-slip rug tape. Do not have throw rugs and other things on the floor that can make you trip. What can I do in the bedroom? Use night lights. Make sure that you have a light by your bed that is easy to reach. Do not use any sheets or blankets that are too big for your bed. They should not hang  down onto the floor. Have a firm chair that has side arms. You can use this for support while you get dressed. Do not have throw rugs and other things on the floor that can make you trip. What can I do in the kitchen? Clean up any spills right away. Avoid walking on wet floors. Keep items that you use a lot in easy-to-reach places. If you need to reach something above you, use a strong step stool that has a grab bar. Keep electrical cords out of the way. Do not use floor polish or wax that makes floors slippery. If you must use wax, use non-skid floor wax. Do not have throw rugs and other things on the floor that can make you trip. What can I do with my stairs? Do not leave any items on the stairs. Make sure that there are handrails on both sides of the stairs and use them. Fix handrails that are broken or loose. Make sure that handrails are as long as the stairways. Check any carpeting to make sure that it is firmly attached to the stairs. Fix any carpet that is loose or worn. Avoid having throw rugs at the top or bottom of the stairs. If you do have throw rugs, attach them to the floor with carpet tape. Make sure that you have a light switch at the top of the stairs and the bottom  of the stairs. If you do not have them, ask someone to add them for you. What else can I do to help prevent falls? Wear shoes that: Do not have high heels. Have rubber bottoms. Are comfortable and fit you well. Are closed at the toe. Do not wear sandals. If you use a stepladder: Make sure that it is fully opened. Do not climb a closed stepladder. Make sure that both sides of the stepladder are locked into place. Ask someone to hold it for you, if possible. Clearly mark and make sure that you can see: Any grab bars or handrails. First and last steps. Where the edge of each step is. Use tools that help you move around (mobility aids) if they are needed. These  include: Canes. Walkers. Scooters. Crutches. Turn on the lights when you go into a dark area. Replace any light bulbs as soon as they burn out. Set up your furniture so you have a clear path. Avoid moving your furniture around. If any of your floors are uneven, fix them. If there are any pets around you, be aware of where they are. Review your medicines with your doctor. Some medicines can make you feel dizzy. This can increase your chance of falling. Ask your doctor what other things that you can do to help prevent falls. This information is not intended to replace advice given to you by your health care provider. Make sure you discuss any questions you have with your health care provider. Document Released: 07/15/2009 Document Revised: 02/24/2016 Document Reviewed: 10/23/2014 Elsevier Interactive Patient Education  2017 [employer].

## 2023-10-10 NOTE — Progress Notes (Signed)
 Subjective:   Debra Holloway is a 68 y.o. female who presents for Medicare Annual (Subsequent) preventive examination.  Visit Complete: Virtual I connected with  Debra Holloway on 10/10/23 by a video and audio enabled telemedicine application and verified that I am speaking with the correct person using two identifiers.  Patient Location: Home  Provider Location: Home Office  I discussed the limitations of evaluation and management by telemedicine. The patient expressed understanding and agreed to proceed.  Vital Signs: Because this visit was a virtual/telehealth visit, some criteria may be missing or patient reported. Any vitals not documented were not able to be obtained and vitals that have been documented are patient reported.  Patient Medicare AWV questionnaire was completed by the patient on 10-09-2023; I have confirmed that all information answered by patient is correct and no changes since this date.  Cardiac Risk Factors include: advanced age (>14men, >55 women);family history of premature cardiovascular disease     Objective:    There were no vitals filed for this visit. There is no height or weight on file to calculate BMI.     10/10/2023   11:03 AM 01/10/2023    7:16 AM 09/28/2022   10:34 AM 04/20/2020   10:31 AM 05/24/2017   10:27 AM 08/16/2015    8:32 AM 03/26/2015    8:35 PM  Advanced Directives  Does Patient Have a Medical Advance Directive? Yes Yes Yes No No No No  Type of Estate Agent of [employer] Power of [place];Living will Healthcare Power of [place];Living will      Does patient want to make changes to medical advance directive?  No - Patient declined       Copy of Healthcare Power of Attorney in Chart? No - copy requested No - copy requested No - copy requested      Would patient like information on creating a medical advance directive?    No - Patient declined  No - patient declined information No - patient declined  information    Current Medications (verified) Outpatient Encounter Medications as of 10/10/2023  Medication Sig   b complex vitamins tablet Take 1 tablet by mouth daily.   buPROPion  (WELLBUTRIN  XL) 300 MG 24 hr tablet Take 1 tablet (300 mg total) by mouth daily.   carvedilol  (COREG ) 6.25 MG tablet Take 1 tablet (6.25 mg total) by mouth 2 (two) times daily.   Cholecalciferol  (VITAMIN D  PO) Take 2,000 mg by mouth daily.    fexofenadine (ALLEGRA) 180 MG tablet Take 180 mg by mouth daily.   fluticasone  (FLONASE ) 50 MCG/ACT nasal spray Place 1 spray into both nostrils daily.   furosemide  (LASIX ) 40 MG tablet Take 1 tablet (40 mg total) by mouth daily.   lisdexamfetamine (VYVANSE ) 50 MG capsule Take 1 capsule (50 mg total) by mouth daily.   potassium chloride  (KLOR-CON ) 10 MEQ tablet Take 1 tablet (10 mEq total) by mouth daily.   spironolactone  (ALDACTONE ) 25 MG tablet Take 1 tablet (25 mg total) by mouth daily.   Semaglutide -Weight Management (WEGOVY ) 0.25 MG/0.5ML SOAJ Inject 0.25 mg into the skin once a week.   No facility-administered encounter medications on file as of 10/10/2023.    Allergies (verified) Bee venom, Codeine, Losartan , Erythromycin, Lovastatin , Nickel, Penicillins, and Simvastatin   History: Past Medical History:  Diagnosis Date   ADD (attention deficit disorder)    AICD (automatic cardioverter/defibrillator) present 08/16/2015   Anxiety    just when my daddy died a few years  ago (09/02/2015)   AR (allergic rhinitis)    Chicken pox    Chronic combined systolic and diastolic CHF (congestive heart failure) (HCC)    Depression    just when my daddy died a few years ago (2015/09/02)   Dyslipidemia    Environmental allergies    H/O vitamin D  deficiency    LBBB (left bundle branch block)    W/1st degree AV Block, Avoid Beta Blockers   NICM (nonischemic cardiomyopathy) (HCC)    OSA (obstructive sleep apnea) 03/18/2015   Severe OSA with AHI 30/hr; suppose to wear mask  but don't (09-02-2015)   Seasonal affective disorder (HCC)    Past Surgical History:  Procedure Laterality Date   BIV ICD GENERATOR CHANGEOUT N/A 01/10/2023   Procedure: BIV ICD GENERATOR CHANGEOUT;  Surgeon: Fernande Elspeth BROCKS, MD;  Location: [facility] INVASIVE CV LAB;  Service: Cardiovascular;  Laterality: N/A;   CARDIAC CATHETERIZATION     DILATION AND CURETTAGE OF UTERUS  2003   EP IMPLANTABLE DEVICE N/A 02-Sep-2015   Procedure: BiV ICD Insertion CRT-D;  Surgeon: Elspeth BROCKS Fernande, MD;  Location: [facility] INVASIVE CV LAB;  Service: Cardiovascular;  Laterality: N/A;   LEFT AND RIGHT HEART CATHETERIZATION WITH CORONARY ANGIOGRAM N/A 11/17/2014   Procedure: LEFT AND RIGHT HEART CATHETERIZATION WITH CORONARY ANGIOGRAM;  Surgeon: Debby DELENA Sor, MD;  Location: [facility] CATH LAB;  Service: Cardiovascular;  Laterality: N/A;   TOTAL ABDOMINAL HYSTERECTOMY  2004   TUBAL LIGATION     WISDOM TOOTH EXTRACTION  1986   Family History  Problem Relation Age of Onset   COPD Mother        Living   Pancreatic cancer Father 61       Deceased   Lung cancer Paternal Grandfather    Heart disease Paternal Grandmother    Hypertension Paternal Grandmother    Alzheimer's disease Maternal Grandfather    Arthritis/Rheumatoid Maternal Grandmother    Heart disease Maternal Grandmother        Tachycardia   Cancer Paternal Uncle    Healthy Brother        x3   Healthy Daughter        x3   Breast cancer Sister 61   Social History   Socioeconomic History   Marital status: Divorced    Spouse name: Not on file   Number of children: Not on file   Years of education: Not on file   Highest education level: Some college, no degree  Occupational History   Not on file  Tobacco Use   Smoking status: Never   Smokeless tobacco: Never  Vaping Use   Vaping status: Never Used  Substance and Sexual Activity   Alcohol use: Yes    Alcohol/week: 4.0 standard drinks of alcohol    Types: 4 Glasses of wine per week   Drug use: No   Sexual  activity: Not Currently  Other Topics Concern   Not on file  Social History Narrative   Not on file   Social Drivers of Health   Financial Resource Strain: Low Risk  (10/10/2023)   Overall Financial Resource Strain (CARDIA)    Difficulty of Paying Living Expenses: Not very hard  Food Insecurity: No Food Insecurity (10/10/2023)   Hunger Vital Sign    Worried About Running Out of Food in the Last Year: Never true    Ran Out of Food in the Last Year: Never true  Transportation Needs: No Transportation Needs (10/10/2023)   PRAPARE - Transportation  Lack of Transportation (Medical): No    Lack of Transportation (Non-Medical): No  Physical Activity: Insufficiently Active (10/10/2023)   Exercise Vital Sign    Days of Exercise per Week: 3 days    Minutes of Exercise per Session: 30 min  Stress: Stress Concern Present (10/10/2023)   [employer] of Occupational Health - Occupational Stress Questionnaire    Feeling of Stress : Very much  Social Connections: Moderately Isolated (10/10/2023)   Social Connection and Isolation Panel [NHANES]    Frequency of Communication with Friends and Family: More than three times a week    Frequency of Social Gatherings with Friends and Family: Twice a week    Attends Religious Services: 1 to 4 times per year    Active Member of [employer] or Organizations: No    Attends Engineer, Structural: Never    Marital Status: Divorced    Tobacco Counseling Counseling given: Not Answered   Clinical Intake:  Pre-visit preparation completed: Yes  Pain : No/denies pain     Diabetes: No  How often do you need to have someone help you when you read instructions, pamphlets, or other written materials from your doctor or pharmacy?: 1 - Never  Interpreter Needed?: No  Information entered by :: Mliss Graff LPN   Activities of Daily Living    10/10/2023   11:27 AM 10/09/2023   10:08 AM  In your present state of health, do you have any difficulty performing  the following activities:  Hearing? 0 0  Vision? 0 0  Difficulty concentrating or making decisions? 0 0  Walking or climbing stairs? 0 0  Dressing or bathing? 0 0  Doing errands, shopping? 0 0  Preparing Food and eating ? N N  Using the Toilet? N N  In the past six months, have you accidently leaked urine? Y Y  Do you have problems with loss of bowel control? N N  Managing your Medications? N N  Managing your Finances? N N  Housekeeping or managing your Housekeeping? N N    Patient Care Team: Kennyth Worth HERO, MD as PCP - General (Family Medicine) Maranda Leim DEL, MD as PCP - Cardiology (Cardiology) Fernande Elspeth BROCKS, MD as PCP - Electrophysiology (Cardiology) Pandora Cadet, [facility] (Inactive) as Pharmacist (Pharmacist)  Indicate any recent Medical Services you may have received from other than [facility] providers in the past year (date may be approximate).     Assessment:   This is a routine wellness examination for Debra Holloway.  Hearing/Vision screen Hearing Screening - Comments:: No trouble hearing Vision Screening - Comments:: Up to date [employer]  Sees Retina Specialist in [place]     Goals Addressed             This Visit's Progress    Patient Stated   Not on track    Would like to be more active & get out of the house more     Weight (lb) < 200 lb (90.7 kg)         Depression Screen    10/10/2023   11:03 AM 01/22/2023   11:06 AM 09/28/2022   10:31 AM 07/11/2022    8:50 AM 05/30/2022    8:06 AM 05/03/2022    2:21 PM 07/06/2021    8:20 AM  PHQ 2/9 Scores  PHQ - 2 Score 2 0 1 2 3  0 3  PHQ- 9 Score 9   15 16  0 17    Fall Risk  10/09/2023   10:08 AM 01/22/2023   11:06 AM 09/28/2022   10:35 AM 07/11/2022    7:40 AM 05/30/2022    7:37 AM  Fall Risk   Falls in the past year? 1 0 0 0 0  Number falls in past yr: 0 0 0 0 0  Injury with Fall? 1 0 0 0 0  Risk for fall due to :  No Fall Risks Impaired vision No Fall Risks No Fall Risks  Follow up   Falls  prevention discussed      MEDICARE RISK AT HOME: Medicare Risk at Home Any stairs in or around the home?: No If so, are there any without handrails?: No Home free of loose throw rugs in walkways, pet beds, electrical cords, etc?: Yes Adequate lighting in your home to reduce risk of falls?: Yes Life alert?: No Use of a cane, walker or w/c?: No Grab bars in the bathroom?: Yes Shower chair or bench in shower?: Yes Elevated toilet seat or a handicapped toilet?: No  TIMED UP AND GO:  Was the test performed?  No    Cognitive Function:        10/10/2023   11:00 AM 09/28/2022   10:37 AM  6CIT Screen  What Year? 0 points 0 points  What month? 0 points 0 points  What time? 0 points 0 points  Count back from 20 0 points 0 points  Months in reverse 0 points 0 points  Repeat phrase 0 points 0 points  Total Score 0 points 0 points    Immunizations Immunization History  Administered Date(s) Administered   Influenza,inj,Quad PF,6+ Mos 09/07/2014, 07/30/2018   Tdap 09/01/2013    TDAP status: Due, Education has been provided regarding the importance of this vaccine. Advised may receive this vaccine at local pharmacy or Health Dept. Aware to provide a copy of the vaccination record if obtained from local pharmacy or Health Dept. Verbalized acceptance and understanding.  Flu Vaccine status: Due, Education has been provided regarding the importance of this vaccine. Advised may receive this vaccine at local pharmacy or Health Dept. Aware to provide a copy of the vaccination record if obtained from local pharmacy or Health Dept. Verbalized acceptance and understanding.  Pneumococcal vaccine status: Due, Education has been provided regarding the importance of this vaccine. Advised may receive this vaccine at local pharmacy or Health Dept. Aware to provide a copy of the vaccination record if obtained from local pharmacy or Health Dept. Verbalized acceptance and understanding.  Covid-19 vaccine  status: Information provided on how to obtain vaccines.   Qualifies for Shingles Vaccine? Yes   Zostavax completed No   Shingrix Completed?: No.    Education has been provided regarding the importance of this vaccine. Patient has been advised to call insurance company to determine out of pocket expense if they have not yet received this vaccine. Advised may also receive vaccine at local pharmacy or Health Dept. Verbalized acceptance and understanding.  Screening Tests Health Maintenance  Topic Date Due   DEXA SCAN  Never done   Zoster Vaccines- Shingrix (1 of 2) 12/02/2023 (Originally 10/02/2007)   INFLUENZA VACCINE  12/31/2023 (Originally 05/03/2023)   Pneumonia Vaccine 31+ Years old (1 of 2 - PCV) 01/22/2024 (Originally 10/02/1962)   DTaP/Tdap/Td (2 - Td or Tdap) 09/02/2024 (Originally 09/02/2023)   MAMMOGRAM  09/02/2024 (Originally 11/30/2017)   Colonoscopy  09/02/2024 (Originally 10/01/2002)   Medicare Annual Wellness (AWV)  10/09/2024   Hepatitis C Screening  Completed   HPV  VACCINES  Aged Out   COVID-19 Vaccine  Discontinued    Health Maintenance  Health Maintenance Due  Topic Date Due   DEXA SCAN  Never done    Colonoscopy   Education provided  Mammogram status: Completed  . Repeat every year  Bone Density status: Ordered  . Pt provided with contact info and advised to call to schedule appt.  Lung Cancer Screening: (Low Dose CT Chest recommended if Age 56-80 years, 20 pack-year currently smoking OR have quit w/in 15years.) does not qualify.   Lung Cancer Screening Referral:   Additional Screening:  Hepatitis C Screening: does not qualify; Completed 2016  Vision Screening: Recommended annual ophthalmology exams for early detection of glaucoma and other disorders of the eye. Is the patient up to date with their annual eye exam?  Yes  Who is the provider or what is the name of the office in which the patient attends annual eye exams? [facility] If pt is not established  with a provider, would they like to be referred to a provider to establish care? No .   Dental Screening: Recommended annual dental exams for proper oral hygiene    Community Resource Referral / Chronic Care Management: CRR required this visit?  No   CCM required this visit?  No     Plan:     I have personally reviewed and noted the following in the patient's chart:   Medical and social history Use of alcohol, tobacco or illicit drugs  Current medications and supplements including opioid prescriptions. Patient is not currently taking opioid prescriptions. Functional ability and status Nutritional status Physical activity Advanced directives List of other physicians Hospitalizations, surgeries, and ER visits in previous 12 months Vitals Screenings to include cognitive, depression, and falls Referrals and appointments  In addition, I have reviewed and discussed with patient certain preventive protocols, quality metrics, and best practice recommendations. A written personalized care plan for preventive services as well as general preventive health recommendations were provided to patient.     Mliss Graff, LPN   05/02/7973   After Visit Summary: (MyChart) Due to this being a telephonic visit, the after visit summary with patients personalized plan was offered to patient via MyChart   Nurse Notes:

## 2023-10-16 ENCOUNTER — Ambulatory Visit (INDEPENDENT_AMBULATORY_CARE_PROVIDER_SITE_OTHER): Payer: Medicare Other

## 2023-10-16 DIAGNOSIS — I428 Other cardiomyopathies: Secondary | ICD-10-CM

## 2023-10-16 LAB — CUP PACEART REMOTE DEVICE CHECK
Battery Remaining Longevity: 103 mo
Battery Voltage: 3.01 V
Brady Statistic RV Percent Paced: 97.65 %
Date Time Interrogation Session: 20250114014011
HighPow Impedance: 73 Ohm
Implantable Lead Connection Status: 753985
Implantable Lead Connection Status: 753985
Implantable Lead Connection Status: 753985
Implantable Lead Implant Date: 20161114
Implantable Lead Implant Date: 20161114
Implantable Lead Implant Date: 20161114
Implantable Lead Location: 753858
Implantable Lead Location: 753859
Implantable Lead Location: 753860
Implantable Lead Model: 4598
Implantable Lead Model: 5076
Implantable Pulse Generator Implant Date: 20240410
Lead Channel Impedance Value: 1064 Ohm
Lead Channel Impedance Value: 1102 Ohm
Lead Channel Impedance Value: 1159 Ohm
Lead Channel Impedance Value: 361 Ohm
Lead Channel Impedance Value: 418 Ohm
Lead Channel Impedance Value: 437 Ohm
Lead Channel Impedance Value: 475 Ohm
Lead Channel Impedance Value: 513 Ohm
Lead Channel Impedance Value: 513 Ohm
Lead Channel Impedance Value: 665 Ohm
Lead Channel Impedance Value: 684 Ohm
Lead Channel Impedance Value: 912 Ohm
Lead Channel Impedance Value: 931 Ohm
Lead Channel Pacing Threshold Amplitude: 0.625 V
Lead Channel Pacing Threshold Amplitude: 0.75 V
Lead Channel Pacing Threshold Amplitude: 1 V
Lead Channel Pacing Threshold Pulse Width: 0.4 ms
Lead Channel Pacing Threshold Pulse Width: 0.4 ms
Lead Channel Pacing Threshold Pulse Width: 0.7 ms
Lead Channel Sensing Intrinsic Amplitude: 2.8 mV
Lead Channel Sensing Intrinsic Amplitude: 22.5 mV
Lead Channel Setting Pacing Amplitude: 1.5 V
Lead Channel Setting Pacing Amplitude: 1.5 V
Lead Channel Setting Pacing Amplitude: 2 V
Lead Channel Setting Pacing Pulse Width: 0.4 ms
Lead Channel Setting Pacing Pulse Width: 0.7 ms
Lead Channel Setting Sensing Sensitivity: 0.3 mV
Zone Setting Status: 755011
Zone Setting Status: 755011

## 2023-10-24 ENCOUNTER — Encounter: Payer: Self-pay | Admitting: Family Medicine

## 2023-11-13 ENCOUNTER — Encounter: Payer: Self-pay | Admitting: Family Medicine

## 2023-11-13 DIAGNOSIS — F988 Other specified behavioral and emotional disorders with onset usually occurring in childhood and adolescence: Secondary | ICD-10-CM

## 2023-11-15 MED ORDER — LISDEXAMFETAMINE DIMESYLATE 50 MG PO CAPS: 50.0000 mg | ORAL_CAPSULE | Freq: Every day | ORAL | 0 refills | Status: DC

## 2023-11-16 DIAGNOSIS — H33322 Round hole, left eye: Secondary | ICD-10-CM | POA: Diagnosis not present

## 2023-11-16 DIAGNOSIS — H33101 Unspecified retinoschisis, right eye: Secondary | ICD-10-CM | POA: Diagnosis not present

## 2023-11-16 DIAGNOSIS — H43813 Vitreous degeneration, bilateral: Secondary | ICD-10-CM | POA: Diagnosis not present

## 2023-11-16 DIAGNOSIS — H5203 Hypermetropia, bilateral: Secondary | ICD-10-CM | POA: Diagnosis not present

## 2023-11-16 DIAGNOSIS — H1045 Other chronic allergic conjunctivitis: Secondary | ICD-10-CM | POA: Diagnosis not present

## 2023-11-16 DIAGNOSIS — H04129 Dry eye syndrome of unspecified lacrimal gland: Secondary | ICD-10-CM | POA: Diagnosis not present

## 2023-11-16 DIAGNOSIS — H3540 Unspecified peripheral retinal degeneration: Secondary | ICD-10-CM | POA: Diagnosis not present

## 2023-11-16 DIAGNOSIS — H04123 Dry eye syndrome of bilateral lacrimal glands: Secondary | ICD-10-CM | POA: Diagnosis not present

## 2023-11-16 DIAGNOSIS — H2513 Age-related nuclear cataract, bilateral: Secondary | ICD-10-CM | POA: Diagnosis not present

## 2023-11-28 ENCOUNTER — Encounter: Payer: Self-pay | Admitting: Family Medicine

## 2023-11-28 ENCOUNTER — Encounter: Payer: Self-pay | Admitting: Internal Medicine

## 2023-11-28 NOTE — Progress Notes (Signed)
 Remote ICD transmission.

## 2023-11-29 ENCOUNTER — Other Ambulatory Visit: Payer: Self-pay

## 2023-11-29 DIAGNOSIS — F988 Other specified behavioral and emotional disorders with onset usually occurring in childhood and adolescence: Secondary | ICD-10-CM

## 2023-11-29 MED ORDER — LISDEXAMFETAMINE DIMESYLATE 50 MG PO CAPS: 50.0000 mg | ORAL_CAPSULE | Freq: Every day | ORAL | 0 refills | Status: DC

## 2023-11-29 NOTE — Telephone Encounter (Signed)
 Patient sent MyChart message stating [facility] is out of stock and not had meds since 11/14/23. Patient asking script to be sent to this pharmacy to be filled.

## 2023-11-29 NOTE — Telephone Encounter (Signed)
 Encounter sent to PCP to be sent to new pharmacy

## 2023-12-27 ENCOUNTER — Other Ambulatory Visit: Payer: Self-pay | Admitting: Family Medicine

## 2023-12-27 DIAGNOSIS — F988 Other specified behavioral and emotional disorders with onset usually occurring in childhood and adolescence: Secondary | ICD-10-CM

## 2024-01-16 ENCOUNTER — Encounter: Payer: Self-pay | Admitting: Internal Medicine

## 2024-01-21 ENCOUNTER — Ambulatory Visit (INDEPENDENT_AMBULATORY_CARE_PROVIDER_SITE_OTHER)

## 2024-01-21 DIAGNOSIS — I428 Other cardiomyopathies: Secondary | ICD-10-CM | POA: Diagnosis not present

## 2024-01-21 DIAGNOSIS — I5022 Chronic systolic (congestive) heart failure: Secondary | ICD-10-CM

## 2024-01-22 LAB — CUP PACEART REMOTE DEVICE CHECK
Battery Remaining Longevity: 99 mo
Battery Voltage: 3 V
Brady Statistic RV Percent Paced: 97.16 %
Date Time Interrogation Session: 20250421202855
HighPow Impedance: 73 Ohm
Implantable Lead Connection Status: 753985
Implantable Lead Connection Status: 753985
Implantable Lead Connection Status: 753985
Implantable Lead Implant Date: 20161114
Implantable Lead Implant Date: 20161114
Implantable Lead Implant Date: 20161114
Implantable Lead Location: 753858
Implantable Lead Location: 753859
Implantable Lead Location: 753860
Implantable Lead Model: 4598
Implantable Lead Model: 5076
Implantable Pulse Generator Implant Date: 20240410
Lead Channel Impedance Value: 1064 Ohm
Lead Channel Impedance Value: 1083 Ohm
Lead Channel Impedance Value: 1159 Ohm
Lead Channel Impedance Value: 342 Ohm
Lead Channel Impedance Value: 399 Ohm
Lead Channel Impedance Value: 418 Ohm
Lead Channel Impedance Value: 475 Ohm
Lead Channel Impedance Value: 475 Ohm
Lead Channel Impedance Value: 494 Ohm
Lead Channel Impedance Value: 627 Ohm
Lead Channel Impedance Value: 684 Ohm
Lead Channel Impedance Value: 931 Ohm
Lead Channel Impedance Value: 950 Ohm
Lead Channel Pacing Threshold Amplitude: 0.625 V
Lead Channel Pacing Threshold Amplitude: 0.75 V
Lead Channel Pacing Threshold Amplitude: 0.875 V
Lead Channel Pacing Threshold Pulse Width: 0.4 ms
Lead Channel Pacing Threshold Pulse Width: 0.4 ms
Lead Channel Pacing Threshold Pulse Width: 0.7 ms
Lead Channel Sensing Intrinsic Amplitude: 0.8 mV
Lead Channel Sensing Intrinsic Amplitude: 22.9 mV
Lead Channel Setting Pacing Amplitude: 1.5 V
Lead Channel Setting Pacing Amplitude: 1.5 V
Lead Channel Setting Pacing Amplitude: 2 V
Lead Channel Setting Pacing Pulse Width: 0.4 ms
Lead Channel Setting Pacing Pulse Width: 0.7 ms
Lead Channel Setting Sensing Sensitivity: 0.3 mV
Zone Setting Status: 755011
Zone Setting Status: 755011

## 2024-02-10 ENCOUNTER — Encounter: Payer: Self-pay | Admitting: Family Medicine

## 2024-02-10 DIAGNOSIS — F988 Other specified behavioral and emotional disorders with onset usually occurring in childhood and adolescence: Secondary | ICD-10-CM

## 2024-02-12 ENCOUNTER — Other Ambulatory Visit: Payer: Self-pay | Admitting: Family Medicine

## 2024-02-12 ENCOUNTER — Encounter: Payer: Self-pay | Admitting: Family Medicine

## 2024-02-12 DIAGNOSIS — F988 Other specified behavioral and emotional disorders with onset usually occurring in childhood and adolescence: Secondary | ICD-10-CM

## 2024-02-12 MED ORDER — LISDEXAMFETAMINE DIMESYLATE 50 MG PO CAPS: 50.0000 mg | ORAL_CAPSULE | Freq: Every day | ORAL | 0 refills | Status: DC

## 2024-02-12 NOTE — Telephone Encounter (Unsigned)
 Copied from CRM [id number]. Topic: Clinical - Medication Refill >> Feb 12, 2024  1:59 PM Howard Macho wrote: Medication: lisdexamfetamine (VYVANSE ) 50 MG capsule  Has the patient contacted their pharmacy? Yes (Agent: If no, request that the patient contact the pharmacy for the refill. If patient does not wish to contact the pharmacy document the reason why and proceed with request.) (Agent: If yes, when and what did the pharmacy advise?)  This is the patient's preferred pharmacy:  [facility] [place], [place] - [address] [place] [address] [place] [address] Phone: [phone] Fax: [phone]  Is this the correct pharmacy for this prescription? Yes If no, delete pharmacy and type the correct one.   Has the prescription been filled recently? No  Is the patient out of the medication? no  Has the patient been seen for an appointment in the last year OR does the patient have an upcoming appointment? Yes  Can we respond through MyChart? Yes  Agent: Please be advised that Rx refills may take up to 3 business days. We ask that you follow-up with your pharmacy.

## 2024-02-13 MED ORDER — LISDEXAMFETAMINE DIMESYLATE 50 MG PO CAPS: 50.0000 mg | ORAL_CAPSULE | Freq: Every day | ORAL | 0 refills | Status: DC

## 2024-03-12 ENCOUNTER — Encounter: Payer: Self-pay | Admitting: Family Medicine

## 2024-03-12 NOTE — Progress Notes (Signed)
 Remote ICD transmission.

## 2024-03-12 NOTE — Addendum Note (Signed)
 Addended by: Edra Govern D on: 03/12/2024 12:19 PM   Modules accepted: Orders

## 2024-03-17 ENCOUNTER — Encounter: Payer: Self-pay | Admitting: Family Medicine

## 2024-03-17 ENCOUNTER — Ambulatory Visit (INDEPENDENT_AMBULATORY_CARE_PROVIDER_SITE_OTHER): Admitting: Family Medicine

## 2024-03-17 VITALS — BP 122/80 | HR 77 | Temp 97.0°F | Ht 65.5 in | Wt 232.2 lb

## 2024-03-17 DIAGNOSIS — Z9109 Other allergy status, other than to drugs and biological substances: Secondary | ICD-10-CM | POA: Diagnosis not present

## 2024-03-17 DIAGNOSIS — F909 Attention-deficit hyperactivity disorder, unspecified type: Secondary | ICD-10-CM | POA: Diagnosis not present

## 2024-03-17 DIAGNOSIS — F988 Other specified behavioral and emotional disorders with onset usually occurring in childhood and adolescence: Secondary | ICD-10-CM | POA: Diagnosis not present

## 2024-03-17 DIAGNOSIS — F419 Anxiety disorder, unspecified: Secondary | ICD-10-CM

## 2024-03-17 DIAGNOSIS — F32A Depression, unspecified: Secondary | ICD-10-CM

## 2024-03-17 MED ORDER — MONTELUKAST SODIUM 10 MG PO TABS: 10.0000 mg | ORAL_TABLET | Freq: Every day | ORAL | 3 refills | Status: DC

## 2024-03-17 MED ORDER — LISDEXAMFETAMINE DIMESYLATE 50 MG PO CAPS: 50.0000 mg | ORAL_CAPSULE | Freq: Every day | ORAL | 0 refills | Status: DC

## 2024-03-17 NOTE — Patient Instructions (Signed)
 It was very nice to see you today!  We will start Singulair .  Please send a message in a few weeks Lodalis is working.  I will refill your Vyvanse  today.   Return in about 6 months (around 09/16/2024) for Annual Physical.   Take care, Dr Daneil Dunker  PLEASE NOTE:  If you had any lab tests, please let us  know if you have not heard back within a few days. You may see your results on mychart before we have a chance to review them but we will give you a call once they are reviewed by us .   If we ordered any referrals today, please let us  know if you have not heard from their office within the next week.   If you had any urgent prescriptions sent in today, please check with the pharmacy within an hour of our visit to make sure the prescription was transmitted appropriately.   Please try these tips to maintain a healthy lifestyle:  Eat at least 3 REAL meals and 1-2 snacks per day.  Aim for no more than 5 hours between eating.  If you eat breakfast, please do so within one hour of getting up.   Each meal should contain half fruits/vegetables, one quarter protein, and one quarter carbs (no bigger than a computer mouse)  Cut down on sweet beverages. This includes juice, soda, and sweet tea.   Drink at least 1 glass of water with each meal and aim for at least 8 glasses per day  Exercise at least 150 minutes every week.

## 2024-03-17 NOTE — Assessment & Plan Note (Signed)
 Stable on Vyvanse  50 mg daily.  Medications help with ability to stay focused and on task.  No significant side effects.  Refill today.  Follow-up in 6 months.

## 2024-03-17 NOTE — Assessment & Plan Note (Signed)
 On Flonase  and Astelin though still having some ongoing issues with seasonal allergies.  Also having excessive itching with insect bites as well.  We did discuss referral to see an allergist however she would like to hold off on this for now.  We will add on Singulair .  We discussed potential side effects.  She will let us  know if not proving in a few weeks and we can refer to ENT at that time.

## 2024-03-17 NOTE — Assessment & Plan Note (Signed)
 Symptoms are manageable on Wellbutrin  300 mg daily though still does have some depressive symptoms.  We did discuss increasing the dose however she would like to hold off on this for now.  Will recheck again in 3 to 6 months however she will let us  know if she changes her mind in the interim.

## 2024-03-17 NOTE — Progress Notes (Signed)
   Debra Holloway is a 68 y.o. female who presents today for an office visit.  Assessment/Plan:  Chronic Problems Addressed Today: ADHD Stable on Vyvanse  50 mg daily.  Medications help with ability to stay focused and on task.  No significant side effects.  Refill today.  Follow-up in 6 months.  Environmental allergies On Flonase  and Astelin though still having some ongoing issues with seasonal allergies.  Also having excessive itching with insect bites as well.  We did discuss referral to see an allergist however she would like to hold off on this for now.  We will add on Singulair .  We discussed potential side effects.  She will let us  know if not proving in a few weeks and we can refer to ENT at that time.  Anxiety and depression Symptoms are manageable on Wellbutrin  300 mg daily though still does have some depressive symptoms.  We did discuss increasing the dose however she would like to hold off on this for now.  Will recheck again in 3 to 6 months however she will let us  know if she changes her mind in the interim.     Subjective:  HPI:  See Assessment / plan for status of chronic conditions.  Patient here today for follow-up.  She is doing well today.  She has noticed more issues with allergies recently.  Currently on Flonase  and Allegra for this.    Overall feels like the Vyvanse  is working well to manage her ADHD symptoms.       Objective:  Physical Exam: BP 122/80   Pulse 77   Temp (!) 97 F (36.1 C) (Temporal)   Ht 5' 5.5 (1.664 m)   Wt 232 lb 3.2 oz (105.3 kg)   SpO2 97%   BMI 38.05 kg/m   Gen: No acute distress, resting comfortably CV: Regular rate and rhythm with no murmurs appreciated Pulm: Normal work of breathing, clear to auscultation bilaterally with no crackles, wheezes, or rhonchi Neuro: Grossly normal, moves all extremities Psych: Normal affect and thought content      Carles Florea M. Daneil Dunker, MD 03/17/2024 9:56 AM

## 2024-04-20 ENCOUNTER — Encounter: Payer: Self-pay | Admitting: Family Medicine

## 2024-04-21 ENCOUNTER — Ambulatory Visit

## 2024-04-21 DIAGNOSIS — I428 Other cardiomyopathies: Secondary | ICD-10-CM | POA: Diagnosis not present

## 2024-04-21 NOTE — Telephone Encounter (Signed)
 I appreciate the update.  I am glad to hear that the Singulair  is going well.  She can continue with the current dose for now and she should let us  know if she runs into any issues.

## 2024-04-21 NOTE — Telephone Encounter (Signed)
 See message.

## 2024-04-23 LAB — CUP PACEART REMOTE DEVICE CHECK
Battery Remaining Longevity: 94 mo
Battery Voltage: 2.99 V
Brady Statistic RV Percent Paced: 97.24 %
Date Time Interrogation Session: 20250721043047
HighPow Impedance: 80 Ohm
Implantable Lead Connection Status: 753985
Implantable Lead Connection Status: 753985
Implantable Lead Connection Status: 753985
Implantable Lead Implant Date: 20161114
Implantable Lead Implant Date: 20161114
Implantable Lead Implant Date: 20161114
Implantable Lead Location: 753858
Implantable Lead Location: 753859
Implantable Lead Location: 753860
Implantable Lead Model: 4598
Implantable Lead Model: 5076
Implantable Pulse Generator Implant Date: 20240410
Lead Channel Impedance Value: 1083 Ohm
Lead Channel Impedance Value: 1102 Ohm
Lead Channel Impedance Value: 1216 Ohm
Lead Channel Impedance Value: 361 Ohm
Lead Channel Impedance Value: 399 Ohm
Lead Channel Impedance Value: 399 Ohm
Lead Channel Impedance Value: 437 Ohm
Lead Channel Impedance Value: 437 Ohm
Lead Channel Impedance Value: 513 Ohm
Lead Channel Impedance Value: 589 Ohm
Lead Channel Impedance Value: 722 Ohm
Lead Channel Impedance Value: 874 Ohm
Lead Channel Impedance Value: 912 Ohm
Lead Channel Pacing Threshold Amplitude: 0.75 V
Lead Channel Pacing Threshold Amplitude: 0.75 V
Lead Channel Pacing Threshold Amplitude: 1 V
Lead Channel Pacing Threshold Pulse Width: 0.4 ms
Lead Channel Pacing Threshold Pulse Width: 0.4 ms
Lead Channel Pacing Threshold Pulse Width: 0.7 ms
Lead Channel Sensing Intrinsic Amplitude: 19.1 mV
Lead Channel Sensing Intrinsic Amplitude: 3 mV
Lead Channel Setting Pacing Amplitude: 1.5 V
Lead Channel Setting Pacing Amplitude: 1.5 V
Lead Channel Setting Pacing Amplitude: 2 V
Lead Channel Setting Pacing Pulse Width: 0.4 ms
Lead Channel Setting Pacing Pulse Width: 0.7 ms
Lead Channel Setting Sensing Sensitivity: 0.3 mV
Zone Setting Status: 755011
Zone Setting Status: 755011

## 2024-04-24 ENCOUNTER — Ambulatory Visit: Payer: Self-pay | Admitting: Cardiology

## 2024-04-24 ENCOUNTER — Encounter: Payer: Self-pay | Admitting: Family Medicine

## 2024-04-24 DIAGNOSIS — F988 Other specified behavioral and emotional disorders with onset usually occurring in childhood and adolescence: Secondary | ICD-10-CM

## 2024-04-25 MED ORDER — LISDEXAMFETAMINE DIMESYLATE 50 MG PO CAPS: 50.0000 mg | ORAL_CAPSULE | Freq: Every day | ORAL | 0 refills | Status: DC

## 2024-05-27 ENCOUNTER — Encounter: Payer: Self-pay | Admitting: Family Medicine

## 2024-05-27 DIAGNOSIS — F988 Other specified behavioral and emotional disorders with onset usually occurring in childhood and adolescence: Secondary | ICD-10-CM

## 2024-05-27 MED ORDER — LISDEXAMFETAMINE DIMESYLATE 50 MG PO CAPS
50.0000 mg | ORAL_CAPSULE | Freq: Every day | ORAL | 0 refills | Status: DC
Start: 2024-05-27 — End: 2024-06-30

## 2024-05-27 NOTE — Telephone Encounter (Signed)
 Requesting: Vyvanse  Last Visit: 03/17/2024 Next Visit: 09/19/2024 Last Refill: 04/25/2024  Please Advise

## 2024-05-29 ENCOUNTER — Other Ambulatory Visit: Payer: Self-pay | Admitting: Internal Medicine

## 2024-06-18 ENCOUNTER — Encounter: Payer: Self-pay | Admitting: Internal Medicine

## 2024-06-24 NOTE — Progress Notes (Deleted)
 Cardiology Office Note Date:  06/24/2024  Patient ID:  Debra Holloway, Debra Holloway 1956-05-20, MRN [id number] PCP:  Kennyth Worth HERO, MD  Electrophysiologist: Dr. Fernande (inactive)    Chief Complaint:  *** annual visit  History of Present Illness: Debra Holloway is a 68 y.o. female with history of  anxiety, OSA (untreated/intolerant),  LBBB, NICM,  ICD  TODAY  *** symptoms *** volume *** meds *** needs new MDs *** CL is reading .   Device information MDT CRT-D implanted August 18, 2015, gen change 01/10/23   Past Medical History:  Diagnosis Date   ADD (attention deficit disorder)    AICD (automatic cardioverter/defibrillator) present August 18, 2015   Anxiety    just when my daddy died a few years ago (August 18, 2015)   AR (allergic rhinitis)    Chicken pox    Chronic combined systolic and diastolic CHF (congestive heart failure) (HCC)    Depression    just when my daddy died a few years ago (Aug 18, 2015)   Dyslipidemia    Environmental allergies    H/O vitamin D  deficiency    LBBB (left bundle branch block)    W/1st degree AV Block, Avoid Beta Blockers   NICM (nonischemic cardiomyopathy) (HCC)    OSA (obstructive sleep apnea) 03/18/2015   Severe OSA with AHI 30/hr; suppose to wear mask but don't (08-18-15)   Seasonal affective disorder     Past Surgical History:  Procedure Laterality Date   BIV ICD GENERATOR CHANGEOUT N/A 01/10/2023   Procedure: BIV ICD GENERATOR CHANGEOUT;  Surgeon: Fernande Elspeth BROCKS, MD;  [facility] INVAS[facility] CV LAB;  Service: Cardiovascular;  Laterality: N/A;   CARDIAC CATHETERIZATION     DILATION AND CURETTAGE OF UTERUS  2003   EP IMPLANTABLE DEVICE N/A 08/18/15   Procedure: BiV ICD Insertion CRT-D;  Surgeon: Elspeth BROCKS Fernande, MD;  Location: [facility] INVASIVE CV LAB;  Service: Cardiovascular;  Laterality: N/A;   LEFT AND RIGHT HEART CATHETERIZATION WITH CORONARY ANGIOGRAM N/A 11/17/2014   Procedure: LEFT AND RIGHT HEART CATHETERIZATION WITH CORONARY  ANGIOGRAM;  Surgeon: Debby DELENA Sor, MD;  Location: [facility] CATH LAB;  Service: Cardiovascular;  Laterality: N/A;   TOTAL ABDOMINAL HYSTERECTOMY  2004   TUBAL LIGATION     WISDOM TOOTH EXTRACTION  1986    Current Outpatient Medications  Medication Sig Dispense Refill   b complex vitamins tablet Take 1 tablet by mouth daily.     buPROPion  (WELLBUTRIN  XL) 300 MG 24 hr tablet Take 1 tablet (300 mg total) by mouth daily. 90 tablet 3   carvedilol  (COREG ) 6.25 MG tablet TAKE ONE TABLET BY MOUTH TWICE DAILY 60 tablet 0   Cholecalciferol  (VITAMIN D  PO) Take 2,000 mg by mouth daily.      fexofenadine (ALLEGRA) 180 MG tablet Take 180 mg by mouth daily.     fluticasone  (FLONASE ) 50 MCG/ACT nasal spray Place 1 spray into both nostrils daily.     furosemide  (LASIX ) 40 MG tablet TAKE ONE TABLET BY MOUTH DAILY 30 tablet 0   lisdexamfetamine (VYVANSE ) 50 MG capsule Take 1 capsule (50 mg total) by mouth daily. 30 capsule 0   montelukast  (SINGULAIR ) 10 MG tablet Take 1 tablet (10 mg total) by mouth at bedtime. 30 tablet 3   potassium chloride  (KLOR-CON ) 10 MEQ tablet Take 1 tablet (10 mEq total) by mouth daily. 90 tablet 3   spironolactone  (ALDACTONE ) 25 MG tablet Take 1 tablet (25 mg total) by mouth daily. 90 tablet 3   No current facility-administered  medications for this visit.    Allergies:   Bee venom, Codeine, Losartan , Erythromycin, Lovastatin , Nickel, Penicillins, and Simvastatin   Social History:  The patient  reports that she has never smoked. She has never used smokeless tobacco. She reports current alcohol use of about 4.0 standard drinks of alcohol per week. She reports that she does not use drugs.   Family History:  The patient's family history includes Alzheimer's disease in her maternal grandfather; Arthritis/Rheumatoid in her maternal grandmother; Breast cancer (age of onset: 47) in her sister; COPD in her mother; Cancer in her paternal uncle; Healthy in her brother and daughter; Heart disease  in her maternal grandmother and paternal grandmother; Hypertension in her paternal grandmother; Lung cancer in her paternal grandfather; Pancreatic cancer (age of onset: 26) in her father.  ROS:  Please see the history of present illness.    All other systems are reviewed and otherwise negative.   PHYSICAL EXAM:  VS:  There were no vitals taken for this visit. BMI: There is no height or weight on file to calculate BMI. Well nourished, well developed, in no acute distress HEENT: normocephalic, atraumatic Neck: no JVD, carotid bruits or masses Cardiac: *** RRR; no significant murmurs, no rubs, or gallops Lungs: ***  CTA b/l, no wheezing, rhonchi or rales Abd: soft, nontender MS: no deformity or atrophy Ext: *** no edema Skin: warm and dry, no rash Neuro:  No gross deficits appreciated Psych: euthymic mood, full affect  *** ICD site is stable, no tethering or discomfort   EKG:  Done today and reviewed by myself shows  ***  Device interrogation done today and reviewed by myself:  *** Battery and lead measurements are good ***   07/27/22: Coronary CTa IMPRESSION: 1. Coronary calcium  score of 0. This was 1st percentile for age-, race-, and sex-matched controls. 2. Normal coronary origin with right dominance. 3. No evidence of CAD.  CAD-RADS 0. 4.  Consider nonischemic causes of fatigue.   07/27/22: TTE 1. Left ventricular ejection fraction, by estimation, is 45 to 50%. The  left ventricle has mildly decreased function. The left ventricle  demonstrates global hypokinesis. Left ventricular diastolic parameters are  consistent with Grade I diastolic  dysfunction (impaired relaxation).   2. Right ventricular systolic function is normal. The right ventricular  size is normal. There is normal pulmonary artery systolic pressure. The  estimated right ventricular systolic pressure is 25.5 mmHg.   3. Left atrial size was mildly dilated.   4. The mitral valve is normal in structure.  No evidence of mitral valve  regurgitation. No evidence of mitral stenosis.   5. The aortic valve is tricuspid. Aortic valve regurgitation is not  visualized. No aortic stenosis is present.   6. The inferior vena cava is normal in size with greater than 50%  respiratory variability, suggesting right atrial pressure of 3 mmHg.    Recent Labs: 09/03/2023: ALT 16; BUN 15; Creatinine, Ser 0.98; Hemoglobin 13.3; Platelets 227.0; Potassium 3.8; Sodium 135; TSH 3.83  09/03/2023: Cholesterol 213; HDL 52.10; LDL Cholesterol 123; Total CHOL/HDL Ratio 4; Triglycerides 188.0; VLDL 37.6   CrCl cannot be calculated (Patient's most recent lab result is older than the maximum 21 days allowed.).   Wt Readings from Last 3 Encounters:  03/17/24 232 lb 3.2 oz (105.3 kg)  09/03/23 235 lb (106.6 kg)  05/03/23 232 lb 9.6 oz (105.5 kg)     Other studies reviewed: Additional studies/records reviewed today include: summarized above  ASSESSMENT AND PLAN:  ICD *** intact function *** no programming changes made  NICM Chronic CHF *** No symptoms or exam findings of volume OL *** OptiVol is good *** On BB/ACE, diuretic tx *** Last echo 45-50% (2023)  4. Significant fatigue. I suspect mostly 2/2 untreated apnea intolerant    Disposition: ***   Current medicines are reviewed at length with the patient today.  The patient did not have any concerns regarding medicines.  Bonney Charlies Arthur, PA-C 06/24/2024 7:20 AM     [facility] [address] [place] [address] [phone] (office)  [phone] (fax)

## 2024-06-25 ENCOUNTER — Ambulatory Visit: Admitting: Physician Assistant

## 2024-06-27 ENCOUNTER — Encounter: Payer: Self-pay | Admitting: Family Medicine

## 2024-06-27 DIAGNOSIS — F988 Other specified behavioral and emotional disorders with onset usually occurring in childhood and adolescence: Secondary | ICD-10-CM

## 2024-06-30 MED ORDER — LISDEXAMFETAMINE DIMESYLATE 50 MG PO CAPS: 50.0000 mg | ORAL_CAPSULE | Freq: Every day | ORAL | 0 refills | Status: DC

## 2024-07-07 NOTE — Progress Notes (Signed)
 Remote ICD Transmission

## 2024-07-07 NOTE — Progress Notes (Unsigned)
  Electrophysiology Office Note:   Date:  07/09/2024  ID:  Debra Holloway, DOB 09-10-1956, MRN [id number]  Primary Cardiologist: Leim Moose, MD Primary Heart Failure: None Electrophysiologist: OLE ONEIDA HOLTS, MD       History of Present Illness:   Debra Holloway is a 68 y.o. female with h/o HFrEF s/p ICD, LBBB, SCAF, HLD, OSA, ADHD, anxiety / depression,  seen today for routine electrophysiology followup.   Since last being seen in our clinic the patient reports doing well overall.  She reports she thought that she heard her device tone the other day while she was vacuuming.  She is not aware of any ICD shocks.  Reports compliance with medications.  She denies chest pain, palpitations, dyspnea, PND, orthopnea, nausea, vomiting, dizziness, syncope, edema, weight gain, or early satiety.   Review of systems complete and found to be negative unless listed in HPI.   EP Information / Studies Reviewed:    EKG is ordered today. Personal review as below.  EKG Interpretation Date/Time:  Wednesday July 09 2024 08:04:59 EDT Ventricular Rate:  76 PR Interval:  114 QRS Duration:  134 QT Interval:  472 QTC Calculation: 531 R Axis:   266  Text Interpretation: Atrial-sensed ventricular-paced rhythm Confirmed by Aniceto Jarvis ([id number]) on 07/09/2024 8:22:07 AM   ICD Interrogation-  reviewed in detail today,  See PACEART report.  Device History: Medtronic BiV ICD implanted 08/16/15 for HFrEF Generator Change > 01/10/23 History of appropriate therapy: No History of AAD therapy: No   Risk Assessment/Calculations:    CHA2DS2-VASc Score = 3   This indicates a 3.2% annual risk of stroke. The patient's score is based upon: CHF History: 1 HTN History: 0 Diabetes History: 0 Stroke History: 0 Vascular Disease History: 0 Age Score: 1 Gender Score: 1             Physical Exam:   VS:  BP 105/69   Pulse 76   Ht 5' 6 (1.676 m)   Wt 229 lb 6.4 oz (104.1 kg)   SpO2 96%   BMI 37.03  kg/m    Wt Readings from Last 3 Encounters:  07/09/24 229 lb 6.4 oz (104.1 kg)  03/17/24 232 lb 3.2 oz (105.3 kg)  09/03/23 235 lb (106.6 kg)     GEN: Well nourished, well developed in no acute distress NECK: No JVD; No carotid bruits CARDIAC: Regular rate and rhythm, no murmurs, rubs, gallops RESPIRATORY:  Clear to auscultation without rales, wheezing or rhonchi  ABDOMEN: Soft, non-tender, non-distended EXTREMITIES:  No edema; No deformity   ASSESSMENT AND PLAN:    Chronic Systolic Dysfunction / NICM s/p Medtronic CRT-D  LBBB  -euvolemic on exam / by device   -97.4% effective BiV pacing -Stable on an appropriate medical regimen -Normal ICD function -See Pace Art report -No changes today -no magnet alerts on device / or evidence that a tone would have gone off   SCAF  CHA2DS2-VASc 3 -Monitor burden on device -not on anticoagulation due to low burden    Disposition:   Follow up with Dr. HOLTS in 12 months   Signed, Jarvis Aniceto, NP-C, AGACNP-BC [facility] - Electrophysiology  07/09/2024, 1:16 PM

## 2024-07-08 DIAGNOSIS — D485 Neoplasm of uncertain behavior of skin: Secondary | ICD-10-CM | POA: Diagnosis not present

## 2024-07-08 DIAGNOSIS — L209 Atopic dermatitis, unspecified: Secondary | ICD-10-CM | POA: Diagnosis not present

## 2024-07-08 DIAGNOSIS — L853 Xerosis cutis: Secondary | ICD-10-CM | POA: Diagnosis not present

## 2024-07-09 ENCOUNTER — Ambulatory Visit: Attending: Cardiology | Admitting: Pulmonary Disease

## 2024-07-09 ENCOUNTER — Encounter: Payer: Self-pay | Admitting: Pulmonary Disease

## 2024-07-09 VITALS — BP 105/69 | HR 76 | Ht 66.0 in | Wt 229.4 lb

## 2024-07-09 DIAGNOSIS — I428 Other cardiomyopathies: Secondary | ICD-10-CM

## 2024-07-09 DIAGNOSIS — I5022 Chronic systolic (congestive) heart failure: Secondary | ICD-10-CM

## 2024-07-09 DIAGNOSIS — I480 Paroxysmal atrial fibrillation: Secondary | ICD-10-CM

## 2024-07-09 DIAGNOSIS — Z95810 Presence of automatic (implantable) cardiac defibrillator: Secondary | ICD-10-CM | POA: Diagnosis not present

## 2024-07-09 DIAGNOSIS — I447 Left bundle-branch block, unspecified: Secondary | ICD-10-CM

## 2024-07-09 LAB — CUP PACEART INCLINIC DEVICE CHECK
Date Time Interrogation Session: 20251008131944
Implantable Lead Connection Status: 753985
Implantable Lead Connection Status: 753985
Implantable Lead Connection Status: 753985
Implantable Lead Implant Date: 20161114
Implantable Lead Implant Date: 20161114
Implantable Lead Implant Date: 20161114
Implantable Lead Location: 753858
Implantable Lead Location: 753859
Implantable Lead Location: 753860
Implantable Lead Model: 4598
Implantable Lead Model: 5076
Implantable Pulse Generator Implant Date: 20240410

## 2024-07-09 NOTE — Patient Instructions (Signed)
 Medication Instructions:   Your physician recommends that you continue on your current medications as directed. Please refer to the Current Medication list given to you today.   *If you need a refill on your cardiac medications before your next appointment, please call your pharmacy*  Lab Work:  NONE ORDERED  TODAY   If you have labs (blood work) drawn today and your tests are completely normal, you will receive your results only by: MyChart Message (if you have MyChart) OR A paper copy in the mail If you have any lab test that is abnormal or we need to change your treatment, we will call you to review the results.  Testing/Procedures: NONE ORDERED  TODAY    Follow-Up: At [facility], you and your health needs are our priority.  As part of our continuing mission to provide you with exceptional heart care, our providers are all part of one team.  This team includes your primary Cardiologist (physician) and Advanced Practice Providers or APPs (Physician Assistants and Nurse Practitioners) who all work together to provide you with the care you need, when you need it.  Your next appointment:   1 year(s)  Provider:   You may see OLE ONEIDA HOLTS, MD or one of the following Advanced Practice Providers on your designated Care Team:   Daphne Barrack, NP    We recommend signing up for the patient portal called MyChart.  Sign up information is provided on this After Visit Summary.  MyChart is used to connect with patients for Virtual Visits (Telemedicine).  Patients are able to view lab/test results, encounter notes, upcoming appointments, etc.  Non-urgent messages can be sent to your provider as well.   To learn more about what you can do with MyChart, go to [email or web address].   Other Instructions

## 2024-07-14 ENCOUNTER — Ambulatory Visit: Payer: Self-pay | Admitting: Cardiology

## 2024-07-18 DIAGNOSIS — C44529 Squamous cell carcinoma of skin of other part of trunk: Secondary | ICD-10-CM | POA: Diagnosis not present

## 2024-07-21 ENCOUNTER — Ambulatory Visit

## 2024-07-21 DIAGNOSIS — I5022 Chronic systolic (congestive) heart failure: Secondary | ICD-10-CM | POA: Diagnosis not present

## 2024-07-22 LAB — CUP PACEART REMOTE DEVICE CHECK
Battery Remaining Longevity: 96 mo
Battery Voltage: 2.99 V
Brady Statistic AP VP Percent: 0.14 %
Brady Statistic AP VS Percent: 0.02 %
Brady Statistic AS VP Percent: 98.75 %
Brady Statistic AS VS Percent: 1.09 %
Brady Statistic RA Percent Paced: 0.16 %
Brady Statistic RV Percent Paced: 98.89 %
Date Time Interrogation Session: 20251019210251
HighPow Impedance: 87 Ohm
Implantable Lead Connection Status: 753985
Implantable Lead Connection Status: 753985
Implantable Lead Connection Status: 753985
Implantable Lead Implant Date: 20161114
Implantable Lead Implant Date: 20161114
Implantable Lead Implant Date: 20161114
Implantable Lead Location: 753858
Implantable Lead Location: 753859
Implantable Lead Location: 753860
Implantable Lead Model: 4598
Implantable Lead Model: 5076
Implantable Pulse Generator Implant Date: 20240410
Lead Channel Impedance Value: 1102 Ohm
Lead Channel Impedance Value: 1121 Ohm
Lead Channel Impedance Value: 1216 Ohm
Lead Channel Impedance Value: 399 Ohm
Lead Channel Impedance Value: 418 Ohm
Lead Channel Impedance Value: 456 Ohm
Lead Channel Impedance Value: 456 Ohm
Lead Channel Impedance Value: 475 Ohm
Lead Channel Impedance Value: 532 Ohm
Lead Channel Impedance Value: 646 Ohm
Lead Channel Impedance Value: 722 Ohm
Lead Channel Impedance Value: 893 Ohm
Lead Channel Impedance Value: 950 Ohm
Lead Channel Pacing Threshold Amplitude: 0.625 V
Lead Channel Pacing Threshold Amplitude: 0.625 V
Lead Channel Pacing Threshold Amplitude: 1 V
Lead Channel Pacing Threshold Pulse Width: 0.4 ms
Lead Channel Pacing Threshold Pulse Width: 0.4 ms
Lead Channel Pacing Threshold Pulse Width: 0.7 ms
Lead Channel Sensing Intrinsic Amplitude: 2.6 mV
Lead Channel Sensing Intrinsic Amplitude: 23.4 mV
Lead Channel Setting Pacing Amplitude: 1.5 V
Lead Channel Setting Pacing Amplitude: 1.5 V
Lead Channel Setting Pacing Amplitude: 2 V
Lead Channel Setting Pacing Pulse Width: 0.4 ms
Lead Channel Setting Pacing Pulse Width: 0.7 ms
Lead Channel Setting Sensing Sensitivity: 0.3 mV
Zone Setting Status: 755011
Zone Setting Status: 755011

## 2024-07-25 NOTE — Progress Notes (Signed)
 Remote ICD Transmission

## 2024-07-28 ENCOUNTER — Ambulatory Visit: Payer: Self-pay | Admitting: Cardiology

## 2024-08-02 ENCOUNTER — Encounter: Payer: Self-pay | Admitting: Family Medicine

## 2024-08-02 DIAGNOSIS — F988 Other specified behavioral and emotional disorders with onset usually occurring in childhood and adolescence: Secondary | ICD-10-CM

## 2024-08-05 MED ORDER — LISDEXAMFETAMINE DIMESYLATE 50 MG PO CAPS: 50.0000 mg | ORAL_CAPSULE | Freq: Every day | ORAL | 0 refills | Status: DC

## 2024-09-07 ENCOUNTER — Encounter: Payer: Self-pay | Admitting: Family Medicine

## 2024-09-07 DIAGNOSIS — F988 Other specified behavioral and emotional disorders with onset usually occurring in childhood and adolescence: Secondary | ICD-10-CM

## 2024-09-08 MED ORDER — LISDEXAMFETAMINE DIMESYLATE 50 MG PO CAPS: 50.0000 mg | ORAL_CAPSULE | Freq: Every day | ORAL | 0 refills | Status: AC

## 2024-09-08 NOTE — Telephone Encounter (Signed)
 Patient notified via MyChart of prescription approval.

## 2024-09-19 ENCOUNTER — Telehealth: Payer: Self-pay

## 2024-09-19 ENCOUNTER — Ambulatory Visit (INDEPENDENT_AMBULATORY_CARE_PROVIDER_SITE_OTHER): Admitting: Family Medicine

## 2024-09-19 ENCOUNTER — Encounter: Payer: Self-pay | Admitting: Family Medicine

## 2024-09-19 VITALS — BP 120/80 | HR 89 | Temp 97.0°F | Ht 66.0 in | Wt 232.0 lb

## 2024-09-19 DIAGNOSIS — E785 Hyperlipidemia, unspecified: Secondary | ICD-10-CM

## 2024-09-19 DIAGNOSIS — Z0001 Encounter for general adult medical examination with abnormal findings: Secondary | ICD-10-CM

## 2024-09-19 DIAGNOSIS — Z9109 Other allergy status, other than to drugs and biological substances: Secondary | ICD-10-CM

## 2024-09-19 DIAGNOSIS — R739 Hyperglycemia, unspecified: Secondary | ICD-10-CM

## 2024-09-19 DIAGNOSIS — F988 Other specified behavioral and emotional disorders with onset usually occurring in childhood and adolescence: Secondary | ICD-10-CM

## 2024-09-19 DIAGNOSIS — F909 Attention-deficit hyperactivity disorder, unspecified type: Secondary | ICD-10-CM | POA: Diagnosis not present

## 2024-09-19 DIAGNOSIS — E2839 Other primary ovarian failure: Secondary | ICD-10-CM

## 2024-09-19 DIAGNOSIS — R7989 Other specified abnormal findings of blood chemistry: Secondary | ICD-10-CM | POA: Diagnosis not present

## 2024-09-19 DIAGNOSIS — F339 Major depressive disorder, recurrent, unspecified: Secondary | ICD-10-CM

## 2024-09-19 LAB — CBC
HCT: 39.2 % (ref 36.0–46.0)
Hemoglobin: 13.1 g/dL (ref 12.0–15.0)
MCHC: 33.3 g/dL (ref 30.0–36.0)
MCV: 89.7 fl (ref 78.0–100.0)
Platelets: 247 K/uL (ref 150.0–400.0)
RBC: 4.37 Mil/uL (ref 3.87–5.11)
RDW: 13.9 % (ref 11.5–15.5)
WBC: 7.5 K/uL (ref 4.0–10.5)

## 2024-09-19 LAB — COMPREHENSIVE METABOLIC PANEL WITH GFR
ALT: 15 U/L (ref 3–35)
AST: 17 U/L (ref 5–37)
Albumin: 4.2 g/dL (ref 3.5–5.2)
Alkaline Phosphatase: 67 U/L (ref 39–117)
BUN: 17 mg/dL (ref 6–23)
CO2: 26 meq/L (ref 19–32)
Calcium: 9.4 mg/dL (ref 8.4–10.5)
Chloride: 104 meq/L (ref 96–112)
Creatinine, Ser: 0.91 mg/dL (ref 0.40–1.20)
GFR: 65.53 mL/min
Glucose, Bld: 104 mg/dL — ABNORMAL HIGH (ref 70–99)
Potassium: 3.7 meq/L (ref 3.5–5.1)
Sodium: 139 meq/L (ref 135–145)
Total Bilirubin: 0.5 mg/dL (ref 0.2–1.2)
Total Protein: 7.6 g/dL (ref 6.0–8.3)

## 2024-09-19 LAB — LIPID PANEL
Cholesterol: 229 mg/dL — ABNORMAL HIGH (ref 28–200)
HDL: 60.7 mg/dL
LDL Cholesterol: 148 mg/dL — ABNORMAL HIGH (ref 10–99)
NonHDL: 167.97
Total CHOL/HDL Ratio: 4
Triglycerides: 99 mg/dL (ref 10.0–149.0)
VLDL: 19.8 mg/dL (ref 0.0–40.0)

## 2024-09-19 LAB — HEMOGLOBIN A1C: Hgb A1c MFr Bld: 5.7 % (ref 4.6–6.5)

## 2024-09-19 LAB — TSH: TSH: 4.43 u[IU]/mL (ref 0.35–5.50)

## 2024-09-19 LAB — VITAMIN D 25 HYDROXY (VIT D DEFICIENCY, FRACTURES): VITD: 16.05 ng/mL — ABNORMAL LOW (ref 30.00–100.00)

## 2024-09-19 MED ORDER — VYVANSE 50 MG PO CAPS: 50.0000 mg | ORAL_CAPSULE | Freq: Every day | ORAL | 0 refills | Status: DC

## 2024-09-19 NOTE — Telephone Encounter (Unsigned)
 Copied from CRM [id number]. Topic: Clinical - Medication Question >> Sep 19, 2024 11:14 AM Franky GRADE wrote: Reason for CRM: Karleen from [facility] is calling because they received a prescription for VYVANSE  50 MG capsule [[id number]]; however, they do not carry the medication and would like to know if it would be okay to fill the generic brand.

## 2024-09-19 NOTE — Assessment & Plan Note (Signed)
 Stable on Wellbutrin 300 mg daily.

## 2024-09-19 NOTE — Assessment & Plan Note (Signed)
 Symptoms overall stable.  We did start Singulair  recently for insect bites which was beneficial however she had to discontinue due to persistent cough.  Symptoms are currently manageable on Allegra and Flonase .

## 2024-09-19 NOTE — Assessment & Plan Note (Signed)
 Check lipids with labs.  She is not currently on any medications for this.

## 2024-09-19 NOTE — Patient Instructions (Signed)
 It was very nice to see you today!  VISIT SUMMARY: Today, we addressed your concerns about your current medication, reviewed your history of skin cancer, and discussed your general health maintenance. We also talked about your dizziness, tinnitus, and exercise routine.  YOUR PLAN: ATTENTION-DEFICIT HYPERACTIVITY DISORDER: You are experiencing issues with the generic Vyvanse , which is making you feel nervous and not as effective as the brand name. -We have prescribed the brand name Vyvanse  and started the prior authorization process with your insurance. -We documented the adverse effects of the generic Vyvanse  for the insurance appeal.  MAJOR DEPRESSIVE DISORDER, RECURRENT: Your depression is being worsened by the ineffective ADHD medication, affecting your motivation and productivity. -Addressing the ADHD medication should help improve your depressive symptoms.  ENVIRONMENTAL ALLERGIES: Your allergies are being managed with Flonase , Astelin, and Allegra. You also use a salve for itching. -Continue using Flonase , Astelin, and Allegra as prescribed. -Use the salve from your dermatologist for itching.  HISTORY OF SKIN CANCER WITH KELOID FORMATION: You have a keloid at a previous skin cancer site that is being managed with a salve. -Continue using the salve for itching. -Follow up with your dermatologist in January.  IMPLANTED CARDIAC DEFIBRILLATOR: You have an ICD, which could interfere with certain procedures. -Inform all healthcare providers about your ICD to avoid complications during procedures.  GENERAL HEALTH MAINTENANCE: We discussed your bone density scan and blood work. You declined a colonoscopy due to anxiety and personal preference. -We have ordered a bone density scan and blood work. -We respect your decision to decline the colonoscopy.  Return in about 6 months (around 03/20/2025) for Follow Up.   Take care, Dr Kennyth  PLEASE NOTE:  If you had any lab tests, please let us   know if you have not heard back within a few days. You may see your results on mychart before we have a chance to review them but we will give you a call once they are reviewed by us .   If we ordered any referrals today, please let us  know if you have not heard from their office within the next week.   If you had any urgent prescriptions sent in today, please check with the pharmacy within an hour of our visit to make sure the prescription was transmitted appropriately.   Please try these tips to maintain a healthy lifestyle:  Eat at least 3 REAL meals and 1-2 snacks per day.  Aim for no more than 5 hours between eating.  If you eat breakfast, please do so within one hour of getting up.   Each meal should contain half fruits/vegetables, one quarter protein, and one quarter carbs (no bigger than a computer mouse)  Cut down on sweet beverages. This includes juice, soda, and sweet tea.   Drink at least 1 glass of water with each meal and aim for at least 8 glasses per day  Exercise at least 150 minutes every week.     Preventive Care 59 Years and Older, Female Preventive care refers to lifestyle choices and visits with your health care provider that can promote health and wellness. Preventive care visits are also called wellness exams. What can I expect for my preventive care visit? Counseling Your health care provider may ask you questions about your: Medical history, including: Past medical problems. Family medical history. Pregnancy and menstrual history. History of falls. Current health, including: Memory and ability to understand (cognition). Emotional well-being. Home life and relationship well-being. Sexual activity and sexual health. Lifestyle,  including: Alcohol, nicotine or tobacco, and drug use. Access to firearms. Diet, exercise, and sleep habits. Work and work astronomer. Sunscreen use. Safety issues such as seatbelt and bike helmet use. Physical exam Your health  care provider will check your: Height and weight. These may be used to calculate your BMI (body mass index). BMI is a measurement that tells if you are at a healthy weight. Waist circumference. This measures the distance around your waistline. This measurement also tells if you are at a healthy weight and may help predict your risk of certain diseases, such as type 2 diabetes and high blood pressure. Heart rate and blood pressure. Body temperature. Skin for abnormal spots. What immunizations do I need?  Vaccines are usually given at various ages, according to a schedule. Your health care provider will recommend vaccines for you based on your age, medical history, and lifestyle or other factors, such as travel or where you work. What tests do I need? Screening Your health care provider may recommend screening tests for certain conditions. This may include: Lipid and cholesterol levels. Hepatitis C test. Hepatitis B test. HIV (human immunodeficiency virus) test. STI (sexually transmitted infection) testing, if you are at risk. Lung cancer screening. Colorectal cancer screening. Diabetes screening. This is done by checking your blood sugar (glucose) after you have not eaten for a while (fasting). Mammogram. Talk with your health care provider about how often you should have regular mammograms. BRCA-related cancer screening. This may be done if you have a family history of breast, ovarian, tubal, or peritoneal cancers. Bone density scan. This is done to screen for osteoporosis. Talk with your health care provider about your test results, treatment options, and if necessary, the need for more tests. Follow these instructions at home: Eating and drinking  Eat a diet that includes fresh fruits and vegetables, whole grains, lean protein, and low-fat dairy products. Limit your intake of foods with high amounts of sugar, saturated fats, and salt. Take vitamin and mineral supplements as recommended  by your health care provider. Do not drink alcohol if your health care provider tells you not to drink. If you drink alcohol: Limit how much you have to 0-1 drink a day. Know how much alcohol is in your drink. In the [place] one drink equals one 12 oz bottle of beer (355 mL), one 5 oz glass of wine (148 mL), or one 1 oz glass of hard liquor (44 mL). Lifestyle Brush your teeth every morning and night with fluoride toothpaste. Floss one time each day. Exercise for at least 30 minutes 5 or more days each week. Do not use any products that contain nicotine or tobacco. These products include cigarettes, chewing tobacco, and vaping devices, such as e-cigarettes. If you need help quitting, ask your health care provider. Do not use drugs. If you are sexually active, practice safe sex. Use a condom or other form of protection in order to prevent STIs. Take aspirin  only as told by your health care provider. Make sure that you understand how much to take and what form to take. Work with your health care provider to find out whether it is safe and beneficial for you to take aspirin  daily. Ask your health care provider if you need to take a cholesterol-lowering medicine (statin). Find healthy ways to manage stress, such as: Meditation, yoga, or listening to music. Journaling. Talking to a trusted person. Spending time with friends and family. Minimize exposure to UV radiation to reduce your risk of skin cancer.  Safety Always wear your seat belt while driving or riding in a vehicle. Do not drive: If you have been drinking alcohol. Do not ride with someone who has been drinking. When you are tired or distracted. While texting. If you have been using any mind-altering substances or drugs. Wear a helmet and other protective equipment during sports activities. If you have firearms in your house, make sure you follow all gun safety procedures. What's next? Visit your health care provider once a year for an  annual wellness visit. Ask your health care provider how often you should have your eyes and teeth checked. Stay up to date on all vaccines. This information is not intended to replace advice given to you by your health care provider. Make sure you discuss any questions you have with your health care provider. Document Revised: 03/16/2021 Document Reviewed: 03/16/2021 Elsevier Patient Education  2024 [employer].

## 2024-09-19 NOTE — Assessment & Plan Note (Signed)
 She is on Vyvanse  50 mg daily.  She has been on generic for several months but has having some issues with this compared to the brand-name formulation.  Feels like the generic formulation is worsening her anxiety and depression symptoms and also does not feel like it is adequately managing her symptoms.  She would like to go back to the brand-name possible.  Will send a new prescription in for dispense as written though she is aware insurance may not pay for this.

## 2024-09-19 NOTE — Assessment & Plan Note (Signed)
Check vitamin D with labs.

## 2024-09-19 NOTE — Assessment & Plan Note (Signed)
 BMI 37.45 with comorbidities.  Discussed lifestyle modifications.  Insurance would not pay for GLP-1 previously.

## 2024-09-19 NOTE — Progress Notes (Signed)
 "  Chief Complaint:  Debra Holloway is a 68 y.o. female who presents today for her annual comprehensive physical exam.    Assessment/Plan:  Chronic Problems Addressed Today: ADHD She is on Vyvanse  50 mg daily.  She has been on generic for several months but has having some issues with this compared to the brand-name formulation.  Feels like the generic formulation is worsening her anxiety and depression symptoms and also does not feel like it is adequately managing her symptoms.  She would like to go back to the brand-name possible.  Will send a new prescription in for dispense as written though she is aware insurance may not pay for this.  Environmental allergies Symptoms overall stable.  We did start Singulair  recently for insect bites which was beneficial however she had to discontinue due to persistent cough.  Symptoms are currently manageable on Allegra and Flonase .  Dyslipidemia Check lipids with labs.  She is not currently on any medications for this.  Recurrent depression Stable on Wellbutrin  300 mg daily.  Low vitamin D  level Check vitamin D  with labs.   Morbid obesity (HCC) BMI 37.45 with comorbidities.  Discussed lifestyle modifications.  Insurance would not pay for GLP-1 previously.   Preventative Healthcare: Check labs.  Will order bone density scan.  Declined vaccines.  Declined colon cancer screening  Patient Counseling(The following topics were reviewed and/or handout was given):  -Nutrition: Stressed importance of moderation in sodium/caffeine intake, saturated fat and cholesterol, caloric balance, sufficient intake of fresh fruits, vegetables, and fiber.  -Stressed the importance of regular exercise.   -Substance Abuse: Discussed cessation/primary prevention of tobacco, alcohol, or other drug use; driving or other dangerous activities under the influence; availability of treatment for abuse.   -Injury prevention: Discussed safety belts, safety helmets, smoke  detector, smoking near bedding or upholstery.   -Sexuality: Discussed sexually transmitted diseases, partner selection, use of condoms, avoidance of unintended pregnancy and contraceptive alternatives.   -Dental health: Discussed importance of regular tooth brushing, flossing, and dental visits.  -Health maintenance and immunizations reviewed. Please refer to Health maintenance section.  Return to care in 1 year for next preventative visit.     Subjective:  HPI:  She has no acute complaints today. Patient is here today for her annual physical.  See assessment / plan for status of chronic conditions.  Discussed the use of AI scribe software for clinical note transcription with the patient, who gave verbal consent to proceed.  History of Present Illness Debra Holloway is a 68 year old female who presents for an annual physical exam and concerns about her medication.  She is experiencing issues with her current generic Vyvanse  medication, describing it as making her feel 'funky' and 'nervous'. The generic version is not as effective as the brand name, which she previously used without issues. The medication change occurred earlier this year when her insurance stopped covering the brand name. She has a history of anxiety and heart issues and reports that the current medication makes her feel more nervous and gives her a sensation of elevated blood pressure. She also has an ICD and a history of heart failure, making her cautious about medications that could affect her heart.  She previously used Singulair  for itching related to a bone condition but discontinued it due to a side effect of a dry, hacky cough. Initially, she feared the cough was related to worsening heart failure but identified it as a side effect of Singulair  after discontinuing the medication.  She has a history of skin cancer and is currently monitoring a site that was previously biopsied and treated. She describes the area as  having 'bubbled up' and notes it itches. She is scheduled to see her dermatologist in January for further evaluation.  She experiences dizziness upon standing, which she attributes to her medications. She also reports tinnitus, describing it as ringing in her ears that starts and stops unpredictably.  Her exercise routine has been affected by a friend's injury, but she continues to exercise at least once a week, primarily walking, which she finds calming. She has lost some weight but notes challenges with maintaining her routine due to seasonal changes.      09/19/2024    7:53 AM  Depression screen PHQ 2/9  Decreased Interest 0  Down, Depressed, Hopeless 0  PHQ - 2 Score 0    Health Maintenance Due  Topic Date Due   Bone Density Scan  Never done   Colonoscopy  Never done   Medicare Annual Wellness (AWV)  10/09/2024     ROS: Per HPI, otherwise a complete review of systems was negative.   PMH:  The following were reviewed and entered/updated in epic: Past Medical History:  Diagnosis Date   ADD (attention deficit disorder)    AICD (automatic cardioverter/defibrillator) present 09-08-2015   Anxiety    just when my daddy died a few years ago (08-Sep-2015)   AR (allergic rhinitis)    Chicken pox    Chronic combined systolic and diastolic CHF (congestive heart failure) (HCC)    Depression    just when my daddy died a few years ago (09/08/15)   Dyslipidemia    Environmental allergies    H/O vitamin D  deficiency    LBBB (left bundle branch block)    W/1st degree AV Block, Avoid Beta Blockers   NICM (nonischemic cardiomyopathy) (HCC)    OSA (obstructive sleep apnea) 03/18/2015   Severe OSA with AHI 30/hr; suppose to wear mask but don't (Sep 08, 2015)   Seasonal affective disorder    Patient Active Problem List   Diagnosis Date Noted   Morbid obesity (HCC) 09/03/2023   Insomnia 07/11/2022   Low vitamin D  level 07/11/2022   CHF (congestive heart failure) (HCC) 07/10/2022    Overactive bladder 05/30/2022   PAF (paroxysmal atrial fibrillation) (HCC) SCAF 07/06/2021   ICD (implantable cardioverter-defibrillator) in place 04/19/2020   Recurrent depression 09/21/2016   OSA (obstructive sleep apnea) 03/18/2015   Chronic systolic heart failure (HCC) 12/07/2014   Nonischemic dilated cardiomyopathy (HCC)    LBBB (left bundle branch block)    Dyslipidemia    Hot flashes, menopausal 09/18/2014   ADHD 09/06/2013   Environmental allergies 09/06/2013   Past Surgical History:  Procedure Laterality Date   BIV ICD GENERATOR CHANGEOUT N/A 01/10/2023   Procedure: BIV ICD GENERATOR CHANGEOUT;  Surgeon: Fernande Elspeth BROCKS, MD;  [facility]VASIVE CV LAB;  Service: Cardiovascular;  Laterality: N/A;   CARDIAC CATHETERIZATION     DILATION AND CURETTAGE OF UTERUS  2003   EP IMPLANTABLE DEVICE N/A 09/08/2015   Procedure: BiV ICD Insertion CRT-D;  Surgeon: Elspeth BROCKS Fernande, MD;  Location: [facility] INVASIVE CV LAB;  Service: Cardiovascular;  Laterality: N/A;   LEFT AND RIGHT HEART CATHETERIZATION WITH CORONARY ANGIOGRAM N/A 11/17/2014   Procedure: LEFT AND RIGHT HEART CATHETERIZATION WITH CORONARY ANGIOGRAM;  Surgeon: Debby DELENA Sor, MD;  Location: [facility] CATH LAB;  Service: Cardiovascular;  Laterality: N/A;   TOTAL ABDOMINAL HYSTERECTOMY  2004   TUBAL  LIGATION     WISDOM TOOTH EXTRACTION  1986    Family History  Problem Relation Age of Onset   COPD Mother        Living   Pancreatic cancer Father 69       Deceased   Lung cancer Paternal Grandfather    Heart disease Paternal Grandmother    Hypertension Paternal Grandmother    Alzheimer's disease Maternal Grandfather    Arthritis/Rheumatoid Maternal Grandmother    Heart disease Maternal Grandmother        Tachycardia   Cancer Paternal Uncle    Healthy Brother        x3   Healthy Daughter        x3   Breast cancer Sister 25    Medications- reviewed and updated Current Outpatient Medications  Medication Sig Dispense Refill   b  complex vitamins tablet Take 1 tablet by mouth daily.     buPROPion  (WELLBUTRIN  XL) 300 MG 24 hr tablet Take 1 tablet (300 mg total) by mouth daily. 90 tablet 3   carvedilol  (COREG ) 6.25 MG tablet TAKE ONE TABLET BY MOUTH TWICE DAILY 60 tablet 0   Cholecalciferol  (VITAMIN D  PO) Take 2,000 mg by mouth daily.      clobetasol cream (TEMOVATE) 0.05 % Apply topically 2 (two) times daily as needed.     fexofenadine (ALLEGRA) 180 MG tablet Take 180 mg by mouth daily.     fluticasone  (FLONASE ) 50 MCG/ACT nasal spray Place 1 spray into both nostrils daily.     furosemide  (LASIX ) 40 MG tablet TAKE ONE TABLET BY MOUTH DAILY 30 tablet 0   lisdexamfetamine  (VYVANSE ) 50 MG capsule Take 1 capsule (50 mg total) by mouth daily. 30 capsule 0   potassium chloride  (KLOR-CON ) 10 MEQ tablet Take 1 tablet (10 mEq total) by mouth daily. 90 tablet 3   spironolactone  (ALDACTONE ) 25 MG tablet Take 1 tablet (25 mg total) by mouth daily. 90 tablet 3   VYVANSE  50 MG capsule Take 1 capsule (50 mg total) by mouth daily. Dispense brand name only 30 capsule 0   No current facility-administered medications for this visit.    Allergies-reviewed and updated Allergies[1]  Social History   Socioeconomic History   Marital status: Divorced    Spouse name: Not on file   Number of children: Not on file   Years of education: Not on file   Highest education level: Some college, no degree  Occupational History   Not on file  Tobacco Use   Smoking status: Never   Smokeless tobacco: Never  Vaping Use   Vaping status: Never Used  Substance and Sexual Activity   Alcohol use: Yes    Alcohol/week: 4.0 standard drinks of alcohol    Types: 4 Glasses of wine per week   Drug use: No   Sexual activity: Not Currently  Other Topics Concern   Not on file  Social History Narrative   Not on file   Social Drivers of Health   Tobacco Use: Low Risk (09/19/2024)   Patient History    Smoking Tobacco Use: Never    Smokeless Tobacco  Use: Never    Passive Exposure: Not on file  Financial Resource Strain: Low Risk (09/16/2024)   Overall Financial Resource Strain (CARDIA)    Difficulty of Paying Living Expenses: Not hard at all  Food Insecurity: No Food Insecurity (09/16/2024)   Epic    Worried About Radiation Protection Practitioner of Food in the Last Year: Never true  Ran Out of Food in the Last Year: Never true  Transportation Needs: No Transportation Needs (09/16/2024)   Epic    Lack of Transportation (Medical): No    Lack of Transportation (Non-Medical): No  Physical Activity: Insufficiently Active (09/16/2024)   Exercise Vital Sign    Days of Exercise per Week: 3 days    Minutes of Exercise per Session: 30 min  Stress: Stress Concern Present (09/16/2024)   [employer] of Occupational Health - Occupational Stress Questionnaire    Feeling of Stress: Very much  Social Connections: Moderately Isolated (09/16/2024)   Social Connection and Isolation Panel    Frequency of Communication with Friends and Family: More than three times a week    Frequency of Social Gatherings with Friends and Family: Three times a week    Attends Religious Services: 1 to 4 times per year    Active Member of Clubs or Organizations: No    Attends Banker Meetings: Not on file    Marital Status: Divorced  Depression (PHQ2-9): Low Risk (09/19/2024)   Depression (PHQ2-9)    PHQ-2 Score: 0  Alcohol Screen: Low Risk (09/16/2024)   Alcohol Screen    Last Alcohol Screening Score (AUDIT): 1  Housing: Low Risk (09/16/2024)   Epic    Unable to Pay for Housing in the Last Year: No    Number of Times Moved in the Last Year: 1    Homeless in the Last Year: No  Utilities: Not At Risk (10/10/2023)   AHC Utilities    Threatened with loss of utilities: No  Health Literacy: Adequate Health Literacy (10/10/2023)   B1300 Health Literacy    Frequency of need for help with medical instructions: Never        Objective:  Physical Exam: BP 120/80    Pulse 89   Temp (!) 97 F (36.1 C) (Temporal)   Ht 5' 6 (1.676 m)   Wt 232 lb (105.2 kg)   SpO2 94%   BMI 37.45 kg/m   Body mass index is 37.45 kg/m. Wt Readings from Last 3 Encounters:  09/19/24 232 lb (105.2 kg)  07/09/24 229 lb 6.4 oz (104.1 kg)  03/17/24 232 lb 3.2 oz (105.3 kg)   Gen: NAD, resting comfortably HEENT: TMs normal bilaterally. OP clear. No thyromegaly noted.  CV: RRR with no murmurs appreciated Pulm: NWOB, CTAB with no crackles, wheezes, or rhonchi GI: Normal bowel sounds present. Soft, Nontender, Nondistended. MSK: no edema, cyanosis, or clubbing noted Skin: warm, dry Neuro: CN2-12 grossly intact. Strength 5/5 in upper and lower extremities. Reflexes symmetric and intact bilaterally.  Psych: Normal affect and thought content     Malyk Girouard M. Kennyth, MD 09/19/2024 8:26 AM     [1]  Allergies Allergen Reactions   Bee Venom Anaphylaxis   Codeine Nausea And Vomiting   Losartan  Other (See Comments)    Pt reports cough and insomnia.   Erythromycin Itching and Rash   Lovastatin  Other (See Comments)    weakness   Nickel Rash   Penicillins Hives, Itching and Rash   Simvastatin Other (See Comments)    weakness   "

## 2024-09-19 NOTE — Progress Notes (Signed)
 Her cholesterol is elevated.  I know we have discussed this in the past however we should try to get her LDL less than 100.  She has had issues with statins in the past.  We can try a alternative medication such as Zetia  10 mg daily or we can refer her to the lipid clinic.  Regardless she should also continue to work on diet and exercise and I would like to have this rechecked in 3 to 6 months.  Please send in medication or referral based on patient preference.  Vitamin D  is low.  Recommend she start 50,000 IUs weekly.  We should recheck this in 3 to 6 months.  Her A1c is borderline but better than last year.  We can recheck this in a year.  All of her other labs are at goal and we can recheck everything in a year.

## 2024-09-22 MED ORDER — VITAMIN D (ERGOCALCIFEROL) 1.25 MG (50000 UNIT) PO CAPS: 50000.0000 [IU] | ORAL_CAPSULE | ORAL | 0 refills | Status: AC

## 2024-09-22 NOTE — Telephone Encounter (Signed)
 Ok to fill generic version.

## 2024-09-22 NOTE — Telephone Encounter (Signed)
 Please see msg from pharmacy and advise

## 2024-09-23 NOTE — Telephone Encounter (Signed)
" °  Called [facility], notified ok genetic Vyvanse  per PCP  "

## 2024-10-20 ENCOUNTER — Ambulatory Visit

## 2024-10-20 DIAGNOSIS — I5022 Chronic systolic (congestive) heart failure: Secondary | ICD-10-CM

## 2024-10-22 LAB — CUP PACEART REMOTE DEVICE CHECK
Battery Remaining Longevity: 88 mo
Battery Voltage: 2.99 V
Brady Statistic RV Percent Paced: 96.36 %
Date Time Interrogation Session: 20260118211713
HighPow Impedance: 70 Ohm
Implantable Lead Connection Status: 753985
Implantable Lead Connection Status: 753985
Implantable Lead Connection Status: 753985
Implantable Lead Implant Date: 20161114
Implantable Lead Implant Date: 20161114
Implantable Lead Implant Date: 20161114
Implantable Lead Location: 753858
Implantable Lead Location: 753859
Implantable Lead Location: 753860
Implantable Lead Model: 4598
Implantable Lead Model: 5076
Implantable Pulse Generator Implant Date: 20240410
Lead Channel Impedance Value: 1083 Ohm
Lead Channel Impedance Value: 1083 Ohm
Lead Channel Impedance Value: 1197 Ohm
Lead Channel Impedance Value: 361 Ohm
Lead Channel Impedance Value: 399 Ohm
Lead Channel Impedance Value: 418 Ohm
Lead Channel Impedance Value: 418 Ohm
Lead Channel Impedance Value: 456 Ohm
Lead Channel Impedance Value: 551 Ohm
Lead Channel Impedance Value: 589 Ohm
Lead Channel Impedance Value: 722 Ohm
Lead Channel Impedance Value: 893 Ohm
Lead Channel Impedance Value: 912 Ohm
Lead Channel Pacing Threshold Amplitude: 0.625 V
Lead Channel Pacing Threshold Amplitude: 0.75 V
Lead Channel Pacing Threshold Amplitude: 0.875 V
Lead Channel Pacing Threshold Pulse Width: 0.4 ms
Lead Channel Pacing Threshold Pulse Width: 0.4 ms
Lead Channel Pacing Threshold Pulse Width: 0.7 ms
Lead Channel Sensing Intrinsic Amplitude: 1.9 mV
Lead Channel Sensing Intrinsic Amplitude: 29 mV
Lead Channel Setting Pacing Amplitude: 1.5 V
Lead Channel Setting Pacing Amplitude: 1.5 V
Lead Channel Setting Pacing Amplitude: 2 V
Lead Channel Setting Pacing Pulse Width: 0.4 ms
Lead Channel Setting Pacing Pulse Width: 0.7 ms
Lead Channel Setting Sensing Sensitivity: 0.3 mV
Zone Setting Status: 755011
Zone Setting Status: 755011

## 2024-10-23 ENCOUNTER — Ambulatory Visit: Payer: Self-pay | Admitting: Cardiology

## 2024-10-24 NOTE — Progress Notes (Signed)
 Remote ICD Transmission

## 2024-11-05 ENCOUNTER — Encounter: Payer: Self-pay | Admitting: Family Medicine

## 2024-11-06 ENCOUNTER — Telehealth: Payer: Self-pay | Admitting: *Deleted

## 2024-11-06 MED ORDER — VYVANSE 50 MG PO CAPS: 50.0000 mg | ORAL_CAPSULE | Freq: Every day | ORAL | 0 refills | Status: AC

## 2024-11-06 NOTE — Telephone Encounter (Signed)
 Copied from CRM [id number]. Topic: Clinical - Prescription Issue >> Nov 06, 2024  1:38 PM Montie POUR wrote: Reason for CRM:  Karleen is calling from pharmacy to see if it is okay to fill order for VYVANSE  50 MG capsule with the generic brand. Please call him at [phone]   Please advise  [facility]

## 2024-11-07 NOTE — Telephone Encounter (Signed)
 Called  [phone] let them know ok per PCP to dispense generic brand

## 2024-11-07 NOTE — Telephone Encounter (Signed)
 That is ok.

## 2025-01-19 ENCOUNTER — Encounter

## 2025-03-20 ENCOUNTER — Ambulatory Visit: Admitting: Family Medicine

## 2025-09-22 ENCOUNTER — Encounter: Admitting: Family Medicine
